# Patient Record
Sex: Male | Born: 1940 | Race: White | Hispanic: No | Marital: Married | Smoking: Former smoker
Health system: Southern US, Community
[De-identification: ages and names within clinical notes are randomized; demographics above are authoritative.]

## PROBLEM LIST (undated history)

## (undated) DIAGNOSIS — I428 Other cardiomyopathies: Secondary | ICD-10-CM

## (undated) DIAGNOSIS — I350 Nonrheumatic aortic (valve) stenosis: Secondary | ICD-10-CM

## (undated) DIAGNOSIS — I447 Left bundle-branch block, unspecified: Secondary | ICD-10-CM

## (undated) DIAGNOSIS — R000 Tachycardia, unspecified: Secondary | ICD-10-CM

## (undated) DIAGNOSIS — K219 Gastro-esophageal reflux disease without esophagitis: Secondary | ICD-10-CM

## (undated) DIAGNOSIS — Z95810 Presence of automatic (implantable) cardiac defibrillator: Secondary | ICD-10-CM

## (undated) DIAGNOSIS — I5022 Chronic systolic (congestive) heart failure: Secondary | ICD-10-CM

## (undated) DIAGNOSIS — J449 Chronic obstructive pulmonary disease, unspecified: Secondary | ICD-10-CM

## (undated) DIAGNOSIS — Z952 Presence of prosthetic heart valve: Secondary | ICD-10-CM

## (undated) DIAGNOSIS — Z87438 Personal history of other diseases of male genital organs: Secondary | ICD-10-CM

## (undated) DIAGNOSIS — I10 Essential (primary) hypertension: Secondary | ICD-10-CM

## (undated) DIAGNOSIS — E785 Hyperlipidemia, unspecified: Secondary | ICD-10-CM

## (undated) DIAGNOSIS — Z87442 Personal history of urinary calculi: Secondary | ICD-10-CM

## (undated) HISTORY — DX: Tachycardia, unspecified: R00.0

## (undated) HISTORY — DX: Chronic obstructive pulmonary disease, unspecified: J44.9

## (undated) HISTORY — DX: Other cardiomyopathies: I42.8

## (undated) HISTORY — DX: Hyperlipidemia, unspecified: E78.5

## (undated) HISTORY — PX: CARDIAC CATHETERIZATION: SHX172

## (undated) HISTORY — DX: Essential (primary) hypertension: I10

## (undated) HISTORY — PX: LEG SURGERY: SHX1003

## (undated) HISTORY — DX: Personal history of other diseases of male genital organs: Z87.438

## (undated) HISTORY — PX: CATARACT EXTRACTION, BILATERAL: SHX1313

## (undated) HISTORY — DX: Chronic systolic (congestive) heart failure: I50.22

## (undated) HISTORY — DX: Left bundle-branch block, unspecified: I44.7

---

## 2008-06-07 ENCOUNTER — Ambulatory Visit: Payer: Self-pay | Admitting: Gastroenterology

## 2010-09-23 HISTORY — PX: CARDIAC CATHETERIZATION: SHX172

## 2010-12-06 ENCOUNTER — Ambulatory Visit: Payer: Self-pay | Admitting: Internal Medicine

## 2011-09-20 ENCOUNTER — Ambulatory Visit: Payer: Self-pay | Admitting: Unknown Physician Specialty

## 2011-09-25 LAB — PATHOLOGY REPORT

## 2012-09-17 ENCOUNTER — Ambulatory Visit: Payer: Self-pay | Admitting: Nurse Practitioner

## 2013-01-18 ENCOUNTER — Ambulatory Visit: Payer: Self-pay | Admitting: Ophthalmology

## 2013-01-18 LAB — POTASSIUM: Potassium: 3.6 mmol/L (ref 3.5–5.1)

## 2013-01-26 ENCOUNTER — Ambulatory Visit: Payer: Self-pay | Admitting: Ophthalmology

## 2013-03-05 ENCOUNTER — Ambulatory Visit: Payer: Self-pay | Admitting: Ophthalmology

## 2013-03-05 LAB — POTASSIUM: Potassium: 3.5 mmol/L (ref 3.5–5.1)

## 2013-03-10 ENCOUNTER — Ambulatory Visit: Payer: Self-pay | Admitting: Ophthalmology

## 2014-02-27 ENCOUNTER — Inpatient Hospital Stay: Payer: Self-pay | Admitting: Internal Medicine

## 2014-02-27 LAB — TROPONIN I: Troponin-I: <0.02 ng/mL

## 2014-02-27 LAB — BASIC METABOLIC PANEL
ANION GAP: 10 (ref 7–16)
BUN: 15 mg/dL (ref 7–18)
CALCIUM: 9.1 mg/dL (ref 8.5–10.1)
Chloride: 96 mmol/L — ABNORMAL LOW (ref 98–107)
Co2: 28 mmol/L (ref 21–32)
Creatinine: 0.85 mg/dL (ref 0.60–1.30)
EGFR (African American): >60
EGFR (Non-African Amer.): >60
GLUCOSE: 112 mg/dL — AB (ref 65–99)
Osmolality: 270 (ref 275–301)
Potassium: 3.2 mmol/L — ABNORMAL LOW (ref 3.5–5.1)
Sodium: 134 mmol/L — ABNORMAL LOW (ref 136–145)

## 2014-02-27 LAB — CBC WITH DIFFERENTIAL/PLATELET
Basophil #: 0 10*3/uL (ref 0.0–0.1)
Basophil %: 0.5 %
EOS PCT: 0.2 %
Eosinophil #: 0 10*3/uL (ref 0.0–0.7)
HCT: 42.4 % (ref 40.0–52.0)
HGB: 14.3 g/dL (ref 13.0–18.0)
Lymphocyte #: 0.7 10*3/uL — ABNORMAL LOW (ref 1.0–3.6)
Lymphocyte %: 7.2 %
MCH: 30.9 pg (ref 26.0–34.0)
MCHC: 33.8 g/dL (ref 32.0–36.0)
MCV: 91 fL (ref 80–100)
MONOS PCT: 11.6 %
Monocyte #: 1.2 x10 3/mm — ABNORMAL HIGH (ref 0.2–1.0)
Neutrophil #: 8.2 10*3/uL — ABNORMAL HIGH (ref 1.4–6.5)
Neutrophil %: 80.5 %
Platelet: 215 10*3/uL (ref 150–440)
RBC: 4.64 10*6/uL (ref 4.40–5.90)
RDW: 14 % (ref 11.5–14.5)
WBC: 10.1 10*3/uL (ref 3.8–10.6)

## 2014-02-27 LAB — PRO B NATRIURETIC PEPTIDE: B-Type Natriuretic Peptide: 5312 pg/mL — ABNORMAL HIGH (ref 0–125)

## 2014-02-28 LAB — BASIC METABOLIC PANEL
ANION GAP: 8 (ref 7–16)
BUN: 15 mg/dL (ref 7–18)
CALCIUM: 8.7 mg/dL (ref 8.5–10.1)
CHLORIDE: 99 mmol/L (ref 98–107)
CREATININE: 0.87 mg/dL (ref 0.60–1.30)
Co2: 27 mmol/L (ref 21–32)
EGFR (Non-African Amer.): >60
GLUCOSE: 182 mg/dL — AB (ref 65–99)
Osmolality: 274 (ref 275–301)
POTASSIUM: 3.4 mmol/L — AB (ref 3.5–5.1)
Sodium: 134 mmol/L — ABNORMAL LOW (ref 136–145)

## 2014-02-28 LAB — CBC WITH DIFFERENTIAL/PLATELET
Basophil #: 0 10*3/uL (ref 0.0–0.1)
Basophil %: 0 %
Eosinophil #: 0 10*3/uL (ref 0.0–0.7)
Eosinophil %: 0 %
HCT: 38 % — AB (ref 40.0–52.0)
HGB: 12.6 g/dL — AB (ref 13.0–18.0)
LYMPHS ABS: 0.2 10*3/uL — AB (ref 1.0–3.6)
LYMPHS PCT: 3 %
MCH: 30.3 pg (ref 26.0–34.0)
MCHC: 33.1 g/dL (ref 32.0–36.0)
MCV: 92 fL (ref 80–100)
MONO ABS: 0.1 x10 3/mm — AB (ref 0.2–1.0)
Monocyte %: 1 %
NEUTROS ABS: 7.1 10*3/uL — AB (ref 1.4–6.5)
Neutrophil %: 96 %
PLATELETS: 193 10*3/uL (ref 150–440)
RBC: 4.15 10*6/uL — AB (ref 4.40–5.90)
RDW: 14 % (ref 11.5–14.5)
WBC: 7.4 10*3/uL (ref 3.8–10.6)

## 2014-02-28 LAB — LIPID PANEL
CHOLESTEROL: 117 mg/dL (ref 0–200)
HDL Cholesterol: 44 mg/dL (ref 40–60)
LDL CHOLESTEROL, CALC: 60 mg/dL (ref 0–100)
TRIGLYCERIDES: 65 mg/dL (ref 0–200)
VLDL Cholesterol, Calc: 13 mg/dL (ref 5–40)

## 2014-02-28 LAB — MAGNESIUM: Magnesium: 2.2 mg/dL

## 2014-02-28 LAB — TSH: Thyroid Stimulating Horm: 0.171 u[IU]/mL — ABNORMAL LOW

## 2014-03-02 LAB — EXPECTORATED SPUTUM ASSESSMENT W GRAM STAIN, RFLX TO RESP C

## 2014-03-12 ENCOUNTER — Inpatient Hospital Stay: Payer: Self-pay | Admitting: Student

## 2014-03-12 LAB — CBC
HCT: 39.6 % — ABNORMAL LOW (ref 40.0–52.0)
HGB: 13 g/dL (ref 13.0–18.0)
MCH: 30.4 pg (ref 26.0–34.0)
MCHC: 33 g/dL (ref 32.0–36.0)
MCV: 92 fL (ref 80–100)
Platelet: 208 10*3/uL (ref 150–440)
RBC: 4.29 10*6/uL — AB (ref 4.40–5.90)
RDW: 14.4 % (ref 11.5–14.5)
WBC: 10.8 10*3/uL — ABNORMAL HIGH (ref 3.8–10.6)

## 2014-03-12 LAB — CK TOTAL AND CKMB (NOT AT ARMC)
CK, Total: 69 U/L
CK, Total: 56 U/L
CK-MB: 1.9 ng/mL (ref 0.5–3.6)
CK-MB: 1.9 ng/mL (ref 0.5–3.6)

## 2014-03-12 LAB — BASIC METABOLIC PANEL
Anion Gap: 6 — ABNORMAL LOW (ref 7–16)
BUN: 22 mg/dL — ABNORMAL HIGH (ref 7–18)
CHLORIDE: 103 mmol/L (ref 98–107)
Calcium, Total: 8.7 mg/dL (ref 8.5–10.1)
Co2: 29 mmol/L (ref 21–32)
Creatinine: 1.12 mg/dL (ref 0.60–1.30)
Glucose: 122 mg/dL — ABNORMAL HIGH (ref 65–99)
Osmolality: 280 (ref 275–301)
Potassium: 3.7 mmol/L (ref 3.5–5.1)
Sodium: 138 mmol/L (ref 136–145)

## 2014-03-12 LAB — TROPONIN I
Troponin-I: 0.04 ng/mL
Troponin-I: 0.04 ng/mL

## 2014-03-12 LAB — PRO B NATRIURETIC PEPTIDE: B-TYPE NATIURETIC PEPTID: 10232 pg/mL — AB (ref 0–125)

## 2014-03-13 LAB — CBC WITH DIFFERENTIAL/PLATELET
BASOS ABS: 0 10*3/uL (ref 0.0–0.1)
Basophil %: 0.3 %
EOS ABS: 0 10*3/uL (ref 0.0–0.7)
Eosinophil %: 0 %
HCT: 37.8 % — ABNORMAL LOW (ref 40.0–52.0)
HGB: 12.3 g/dL — AB (ref 13.0–18.0)
Lymphocyte #: 0.4 10*3/uL — ABNORMAL LOW (ref 1.0–3.6)
Lymphocyte %: 5.6 %
MCH: 29.8 pg (ref 26.0–34.0)
MCHC: 32.6 g/dL (ref 32.0–36.0)
MCV: 92 fL (ref 80–100)
MONOS PCT: 0.9 %
Monocyte #: 0.1 x10 3/mm — ABNORMAL LOW (ref 0.2–1.0)
Neutrophil #: 6.2 10*3/uL (ref 1.4–6.5)
Neutrophil %: 93.2 %
Platelet: 193 10*3/uL (ref 150–440)
RBC: 4.13 10*6/uL — ABNORMAL LOW (ref 4.40–5.90)
RDW: 14.2 % (ref 11.5–14.5)
WBC: 6.6 10*3/uL (ref 3.8–10.6)

## 2014-03-13 LAB — BASIC METABOLIC PANEL
ANION GAP: 7 (ref 7–16)
BUN: 22 mg/dL — AB (ref 7–18)
CALCIUM: 8.1 mg/dL — AB (ref 8.5–10.1)
CO2: 28 mmol/L (ref 21–32)
Chloride: 103 mmol/L (ref 98–107)
Creatinine: 1.07 mg/dL (ref 0.60–1.30)
EGFR (African American): >60
EGFR (Non-African Amer.): >60
Glucose: 173 mg/dL — ABNORMAL HIGH (ref 65–99)
Osmolality: 283 (ref 275–301)
Potassium: 3.3 mmol/L — ABNORMAL LOW (ref 3.5–5.1)
Sodium: 138 mmol/L (ref 136–145)

## 2014-03-13 LAB — URINALYSIS, COMPLETE
BILIRUBIN, UR: NEGATIVE
BLOOD: NEGATIVE
Glucose,UR: NEGATIVE mg/dL (ref 0–75)
Hyaline Cast: 5
KETONE: NEGATIVE
LEUKOCYTE ESTERASE: NEGATIVE
NITRITE: NEGATIVE
Ph: 5 (ref 4.5–8.0)
Protein: NEGATIVE
RBC,UR: 1 /HPF (ref 0–5)
SPECIFIC GRAVITY: 1.012 (ref 1.003–1.030)
Squamous Epithelial: <1

## 2014-03-13 LAB — TSH: THYROID STIMULATING HORM: 0.455 u[IU]/mL

## 2014-03-13 LAB — CK TOTAL AND CKMB (NOT AT ARMC)
CK, Total: 52 U/L
CK-MB: 2.3 ng/mL (ref 0.5–3.6)

## 2014-03-13 LAB — TROPONIN I: Troponin-I: 0.04 ng/mL

## 2014-03-14 LAB — BASIC METABOLIC PANEL
Anion Gap: 9 (ref 7–16)
BUN: 24 mg/dL — AB (ref 7–18)
CALCIUM: 8.9 mg/dL (ref 8.5–10.1)
Chloride: 100 mmol/L (ref 98–107)
Co2: 29 mmol/L (ref 21–32)
Creatinine: 1.21 mg/dL (ref 0.60–1.30)
GFR CALC NON AF AMER: 59 — AB
Glucose: 152 mg/dL — ABNORMAL HIGH (ref 65–99)
OSMOLALITY: 283 (ref 275–301)
Potassium: 3.7 mmol/L (ref 3.5–5.1)
Sodium: 138 mmol/L (ref 136–145)

## 2014-03-16 LAB — BASIC METABOLIC PANEL
Anion Gap: 8 (ref 7–16)
BUN: 25 mg/dL — AB (ref 7–18)
CHLORIDE: 103 mmol/L (ref 98–107)
CO2: 30 mmol/L (ref 21–32)
Calcium, Total: 9 mg/dL (ref 8.5–10.1)
Creatinine: 0.94 mg/dL (ref 0.60–1.30)
GLUCOSE: 101 mg/dL — AB (ref 65–99)
Osmolality: 286 (ref 275–301)
POTASSIUM: 3.5 mmol/L (ref 3.5–5.1)
SODIUM: 141 mmol/L (ref 136–145)

## 2014-03-16 LAB — PRO B NATRIURETIC PEPTIDE: B-Type Natriuretic Peptide: 12053 pg/mL — ABNORMAL HIGH (ref 0–125)

## 2014-03-17 LAB — CULTURE, BLOOD (SINGLE)

## 2014-03-18 ENCOUNTER — Encounter: Payer: Self-pay | Admitting: *Deleted

## 2014-03-18 LAB — BASIC METABOLIC PANEL
ANION GAP: 7 (ref 7–16)
BUN: 20 mg/dL — ABNORMAL HIGH (ref 7–18)
CHLORIDE: 103 mmol/L (ref 98–107)
Calcium, Total: 8.6 mg/dL (ref 8.5–10.1)
Co2: 31 mmol/L (ref 21–32)
Creatinine: 0.88 mg/dL (ref 0.60–1.30)
EGFR (Non-African Amer.): >60
Glucose: 151 mg/dL — ABNORMAL HIGH (ref 65–99)
Osmolality: 287 (ref 275–301)
Potassium: 4.3 mmol/L (ref 3.5–5.1)
Sodium: 141 mmol/L (ref 136–145)

## 2014-03-18 LAB — MAGNESIUM: Magnesium: 2 mg/dL

## 2014-03-21 ENCOUNTER — Encounter (HOSPITAL_COMMUNITY): Payer: Self-pay | Admitting: Pharmacy Technician

## 2014-03-21 ENCOUNTER — Encounter: Payer: Self-pay | Admitting: Cardiovascular Disease

## 2014-03-21 ENCOUNTER — Ambulatory Visit (INDEPENDENT_AMBULATORY_CARE_PROVIDER_SITE_OTHER): Payer: Medicare Other | Admitting: Cardiovascular Disease

## 2014-03-21 ENCOUNTER — Encounter (INDEPENDENT_AMBULATORY_CARE_PROVIDER_SITE_OTHER): Payer: Self-pay

## 2014-03-21 VITALS — BP 84/70 | HR 109 | Ht 70.0 in | Wt 212.5 lb

## 2014-03-21 DIAGNOSIS — I447 Left bundle-branch block, unspecified: Secondary | ICD-10-CM

## 2014-03-21 DIAGNOSIS — I5022 Chronic systolic (congestive) heart failure: Secondary | ICD-10-CM

## 2014-03-21 DIAGNOSIS — I498 Other specified cardiac arrhythmias: Secondary | ICD-10-CM

## 2014-03-21 DIAGNOSIS — R000 Tachycardia, unspecified: Secondary | ICD-10-CM

## 2014-03-21 DIAGNOSIS — R0602 Shortness of breath: Secondary | ICD-10-CM

## 2014-03-21 MED ORDER — CARVEDILOL 3.125 MG PO TABS: 3.1250 mg | ORAL_TABLET | Freq: Two times a day (BID) | ORAL | Status: DC

## 2014-03-21 MED ORDER — SPIRONOLACTONE 25 MG PO TABS: 25.0000 mg | ORAL_TABLET | Freq: Every day | ORAL | Status: DC

## 2014-03-21 NOTE — Patient Instructions (Addendum)
Please STOP diltiazem  Please start carvedilol 3.125mg  twice daily Please start spironolactone 25mg  once daily  Your physician has requested that you have a cardiac catheterization. Cardiac catheterization is used to diagnose and/or treat various heart conditions. Doctors may recommend this procedure for a number of different reasons. The most common reason is to evaluate chest pain. Chest pain can be a symptom of coronary artery disease (CAD), and cardiac catheterization can show whether plaque is narrowing or blocking your heart's arteries. This procedure is also used to evaluate the valves, as well as measure the blood flow and oxygen levels in different parts of your heart. For further information please visit [email or web address] Please follow instruction sheet, as given.  Please arrive at Short Stay at [facility] at 6:30am on Wednesday, You are scheduled for a Cardiac Catheterization on Wed, July 1 with [name].  Please arrive at the [facility] Entrance "A" of [facility] ([address] [place]) at 6:30am on the day of your procedure.  1. Nothing to eat or drink after midnight. 2. You should take your medication as usual with a sip of water; this includes your aspirin and Plavix / Effient / Brilinta. 3. Plan for one night stay - bring personal belongings (i.e. Toothpaste, toothbrush, etc.). 4. Bring a current list of your medications and current insurance cards. 5. Must have a responsible person to drive you home. 6. Someone must be with you for the first 24 hours after you arrive home. 7. Please wear clothes that are easy to get on and off and wear slip-on shoes.  * Special note:  Every effort is made to have your procedure done on time.  Occasionally there are emergencies that present themselves at the hospital that may cause delays.  Please be patient if a delay does occur.July 1

## 2014-03-22 ENCOUNTER — Encounter: Payer: Self-pay | Admitting: Cardiovascular Disease

## 2014-03-22 DIAGNOSIS — I5022 Chronic systolic (congestive) heart failure: Secondary | ICD-10-CM | POA: Insufficient documentation

## 2014-03-22 DIAGNOSIS — R000 Tachycardia, unspecified: Secondary | ICD-10-CM | POA: Insufficient documentation

## 2014-03-22 DIAGNOSIS — I447 Left bundle-branch block, unspecified: Secondary | ICD-10-CM | POA: Insufficient documentation

## 2014-03-22 LAB — CBC WITH DIFFERENTIAL
BASOS ABS: 0 10*3/uL (ref 0.0–0.2)
Basos: 0 %
Eos: 0 %
Eosinophils Absolute: 0 10*3/uL (ref 0.0–0.4)
HCT: 41.1 % (ref 37.5–51.0)
Hemoglobin: 13.7 g/dL (ref 12.6–17.7)
IMMATURE GRANULOCYTES: 0 %
Immature Grans (Abs): 0 10*3/uL (ref 0.0–0.1)
LYMPHS ABS: 0.7 10*3/uL (ref 0.7–3.1)
Lymphs: 9 %
MCH: 29.8 pg (ref 26.6–33.0)
MCHC: 33.3 g/dL (ref 31.5–35.7)
MCV: 89 fL (ref 79–97)
MONOCYTES: 4 %
Monocytes Absolute: 0.3 10*3/uL (ref 0.1–0.9)
Neutrophils Absolute: 6.8 10*3/uL (ref 1.4–7.0)
Neutrophils Relative %: 87 %
PLATELETS: 244 10*3/uL (ref 150–379)
RBC: 4.6 x10E6/uL (ref 4.14–5.80)
RDW: 14.7 % (ref 12.3–15.4)
WBC: 7.9 10*3/uL (ref 3.4–10.8)

## 2014-03-22 LAB — BASIC METABOLIC PANEL
BUN / CREAT RATIO: 21 (ref 10–22)
BUN: 16 mg/dL (ref 8–27)
CHLORIDE: 98 mmol/L (ref 97–108)
CO2: 25 mmol/L (ref 18–29)
Calcium: 8.7 mg/dL (ref 8.6–10.2)
Creatinine, Ser: 0.76 mg/dL (ref 0.76–1.27)
GFR calc Af Amer: 104 mL/min/{1.73_m2} (ref >59)
GFR calc non Af Amer: 90 mL/min/{1.73_m2} (ref >59)
Glucose: 132 mg/dL — ABNORMAL HIGH (ref 65–99)
Potassium: 5.1 mmol/L (ref 3.5–5.2)
Sodium: 136 mmol/L (ref 134–144)

## 2014-03-22 LAB — PROTIME-INR
INR: 1.1 (ref 0.8–1.2)
Prothrombin Time: 10.9 s (ref 9.1–12.0)

## 2014-03-22 NOTE — Assessment & Plan Note (Signed)
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 I stop diltiazem and added carvedilol. If he does not tolerate carvedilol due to his lung disease, I will consider bisoprolol. The other option would be Ivabradine especially with low blood pressure.

## 2014-03-22 NOTE — Assessment & Plan Note (Addendum)
Mr. 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 Unfortunately, he is currently New [place] Heart Association class IV with symptoms highly suggestive of low cardiac output. I had a prolonged discussion with the patient and his family about the seriousness of his illness. It is very concerning that he is tachycardic and mildly hypotensive. A calcium channel blocker is relatively contraindicated given the degree of LV systolic dysfunction. He is currently on diltiazem. He did not tolerate metoprolol. I stopped diltiazem and added carvedilol 3.125 mg twice daily. I also added spironolactone 25 mg once daily. I recommend urgent evaluation with a right and left cardiac catheterization to exclude coronary artery disease and evaluate filling pressures and cardiac output. Risks and benefits were explained.  He might need to be admitted after the procedure based on his numbers. He might require advanced heart failure treatment. I will consider a heart failure consult.

## 2014-03-22 NOTE — Assessment & Plan Note (Signed)
The patient is likely a candidate for biventricular ICD placement. Ejection fraction was 15% in March. However, he has not been on optimal medical therapy. Given his advanced symptoms though, we might need to expedite this.

## 2014-03-22 NOTE — Progress Notes (Signed)
Primary care physician Dr. Ulanda Edison:   HPI  This is a pleasant 73 year old male who is transferring cardiovascular care from [facility]. He used to see Dr. Gwen Pounds and has seen him for many years initially for sinus tachycardia with moderate LV systolic dysfunction. He had cardiac catheterization done in March of 2012 which showed normal coronary arteries with ejection fraction of 35% and mild aortic stenosis. He had no significant symptoms of heart failure at that time he has known history of COPD and previous tobacco use. Other medical problems include hypertension, hyperlipidemia and recent left bundle branch block. He underwent an echocardiogram in March of this year which showed worsening of LV systolic function with an ejection fraction of 15%. Shortly after that, the patient started experiencing significant shortness of breath and cough. He had 2 recent hospitalization at [facility] for fatigue, dyspnea and cough. He was initially treated for COPD exacerbation. He was started on small dose metoprolol and Lasix but she reports getting worse after that and was hospitalized for hypotension which required treatment with levofed. He was discharged on digoxin and diltiazem to control his sinus tachycardia. He continues to have significant dyspnea at rest with orthopnea and poor sleep at night. He feels that he has no energy and has not been able to do any physical activities.   Allergies  Allergen Reactions  . Lisinopril Hives  . Metoprolol Nausea And Vomiting  . Norvasc [Amlodipine Besylate]     unknown     No current outpatient prescriptions on file prior to visit.   No current facility-administered medications on file prior to visit.     Past Medical History  Diagnosis Date  . History of BPH   . COPD (chronic obstructive pulmonary disease)   . Hypertension   . Hyperlipidemia   . Chronic back pain   . Pneumonia   . Heart murmur   . Chronic systolic heart failure     Ejection fraction of  15%  . Sinus tachycardia   . Left bundle branch block      Past Surgical History  Procedure Laterality Date  . Cardiac catheterization  2012    [facility]     Family History  Problem Relation Age of Onset  . Heart disease Father   . Heart failure Father   . Hypertension Mother   . Hyperlipidemia Mother      History   Social History  . Marital Status: Married    Spouse Name: N/A    Number of Children: N/A  . Years of Education: N/A   Occupational History  . Not on file.   Social History Main Topics  . Smoking status: Former Smoker -- 0 years    Types: Cigarettes  . Smokeless tobacco: Former Neurosurgeon    Types: Snuff  . Alcohol Use: Yes     Comment: occasional  . Drug Use: No  . Sexual Activity: Not on file   Other Topics Concern  . Not on file   Social History Narrative  . No narrative on file     ROS A 10 point review of system was performed. It is negative other than that mentioned in the history of present illness.   PHYSICAL EXAM   BP 84/70  Pulse 109  Ht 5\' 10"  (1.778 m)  Wt 212 lb 8 oz (96.389 kg)  BMI 30.49 kg/m2 Constitutional: He is oriented to person, place, and time. He appears well-developed and well-nourished. No distress.  HENT: No nasal discharge.  Head: Normocephalic  and atraumatic.  Eyes: Pupils are equal and round.  No discharge. Neck: Normal range of motion. Neck supple. No JVD present. No thyromegaly present.  Cardiovascular: Normal rate, regular rhythm, normal heart sounds. Exam reveals no gallop and no friction rub. No murmur heard.  Pulmonary/Chest: Effort normal and breath sounds normal. No stridor. No respiratory distress. He has no wheezes. He has no rales. He exhibits no tenderness.  Abdominal: Soft. Bowel sounds are normal. He exhibits no distension. There is no tenderness. There is no rebound and no guarding.  Musculoskeletal: Normal range of motion. He exhibits no edema and no tenderness.  Neurological: He is alert and oriented  to person, place, and time. Coordination normal.  Skin: Skin is warm and dry. No rash noted. He is not diaphoretic. No erythema. No pallor.  Psychiatric: He has a normal mood and affect. His behavior is normal. Judgment and thought content normal.       EKG:   ASSESSMENT AND PLAN   

## 2014-03-23 ENCOUNTER — Ambulatory Visit (HOSPITAL_COMMUNITY)
Admission: RE | Admit: 2014-03-23 | Discharge: 2014-03-23 | Disposition: A | Discharge status: Home / Self Care | Payer: Medicare Other | Source: Ambulatory Visit | Attending: Cardiovascular Disease | Admitting: Cardiovascular Disease

## 2014-03-23 ENCOUNTER — Encounter (HOSPITAL_COMMUNITY)
Admission: RE | Disposition: A | Discharge status: Home / Self Care | Payer: Self-pay | Source: Ambulatory Visit | Attending: Cardiovascular Disease

## 2014-03-23 DIAGNOSIS — M549 Dorsalgia, unspecified: Secondary | ICD-10-CM | POA: Insufficient documentation

## 2014-03-23 DIAGNOSIS — I447 Left bundle-branch block, unspecified: Secondary | ICD-10-CM | POA: Insufficient documentation

## 2014-03-23 DIAGNOSIS — I428 Other cardiomyopathies: Secondary | ICD-10-CM | POA: Insufficient documentation

## 2014-03-23 DIAGNOSIS — J4489 Other specified chronic obstructive pulmonary disease: Secondary | ICD-10-CM | POA: Insufficient documentation

## 2014-03-23 DIAGNOSIS — I10 Essential (primary) hypertension: Secondary | ICD-10-CM | POA: Insufficient documentation

## 2014-03-23 DIAGNOSIS — R011 Cardiac murmur, unspecified: Secondary | ICD-10-CM | POA: Insufficient documentation

## 2014-03-23 DIAGNOSIS — G8929 Other chronic pain: Secondary | ICD-10-CM | POA: Insufficient documentation

## 2014-03-23 DIAGNOSIS — I509 Heart failure, unspecified: Secondary | ICD-10-CM

## 2014-03-23 DIAGNOSIS — E785 Hyperlipidemia, unspecified: Secondary | ICD-10-CM | POA: Insufficient documentation

## 2014-03-23 DIAGNOSIS — J449 Chronic obstructive pulmonary disease, unspecified: Secondary | ICD-10-CM | POA: Insufficient documentation

## 2014-03-23 DIAGNOSIS — I5022 Chronic systolic (congestive) heart failure: Secondary | ICD-10-CM | POA: Insufficient documentation

## 2014-03-23 DIAGNOSIS — Z87891 Personal history of nicotine dependence: Secondary | ICD-10-CM | POA: Insufficient documentation

## 2014-03-23 HISTORY — PX: LEFT AND RIGHT HEART CATHETERIZATION WITH CORONARY ANGIOGRAM: SHX5449

## 2014-03-23 LAB — POCT I-STAT 3, VENOUS BLOOD GAS (G3P V)
Acid-Base Excess: 4 mmol/L — ABNORMAL HIGH (ref 0.0–2.0)
Bicarbonate: 29.8 mEq/L — ABNORMAL HIGH (ref 20.0–24.0)
O2 Saturation: 68 %
PH VEN: 7.416 — AB (ref 7.250–7.300)
PO2 VEN: 35 mmHg (ref 30.0–45.0)
TCO2: 31 mmol/L (ref 0–100)
pCO2, Ven: 46.3 mmHg (ref 45.0–50.0)

## 2014-03-23 LAB — POCT I-STAT 3, ART BLOOD GAS (G3+)
Acid-Base Excess: 4 mmol/L — ABNORMAL HIGH (ref 0.0–2.0)
Bicarbonate: 29.2 mEq/L — ABNORMAL HIGH (ref 20.0–24.0)
O2 Saturation: 94 %
PH ART: 7.431 (ref 7.350–7.450)
PO2 ART: 70 mmHg — AB (ref 80.0–100.0)
TCO2: 30 mmol/L (ref 0–100)
pCO2 arterial: 43.8 mmHg (ref 35.0–45.0)

## 2014-03-23 SURGERY — LEFT AND RIGHT HEART CATHETERIZATION WITH CORONARY ANGIOGRAM
Anesthesia: LOCAL

## 2014-03-23 MED ORDER — ASPIRIN 81 MG PO CHEW
81.0000 mg | CHEWABLE_TABLET | ORAL | Status: AC
  Administered 2014-03-23: 81 mg via ORAL

## 2014-03-23 MED ORDER — VERAPAMIL HCL 2.5 MG/ML IV SOLN
INTRAVENOUS | Status: AC
  Filled 2014-03-23: qty 2

## 2014-03-23 MED ORDER — ASPIRIN 81 MG PO CHEW
CHEWABLE_TABLET | ORAL | Status: AC
  Filled 2014-03-23: qty 1

## 2014-03-23 MED ORDER — LIDOCAINE HCL (PF) 1 % IJ SOLN
INTRAMUSCULAR | Status: AC
  Filled 2014-03-23: qty 30

## 2014-03-23 MED ORDER — SODIUM CHLORIDE 0.9 % IJ SOLN: 3.0000 mL | Freq: Two times a day (BID) | INTRAMUSCULAR | Status: DC

## 2014-03-23 MED ORDER — SODIUM CHLORIDE 0.9 % IJ SOLN: 3.0000 mL | INTRAMUSCULAR | Status: DC | PRN

## 2014-03-23 MED ORDER — SODIUM CHLORIDE 0.9 % IV SOLN: INTRAVENOUS | Status: AC

## 2014-03-23 MED ORDER — HEPARIN (PORCINE) IN NACL 2-0.9 UNIT/ML-% IJ SOLN
INTRAMUSCULAR | Status: AC
  Filled 2014-03-23: qty 1500

## 2014-03-23 MED ORDER — SODIUM CHLORIDE 0.9 % IV SOLN: 250.0000 mL | INTRAVENOUS | Status: DC | PRN

## 2014-03-23 MED ORDER — HEPARIN SODIUM (PORCINE) 1000 UNIT/ML IJ SOLN
INTRAMUSCULAR | Status: AC
  Filled 2014-03-23: qty 1

## 2014-03-23 MED ORDER — SODIUM CHLORIDE 0.9 % IV SOLN
INTRAVENOUS | Status: DC
  Administered 2014-03-23: 1000 mL via INTRAVENOUS

## 2014-03-23 NOTE — H&P (View-Only) (Signed)
Primary care physician Dr. Ulanda Edison:   HPI  This is a pleasant 73 year old male who is transferring cardiovascular care from [facility]. He used to see Dr. Gwen Pounds and has seen him for many years initially for sinus tachycardia with moderate LV systolic dysfunction. He had cardiac catheterization done in March of 2012 which showed normal coronary arteries with ejection fraction of 35% and mild aortic stenosis. He had no significant symptoms of heart failure at that time he has known history of COPD and previous tobacco use. Other medical problems include hypertension, hyperlipidemia and recent left bundle branch block. He underwent an echocardiogram in March of this year which showed worsening of LV systolic function with an ejection fraction of 15%. Shortly after that, the patient started experiencing significant shortness of breath and cough. He had 2 recent hospitalization at [facility] for fatigue, dyspnea and cough. He was initially treated for COPD exacerbation. He was started on small dose metoprolol and Lasix but she reports getting worse after that and was hospitalized for hypotension which required treatment with levofed. He was discharged on digoxin and diltiazem to control his sinus tachycardia. He continues to have significant dyspnea at rest with orthopnea and poor sleep at night. He feels that he has no energy and has not been able to do any physical activities.   Allergies  Allergen Reactions  . Lisinopril Hives  . Metoprolol Nausea And Vomiting  . Norvasc [Amlodipine Besylate]     unknown     No current outpatient prescriptions on file prior to visit.   No current facility-administered medications on file prior to visit.     Past Medical History  Diagnosis Date  . History of BPH   . COPD (chronic obstructive pulmonary disease)   . Hypertension   . Hyperlipidemia   . Chronic back pain   . Pneumonia   . Heart murmur   . Chronic systolic heart failure     Ejection fraction of  15%  . Sinus tachycardia   . Left bundle branch block      Past Surgical History  Procedure Laterality Date  . Cardiac catheterization  2012    [facility]     Family History  Problem Relation Age of Onset  . Heart disease Father   . Heart failure Father   . Hypertension Mother   . Hyperlipidemia Mother      History   Social History  . Marital Status: Married    Spouse Name: N/A    Number of Children: N/A  . Years of Education: N/A   Occupational History  . Not on file.   Social History Main Topics  . Smoking status: Former Smoker -- 0 years    Types: Cigarettes  . Smokeless tobacco: Former Neurosurgeon    Types: Snuff  . Alcohol Use: Yes     Comment: occasional  . Drug Use: No  . Sexual Activity: Not on file   Other Topics Concern  . Not on file   Social History Narrative  . No narrative on file     ROS A 10 point review of system was performed. It is negative other than that mentioned in the history of present illness.   PHYSICAL EXAM   BP 84/70  Pulse 109  Ht 5\' 10"  (1.778 m)  Wt 212 lb 8 oz (96.389 kg)  BMI 30.49 kg/m2 Constitutional: He is oriented to person, place, and time. He appears well-developed and well-nourished. No distress.  HENT: No nasal discharge.  Head: Normocephalic  and atraumatic.  Eyes: Pupils are equal and round.  No discharge. Neck: Normal range of motion. Neck supple. No JVD present. No thyromegaly present.  Cardiovascular: Normal rate, regular rhythm, normal heart sounds. Exam reveals no gallop and no friction rub. No murmur heard.  Pulmonary/Chest: Effort normal and breath sounds normal. No stridor. No respiratory distress. He has no wheezes. He has no rales. He exhibits no tenderness.  Abdominal: Soft. Bowel sounds are normal. He exhibits no distension. There is no tenderness. There is no rebound and no guarding.  Musculoskeletal: Normal range of motion. He exhibits no edema and no tenderness.  Neurological: He is alert and oriented  to person, place, and time. Coordination normal.  Skin: Skin is warm and dry. No rash noted. He is not diaphoretic. No erythema. No pallor.  Psychiatric: He has a normal mood and affect. His behavior is normal. Judgment and thought content normal.       EKG:   ASSESSMENT AND PLAN

## 2014-03-23 NOTE — CV Procedure (Signed)
   Cardiac Catheterization Procedure Note  Name: Benjamin Frost MRN: [id number] DOB: [birth year]  Procedure: Right Heart Cath, Left Heart Cath, Selective Coronary Angiography, LV angiography  Indication: Congestive heart failure with severely reduced LV systolic function. No sedation was given. Contrast volume: 60 mL      Procedural Details: The pre-existing IV in the right antecubital vein was exchanged under sterile fashion into a 6 French slender sheath. Right heart catheterization was performed with a 5 [place] balloontipped catheter with serial measurement of pressures. Cardiac output was calculated by the Fick method.  The right wrist was prepped, draped, and anesthetized with 1% lidocaine. Using the modified Seldinger technique, a 5 French sheath was introduced into the right radial artery. 3 mg of verapamil was administered through the sheath, weight-based unfractionated heparin was administered intravenously. A Jackie catheter was used for selective coronary angiography. A pigtail catheter was used for left ventriculography. Catheter exchanges were performed over an exchange length guidewire. There were no immediate procedural complications. A TR band was used for radial hemostasis at the completion of the procedure.  The patient was transferred to the post catheterization recovery area for further monitoing   Procedural Findings: Hemodynamics RA 11 mmHg RV 35/4 mmHg PA 35/17 mmHg PCWP 18 mmHg LV 107/5 mmHg . LVEDP: 14 mmHg AO 100/63 mmHg  Oxygen saturations: PA 68% AO 94%  Cardiac Output (Fick) 5.79  Cardiac Index (Fick) 2.74    Pulmonary vascular resistance (PVR): <1 Woods units.  Coronary angiography: Coronary dominance: Right   Left Main:  Normal  Left Anterior Descending (LAD):  Normal in size with no significant disease.   1st diagonal (D1):   Large in size with no significant disease.  2nd diagonal (D2):  Small in size with no significant disease.   3rd  diagonal (D3):  Small in size.   Circumflex (LCx):  Normal in size and nondominant. The vessel has no significant disease.  1st obtuse marginal:  Very small in size.   2nd obtuse marginal:   normal in size with no significant disease.   3rd obtuse marginal:   normal in size with no significant disease.    Ramus Intermedius:   small in size with no significant disease.   Right Coronary Artery: Normal in size and dominant. There are minor irregularities in the midsegment.   Posterior descending artery:  normal in size with no significant disease.   Posterior AV segment:  no significant disease.    Left ventriculography: Left ventricular systolic function is  severely reduced  , LVEF is estimated at  10-15%  %, there is  no significant mitral regurgitation   Final Conclusions:    1. Mildly elevated filling pressures with normal cardiac output.   2. No evidence of obstructive coronary artery disease with only minor irregularities. 3. Severely reduced LV systolic function.  Recommendations:   continue medical therapy for congestive heart failure nonischemic cardiomyopathy.   Lorine Bears MD, [facility] 03/23/2014, 9:38 AM

## 2014-03-23 NOTE — Discharge Instructions (Signed)
Radial Site Care °Refer to this sheet in the next few weeks. These instructions provide you with information on caring for yourself after your procedure. Your caregiver may also give you more specific instructions. Your treatment has been planned according to current medical practices, but problems sometimes occur. Call your caregiver if you have any problems or questions after your procedure. °HOME CARE INSTRUCTIONS °· You may shower the day after the procedure. Remove the bandage (dressing) and gently wash the site with plain soap and water. Gently pat the site dry. °· Do not apply powder or lotion to the site. °· Do not submerge the affected site in water for 3 to 5 days. °· Inspect the site at least twice daily. °· Do not flex or bend the affected arm for 24 hours. °· No lifting over 5 pounds (2.3 kg) for 5 days after your procedure. °· Do not drive home if you are discharged the same day of the procedure. Have someone else drive you. °· You may drive 24 hours after the procedure unless otherwise instructed by your caregiver. °· Do not operate machinery or power tools for 24 hours. °· A responsible adult should be with you for the first 24 hours after you arrive home. °What to expect: °· Any bruising will usually fade within 1 to 2 weeks. °· Blood that collects in the tissue (hematoma) may be painful to the touch. It should usually decrease in size and tenderness within 1 to 2 weeks. °SEEK IMMEDIATE MEDICAL CARE IF: °· You have unusual pain at the radial site. °· You have redness, warmth, swelling, or pain at the radial site. °· You have drainage (other than a small amount of blood on the dressing). °· You have chills. °· You have a fever or persistent symptoms for more than 72 hours. °· You have a fever and your symptoms suddenly get worse. °· Your arm becomes pale, cool, tingly, or numb. °· You have heavy bleeding from the site. Hold pressure on the site. °Document Released: 10/12/2010 Document Revised:  12/02/2011 Document Reviewed: 10/12/2010 °ExitCare® Patient Information ©2015 [employer]. This information is not intended to replace advice given to you by your health care provider. Make sure you discuss any questions you have with your health care provider. ° °

## 2014-03-23 NOTE — Interval H&P Note (Signed)
Cath Lab Visit (complete for each Cath Lab visit)  Clinical Evaluation Leading to the Procedure:   ACS: No.  Non-ACS:    Anginal Classification: CCS IV  Anti-ischemic medical therapy: Minimal Therapy (1 class of medications)  Non-Invasive Test Results: No non-invasive testing performed  Prior CABG: No previous CABG      History and Physical Interval Note:  03/23/2014 8:50 AM  Benjamin Frost  has presented today for surgery, with the diagnosis of shortness of breath  The various methods of treatment have been discussed with the patient and family. After consideration of risks, benefits and other options for treatment, the patient has consented to  Procedure(s): LEFT AND RIGHT HEART CATHETERIZATION WITH CORONARY ANGIOGRAM (N/A) as a surgical intervention .  The patient's history has been reviewed, patient examined, no change in status, stable for surgery.  I have reviewed the patient's chart and labs.  Questions were answered to the patient's satisfaction.     Lorine Bears

## 2014-03-29 ENCOUNTER — Encounter: Payer: Self-pay | Admitting: Cardiovascular Disease

## 2014-03-29 ENCOUNTER — Ambulatory Visit (INDEPENDENT_AMBULATORY_CARE_PROVIDER_SITE_OTHER): Payer: Medicare Other | Admitting: Cardiovascular Disease

## 2014-03-29 ENCOUNTER — Other Ambulatory Visit: Payer: Self-pay | Admitting: Cardiovascular Disease

## 2014-03-29 VITALS — BP 93/66 | HR 100 | Ht 70.0 in | Wt 191.8 lb

## 2014-03-29 DIAGNOSIS — R000 Tachycardia, unspecified: Secondary | ICD-10-CM

## 2014-03-29 DIAGNOSIS — I498 Other specified cardiac arrhythmias: Secondary | ICD-10-CM

## 2014-03-29 DIAGNOSIS — I5022 Chronic systolic (congestive) heart failure: Secondary | ICD-10-CM

## 2014-03-29 DIAGNOSIS — I447 Left bundle-branch block, unspecified: Secondary | ICD-10-CM

## 2014-03-29 LAB — BASIC METABOLIC PANEL
Anion Gap: 3 — ABNORMAL LOW (ref 7–16)
BUN: 16 mg/dL (ref 7–18)
CHLORIDE: 105 mmol/L (ref 98–107)
CO2: 31 mmol/L (ref 21–32)
Calcium, Total: 9.2 mg/dL (ref 8.5–10.1)
Creatinine: 0.89 mg/dL (ref 0.60–1.30)
Glucose: 85 mg/dL (ref 65–99)
OSMOLALITY: 278 (ref 275–301)
Potassium: 4.4 mmol/L (ref 3.5–5.1)
Sodium: 139 mmol/L (ref 136–145)

## 2014-03-29 LAB — DIGOXIN LEVEL: Digoxin: 1.15 ng/mL

## 2014-03-29 NOTE — Assessment & Plan Note (Signed)
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 Filling pressures were only mildly elevated. Thus, no need for a loop diuretic at this time especially with his baseline hypotension. I am not able to up titrate his medications due to hypotension. Check basic metabolic profile given the recent addition of spironolactone. I will also check digoxin level and consider decreasing the dose.

## 2014-03-29 NOTE — Assessment & Plan Note (Signed)
I suspect that he will most likely require an ICD CRT placement. Until then, I recommend an external defibrillator given his degree of LV systolic dysfunction, intermittent sinus tachycardia, hypertension and left bundle branch block. I believe he is at high risk for ventricular arrhythmia.

## 2014-03-29 NOTE — Patient Instructions (Signed)
Your physician recommends that you return for lab work in:  Please take lab order to [facility] for labs today   Your physician recommends that you schedule a follow-up appointment in:  1 month with [name]  We will call you this week with instructions for your life vest.

## 2014-03-29 NOTE — Progress Notes (Signed)
Primary care physician Dr. Ulanda Edison:   HPI  This is a pleasant 73 year old male who is here today for a followup visit regarding chronic systolic heart failure with severely reduced LV systolic function and an underlying left bundle branch block.  He used to see Dr. Gwen Pounds and has seen him for many years initially for sinus tachycardia with moderate LV systolic dysfunction. He had cardiac catheterization done in March of 2012 which showed normal coronary arteries with ejection fraction of 35% and mild aortic stenosis. He had no significant symptoms of heart failure at that time.  He has known history of COPD and previous tobacco use. Other medical problems include hypertension, hyperlipidemia and  left bundle branch block. He underwent an echocardiogram in March of this year which showed worsening of LV systolic function with an ejection fraction of 15%. Shortly after that, the patient started experiencing significant shortness of breath and cough. He had 2 recent hospitalization at [facility] for fatigue, dyspnea and cough. He was initially treated for COPD exacerbation. I proceeded with a right left cardiac catheterization last week which showed only mildly elevated filling pressures with normal cardiac output and no significant pulmonary hypertension. Coronary angiography showed no obstructive coronary artery disease. Ejection fraction was 10-15%. He was started on small dose carvedilol and spironolactone. He is overall feeling better.   Allergies  Allergen Reactions  . Lisinopril Hives  . Metoprolol Nausea And Vomiting  . Norvasc [Amlodipine Besylate]     unknown     Current Outpatient Prescriptions on File Prior to Visit  Medication Sig Dispense Refill  . albuterol (PROAIR HFA) 108 (90 BASE) MCG/ACT inhaler Inhale 2 puffs into the lungs every 6 (six) hours as needed for wheezing or shortness of breath.      Marland Kitchen aspirin 81 MG tablet Take 81 mg by mouth daily.      Marland Kitchen atorvastatin (LIPITOR) 40  MG tablet Take 40 mg by mouth daily.      . carvedilol (COREG) 3.125 MG tablet Take 1 tablet (3.125 mg total) by mouth 2 (two) times daily.  60 tablet  6  . digoxin (LANOXIN) 0.25 MG tablet Take 0.25 mg by mouth daily.      . Fluticasone-Salmeterol (ADVAIR) 250-50 MCG/DOSE AEPB Inhale 1 puff into the lungs 2 (two) times daily.      Marland Kitchen losartan (COZAAR) 25 MG tablet Takes 1/4 of pill daily.      Marland Kitchen omeprazole (PRILOSEC) 20 MG capsule Take 20 mg by mouth daily.      Marland Kitchen spironolactone (ALDACTONE) 25 MG tablet Take 1 tablet (25 mg total) by mouth daily.  30 tablet  6  . tamsulosin (FLOMAX) 0.4 MG CAPS capsule Take 0.4 mg by mouth daily.      Marland Kitchen tiotropium (SPIRIVA) 18 MCG inhalation capsule Place 18 mcg into inhaler and inhale daily.      . vitamin C (ASCORBIC ACID) 500 MG tablet Take 500 mg by mouth daily.       No current facility-administered medications on file prior to visit.     Past Medical History  Diagnosis Date  . History of BPH   . COPD (chronic obstructive pulmonary disease)   . Hypertension   . Hyperlipidemia   . Chronic back pain   . Pneumonia   . Heart murmur   . Chronic systolic heart failure     Ejection fraction of 15%  . Sinus tachycardia   . Left bundle branch block      Past Surgical  History  Procedure Laterality Date  . Cardiac [facility]  2012    [facility]  [facility]     Family History  Problem Relation Age of Onset  . Heart disease Father   . Heart failure Father   . Hypertension Mother   . Hyperlipidemia Mother      History   Social History  . Marital Status: Married    Spouse Name: N/A    Number of Children: N/A  . Years of Education: N/A   Occupational History  . Not on file.   Social History Main Topics  . Smoking status: Former Smoker -- 0 years    Types: Cigarettes  . Smokeless tobacco: Former NeurosurgeonUser    Types: Snuff  . Alcohol Use: Yes     Comment: occasional  . Drug Use: No  . Sexual Activity: Not on file    Other Topics Concern  . Not on file   Social History Narrative  . No narrative on file     ROS A 10 point review of system was performed. It is negative other than that mentioned in the history of present illness.   PHYSICAL EXAM   BP 93/66  Pulse 100  Ht 5\' 10"  (1.778 m)  Wt 191 lb 12 oz (86.977 kg)  BMI 27.51 kg/m2 Constitutional: He is oriented to person, place, and time. He appears well-developed and well-nourished. No distress.  HENT: No nasal discharge.  Head: Normocephalic and atraumatic.  Eyes: Pupils are equal and round.  No discharge. Neck: Normal range of motion. Neck supple. No JVD present. No thyromegaly present.  Cardiovascular: Normal rate, regular rhythm, normal heart sounds. Exam reveals no gallop and no friction rub. No murmur heard.  Pulmonary/Chest: Effort normal and breath sounds normal. No stridor. No respiratory distress. He has no wheezes. He has no rales. He exhibits no tenderness.  Abdominal: Soft. Bowel sounds are normal. He exhibits no distension. There is no tenderness. There is no rebound and no guarding.  Musculoskeletal: Normal range of motion. He exhibits no edema and no tenderness.  Neurological: He is alert and oriented to person, place, and time. Coordination normal.  Skin: Skin is warm and dry. No rash noted. He is not diaphoretic. No erythema. No pallor.  Psychiatric: He has a normal mood and affect. His behavior is normal. Judgment and thought content normal.       ZOX:[id number]:Sinus  Rhythm  -Left bundle branch block.   ABNORMAL    ASSESSMENT AND PLAN

## 2014-03-29 NOTE — Assessment & Plan Note (Signed)
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 This improved with small dose carvedilol. The dose cannot be increased due to low blood pressure. The other option would be Ivabradine which I will consider.

## 2014-03-31 ENCOUNTER — Telehealth: Payer: Self-pay | Admitting: *Deleted

## 2014-03-31 MED ORDER — DIGOXIN 125 MCG PO TABS: 0.1250 mg | ORAL_TABLET | Freq: Every day | ORAL | Status: DC

## 2014-03-31 NOTE — Telephone Encounter (Signed)
Informed patient that per [name] his digoxin level was elevated  Decrease digoxin to 0.125 mg once daily  Patient verbalized understanding   Faxed life vest order to [facility]  Patient instructed to call if he does not hear from [facility] within 48 hrs

## 2014-04-04 ENCOUNTER — Encounter: Payer: Self-pay | Admitting: Cardiovascular Disease

## 2014-04-07 ENCOUNTER — Telehealth: Payer: Self-pay | Admitting: *Deleted

## 2014-04-07 ENCOUNTER — Emergency Department: Payer: Self-pay | Admitting: Emergency Medicine

## 2014-04-07 LAB — URINALYSIS, COMPLETE
BILIRUBIN, UR: NEGATIVE
BLOOD: NEGATIVE
Glucose,UR: NEGATIVE mg/dL (ref 0–75)
Leukocyte Esterase: NEGATIVE
NITRITE: NEGATIVE
PH: 5 (ref 4.5–8.0)
Protein: NEGATIVE
RBC,UR: 4 /HPF (ref 0–5)
SPECIFIC GRAVITY: 1.018 (ref 1.003–1.030)
WBC UR: 3 /HPF (ref 0–5)

## 2014-04-07 LAB — COMPREHENSIVE METABOLIC PANEL
ALBUMIN: 3.3 g/dL — AB (ref 3.4–5.0)
ALK PHOS: 67 U/L
ANION GAP: 6 — AB (ref 7–16)
BILIRUBIN TOTAL: 0.8 mg/dL (ref 0.2–1.0)
BUN: 9 mg/dL (ref 7–18)
CO2: 28 mmol/L (ref 21–32)
CREATININE: 1.17 mg/dL (ref 0.60–1.30)
Calcium, Total: 8.9 mg/dL (ref 8.5–10.1)
Chloride: 101 mmol/L (ref 98–107)
EGFR (African American): >60
EGFR (Non-African Amer.): >60
Glucose: 130 mg/dL — ABNORMAL HIGH (ref 65–99)
OSMOLALITY: 271 (ref 275–301)
Potassium: 4.6 mmol/L (ref 3.5–5.1)
SGOT(AST): 35 U/L (ref 15–37)
SGPT (ALT): 36 U/L (ref 12–78)
Sodium: 135 mmol/L — ABNORMAL LOW (ref 136–145)
Total Protein: 7 g/dL (ref 6.4–8.2)

## 2014-04-07 LAB — CBC WITH DIFFERENTIAL/PLATELET
BASOS PCT: 0.5 %
Basophil #: 0 10*3/uL (ref 0.0–0.1)
Eosinophil #: 0 10*3/uL (ref 0.0–0.7)
Eosinophil %: 0.5 %
HCT: 42 % (ref 40.0–52.0)
HGB: 13.8 g/dL (ref 13.0–18.0)
LYMPHS ABS: 0.8 10*3/uL — AB (ref 1.0–3.6)
LYMPHS PCT: 10.9 %
MCH: 30 pg (ref 26.0–34.0)
MCHC: 32.9 g/dL (ref 32.0–36.0)
MCV: 91 fL (ref 80–100)
Monocyte #: 0.4 x10 3/mm (ref 0.2–1.0)
Monocyte %: 6.1 %
NEUTROS PCT: 82 %
Neutrophil #: 5.9 10*3/uL (ref 1.4–6.5)
PLATELETS: 165 10*3/uL (ref 150–440)
RBC: 4.61 10*6/uL (ref 4.40–5.90)
RDW: 15.1 % — ABNORMAL HIGH (ref 11.5–14.5)
WBC: 7.2 10*3/uL (ref 3.8–10.6)

## 2014-04-07 NOTE — Telephone Encounter (Signed)
FYI

## 2014-04-07 NOTE — Telephone Encounter (Signed)
Patient's wife called and he is at [facility] ED with Kidney stones in severe pain. She just wanted [place] to know.

## 2014-05-02 ENCOUNTER — Ambulatory Visit (INDEPENDENT_AMBULATORY_CARE_PROVIDER_SITE_OTHER): Payer: Medicare Other | Admitting: Cardiovascular Disease

## 2014-05-02 ENCOUNTER — Encounter: Payer: Self-pay | Admitting: Cardiovascular Disease

## 2014-05-02 VITALS — BP 100/82 | HR 107 | Ht 70.0 in | Wt 196.2 lb

## 2014-05-02 DIAGNOSIS — I498 Other specified cardiac arrhythmias: Secondary | ICD-10-CM

## 2014-05-02 DIAGNOSIS — I447 Left bundle-branch block, unspecified: Secondary | ICD-10-CM

## 2014-05-02 DIAGNOSIS — I5022 Chronic systolic (congestive) heart failure: Secondary | ICD-10-CM

## 2014-05-02 DIAGNOSIS — R000 Tachycardia, unspecified: Secondary | ICD-10-CM

## 2014-05-02 MED ORDER — IVABRADINE HCL 5 MG PO TABS: 5.0000 mg | ORAL_TABLET | Freq: Two times a day (BID) | ORAL | Status: DC

## 2014-05-02 NOTE — Assessment & Plan Note (Addendum)
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 Filling pressures were only mildly elevated. I am not able to up titrate his medications due to hypotension. The dose of digoxin was decreased given that the level was above 1. He continues to be in [place] Heart Association class III. He is having significant weight loss which I suspect is likely due to his cardiac status. I will consider resuming small dose losartan in the near future.

## 2014-05-02 NOTE — Progress Notes (Signed)
Primary care physician Dr. Ulanda Edison:   HPI  This is a pleasant 73 year old male who is here today for a followup visit regarding chronic systolic heart failure with severely reduced LV systolic function and an underlying left bundle branch block.  He used to see Dr. Gwen Pounds and has seen him for many years initially for sinus tachycardia with moderate LV systolic dysfunction. He had cardiac catheterization done in March of 2012 which showed normal coronary arteries with ejection fraction of 35% and mild aortic stenosis. He had no significant symptoms of heart failure at that time.  He has known history of COPD and previous tobacco use. Other medical problems include hypertension, hyperlipidemia and  left bundle branch block. He underwent an echocardiogram in March of this year which showed worsening of LV systolic function with an ejection fraction of 15%. Shortly after that, the patient started experiencing significant shortness of breath and cough. He had 2 recent hospitalization at [facility] for fatigue, dyspnea and cough. He was initially treated for COPD exacerbation. I proceeded with a right left cardiac catheterization  which showed only mildly elevated filling pressures with normal cardiac output and no significant pulmonary hypertension. Coronary angiography showed no obstructive coronary artery disease. Ejection fraction was 10-15%. He was started on small dose carvedilol and spironolactone. I lifevest was placed. He continues to have dyspnea with minimal activities. No orthopnea or PND. He continues to be tachycardic.  Allergies  Allergen Reactions  . Lisinopril Hives  . Metoprolol Nausea And Vomiting  . Norvasc [Amlodipine Besylate]     unknown     Current Outpatient Prescriptions on File Prior to Visit  Medication Sig Dispense Refill  . albuterol (PROAIR HFA) 108 (90 BASE) MCG/ACT inhaler Inhale 2 puffs into the lungs every 6 (six) hours as needed for wheezing or shortness of breath.       Marland Kitchen aspirin 81 MG tablet Take 81 mg by mouth daily.      Marland Kitchen atorvastatin (LIPITOR) 40 MG tablet Take 40 mg by mouth daily.      . carvedilol (COREG) 3.125 MG tablet Take 1 tablet (3.125 mg total) by mouth 2 (two) times daily.  60 tablet  6  . digoxin (LANOXIN) 0.125 MG tablet Take 1 tablet (0.125 mg total) by mouth daily.  90 tablet  3  . Fluticasone-Salmeterol (ADVAIR) 250-50 MCG/DOSE AEPB Inhale 1 puff into the lungs 2 (two) times daily.      Marland Kitchen losartan (COZAAR) 25 MG tablet Takes 1/4 of pill daily.      Marland Kitchen omeprazole (PRILOSEC) 20 MG capsule Take 20 mg by mouth daily.      Marland Kitchen spironolactone (ALDACTONE) 25 MG tablet Take 1 tablet (25 mg total) by mouth daily.  30 tablet  6  . tamsulosin (FLOMAX) 0.4 MG CAPS capsule Take 0.4 mg by mouth daily.      Marland Kitchen tiotropium (SPIRIVA) 18 MCG inhalation capsule Place 18 mcg into inhaler and inhale daily.      . vitamin C (ASCORBIC ACID) 500 MG tablet Take 500 mg by mouth daily.       No current facility-administered medications on file prior to visit.     Past Medical History  Diagnosis Date  . History of BPH   . COPD (chronic obstructive pulmonary disease)   . Hypertension   . Hyperlipidemia   . Chronic back pain   . Pneumonia   . Heart murmur   . Chronic systolic heart failure     Ejection fraction of 15%  .  Sinus tachycardia   . Left bundle branch block   . Kidney stone      Past Surgical History  Procedure Laterality Date  . Cardiac catheterization  2012    [facility]  . Cardiac catheter[facility]      [facility]     Family History  Problem Relation Age of Onset  . Heart disease Father   . Heart failure Father   . Hypertension Mother   . Hyperlipidemia Mother      History   Social History  . Marital Status: Married    Spouse Name: N/A    Number of Children: N/A  . Years of Education: N/A   Occupational History  . Not on file.   Social History Main Topics  . Smoking status: Former Smoker -- 0 years    Types: Cigarettes  .  Smokeless tobacco: Former NeurosurgeonUser    Types: Snuff  . Alcohol Use: Yes     Comment: occasional  . Drug Use: No  . Sexual Activity: Not on file   Other Topics Concern  . Not on file   Social History Narrative  . No narrative on file     ROS A 10 point review of system was performed. It is negative other than that mentioned in the history of present illness.   PHYSICAL EXAM   BP 100/82  Pulse 107  Ht 5\' 10"  (1.778 m)  Wt 196 lb 4 oz (89.018 kg)  BMI 28.16 kg/m2 Constitutional: He is oriented to person, place, and time. He appears well-developed and well-nourished. No distress.  HENT: No nasal discharge.  Head: Normocephalic and atraumatic.  Eyes: Pupils are equal and round.  No discharge. Neck: Normal range of motion. Neck supple. No JVD present. No thyromegaly present.  Cardiovascular: Tachycardic, regular rhythm, normal heart sounds. Exam reveals no gallop and no friction rub. No murmur heard.  Pulmonary/Chest: Effort normal and breath sounds normal. No stridor. No respiratory distress. He has no wheezes. He has no rales. He exhibits no tenderness.  Abdominal: Soft. Bowel sounds are normal. He exhibits no distension. There is no tenderness. There is no rebound and no guarding.  Musculoskeletal: Normal range of motion. He exhibits no edema and no tenderness.  Neurological: He is alert and oriented to person, place, and time. Coordination normal.  Skin: Skin is warm and dry. No rash noted. He is not diaphoretic. No erythema. No pallor.  Psychiatric: He has a normal mood and affect. His behavior is normal. Judgment and thought content normal.       WUJ:[id number]:Sinus  Tachycardia  - occasional ectopic ventricular beat    -Left bundle branch block.   ABNORMAL     ASSESSMENT AND PLAN

## 2014-05-02 NOTE — Patient Instructions (Signed)
Your physician has recommended you make the following change in your medication:  Losartan has been stopped  Start Corlanor 5 mg twice daily   Your physician recommends that you schedule a follow-up appointment in:  2 weeks

## 2014-05-02 NOTE — Assessment & Plan Note (Signed)
Continued sinus tachycardia is concerning as the etiology of his cardiomyopathy might be tachycardia induced. I started treatment today with  Ivabradine 5 mg twice daily. He is to followup in 2 weeks for up titration.

## 2014-05-02 NOTE — Assessment & Plan Note (Signed)
The patient would benefit from ICD/CRT and will be referred to Dr. Graciela Husbands in the near future.

## 2014-05-20 ENCOUNTER — Encounter: Payer: Self-pay | Admitting: Cardiovascular Disease

## 2014-05-20 ENCOUNTER — Ambulatory Visit (INDEPENDENT_AMBULATORY_CARE_PROVIDER_SITE_OTHER): Payer: Medicare Other | Admitting: Cardiovascular Disease

## 2014-05-20 VITALS — BP 100/78 | HR 86 | Ht 70.0 in | Wt 193.5 lb

## 2014-05-20 DIAGNOSIS — R000 Tachycardia, unspecified: Secondary | ICD-10-CM

## 2014-05-20 DIAGNOSIS — I447 Left bundle-branch block, unspecified: Secondary | ICD-10-CM

## 2014-05-20 DIAGNOSIS — R0602 Shortness of breath: Secondary | ICD-10-CM

## 2014-05-20 DIAGNOSIS — I498 Other specified cardiac arrhythmias: Secondary | ICD-10-CM

## 2014-05-20 DIAGNOSIS — I5022 Chronic systolic (congestive) heart failure: Secondary | ICD-10-CM

## 2014-05-20 NOTE — Assessment & Plan Note (Signed)
If an ICD is needed, he will require CRT as well as he is in [place] Heart Association class III.

## 2014-05-20 NOTE — Progress Notes (Signed)
Primary care physician Dr. Ulanda Edison:   HPI  This is a pleasant 73 year old male who is here today for a followup visit regarding chronic systolic heart failure with severely reduced LV systolic function and an underlying left bundle branch block.  He used to see Dr. Gwen Pounds and has seen him for many years initially for sinus tachycardia with moderate LV systolic dysfunction. He had cardiac catheterization done in March of 2012 which showed normal coronary arteries with ejection fraction of 35% and mild aortic stenosis. He had no significant symptoms of heart failure at that time.  He has known history of COPD and previous tobacco use. Other medical problems include hypertension, hyperlipidemia and  left bundle branch block. He underwent an echocardiogram in March of this year which showed worsening of LV systolic function with an ejection fraction of 15%. Shortly after that, the patient started experiencing significant shortness of breath and cough. He had 2 recent hospitalization at [facility] for fatigue, dyspnea and cough. He was initially treated for COPD exacerbation. I proceeded with a right left cardiac catheterization in July which showed only mildly elevated filling pressures with normal cardiac output and no significant pulmonary hypertension. Coronary angiography showed no obstructive coronary artery disease. Ejection fraction was 10-15%. He was started on small dose carvedilol and spironolactone. I lifevest was placed.   he continued to have sinus tachycardia and could not advance the dose of carvedilol due to hypotension. I added Ivabradine 5 mg twice daily the last visit. He reports significant improvement in tachycardia and symptoms.   Allergies  Allergen Reactions  . Lisinopril Hives  . Metoprolol Nausea And Vomiting  . Norvasc [Amlodipine Besylate]     unknown     Current Outpatient Prescriptions on File Prior to Visit  Medication Sig Dispense Refill  . albuterol (PROAIR HFA)  108 (90 BASE) MCG/ACT inhaler Inhale 2 puffs into the lungs every 6 (six) hours as needed for wheezing or shortness of breath.      Marland Kitchen aspirin 81 MG tablet Take 81 mg by mouth daily.      Marland Kitchen atorvastatin (LIPITOR) 40 MG tablet Take 40 mg by mouth daily.      . carvedilol (COREG) 3.125 MG tablet Take 1 tablet (3.125 mg total) by mouth 2 (two) times daily.  60 tablet  6  . digoxin (LANOXIN) 0.125 MG tablet Take 1 tablet (0.125 mg total) by mouth daily.  90 tablet  3  . Fluticasone-Salmeterol (ADVAIR) 250-50 MCG/DOSE AEPB Inhale 1 puff into the lungs 2 (two) times daily.      . ivabradine HCl (CORLANOR) 5 MG TABS tablet Take 1 tablet (5 mg total) by mouth 2 (two) times daily with a meal.  60 tablet  6  . omeprazole (PRILOSEC) 20 MG capsule Take 20 mg by mouth daily.      Marland Kitchen spironolactone (ALDACTONE) 25 MG tablet Take 1 tablet (25 mg total) by mouth daily.  30 tablet  6  . tamsulosin (FLOMAX) 0.4 MG CAPS capsule Take 0.4 mg by mouth daily.      Marland Kitchen tiotropium (SPIRIVA) 18 MCG inhalation capsule Place 18 mcg into inhaler and inhale daily.      . vitamin C (ASCORBIC ACID) 500 MG tablet Take 500 mg by mouth daily.       No current facility-administered medications on file prior to visit.     Past Medical History  Diagnosis Date  . History of BPH   . COPD (chronic obstructive pulmonary disease)   .  Hypertension   . Hyperlipidemia   . Chronic back pain   . Pneumonia   . Heart murmur   . Chronic systolic heart failure     Ejection fraction of 15%  . Sinus tachycardia   . Left bundle branch block   . Kidney stone      Past Surgical History  Procedure Laterality Date  . Cardiac catheterization  2012    [facility]  . Cardiac catheterization      [facility]     Family History  Problem Relation Age of Onset  . Heart disease Father   . Heart failure Father   . Hypertension Mother   . Hyperlipidemia Mother      History   Social History  . Marital Status: Married    Spouse Name: N/A    Number of  Children: N/A  . Years of Education: N/A   Occupational History  . Not on file.   Social History Main Topics  . Smoking status: Former Smoker -- 0 years    Types: Cigarettes  . Smokeless tobacco: Former Neurosurgeon    Types: Snuff  . Alcohol Use: Yes     Comment: occasional  . Drug Use: No  . Sexual Activity: Not on file   Other Topics Concern  . Not on file   Social History Narrative  . No narrative on file     ROS A 10 point review of system was performed. It is negative other than that mentioned in the history of present illness.   PHYSICAL EXAM   BP 100/78  Ht  (1.778 m)  Wt 193 lb 8 oz (87.771 kg)  BMI 27.76 kg/m2 Constitutional: He is oriented to person, place, and time. He appears well-developed and well-nourished. No distress.  HENT: No nasal discharge.  Head: Normocephalic and atraumatic.  Eyes: Pupils are equal and round.  No discharge. Neck: Normal range of motion. Neck supple. No JVD present. No thyromegaly present.  Cardiovascular: Tachycardic, regular rhythm, normal heart sounds. Exam reveals no gallop and no friction rub. No murmur heard.  Pulmonary/Chest: Effort normal and breath sounds normal. No stridor. No respiratory distress. He has no wheezes. He has no rales. He exhibits no tenderness.  Abdominal: Soft. Bowel sounds are normal. He exhibits no distension. There is no tenderness. There is no rebound and no guarding.  Musculoskeletal: Normal range of motion. He exhibits no edema and no tenderness.  Neurological: He is alert and oriented to person, place, and time. Coordination normal.  Skin: Skin is warm and dry. No rash noted. He is not diaphoretic. No erythema. No pallor.  Psychiatric: He has a normal mood and affect. His behavior is normal. Judgment and thought content normal.       GNF:AOZHY  Rhythm  -Left bundle branch block.   ABNORMAL      ASSESSMENT AND PLAN

## 2014-05-20 NOTE — Patient Instructions (Signed)
Your physician recommends that you have labs today: BMP Dig level    Continue your Corlanor   Your physician has requested that you have an echocardiogram the first week of October. Echocardiography is a painless test that uses sound waves to create images of your heart. It provides your doctor with information about the size and shape of your heart and how well your heart's chambers and valves are working. This procedure takes approximately one hour. There are no restrictions for this procedure.   Your physician recommends that you schedule a follow-up appointment in:  The second week of October with [name]

## 2014-05-20 NOTE — Assessment & Plan Note (Signed)
Sinus tachycardia improved significantly after the addition of Ivabradine. I am not planning to increase the dose further due to the presence of left bundle branch block. Otherwise, he is on optimal medical therapy and appears to be euvolemic. Not able to increase  medications further due to hypotension. I will repeat echocardiogram in October to determine the need for ICD.

## 2014-05-21 LAB — BASIC METABOLIC PANEL
BUN / CREAT RATIO: 15 (ref 10–22)
BUN: 12 mg/dL (ref 8–27)
CO2: 22 mmol/L (ref 18–29)
CREATININE: 0.8 mg/dL (ref 0.76–1.27)
Calcium: 9.3 mg/dL (ref 8.6–10.2)
Chloride: 101 mmol/L (ref 97–108)
GFR calc non Af Amer: 87 mL/min/{1.73_m2} (ref >59)
GFR, EST AFRICAN AMERICAN: 101 mL/min/{1.73_m2} (ref >59)
Glucose: 99 mg/dL (ref 65–99)
POTASSIUM: 4 mmol/L (ref 3.5–5.2)
SODIUM: 140 mmol/L (ref 134–144)

## 2014-05-21 LAB — DIGOXIN LEVEL: Digoxin Level: 0.6 ng/mL — ABNORMAL LOW (ref 0.9–2.0)

## 2014-05-25 ENCOUNTER — Telehealth: Payer: Self-pay | Admitting: *Deleted

## 2014-05-25 NOTE — Telephone Encounter (Signed)
Corlanor insurance verification formed faxed

## 2014-05-27 ENCOUNTER — Telehealth: Payer: Self-pay

## 2014-05-27 ENCOUNTER — Telehealth: Payer: Self-pay | Admitting: *Deleted

## 2014-05-27 NOTE — Telephone Encounter (Signed)
Spoke with patient and medication rep Med rep is going to contact the pharmacy and call me Tuesday  Patient aware

## 2014-05-27 NOTE — Telephone Encounter (Signed)
Pt has questions regarding a medication card he picked up today.

## 2014-05-27 NOTE — Telephone Encounter (Signed)
Informed patient that Corlanor has been approved and his co pay card is at the front desk for pick up

## 2014-05-27 NOTE — Telephone Encounter (Signed)
Called regarding new medication [name] was putting him on. Has question.

## 2014-05-31 NOTE — Telephone Encounter (Signed)
Rep to bring samples 9/8

## 2014-05-31 NOTE — Telephone Encounter (Signed)
See other phone note from the same day

## 2014-06-01 ENCOUNTER — Telehealth: Payer: Self-pay | Admitting: *Deleted

## 2014-06-01 NOTE — Telephone Encounter (Signed)
LVM to let patient know rep is bringing samples 06/02/14

## 2014-06-01 NOTE — Telephone Encounter (Signed)
Please call patient regarding Corlanor samples.

## 2014-06-02 NOTE — Telephone Encounter (Signed)
Corlaner amples left at front desk for patient  Patient is aware

## 2014-06-28 ENCOUNTER — Other Ambulatory Visit: Payer: Self-pay

## 2014-06-28 ENCOUNTER — Other Ambulatory Visit (INDEPENDENT_AMBULATORY_CARE_PROVIDER_SITE_OTHER): Payer: Medicare Other

## 2014-06-28 DIAGNOSIS — I5022 Chronic systolic (congestive) heart failure: Secondary | ICD-10-CM

## 2014-06-28 DIAGNOSIS — R0602 Shortness of breath: Secondary | ICD-10-CM

## 2014-07-05 ENCOUNTER — Ambulatory Visit (INDEPENDENT_AMBULATORY_CARE_PROVIDER_SITE_OTHER): Payer: Medicare Other | Admitting: Cardiovascular Disease

## 2014-07-05 ENCOUNTER — Encounter: Payer: Self-pay | Admitting: Cardiovascular Disease

## 2014-07-05 VITALS — BP 100/68 | HR 61 | Ht 70.0 in | Wt 193.5 lb

## 2014-07-05 DIAGNOSIS — I5022 Chronic systolic (congestive) heart failure: Secondary | ICD-10-CM

## 2014-07-05 DIAGNOSIS — I447 Left bundle-branch block, unspecified: Secondary | ICD-10-CM

## 2014-07-05 DIAGNOSIS — R000 Tachycardia, unspecified: Secondary | ICD-10-CM

## 2014-07-05 DIAGNOSIS — I471 Supraventricular tachycardia: Secondary | ICD-10-CM

## 2014-07-05 NOTE — Assessment & Plan Note (Addendum)
Sinus tachycardia improved significantly after the addition of Ivabradine.  He is not on ACE inhibitors/ARB due to low blood pressure.  Repeat echocardiogram this month showed an ejection fraction of 15-20% with moderate aortic stenosis.  I am referring him to EP for an ICD placement. He continues to wear a lifevest.

## 2014-07-05 NOTE — Patient Instructions (Addendum)
You have been referred to one of our Electrophysiology Doctors. Please see Dr. Graciela Husbands here or any of our EP doctors in [place] as soon as possible.    Your physician recommends that you continue on your current medications as directed. Please refer to the Current Medication list given to you today.  Your physician recommends that you schedule a follow-up appointment in:  3 months with [name]   Your next appointment will be scheduled in our new office located at :  [facility]  [address]  [place], [place] [address]

## 2014-07-05 NOTE — Assessment & Plan Note (Signed)
Surprisingly, QRS has narrowed after improving his tachycardia. He might still require CRT.

## 2014-07-05 NOTE — Assessment & Plan Note (Signed)
This has resolved with digoxin and Corlanor.

## 2014-07-05 NOTE — Progress Notes (Signed)
Primary care physician Dr. Ulanda EdisonEmma Williams:   HPI  This is a pleasant 73 year old male who is here today for a followup visit regarding chronic systolic heart failure with severely reduced LV systolic function and an underlying left bundle branch block.  He used to see Dr. Gwen PoundsKowalski and has seen him for many years initially for sinus tachycardia with moderate LV systolic dysfunction. He had cardiac catheterization done in March of 2012 which showed normal coronary arteries with ejection fraction of 35% and mild aortic stenosis. He had no significant symptoms of heart failure at that time.  He has known history of COPD and previous tobacco use. Other medical problems include hypertension, hyperlipidemia and  left bundle branch block. He underwent an echocardiogram in March of this year which showed worsening of LV systolic function with an ejection fraction of 15%. Shortly after that, the patient started experiencing significant shortness of breath and cough with required 2 hospitalization [facility]. I proceeded with a right left cardiac catheterization in July which showed only mildly elevated filling pressures with normal cardiac output and no significant pulmonary hypertension. Coronary angiography showed no obstructive coronary artery disease. Ejection fraction was 10-15%. He was started on small dose carvedilol and spironolactone. A lifevest was placed.  He continued to have sinus tachycardia and could not advance the dose of carvedilol due to hypotension. I added Ivabradine 5 mg twice daily the last visit. He had significant and gradual improvement since then. We have not been able to use an ACE inhibitor due to low blood pressure.   Allergies  Allergen Reactions  . Lisinopril Hives  . Metoprolol Nausea And Vomiting  . Norvasc [Amlodipine Besylate]     unknown     Current Outpatient Prescriptions on File Prior to Visit  Medication Sig Dispense Refill  . albuterol (PROAIR HFA) 108 (90 BASE)  MCG/ACT inhaler Inhale 2 puffs into the lungs every 6 (six) hours as needed for wheezing or shortness of breath.      Marland Kitchen. aspirin 81 MG tablet Take 81 mg by mouth daily.      Marland Kitchen. atorvastatin (LIPITOR) 40 MG tablet Take 40 mg by mouth daily.      . carvedilol (COREG) 3.125 MG tablet Take 1 tablet (3.125 mg total) by mouth 2 (two) times daily.  60 tablet  6  . digoxin (LANOXIN) 0.125 MG tablet Take 1 tablet (0.125 mg total) by mouth daily.  90 tablet  3  . Fluticasone-Salmeterol (ADVAIR) 250-50 MCG/DOSE AEPB Inhale 1 puff into the lungs 2 (two) times daily.      . ivabradine HCl (CORLANOR) 5 MG TABS tablet Take 1 tablet (5 mg total) by mouth 2 (two) times daily with a meal.  60 tablet  6  . omeprazole (PRILOSEC) 20 MG capsule Take 20 mg by mouth daily.      Marland Kitchen. spironolactone (ALDACTONE) 25 MG tablet Take 1 tablet (25 mg total) by mouth daily.  30 tablet  6  . tamsulosin (FLOMAX) 0.4 MG CAPS capsule Take 0.4 mg by mouth daily.      Marland Kitchen. tiotropium (SPIRIVA) 18 MCG inhalation capsule Place 18 mcg into inhaler and inhale daily.      . vitamin C (ASCORBIC ACID) 500 MG tablet Take 500 mg by mouth daily.       No current facility-administered medications on file prior to visit.     Past Medical History  Diagnosis Date  . History of BPH   . COPD (chronic obstructive pulmonary disease)   .  Hypertension   . Hyperlipidemia   . Chronic back pain   . Pneumonia   . Heart murmur   . Chronic systolic heart failure     Ejection fraction of 15%  . Sinus tachycardia   . Left bundle branch block   . Kidney stone      Past Surgical History  Procedure Laterality Date  . Cardiac catheterization  2012    [facility]  . Cardiac catheterization      [facility]     Family History  Problem Relation Age of Onset  . Heart disease Father   . Heart failure Father   . Hypertension Mother   . Hyperlipidemia Mother      History   Social History  . Marital Status: Married    Spouse Name: N/A    Number of Children: N/A   . Years of Education: N/A   Occupational History  . Not on file.   Social History Main Topics  . Smoking status: Former Smoker -- 0 years    Types: Cigarettes  . Smokeless tobacco: Former Neurosurgeon    Types: Snuff  . Alcohol Use: Yes     Comment: occasional  . Drug Use: No  . Sexual Activity: Not on file   Other Topics Concern  . Not on file   Social History Narrative  . No narrative on file     ROS A 10 point review of system was performed. It is negative other than that mentioned in the history of present illness.   PHYSICAL EXAM   BP 100/68  Pulse 61  Ht 5\' 10"  (1.778 m)  Wt 193 lb 8 oz (87.771 kg)  BMI 27.76 kg/m2 Constitutional: He is oriented to person, place, and time. He appears well-developed and well-nourished. No distress.  HENT: No nasal discharge.  Head: Normocephalic and atraumatic.  Eyes: Pupils are equal and round.  No discharge. Neck: Normal range of motion. Neck supple. No JVD present. No thyromegaly present.  Cardiovascular: Normal rate, regular rhythm, normal heart sounds. Exam reveals no gallop and no friction rub. There is a 3/6 systolic ejection murmur in the aortic area Pulmonary/Chest: Effort normal and breath sounds normal. No stridor. No respiratory distress. He has no wheezes. He has no rales. He exhibits no tenderness.  Abdominal: Soft. Bowel sounds are normal. He exhibits no distension. There is no tenderness. There is no rebound and no guarding.  Musculoskeletal: Normal range of motion. He exhibits no edema and no tenderness.  Neurological: He is alert and oriented to person, place, and time. Coordination normal.  Skin: Skin is warm and dry. No rash noted. He is not diaphoretic. No erythema. No pallor.  Psychiatric: He has a normal mood and affect. His behavior is normal. Judgment and thought content normal.       JGG:EZMOQ  Rhythm  -Nonspecific ST depression   +   Diffuse nonspecific T-abnormality  -Nondiagnostic.  Incomplete left  bundle branch block  ABNORMAL      ASSESSMENT AND PLAN

## 2014-07-11 ENCOUNTER — Telehealth: Payer: Self-pay | Admitting: *Deleted

## 2014-07-21 ENCOUNTER — Ambulatory Visit (INDEPENDENT_AMBULATORY_CARE_PROVIDER_SITE_OTHER): Payer: Medicare Other | Admitting: Internal Medicine

## 2014-07-21 ENCOUNTER — Encounter: Payer: Self-pay | Admitting: Internal Medicine

## 2014-07-21 VITALS — BP 104/70 | HR 73 | Ht 69.0 in | Wt 192.1 lb

## 2014-07-21 DIAGNOSIS — I5022 Chronic systolic (congestive) heart failure: Secondary | ICD-10-CM

## 2014-07-21 DIAGNOSIS — I447 Left bundle-branch block, unspecified: Secondary | ICD-10-CM

## 2014-07-21 DIAGNOSIS — R000 Tachycardia, unspecified: Secondary | ICD-10-CM

## 2014-07-21 DIAGNOSIS — I471 Supraventricular tachycardia: Secondary | ICD-10-CM

## 2014-07-21 NOTE — Progress Notes (Addendum)
Primary care physician Dr. Ulanda Edison:  Primary Cardiologist: [name] Referring MD: [name]    73 year old male who is here today for EP consultation regarding risks of sudden death.   He has chronic systolic heart failure with severely reduced LV systolic function and an underlying left bundle branch block.. He had cardiac catheterization done in March of 2012 which showed normal coronary arteries with ejection fraction of 35% and mild aortic stenosis. He had no significant symptoms of heart failure at that time.  He has known history of COPD and previous tobacco use. Other medical problems include hypertension, hyperlipidemia and  left bundle branch block. He underwent an echocardiogram in March of this year which showed worsening of LV systolic function with an ejection fraction of 15%. Shortly after that, the patient started experiencing significant shortness of breath and cough with required 2 hospitalization at [facility].  He has chronic sinus tachycardia. He had a right left cardiac catheterization in July which showed only mildly elevated filling pressures with normal cardiac output and no significant pulmonary hypertension. Coronary angiography showed no obstructive coronary artery disease. Ejection fraction was 10-15%. He was started on small dose carvedilol and spironolactone. A lifevest was placed.  He was in acute decompensated CHF at the time. He continued to have sinus tachycardia and could not advance the dose of carvedilol due to hypotension.  He was evaluated by Dr Kirke Corin and initiated on Ivabradine 5 mg twice daily 8/15.  He had significant and gradual improvement since then.  ACE inhibitor unable to be used due to low blood pressure. He is feeling much better on above regimen of medications.   Presently, he is pleased with his health state.  He is much more active.  His concern today is with wearing the lifevest.  He finds that this restricts/ reduces his quality of life.      Allergies  Allergen Reactions  . Lisinopril Hives    Hives   . Metoprolol Nausea And Vomiting    Nausea and vomiting   . Norvasc [Amlodipine Besylate] Other (See Comments)    unknown     Current Outpatient Prescriptions on File Prior to Visit  Medication Sig Dispense Refill  . albuterol (PROAIR HFA) 108 (90 BASE) MCG/ACT inhaler Inhale 2 puffs into the lungs every 6 (six) hours as needed for wheezing or shortness of breath.      Marland Kitchen aspirin 81 MG tablet Take 81 mg by mouth daily.      Marland Kitchen atorvastatin (LIPITOR) 40 MG tablet Take 40 mg by mouth daily.      . carvedilol (COREG) 3.125 MG tablet Take 1 tablet (3.125 mg total) by mouth 2 (two) times daily.  60 tablet  6  . digoxin (LANOXIN) 0.125 MG tablet Take 1 tablet (0.125 mg total) by mouth daily.  90 tablet  3  . Fluticasone-Salmeterol (ADVAIR) 250-50 MCG/DOSE AEPB Inhale 1 puff into the lungs 2 (two) times daily.      . ivabradine HCl (CORLANOR) 5 MG TABS tablet Take 1 tablet (5 mg total) by mouth 2 (two) times daily with a meal.  60 tablet  6  . omeprazole (PRILOSEC) 20 MG capsule Take 20 mg by mouth daily.      Marland Kitchen spironolactone (ALDACTONE) 25 MG tablet Take 1 tablet (25 mg total) by mouth daily.  30 tablet  6  . tamsulosin (FLOMAX) 0.4 MG CAPS capsule Take 0.4 mg by mouth daily.      Marland Kitchen tiotropium (SPIRIVA) 18 MCG inhalation  capsule Place 18 mcg into inhaler and inhale daily.      . vitamin C (ASCORBIC ACID) 500 MG tablet Take 500 mg by mouth daily.       No current facility-administered medications on file prior to visit.     Past Medical History  Diagnosis Date  . History of BPH   . COPD (chronic obstructive pulmonary disease)   . Hypertension   . Hyperlipidemia   . Chronic back pain   . Pneumonia   . Heart murmur   . Chronic systolic heart failure     Ejection fraction of 15%  . Sinus tachycardia   . Left bundle branch block   . Kidney stone      Past Surgical History  Procedure Laterality Date  . Cardiac  catheterization  2012    [facility]  . [facility]      [facility]     Family History  Problem Relation Age of Onset  . Heart disease Father   . Heart failure Father   . Hypertension Mother   . Hyperlipidemia Mother      History   Social History  . Marital Status: Married    Spouse Name: N/A    Number of Children: N/A  . Years of Education: N/A   Occupational History  . Not on file.   Social History Main Topics  . Smoking status: Former Smoker -- 0 years    Types: Cigarettes  . Smokeless tobacco: Former NeurosurgeonUser    Types: Snuff  . Alcohol Use: Yes     Comment: occasional  . Drug Use: No  . Sexual Activity: Not on file   Other Topics Concern  . Not on file   Social History Narrative  . No narrative on file     ROS A 10 point review of system was performed. It is negative other than that mentioned in the history of present illness.   PHYSICAL EXAM   BP 104/70  Pulse 73  Ht 5\' 9"  (1.753 m)  Wt 192 lb 1.9 oz (87.145 kg)  BMI 28.36 kg/m2 Constitutional: He is oriented to person, place, and time. He appears well-developed and well-nourished. No distress.  HENT: No nasal discharge.  Head: Normocephalic and atraumatic.  Eyes: Pupils are equal and round.  No discharge. Neck: Normal range of motion. Neck supple. No JVD present. No thyromegaly present.  Cardiovascular: Normal rate, regular rhythm, normal heart sounds. Exam reveals no gallop and no friction rub. There is a 3/6 systolic ejection murmur in the aortic area Pulmonary/Chest: Effort normal and breath sounds normal. No stridor. No respiratory distress. He has no wheezes. He has no rales. He exhibits no tenderness.  Abdominal: Soft. Bowel sounds are normal. He exhibits no distension. There is no tenderness. There is no rebound and no guarding.  Musculoskeletal: Normal range of motion. He exhibits no edema and no tenderness.  Neurological: He is alert and oriented to person, place, and time. Coordination  normal.  Skin: Skin is warm and dry. No rash noted. He is not diaphoretic. No erythema. No pallor.  Psychiatric: He has a normal mood and affect. His behavior is normal. Judgment and thought content normal.   ZOX:WRUEAEKG:Sinus  Rhythm 61 bpm, PR 176, QRS 104 msec, nonspecific ST/T changes  Echo 06/28/14 :Left ventricle: The cavity size was severely dilated. Wall thickness was normal. Systolic function was severely reduced. The estimated ejection fraction was in the range of 15% to 20%. Diffuse hypokinesis. - Aortic valve: There was  moderate stenosis. There was mild regurgitation. Mean gradient (S): 19 mm Hg. Valve area (VTI): 0.75 cm^2. Although low gradient low output aortic stenosis can not be excluded, the degree of valve opening is consistent with moderate and not severe AS. - Mitral valve: There was mild to moderate regurgitation. - Left atrium: The atrium was mildly dilated.  Epic records were reviewed at length Over 50 pages of faxed medical records from [place] were reviewed today I also spoke with Dr Kirke Corin at length about the patients clinical condition.  ASSESSMENT AND PLAN  1. 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  He has a h/o longstanding sinus tachycardia and LBBB as well.  He has been recently placed on ivabridine by Dr Kirke Corin and has make remarkable improvement since that time.  His sinus tachycardia is resolved and his LBBB has resolved.  He has had remarkable improvement in CHF symptoms as well. I had a long discussion with the patient and his spouse today.  Given his profound clinical improvement.  I think that at this point it is advisable to continue medical optimization and repeat echo in 3 additional months.  If his EF remains < 35% then we should proceed with ICD implant.  I am hopeful however that his EF may recover. He is clear that he would like to return his LifeVest to the company at this time.  2. Sinus tachycardia Resolved with  ivabradine  Follow-up with Dr Kirke Corin in 3 months I will see again only should his repeat EF be <35% for ICD discussion.

## 2014-07-21 NOTE — Patient Instructions (Addendum)
Your physician has requested that you have an echocardiogram in 3 months ([place]). Echocardiography is a painless test that uses sound waves to create images of your heart. It provides your doctor with information about the size and shape of your heart and how well your heart's chambers and valves are working. This procedure takes approximately one hour. There are no restrictions for this procedure.  Your physician recommends that you schedule a follow-up appointment in: 3 months with [name] [facility]) Your next appointment will be scheduled in our new office located at : ______________ [facility]  [address]  [place], [place] [address]  You may take off your Life Vest now.

## 2014-09-01 ENCOUNTER — Encounter (HOSPITAL_COMMUNITY): Payer: Self-pay | Admitting: Cardiovascular Disease

## 2014-10-07 ENCOUNTER — Ambulatory Visit: Payer: Medicare Other | Admitting: Cardiovascular Disease

## 2014-10-12 ENCOUNTER — Telehealth: Payer: Self-pay | Admitting: Cardiovascular Disease

## 2014-10-12 NOTE — Telephone Encounter (Signed)
Placed samples of Corlanor 5 mg @ front desk for pick up.

## 2014-10-12 NOTE — Telephone Encounter (Signed)
Patients pharmacy is out of stock until tomorrow (weather permitting deliver).  Patient would like samples of Corlenor is available.  Please call patient.

## 2014-10-16 ENCOUNTER — Other Ambulatory Visit: Payer: Self-pay | Admitting: Cardiovascular Disease

## 2014-10-18 ENCOUNTER — Encounter: Payer: Self-pay | Admitting: Cardiovascular Disease

## 2014-10-18 ENCOUNTER — Other Ambulatory Visit (INDEPENDENT_AMBULATORY_CARE_PROVIDER_SITE_OTHER): Payer: Medicare Other

## 2014-10-18 ENCOUNTER — Ambulatory Visit (INDEPENDENT_AMBULATORY_CARE_PROVIDER_SITE_OTHER): Payer: Medicare Other | Admitting: Cardiovascular Disease

## 2014-10-18 ENCOUNTER — Other Ambulatory Visit: Payer: Self-pay

## 2014-10-18 VITALS — BP 122/80 | HR 73 | Ht 69.0 in | Wt 202.0 lb

## 2014-10-18 DIAGNOSIS — I5022 Chronic systolic (congestive) heart failure: Secondary | ICD-10-CM

## 2014-10-18 DIAGNOSIS — I350 Nonrheumatic aortic (valve) stenosis: Secondary | ICD-10-CM

## 2014-10-18 MED ORDER — LOSARTAN POTASSIUM 25 MG PO TABS: 25.0000 mg | ORAL_TABLET | Freq: Every day | ORAL | Status: DC

## 2014-10-18 NOTE — Progress Notes (Signed)
Primary care physician Dr. 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 He underwent an echocardiogram in March of 2015 which showed worsening of LV systolic function with an ejection fraction of 15%. Shortly after that, the patient started experiencing significant shortness of breath and cough with required 2 hospitalization at [facility]. I proceeded with a right left cardiac catheterization in July which showed only mildly elevated filling pressures with normal cardiac output and no significant pulmonary hypertension. Coronary angiography showed no obstructive coronary artery disease. Ejection fraction was 10-15%.  He continued to have sinus tachycardia and could not advance the dose of carvedilol due to hypotension. I added Ivabradine 5 mg twice daily the last visit. He had significant and gradual improvement since then. He feels significantly better.   Allergies  Allergen Reactions  . Lisinopril Hives    Hives   . Metoprolol Nausea And Vomiting    Nausea and vomiting   . Norvasc [Amlodipine Besylate] Other (See Comments)    unknown     Current Outpatient Prescriptions on File Prior to Visit  Medication Sig Dispense Refill  . albuterol (PROAIR HFA) 108 (90 BASE) MCG/ACT inhaler Inhale 2 puffs into the lungs every 6 (six) hours as needed for wheezing or shortness of breath.    Marland Kitchen aspirin 81 MG tablet Take 81 mg by mouth daily.    Marland Kitchen atorvastatin (LIPITOR) 40 MG tablet Take 40 mg by mouth daily.    . carvedilol (COREG) 3.125 MG tablet TAKE 1 TABLET BY MOUTH 2 TIMES A DAY 60 tablet 3  . digoxin (LANOXIN) 0.125 MG tablet Take 1 tablet (0.125 mg total) by mouth daily. 90 tablet 3  . Fluticasone-Salmeterol (ADVAIR) 250-50 MCG/DOSE AEPB Inhale 1 puff into the lungs 2 (two) times daily.    . ivabradine HCl (CORLANOR) 5  MG TABS tablet Take 1 tablet (5 mg total) by mouth 2 (two) times daily with a meal. 60 tablet 6  . omeprazole (PRILOSEC) 20 MG capsule Take 20 mg by mouth daily.    Marland Kitchen spironolactone (ALDACTONE) 25 MG tablet TAKE 1 TABLET BY MOUTH ONCE A DAY 30 tablet 3  . tamsulosin (FLOMAX) 0.4 MG CAPS capsule Take 0.4 mg by mouth daily.    Marland Kitchen tiotropium (SPIRIVA) 18 MCG inhalation capsule Place 18 mcg into inhaler and inhale daily.    . vitamin C (ASCORBIC ACID) 500 MG tablet Take 500 mg by mouth daily.     No current facility-administered medications on file prior to visit.     Past Medical History  Diagnosis Date  . History of BPH   . COPD (chronic obstructive pulmonary disease)   . Hypertension   . Hyperlipidemia   . Chronic back pain   . Pneumonia   . Heart murmur   . Chronic systolic heart failure     Ejection fraction of 15%  . Sinus tachycardia   . Left bundle branch block   . Kidney stone      Past Surgical History  Procedure Laterality Date  . Cardiac catheterization  2012    [facility]  . Cardiac catheterization      [facility]  . Left and right heart catheterization with coronary angiogram N/A 03/23/2014    Procedure: LEFT AND RIGHT HEART CATHETERIZATION WITH CORONARY ANGIOGRAM;  Surgeon: [name], MD;  Location: [facility] CATH LAB;  Service: Cardiovascular;  Laterality: N/A;     Family History  Problem Relation Age of Onset  . Heart disease Father   . Heart failure Father   . Hypertension Mother   . Hyperlipidemia Mother      History   Social History  . Marital Status: Married    Spouse Name: N/A    Number of Children: N/A  . Years of Education: N/A   Occupational History  . Not on file.   Social History Main Topics  . Smoking status: Former Smoker -- 0 years    Types: Cigarettes  . Smokeless tobacco: Former Neurosurgeon    Types: Snuff  . Alcohol Use: Yes     Comment: occasional  . Drug Use: No  . Sexual Activity: Not on file   Other Topics Concern  . Not on file    Social History Narrative     ROS A 10 point review of system was performed. It is negative other than that mentioned in the history of present illness.   PHYSICAL EXAM   BP 122/80 mmHg  Pulse 73  Ht 5\' 9"  (1.753 m)  Wt 202 lb (91.627 kg)  BMI 29.82 kg/m2 Constitutional: He is oriented to person, place, and time. He appears well-developed and well-nourished. No distress.  HENT: No nasal discharge.  Head: Normocephalic and atraumatic.  Eyes: Pupils are equal and round.  No discharge. Neck: Normal range of motion. Neck supple. No JVD present. No thyromegaly present.  Cardiovascular: Normal rate, regular rhythm, normal heart sounds. Exam reveals no gallop and no friction rub. There is a 3/6 systolic ejection murmur in the aortic area Pulmonary/Chest: Effort normal and breath sounds normal. No stridor. No respiratory distress. He has no wheezes. He has no rales. He exhibits no tenderness.  Abdominal: Soft. Bowel sounds are normal. He exhibits no distension. There is no tenderness. There is no rebound and no guarding.  Musculoskeletal: Normal range of motion. He exhibits no edema and no tenderness.  Neurological: He is alert and oriented to person, place, and time. Coordination normal.  Skin: Skin is warm and dry. No rash noted. He is not diaphoretic. No erythema. No pallor.  Psychiatric: He has a normal mood and affect. His behavior is normal. Judgment and thought content normal.          ASSESSMENT AND PLAN

## 2014-10-18 NOTE — Patient Instructions (Signed)
Your physician has recommended you make the following change in your medication: Start Losartan 25 mg once daily   Your physician recommends that you return for lab work in:  BMP in 1 week   Your physician recommends that you schedule a follow-up appointment in:  3 months

## 2014-10-21 DIAGNOSIS — I350 Nonrheumatic aortic (valve) stenosis: Secondary | ICD-10-CM | POA: Insufficient documentation

## 2014-10-21 NOTE — Assessment & Plan Note (Signed)
He appears to be euvolemic and currently New York Heart Association class II. Repeat echocardiogram today showed an ejection fraction of 30-35% with significant improvement in left ventricular end-diastolic dimension. I discussed with him the indication for an ICD placement as this was already addressed by [name] as well. Given the improvement in his symptoms and ejection fraction, the patient actually prefers to wait with longer medical therapy before making this decision. I added losartan today. Check basic metabolic profile in one week. He is otherwise on optimal medical therapy.

## 2014-10-21 NOTE — Assessment & Plan Note (Signed)
This was moderate on most recent echocardiogram. 

## 2014-10-25 ENCOUNTER — Other Ambulatory Visit (INDEPENDENT_AMBULATORY_CARE_PROVIDER_SITE_OTHER): Payer: Medicare Other

## 2014-10-25 DIAGNOSIS — I5022 Chronic systolic (congestive) heart failure: Secondary | ICD-10-CM

## 2014-10-26 LAB — BASIC METABOLIC PANEL
BUN/Creatinine Ratio: 16 (ref 10–22)
BUN: 13 mg/dL (ref 8–27)
CO2: 22 mmol/L (ref 18–29)
Calcium: 9.1 mg/dL (ref 8.6–10.2)
Chloride: 101 mmol/L (ref 97–108)
Creatinine, Ser: 0.81 mg/dL (ref 0.76–1.27)
GFR, EST AFRICAN AMERICAN: 101 mL/min/{1.73_m2} (ref >59)
GFR, EST NON AFRICAN AMERICAN: 87 mL/min/{1.73_m2} (ref >59)
Glucose: 83 mg/dL (ref 65–99)
POTASSIUM: 4.2 mmol/L (ref 3.5–5.2)
Sodium: 139 mmol/L (ref 134–144)

## 2014-10-26 NOTE — Telephone Encounter (Signed)
Prior auth for [employer] to [employer]

## 2014-10-28 ENCOUNTER — Telehealth: Payer: Self-pay | Admitting: *Deleted

## 2014-10-28 NOTE — Telephone Encounter (Signed)
Informed patient the paperwork for his Corlanor was sent yesterday 2/4  Patient verbalized understanding

## 2014-10-28 NOTE — Telephone Encounter (Signed)
Pt is calling stating that he received a letter stating that he was denied coverage on Carvedilol.  Please advise

## 2014-11-02 ENCOUNTER — Telehealth: Payer: Self-pay | Admitting: *Deleted

## 2014-11-02 NOTE — Telephone Encounter (Signed)
Patient has been approved for Corlanor starting 10/20/2014-09/23/15.

## 2014-11-03 ENCOUNTER — Telehealth: Payer: Self-pay | Admitting: *Deleted

## 2014-11-03 NOTE — Telephone Encounter (Signed)
Patient informed that he was approved for Corlanor  Patient verbalized understanding

## 2014-11-21 ENCOUNTER — Other Ambulatory Visit: Payer: Self-pay | Admitting: *Deleted

## 2014-11-21 MED ORDER — LOSARTAN POTASSIUM 25 MG PO TABS: 25.0000 mg | ORAL_TABLET | Freq: Every day | ORAL | Status: DC

## 2015-01-13 NOTE — Op Note (Signed)
PATIENT NAME:  Benjamin Frost, Benjamin Frost MR#:  [id number] DATE OF BIRTH:  1941-06-15  DATE OF PROCEDURE:  01/26/2013  PREOPERATIVE DIAGNOSIS: Visually significant cataract of the left eye.   POSTOPERATIVE DIAGNOSIS: Visually significant cataract of the left eye.   OPERATIVE PROCEDURE: Cataract extraction by phacoemulsification with implant of intraocular lens to left eye.   SURGEON: Galen Manila, MD.   ANESTHESIA:  1. Managed anesthesia care.  2. Topical tetracaine drops followed by 2% Xylocaine jelly applied in the preoperative holding area.   COMPLICATIONS: None.   TECHNIQUE:  Stop and chop.    DESCRIPTION OF PROCEDURE: The patient was examined and consented in the preoperative holding area where the aforementioned topical anesthesia was applied to the left eye and then brought back to the Operating Room where the left eye was prepped and draped in the usual sterile ophthalmic fashion and a lid speculum was placed. A paracentesis was created with the side port blade and the anterior chamber was filled with viscoelastic. A near clear corneal incision was performed with the steel keratome. A continuous curvilinear capsulorrhexis was performed with a cystotome followed by the capsulorrhexis forceps. Hydrodissection and hydrodelineation were carried out with BSS on a blunt cannula. The lens was removed in a stop and chop technique and the remaining cortical material was removed with the irrigation-aspiration handpiece. The capsular bag was inflated with viscoelastic and the Tecnis ZCB00 21.0-diopter lens, serial number [id number], was placed in the capsular bag without complication. The remaining viscoelastic was removed from the eye with the irrigation-aspiration handpiece. The wounds were hydrated. The anterior chamber was flushed with Miostat and the eye was inflated to physiologic pressure. 0.1 mL of cefuroxime concentration 10 mg/mL was placed in the anterior chamber. The wounds were found to be  water tight. The eye was dressed with Vigamox. The patient was given protective glasses to wear throughout the day and a shield with which to sleep tonight. The patient was also given drops with which to begin a drop regimen today and will follow-up with me in one day.    ____________________________ Jerilee Field. Pansie Guggisberg, MD wlp:gb D: 01/26/2013 19:36:07 ET T: 01/27/2013 04:22:45 ET JOB#: [id number]  cc: Iviana Blasingame L. Sully Dyment, MD, <Dictator> Jerilee Field Renatta Shrieves MD ELECTRONICALLY SIGNED 02/01/2013 14:35

## 2015-01-13 NOTE — Op Note (Signed)
PATIENT NAME:  Benjamin Frost, Benjamin Frost MR#:  [id number] DATE OF BIRTH:  19-Oct-1940  DATE OF PROCEDURE:  03/10/2013  PREOPERATIVE DIAGNOSIS: Visually significant cataract of the right eye.   POSTOPERATIVE DIAGNOSIS: Visually significant cataract of the right eye.   OPERATIVE PROCEDURE: Cataract extraction by phacoemulsification with implant of intraocular lens to right eye.   SURGEON: Galen Manila, MD   ANESTHESIA:  1. Managed anesthesia care.  2. Topical tetracaine drops followed by 2% Xylocaine jelly applied in the preoperative holding area.   COMPLICATIONS: None.   TECHNIQUE:  Stop and chop.   DESCRIPTION OF PROCEDURE: The patient was examined and consented in the preoperative holding area where the aforementioned topical anesthesia was applied to the right eye and then brought back to the Operating Room where the right eye was prepped and draped in the usual sterile ophthalmic fashion and a lid speculum was placed. A paracentesis was created with the side port blade and the anterior chamber was filled with viscoelastic. A near clear corneal incision was performed with the steel keratome. A continuous curvilinear capsulorrhexis was performed with a cystotome followed by the capsulorrhexis forceps. Hydrodissection and hydrodelineation were carried out with BSS on a blunt cannula. The lens was removed in a stop and chop technique and the remaining cortical material was removed with the irrigation-aspiration handpiece. The capsular bag was inflated with viscoelastic and the Technis ZCBOO     21.0-diopter lens, serial number [id number], was placed in the capsular bag without complication. The remaining viscoelastic was removed from the eye with the irrigation-aspiration handpiece. The wounds were hydrated. The anterior chamber was flushed with Miostat and the eye was inflated to physiologic pressure. 0.1 mL of cefuroxime concentration 10 mg/mL was placed in the anterior chamber. The wounds were found to  be water tight. The eye was dressed with Vigamox. The patient was given protective glasses to wear throughout the day and a shield with which to sleep tonight. The patient was also given drops with which to begin a drop regimen today and will follow-up with me in one day.   ____________________________ Jerilee Field. Sahithi Ordoyne, MD wlp:cb D: 03/10/2013 12:24:00 ET T: 03/10/2013 15:47:48 ET JOB#: [id number]  cc: Diasia Henken L. Arfa Lamarca, MD, <Dictator> Jerilee Field Llana Deshazo MD ELECTRONICALLY SIGNED 03/11/2013 14:40

## 2015-01-14 NOTE — Discharge Summary (Signed)
PATIENT NAME:  Benjamin Frost, Benjamin Frost MR#:  [id number] DATE OF BIRTH:  September 29, 1940  DATE OF ADMISSION:  03/12/2014 DATE OF DISCHARGE:  03/18/2014  PRIMARY CARE PHYSICIAN: Leanna Sato, MD  CHIEF COMPLAINT: Hypotension.   CONSULTANTS:  1. Yevonne Pax, MD 2. Dory Larsen, MD, pulmonary  3. Darlin Priestly Lady Gary, MD, cardiology   FINAL DIAGNOSES: 1. Acute respiratory failure due to chronic obstructive pulmonary disease exacerbation.   2. 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  3. Chronic tachycardia 4. Hypertension likely cardiogenic shock.  5. Benign prostatic hypertrophy.  6. Gastroesophageal reflux disease.  7. History of hypertension.  8. Chronic back pain.  9. History of cardiac murmur.   DISCHARGE MEDICATIONS: Tamsulosin 0.4 mg once a day, Bayer aspirin 81 mg once a day, atorvastatin 40 mg daily, Advair 250/50 mcg 1 puff 2 times a day, Spiriva 18 mcg 1 capsule inhaled once a day, omeprazole 20 mg daily, cetirizine 10 mg once a day, ProAir 2 puffs 4 times a day as needed for wheezing or shortness of breath, prednisone taper 40 mg for a day, then taper by 10 mg until done in 4 days, losartan 6.25 mg once a day. You may cut a 25 mg tablet 2 times. Digoxin 250 mcg 1 tablet once a day, diltiazem 30 mg every six hours. Please hold for 1 to 2 doses if systolic blood pressure is less than 90 and resume if improved over 90. Amoxicillin/clavulanate 500 mg 2 times a day for 4 days. He will be going home with home health nurse, 2 liters of oxygen via nasal cannula continuous.   DIET: Low sodium.   ACTIVITY: As tolerated.   FOLLOWUP: Please follow with PCP within 1 to 2 weeks. Please follow with cardiology as scheduled by you next week.   CODE STATUS: The patient is full code.   SIGNIFICANT LABORATORY DATA AND IMAGING: Initial BNP 10,232, BUN 22, creatinine 1.1. Troponin negative x3. CK-MB negative x3. Initial white count of 10.8,  hemoglobin 13, platelets 208,000. Blood cultures on arrival: No growth to date. Urinalysis did not suggest an infection. Cortisol appropriately elevated at 43 on 06/21. Initial ABG showed pH of 7.35, pCO2 of 43, pO2 of 123 on BiPAP. Echocardiogram showing EF of less than 20%, severely decreased LV function and moderate-to-severe increased LV internal cavity size, moderate-to-severe mitral regurgitation. CT chest without contrast: Minimum bilateral pleural effusions, 1-cm nodule as seen above the left hemidiaphragm, best seen in image 49, probable scarring in the inferior portion of the lingula; air-space opacity seen along the posterior portion of the right upper lobe concerning for pneumonia or atelectasis; a 29 x 19 mm ground-glass opacity in the left upper lobe,  8-mm nodule in the left posterior costophrenic sulci.   Chest x-ray, PA and lateral on 06/20: Emphysema with persistent peribronchial thickening and slight increased interstitial markings, which could be infectious or edema. More focal infiltrate in the left lower lobe, unchanged, and then the right upper lobe slightly increased.   HISTORY OF PRESENT ILLNESS AND HOSPITAL COURSE: For full details of H and P, please see the dictation on 06/20 by Dr. Judithann Sheen, but briefly, this is a 74 year old male with hospitalization for pneumonia with underlying chronic obstructive pulmonary disease. Of note, he had come in 3 weeks ago with pneumonia, discharged about 2 weeks ago or so. Was seen as an outpatient by Dr. Meredeth Ide and Dr. Gwen Pounds, his pulmonologist and cardiologist. He was given some Lasix and metoprolol.  He took a dose of metoprolol and became acutely short of breath, weak, diaphoretic; came into the hospital was found to be hypotensive and tachycardic. He was admitted to the hospitalist service in the Intensive Care Unit. He was also a short of breath. He did have acute respiratory failure requiring BiPAP. Also he did require pressors initially but now  is off.   We suspect his acute respiratory failure is secondary to chronic obstructive pulmonary disease exacerbation. He has significant dyspnea on exertion and he has been having that for some time. He was seen by pulmonary. He was started on Zosyn as he was recently on discharged on Levaquin and he had come back to the hospital. No particular focal pneumonia was noted on x-ray. He had no significant white count on arrival. He was transitioned onto room air and transitioned into Augmentin. He was also on IV steroids and by now they have been tapered to p.o. of, prednisone he will be discharged on prednisone taper. He has been off of BiPAP and transitioned to room air; however, his oxygen saturation does drop to about 80% with severe tachycardia on ambulation. He will be discharged on oxygen. In regards to the CAT scan findings of possible pneumonia on the right side, he has had no significant fevers, chills or increased leukocytosis to suggest he has significant infection and he will be going home with a 10-day course of antibiotics.   In regards to his cardiomyopathy, an echo was repeated which shows and EF of what 15%. He was seen by cardiology. At this point, appears to be compensated without significant crackles or lower extremity edema. Intolerant to beta blockers and ACE inhibitors. We have started him on a very low dose of losartan and he appears to be tolerating. He has also been started on digoxin by Dr. Lady Gary. I suspect that he has had severe cardiomyopathy for some time and if that is the case and can be documented, he is a candidate for an automatic implantable cardiac defibrillator/pacemaker. Due to his low blood pressure, cannot tolerate higher doses of rate controlling agents, and he is allergic or intolerant to beta blocker.  Furthermore, if the ejection fraction was shown to be low over multiple months, he would need an automatic implantable cardiac defibrillator.   In regards to his  hypertension, that has resolved. He did require pressors briefly, thus I suspect that this was likely cardiogenic shock rather than septic shock, as his blood cultures have been negative and he has no significant sources of infection.   In regards to the tachycardia, he was started on low-dose Cardizem. His blood pressure is labile and borderline and do not know how much of the Cardizem he can tolerate, but I am unable to transition the short-acting into a long-acting at this point, as I do not want a sustained low blood pressure in this patient. He was told to hold 1 or 2 doses of diltiazem if his blood pressure systolic less than 90. He was encouraged to buy a blood pressure cuff as well as pulse oximetry. Of note, he has had a cardiac catheterization several years ago, which did not show significant CAD, thus, it is possible that the cardiomyopathy might be nonischemic and could be tachycardia induced; therefore, rate controlling importance in this patient.   He will be discharged with oxygen, continuous, 2 liters with home health nurse.   PHYSICAL EXAMINATION: VITAL SIGNS: On the day of discharge: Temperature is 98.2, pulse rate 119, respiratory rate 20,  blood pressure 99/67, oxygen saturation 98% on 2 liters.  GENERAL: The patient is a well-developed male, pleasant, cooperative.  LUNGS: Clear to auscultation without wheezing, rhonchi, or rales.  ABDOMEN: Soft, nontender, and nondistended. Normal S1, S2, tachycardic and no pitting edema. The abdomen is benign.   TOTAL TIME SPENT: About 40 minutes.   ____________________________ Krystal Eaton, MD sa:lm D: 03/18/2014 13:31:00 ET T: 03/19/2014 00:50:18 ET JOB#: [id number]  cc: Leanna Sato, MD Krystal Eaton, MD, <Dictator> Herbon E. Meredeth Ide, MD    Krystal Eaton MD ELECTRONICALLY SIGNED 03/23/2014 13:58

## 2015-01-14 NOTE — Discharge Summary (Signed)
PATIENT NAME:  Benjamin Frost, Benjamin Frost MR#:  [id number] DATE OF BIRTH:  28-May-1941  DATE OF ADMISSION:  02/27/2014 DATE OF DISCHARGE:  03/01/2014  PRIMARY CARE PHYSICIAN: Dr. Darreld Mclean  DISCHARGE DIAGNOSES:  1. Chronic obstructive pulmonary disease exacerbation with possible pneumonia.  2. Hypertension. 3. Hyperlipidemia.   CONDITION: Stable.   CODE STATUS: Full code.   HOME MEDICATIONS: Please refer to the medication reconciliation list.   DIET: Low-sodium, low-fat, low-cholesterol diet.   ACTIVITY: As tolerated.   FOLLOWUP CARE: Follow up with PCP within 1-2 weeks. Follow up with Dr. Meredeth Ide within 1-2 weeks.   REASON FOR ADMISSION: Cough, shortness of breath.   HOSPITAL COURSE:  Patient is a 74 year old Caucasian male with a history of chronic obstructive pulmonary disease, hypertension, hyperlipidemia, benign prostatic hypertrophy, presented in the ED with shortness of breath, cough, wheeze, yellowish sputum, and a low grade fever. The patient also has progressive wheezing. For detailed history and physical examination, please refer to the admission note dictated by Dr. Auburn Bilberry. The patient's laboratory data was unremarkable. Chest x-ray showed mild increased interstitial prominence concerning edema, some superimposed chronic disease,  mild bibasilar airspace opacity.  1. Chronic obstructive pulmonary disease exacerbation with possible pneumonia. After admission, the patient had been treated with oxygen by nasal cannula, IV Levaquin, solumedrol.  In addition, patient was treated with Advair and Spiriva. The patient's symptoms have much improved. He is off oxygen without shortness of breath, only has cough.  2. Hypertension is under control. The patient's vital signs are stable. He is clinically stable, will be discharged home today. I discussed the patient's discharge plan with the patient, nurse, case manager.   TIME SPENT: About 35 minutes.    ____________________________ Shaune Pollack, MD qc:dd D: 03/01/2014 16:40:00 ET T: 03/01/2014 20:18:46 ET JOB#: [id number]  cc: Shaune Pollack, MD, <Dictator> Shaune Pollack MD ELECTRONICALLY SIGNED 03/02/2014 15:07

## 2015-01-14 NOTE — Consult Note (Signed)
   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 CXR in er showed probable mild volume overload. Pt was seen in out patient office last week and placed on metoprolol and furosamide. Pt feels that the new meds caused his symptoms. He states he was on similar med before and had a similar problem. He has ruled out for an mi thus far. He has improved since admission . Now somewhat tachycardic. Echo reveals his dilated cardiomyopathy with ef less than 20%. He was hypotensive on admission and is currently on low dose levophed with MAP at 80.   Physical Exam:  GEN obese   HEENT PERRL, hearing intact to voice   NECK supple   RESP no use of accessory muscles  rhonchi  crackles   CARD Regular rate and rhythm  Tachycardic   ABD denies tenderness  no liver/spleen enlargement  distended  normal BS  no Adominal Mass   LYMPH negative neck, negative axillae   EXTR negative cyanosis/clubbing, negative edema   SKIN normal to palpation   NEURO cranial nerves intact   PSYCH A+O to time, place, person   Review of Systems:  Subjective/Chief Complaint sob   General: Fatigue  Weakness   Skin: No Complaints   ENT: No Complaints   Eyes: No Complaints   Neck: No Complaints   Respiratory: Frequent cough  Short of breath  Sputum   Cardiovascular: Dyspnea  Edema   Gastrointestinal: No Complaints   Genitourinary: No Complaints   Vascular: No Complaints   Musculoskeletal: No Complaints   Neurologic: No Complaints   Hematologic: No Complaints   Endocrine: No Complaints   Psychiatric: No Complaints   Review of Systems: All other systems were reviewed and found to be negative   Medications/Allergies Reviewed Medications/Allergies reviewed   Family & Social History:  Family and Social History:  Family History  Non-Contributory   Social History positive tobacco (Greater than 1 year), negative ETOH   EKG:  Abnormal NSSTTW changes   Interpretation tachycardia, appears sinus    Metoprolol: Resp. Distress, Hypotension, Wheezing, Tachycardia  Lisinopril: Hives   Impression 74 yo male with history of cardiomyopathy with known ef of 15% who was recently admitted with pneumonia and wwas discharged 2 weeks ago. He was seen by primary cardiologist and had metoprolol and furosemide added. He states he became more sob after the meds were changed. He has ruled out for mi. ECHO unchanged form baseline. CXR shows emphysema and possible mild vlume overload but no florrid edema. Will need ot carefully diurese. Also, will wean levophed keeping MAP greater than 65. Will need to be careful with beta blokers. Pt feels he did not tolerate beta blockers in the past. Has chronic systollic heart failure at NYHA III. This event appeaers to have a significant bronchospastic component. Agree with steroids and emperic abx.   Plan 1. Wean levophed keeping MAP greater than 65 2. Continue iwth prednisone and abx. 3. Avoid diuresis for now following symptoms, exam and cxr 4. Will defer beeta blockers for now due to probable intolerance and hypotension. 5. Further recs pending course.   Electronic Signatures: Dalia Heading (MD)  (Signed 21-Jun-15 09:39)  Authored: General Aspect/Present Illness, History and Physical Exam, Review of System, Family & Social History, EKG , Allergies, Impression/Plan   Last Updated: 21-Jun-15 09:39 by Dalia Heading (MD)

## 2015-01-14 NOTE — Consult Note (Signed)
Chief Complaint:  Subjective/Chief Complaint awake and alert this am off the BIPAP   VITAL SIGNS/ANCILLARY NOTES: **Vital Signs.:   21-Jun-15 07:00  Vital Signs Type Routine  Temperature Temperature (F) 97.7  Celsius 36.5  Temperature Source oral  Pulse Pulse 97  Respirations Respirations 22  Systolic BP Systolic BP 462  Diastolic BP (mmHg) Diastolic BP (mmHg) 74  Mean BP 84  Pulse Ox % Pulse Ox % 96  Oxygen Delivery 2L; Nasal Cannula  *Intake and Output.:   Shift 21-Jun-15 15:00  Grand Totals Intake:  483.8 Output:  300    Net:  183.8 24 Hr.:  183.8  Oral Intake      In:  360  IV (Secondary)      In:  75  IV (Secondary)      In:  48.8  Urine ml     Out:  300  Length of Stay Totals Intake:  984.7 Output:  1600    Net:  -615.3   Brief Assessment:  GEN no acute distress   Cardiac Regular  -- JVD   Respiratory normal resp effort  clear BS  no use of accessory muscles   Gastrointestinal details normal Soft  Bowel sounds normal  No organomegaly   EXTR negative cyanosis/clubbing   Lab Results: Thyroid:  21-Jun-15 06:58   Thyroid Stimulating Hormone 0.455 (0.45-4.50 (International Unit)  ----------------------- Pregnant patients have  different reference  ranges for TSH:  - - - - - - - - - -  Pregnant, first trimetser:  0.36 - 2.50 uIU/mL)  Lab:  21-Jun-15 04:55   O2 Saturation (Pulse Ox) 96  FiO2 (Pulse Ox) Room air (Result(s) reported on 13 Mar 2014 at 05:44AM.)  Routine Chem:  21-Jun-15 06:58   Glucose, Serum  173  BUN  22  Creatinine (comp) 1.07  Sodium, Serum 138  Potassium, Serum  3.3  Chloride, Serum 103  CO2, Serum 28  Calcium (Total), Serum  8.1  Anion Gap 7  Osmolality (calc) 283  eGFR (African American) >60  eGFR (Non-African American) >60 (eGFR values <49m/min/1.73 m2 may be an indication of chronic kidney disease (CKD). Calculated eGFR is useful in patients with stable renal function. The eGFR calculation will not be reliable in  acutely ill patients when serum creatinine is changing rapidly. It is not useful in  patients on dialysis. The eGFR calculation may not be applicable to patients at the low and high extremes of body sizes, pregnant women, and vegetarians.)  Routine UA:  21-Jun-15 01:13   Color (UA) Yellow  Clarity (UA) Clear  Glucose (UA) Negative  Bilirubin (UA) Negative  Ketones (UA) Negative  Specific Gravity (UA) 1.012  Blood (UA) Negative  pH (UA) 5.0  Protein (UA) Negative  Nitrite (UA) Negative  Leukocyte Esterase (UA) Negative (Result(s) reported on 13 Mar 2014 at 02:17AM.)  RBC (UA) 1 /HPF  WBC (UA) 9 /HPF  Bacteria (UA) TRACE  Epithelial Cells (UA) <1 /HPF  Mucous (UA) PRESENT  Hyaline Cast (UA) 5 /LPF (Result(s) reported on 13 Mar 2014 at 02:17AM.)  Routine Hem:  21-Jun-15 06:58   WBC (CBC) 6.6  RBC (CBC)  4.13  Hemoglobin (CBC)  12.3  Hematocrit (CBC)  37.8  Platelet Count (CBC) 193  MCV 92  MCH 29.8  MCHC 32.6  RDW 14.2  Neutrophil % 93.2  Lymphocyte % 5.6  Monocyte % 0.9  Eosinophil % 0.0  Basophil % 0.3  Neutrophil # 6.2  Lymphocyte #  0.4  Monocyte #  0.1  Eosinophil # 0.0  Basophil # 0.0 (Result(s) reported on 13 Mar 2014 at 07:38AM.)   Assessment/Plan:  Assessment/Plan:  Assessment 1. Acute resp failure -clinically improving -will keep BIPAP in room for now  2. COPD exacerbation -slowly improving   Electronic Signatures: Allyne Gee (MD)  (Signed 21-Jun-15 11:36)  Authored: Chief Complaint, VITAL SIGNS/ANCILLARY NOTES, Brief Assessment, Lab Results, Assessment/Plan   Last Updated: 21-Jun-15 11:36 by Allyne Gee (MD)

## 2015-01-14 NOTE — H&P (Signed)
PATIENT NAME:  Benjamin Frost, [name] MR#:  [id number] DATE OF BIRTH:  Feb 05, 1941  DATE OF ADMISSION:  02/27/2014  PRIMARY CARE PROVIDER: Dorothea Glassman, MD  EMERGENCY DEPARTMENT REFERRING PHYSICIAN: Darien Ramus, MD  CHIEF COMPLAINT: Shortness of breath, cough.   HISTORY OF PRESENT ILLNESS: The patient is a 74 year old white male with history of chronic obstructive pulmonary disease for 5 years, also has history of hypertension, hyperlipidemia, benign prostatic hypertrophy, who states that he started having shortness of breath and cough about 6 weeks ago and has had progression of his symptoms. Cough is yellow, sometimes it is dark brown in color. He was seen by pulmonary, Dr. Meredeth Ide, recently and was started on a prednisone taper which he finished about a week ago. He continues to feel short of breath and cough. He also had a low-grade temperature yesterday. He otherwise has not had any chest pain, palpitations. He has also been having progressive wheezing. He denies any lower extremity swelling. He sleeps in a recliner due to him having chronic back pain.  That also helps with his breathing.   PAST MEDICAL HISTORY: 1.  History of chronic obstructive pulmonary disease diagnosed for 5 years.  2.  Benign prostatic hypertrophy.  3.  Hypertension.  4.  Hyperlipidemia.  5.  Chronic back pain.  6.  History of heart murmur.  7.  A cardiac catheter done 2 years ago which showed no obstruction per patient.   PAST SURGICAL HISTORY: None.   ALLERGIES: LISINOPRIL, METOPROLOL.   CURRENT MEDICATIONS AT HOME: Atorvastatin 40 daily, omeprazole 20 daily, aspirin 81 mg 1 tab p.o. daily, Flomax 0.4 daily, triamterene/HCTZ 37.5/25 p.o. daily, fluticasone 50 mcg daily, cetirizine 10 daily.   SOCIAL HISTORY: Was smoking but quit 25 years ago. No alcohol. Lives with his wife at home.   FAMILY HISTORY: Positive for hypertension.   REVIEW OF SYSTEMS:    CONSTITUTIONAL: Complains of subjective fever, fatigue,  weakness. No pain. No weight loss. No weight gain.  EYES: No blurred or double vision. No pain. No redness or inflammation.  ENT: No tinnitus. No ear pain. No hearing loss. No seasonal or year-round allergies. No epistaxis. No difficulty swallowing.  RESPIRATORY: Complains of cough, some wheezing. No hemoptysis. Complains of dyspnea on exertion. Has chronic obstructive pulmonary disease.  CARDIOVASCULAR: Denies any chest pain, orthopnea, edema. No arrhythmia. Complains of dyspnea on exertion. No palpitation. No syncope.  GASTROINTESTINAL: No nausea, vomiting, diarrhea. No abdominal pain. No hematemesis, melena, ulcer. No gastroesophageal reflux disease.  No irritable bowel syndrome. No jaundice.  GENITOURINARY: He denies any dysuria, hematuria, renal calculus.  ENDOCRINE:  Denies any polyuria, nocturia or thyroid problems.  HEMATOLOGIC AND LYMPHATIC: Denies anemia, easy bruisability or bleeding.  SKIN: No acne. No rash. No changes in mole, hair or skin.  MUSCULOSKELETAL: No pain in the neck, back or shoulder. No gout.  NEUROLOGIC: No numbness, cerebrovascular accident, transient ischemic attack or seizures.  PSYCHIATRIC: No anxiety, insomnia or attention deficit disorder.   PHYSICAL EXAMINATION:  VITAL SIGNS: Temperature 98.7, pulse 110, respirations 20, blood pressure 101/74, O2 91% on room air.  GENERAL: The patient is in no acute distress.  HEENT: Head atraumatic, normocephalic. Pupils equally round, reactive to light and accommodation. There is no conjunctival pallor. No scleral icterus. Nasal exam shows no drainage or ulceration. Oropharynx is clear without any exudate.  NECK: Supple without any JVD.  CARDIOVASCULAR: Regular rate and rhythm. He is tachycardic. No gallops. No murmurs.  LUNGS: He has got diminished breath sounds with  occasional wheezing. There is no accessory muscle usage.  ABDOMEN: Soft, nontender, nondistended. Positive bowel sounds x 4.  EXTREMITIES: No clubbing, cyanosis  or edema.  SKIN: No rash.  LYMPHATICS: No lymph nodes palpable.  VASCULAR: Good DP, PT pulses.  PSYCHIATRIC: Not anxious or depressed.  NEUROLOGIC: Awake, alert, oriented x 3. No focal deficits.   LABORATORY, DIAGNOSTIC AND RADIOLOGICAL DATA:  Glucose 112, BUN 15, creatinine 0.85, sodium 134, potassium 3.2, chloride 96, CO2 28, calcium 9.1. WBC 10.1, hemoglobin 14.3, platelet count is 215. Chest x-ray, PA and lateral, shows mildly increased interstitial prominence concerning for edema superimposed on chronic disease, mild basilar airspace opacity, left greater than right, which likely represents atelectasis, early infection not completely ruled out.   ASSESSMENT AND PLAN: The patient is a 74 year old white male with history of chronic obstructive pulmonary disease, hypertension, benign prostatic hypertrophy, presents with cough, shortness of breath, elevated heart rate and low blood pressure.  1.  Shortness of breath likely due to a combination of chronic obstructive pulmonary disease flare and possible pneumonia. At this time, we will treat him with IV Levaquin, place him on Xopenex nebulizer in light of elevated heart rate and IV Solu-Medrol. We will continue his Advair and Spiriva as taking at home.  2.  Hypertension. We will HCTZ/triamterene and give him low-dose IV fluids in light of chest x-ray findings. Will check a BNP level. Monitor his respiratory status, in and out. If he starts having any crackles or increase in shortness of breath, stop the fluids.  3.  Hyperlipidemia. Continue atorvastatin.  4.  Benign prostatic hypertrophy. Continue Flomax.  5.  Hypokalemia. Will replace his potassium.  6.  Miscellaneous: We will do Lovenox for deep vein thrombosis prophylaxis.   TIME SPENT: 50 minutes.    ____________________________ Lacie Scotts Allena Katz, MD shp:cs D: 02/27/2014 15:35:49 ET T: 02/27/2014 18:04:38 ET JOB#: [id number]  cc: Matheau Orona H. Allena Katz, MD, <Dictator> Charise Carwin  MD ELECTRONICALLY SIGNED 03/08/2014 16:52

## 2015-01-14 NOTE — H&P (Signed)
PATIENT NAME:  Benjamin Frost, Benjamin Frost MR#:  [id number] DATE OF BIRTH:  [birth year]  DATE OF ADMISSION:  03/12/2014  REFERRING PHYSICIAN: Lurena Joiner L. Shaune Pollack, MD  FAMILY PHYSICIAN: Leanna Sato, MD  REASON FOR ADMISSION: Hypotension.   HISTORY OF PRESENT ILLNESS: The patient is a 74 year old male recently hospitalized with pneumonia. He has underlying COPD. He presented approximately 3 weeks ago with pneumonia and was discharged less than 2 weeks ago. Was seen as an outpatient by Dr. Meredeth Ide and Dr. Gwen Pounds. Dr. Gwen Pounds saw the patient for tachycardia. He was given Lasix and metoprolol. Took one dose of metoprolol and became acutely short of breath, weak, diaphoretic, and nauseated. Presented to the Emergency Room where he was found to be hypotensive and tachycardic. Chest x-ray revealed worsening pneumonia with bilateral infiltrates. He is now admitted for further evaluation.   PAST MEDICAL HISTORY: 1.  COPD.  2.  BPH.  3.  Benign hypertension.  4.  Hyperlipidemia.  5.  Chronic back pain.  6.  Tachycardia.  7.  History of cardiac murmur.  8.  History of cardiac catheterization 2 years ago, which revealed no obstruction per patient.   MEDICATIONS: 1.  Flomax 0.4 mg p.o. daily.  2.  Spiriva 1 capsule inhaled daily.  3.  Ventolin 2 puffs q.4 hours p.r.n. shortness of breath.  4.  Prednisone taper as directed.  5.  Omeprazole 20 mg p.o. daily.  6.  Dyazide 1 p.o. daily.  7.  Flonase 2 puffs in each nostril daily.  8.  Zyrtec 10 mg p.o. daily.  9.  Advair 250/50, 1 puff b.i.d.  10.  Aspirin 81 mg p.o. daily.  11.  Lipitor 40 mg p.o. at bedtime.   ALLERGIES: METOPROLOL AND LISINOPRIL.   SOCIAL HISTORY: The patient is a former smoker, but none recently. Denies alcohol abuse.   FAMILY HISTORY: Positive for hypertension, but otherwise unremarkable.   REVIEW OF SYSTEMS:    CONSTITUTIONAL: No fever or change in weight.  EYES: No blurred or double vision. No glaucoma.  EARS, NOSE, THROAT: No  tinnitus or hearing loss. No nasal discharge or bleeding. No difficulty swallowing.  RESPIRATORY: The patient has had cough and wheezing. Denies hemoptysis. No painful respiration.  CARDIOVASCULAR: No chest pain or orthopnea. No syncope.  GASTROINTESTINAL: The patient has had nausea, but no vomiting or diarrhea. No abdominal pain.  GENITOURINARY: No dysuria, hematuria, or incontinence.  ENDOCRINE: No polyuria or polydipsia. No heat or cold intolerance.  HEMATOLOGIC: The patient denies anemia, easy bruising, or bleeding.  LYMPHATIC: No swollen glands.  MUSCULOSKELETAL: The patient denies pain in his neck, back, shoulders, knees, or hips. No gout.  NEUROLOGIC: No numbness or migraines. Denies stroke or seizures.  PSYCHIATRIC: The patient denies anxiety, insomnia, or depression.   PHYSICAL EXAMINATION: GENERAL: The patient is in no acute distress.  VITAL SIGNS: Currently remarkable for a blood pressure of 81/64 with a heart rate of 106, respiratory rate of 18, temperature of 97.7, sat 97% on room air.  HEENT: Normocephalic, atraumatic. Pupils are equal, round, and reactive to light and accommodation. Extraocular movements are intact. Sclerae are anicteric. Conjunctivae are clear. Oropharynx is dry but clear.  NECK: Supple without JVD. No adenopathy or thyromegaly is noted.  LUNGS: Scattered rhonchi bilaterally. No wheezes or rales. No dullness. Respiratory effort is normal.  CARDIAC: Rapid rate with a regular rhythm. Normal S1, S2. No significant rubs or gallops noted. PMI is nondisplaced. Chest wall is nontender.  ABDOMEN: Soft, nontender, with normoactive bowel sounds. No  organomegaly or masses were appreciated. No hernias or bruits were noted.  EXTREMITIES: Without clubbing, cyanosis, or edema. Pulses were 2+ bilaterally.  SKIN: Warm and dry without rash or lesions.  NEUROLOGIC: Cranial nerves II through XII grossly intact. Deep tendon reflexes were symmetric. Motor and sensory exams nonfocal.   PSYCHIATRIC: Revealed a patient who was alert and oriented to person, place, and time. He was cooperative and used good judgment.   LABORATORY, DIAGNOSTIC, AND RADIOLOGICAL DATA: EKG revealed sinus tachycardia with a left bundle branch block. Chest x-ray reveals bilateral pneumonia. White count 10.8 with a hemoglobin of 13.0. Glucose 122 with a BUN of 22, creatinine of 1.12, and a GFR of greater than 60. His troponin is 0.04. BNP is elevated at 10,232.   ASSESSMENT: 1.  Hypotension.  2.  Tachycardia.  3.  Bilateral pneumonia.  4.  Chronic obstructive pulmonary disease exacerbation.  5.  Elevated BNP of unclear significance.  6.  Mild dehydration.   PLAN: The patient will be admitted to the Intensive Care Unit with IV fluids and IV antibiotics. His cultures have been sent. We will begin pressors if needed, if the patient does not respond to volume resuscitation. We will follow serial cardiac enzymes and obtain an echocardiogram. We will check a TSH and a cortisol level. We will maximize his pulmonary regimen at this time, including IV steroids and DuoNeb SVNs. We will consult pulmonology and cardiology. Follow up routine labs in the morning. Further treatment and evaluation will depend upon the patient's progress.   TOTAL TIME SPENT ON THIS PATIENT: 50 minutes.    ____________________________ Duane Lope Judithann Sheen, MD jds:jcm D: 03/12/2014 15:56:21 ET T: 03/12/2014 17:43:17 ET JOB#: [id number]  cc: Duane Lope. Judithann Sheen, MD, <Dictator> Leanna Sato, MD JEFFREY Rodena Medin MD ELECTRONICALLY SIGNED 03/12/2014 18:23

## 2015-01-17 ENCOUNTER — Ambulatory Visit (INDEPENDENT_AMBULATORY_CARE_PROVIDER_SITE_OTHER): Payer: Medicare Other | Admitting: Cardiovascular Disease

## 2015-01-17 ENCOUNTER — Encounter: Payer: Self-pay | Admitting: Cardiovascular Disease

## 2015-01-17 VITALS — BP 120/80 | HR 78 | Ht 71.0 in | Wt 195.5 lb

## 2015-01-17 DIAGNOSIS — I350 Nonrheumatic aortic (valve) stenosis: Secondary | ICD-10-CM

## 2015-01-17 DIAGNOSIS — I5022 Chronic systolic (congestive) heart failure: Secondary | ICD-10-CM

## 2015-01-17 NOTE — Assessment & Plan Note (Signed)
This was moderate by most recent echocardiogram in January. Continue to monitor.

## 2015-01-17 NOTE — Patient Instructions (Signed)
Medication Instructions: - no changes  Labwork: - none  Procedures/Testing: - none  Follow-Up: - Your physician wants you to follow-up in: 6 months with [name]. You will receive a reminder letter in the mail two months in advance. If you don't receive a letter, please call our office to schedule the follow-up appointment.  Any Additional Special Instructions Will Be Listed Below (If Applicable). - none

## 2015-01-17 NOTE — Assessment & Plan Note (Signed)
He is doing very well overall and currently in [place] Heart Association class II. He is on optimal medical therapy. Basic metabolic profile was normal in February. I recommend continuing same medications.

## 2015-01-17 NOTE — Progress Notes (Signed)
Primary care physician Dr. 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 He has moderate aortic stenosis as well. He underwent an echocardiogram in March of 2015 which showed worsening of LV systolic function with an ejection fraction of 15%. Shortly after that, the patient started experiencing significant shortness of breath and cough with required 2 hospitalization at [facility]. I proceeded with a right left cardiac catheterization in July, 2015 which showed only mildly elevated filling pressures with normal cardiac output and no significant pulmonary hypertension. Coronary angiography showed no obstructive coronary artery disease. Ejection fraction was 10-15%.  He continued to have sinus tachycardia and could not advance the dose of carvedilol due to hypotension. I added Ivabradine 5 mg twice daily with gradual improvement since then. Most recent echocardiogram in January 2016 showed an ejection fraction of 30-35%. Losartan was added during last visit. He is doing very well overall and denies any chest pain or significant shortness of breath. No orthopnea, or PND. He is sleeping very well at night.   Allergies  Allergen Reactions  . Lisinopril Hives    Hives   . Metoprolol Nausea And Vomiting    Nausea and vomiting   . Norvasc [Amlodipine Besylate] Other (See Comments)    unknown     Current Outpatient Prescriptions on File Prior to Visit  Medication Sig Dispense Refill  . albuterol (PROAIR HFA) 108 (90 BASE) MCG/ACT inhaler Inhale 2 puffs into the lungs every 6 (six) hours as needed for wheezing or shortness of breath.    Marland Kitchen aspirin 81 MG tablet Take 81 mg by mouth daily.    Marland Kitchen atorvastatin (LIPITOR) 40 MG tablet Take 40 mg by mouth daily.    . carvedilol (COREG) 3.125 MG tablet TAKE 1 TABLET BY MOUTH 2 TIMES A  DAY 60 tablet 3  . digoxin (LANOXIN) 0.125 MG tablet Take 1 tablet (0.125 mg total) by mouth daily. 90 tablet 3  . Fluticasone-Salmeterol (ADVAIR) 250-50 MCG/DOSE AEPB Inhale 1 puff into the lungs 2 (two) times daily.    . ivabradine HCl (CORLANOR) 5 MG TABS tablet Take 1 tablet (5 mg total) by mouth 2 (two) times daily with a meal. 60 tablet 6  . losartan (COZAAR) 25 MG tablet Take 1 tablet (25 mg total) by mouth daily. 30 tablet 3  . omeprazole (PRILOSEC) 20 MG capsule Take 20 mg by mouth daily.    Marland Kitchen spironolactone (ALDACTONE) 25 MG tablet TAKE 1 TABLET BY MOUTH ONCE A DAY 30 tablet 3  . tamsulosin (FLOMAX) 0.4 MG CAPS capsule Take 0.4 mg by mouth daily.    Marland Kitchen tiotropium (SPIRIVA) 18 MCG inhalation capsule Place 18 mcg into inhaler and inhale daily.    . vitamin C (ASCORBIC ACID) 500 MG tablet Take 500 mg by mouth daily.     No current facility-administered medications on file prior to visit.     Past Medical History  Diagnosis Date  . History of BPH   . COPD (chronic obstructive pulmonary disease)   . Hypertension   . Hyperlipidemia   . Chronic back pain   . Pneumonia   . Heart murmur   . Chronic systolic heart failure     Ejection fraction of 15%  . Sinus tachycardia   . Left bundle branch block   . Kidney stone  Past Surgical History  Procedure Laterality Date  . Cardiac catheterization  2012    [facility]  . Cardiac catheterization      [facility]  . Left and right heart catheterization with coronary angiogram N/A 03/23/2014    Procedure: LEFT AND RIGHT HEART CATHETERIZATION WITH CORONARY ANGIOGRAM;  Surgeon: [name], MD;  Location: [facility] CATH LAB;  Service: Cardiovascular;  Laterality: N/A;     Family History  Problem Relation Age of Onset  . Heart disease Father   . Heart failure Father   . Hypertension Mother   . Hyperlipidemia Mother      History   Social History  . Marital Status: Married    Spouse Name: N/A  . Number of Children: N/A  . Years of  Education: N/A   Occupational History  . Not on file.   Social History Main Topics  . Smoking status: Former Smoker -- 0 years    Types: Cigarettes  . Smokeless tobacco: Former Neurosurgeon    Types: Snuff  . Alcohol Use: Yes     Comment: occasional  . Drug Use: No  . Sexual Activity: Not on file   Other Topics Concern  . Not on file   Social History Narrative     ROS A 10 point review of system was performed. It is negative other than that mentioned in the history of present illness.   PHYSICAL EXAM   BP 120/80 mmHg  Pulse 78  Ht  (1.803 m)  Wt 195 lb 8 oz (88.678 kg)  BMI 27.28 kg/m2 Constitutional: He is oriented to person, place, and time. He appears well-developed and well-nourished. No distress.  HENT: No nasal discharge.  Head: Normocephalic and atraumatic.  Eyes: Pupils are equal and round.  No discharge. Neck: Normal range of motion. Neck supple. No JVD present. No thyromegaly present.  Cardiovascular: Normal rate, regular rhythm, normal heart sounds. Exam reveals no gallop and no friction rub. There is a 3/6 systolic ejection murmur in the aortic area Pulmonary/Chest: Effort normal and breath sounds normal. No stridor. No respiratory distress. He has no wheezes. He has no rales. He exhibits no tenderness.  Abdominal: Soft. Bowel sounds are normal. He exhibits no distension. There is no tenderness. There is no rebound and no guarding.  Musculoskeletal: Normal range of motion. He exhibits no edema and no tenderness.  Neurological: He is alert and oriented to person, place, and time. Coordination normal.  Skin: Skin is warm and dry. No rash noted. He is not diaphoretic. No erythema. No pallor.  Psychiatric: He has a normal mood and affect. His behavior is normal. Judgment and thought content normal.          ASSESSMENT AND PLAN

## 2015-02-16 ENCOUNTER — Other Ambulatory Visit: Payer: Self-pay | Admitting: Cardiovascular Disease

## 2015-03-30 ENCOUNTER — Other Ambulatory Visit: Payer: Self-pay | Admitting: *Deleted

## 2015-03-30 MED ORDER — DIGOXIN 125 MCG PO TABS: 0.1250 mg | ORAL_TABLET | Freq: Every day | ORAL | Status: DC

## 2015-05-25 ENCOUNTER — Other Ambulatory Visit: Payer: Self-pay

## 2015-05-25 MED ORDER — IVABRADINE HCL 5 MG PO TABS: 5.0000 mg | ORAL_TABLET | Freq: Two times a day (BID) | ORAL | Status: DC

## 2015-05-25 NOTE — Telephone Encounter (Signed)
Refill sent for Corlanor 5 mg  

## 2015-05-26 ENCOUNTER — Telehealth: Payer: Self-pay | Admitting: *Deleted

## 2015-05-26 ENCOUNTER — Other Ambulatory Visit: Payer: Self-pay | Admitting: *Deleted

## 2015-05-26 MED ORDER — IVABRADINE HCL 5 MG PO TABS: 5.0000 mg | ORAL_TABLET | Freq: Two times a day (BID) | ORAL | Status: DC

## 2015-05-26 NOTE — Telephone Encounter (Signed)
°  1. Which medications need to be refilled?Corlanor 5 mg  2. Which pharmacy is medication to be sent to? [facility]   3. Do they need a 30 day or 90 day supply? 90 day   4. Would they like a call back once the medication has been sent to the pharmacy? Yes for the pharmacy there closes at 12 PM

## 2015-05-26 NOTE — Telephone Encounter (Signed)
Corlanor 5 mg #180 R#3 sent to [facility].

## 2015-06-23 ENCOUNTER — Other Ambulatory Visit: Payer: Self-pay | Admitting: *Deleted

## 2015-06-23 MED ORDER — SPIRONOLACTONE 25 MG PO TABS: 25.0000 mg | ORAL_TABLET | Freq: Every day | ORAL | Status: DC

## 2015-06-23 MED ORDER — CARVEDILOL 3.125 MG PO TABS: 3.1250 mg | ORAL_TABLET | Freq: Two times a day (BID) | ORAL | Status: DC

## 2015-07-17 ENCOUNTER — Encounter: Payer: Self-pay | Admitting: Cardiovascular Disease

## 2015-07-17 ENCOUNTER — Ambulatory Visit (INDEPENDENT_AMBULATORY_CARE_PROVIDER_SITE_OTHER): Payer: Medicare Other | Admitting: Cardiovascular Disease

## 2015-07-17 VITALS — BP 120/78 | HR 69 | Ht 70.0 in | Wt 199.8 lb

## 2015-07-17 DIAGNOSIS — I447 Left bundle-branch block, unspecified: Secondary | ICD-10-CM | POA: Diagnosis not present

## 2015-07-17 DIAGNOSIS — I350 Nonrheumatic aortic (valve) stenosis: Secondary | ICD-10-CM | POA: Diagnosis not present

## 2015-07-17 DIAGNOSIS — I5022 Chronic systolic (congestive) heart failure: Secondary | ICD-10-CM | POA: Diagnosis not present

## 2015-07-17 DIAGNOSIS — R000 Tachycardia, unspecified: Secondary | ICD-10-CM | POA: Diagnosis not present

## 2015-07-17 NOTE — Patient Instructions (Signed)
Medication Instructions: Continue same medications.   Labwork: None.   Procedures/Testing: None.   Follow-Up: 6 months with Dr. Arida.   Any Additional Special Instructions Will Be Listed Below (If Applicable).   

## 2015-07-17 NOTE — Progress Notes (Signed)
Primary care physician Dr. 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 He has moderate aortic stenosis as well. He underwent an echocardiogram in March of 2015 which showed worsening of LV systolic function with an ejection fraction of 15%. Shortly after that, the patient started experiencing significant shortness of breath and cough with required 2 hospitalization at [facility]. Right and  left cardiac catheterization in July, 2015 showed only mildly elevated filling pressures with normal cardiac output and no significant pulmonary hypertension. Coronary angiography showed no obstructive coronary artery disease. Ejection fraction was 10-15%.  He continued to have sinus tachycardia and could not advance the dose of carvedilol due to hypotension. This improved significantly with Ivabradine 5 mg twice daily . Most recent echocardiogram in January 2016 showed an ejection fraction of 30-35%. He is doing very well overall and denies any chest pain or significant shortness of breath. No orthopnea, or PND.  He reports shortness of breath only with over exertion.   Allergies  Allergen Reactions  . Lisinopril Hives    Hives   . Metoprolol Nausea And Vomiting    Nausea and vomiting   . Norvasc [Amlodipine Besylate] Other (See Comments)    unknown     Current Outpatient Prescriptions on File Prior to Visit  Medication Sig Dispense Refill  . albuterol (PROAIR HFA) 108 (90 BASE) MCG/ACT inhaler Inhale 2 puffs into the lungs every 6 (six) hours as needed for wheezing or shortness of breath.    Marland Kitchen aspirin 81 MG tablet Take 81 mg by mouth daily.    Marland Kitchen atorvastatin (LIPITOR) 40 MG tablet Take 40 mg by mouth daily.    . carvedilol (COREG) 3.125 MG tablet Take 1 tablet (3.125 mg total) by mouth 2 (two) times daily. 60 tablet 3  .  digoxin (LANOXIN) 0.125 MG tablet Take 1 tablet (0.125 mg total) by mouth daily. 90 tablet 3  . Fluticasone-Salmeterol (ADVAIR) 250-50 MCG/DOSE AEPB Inhale 1 puff into the lungs 2 (two) times daily.    . ivabradine (CORLANOR) 5 MG TABS tablet Take 1 tablet (5 mg total) by mouth 2 (two) times daily with a meal. 180 tablet 3  . losartan (COZAAR) 25 MG tablet Take 1 tablet (25 mg total) by mouth daily. 30 tablet 3  . spironolactone (ALDACTONE) 25 MG tablet Take 1 tablet (25 mg total) by mouth daily. 30 tablet 3  . tamsulosin (FLOMAX) 0.4 MG CAPS capsule Take 0.4 mg by mouth daily.    Marland Kitchen tiotropium (SPIRIVA) 18 MCG inhalation capsule Place 18 mcg into inhaler and inhale daily.    . vitamin C (ASCORBIC ACID) 500 MG tablet Take 500 mg by mouth daily.     No current facility-administered medications on file prior to visit.     Past Medical History  Diagnosis Date  . History of BPH   . COPD (chronic obstructive pulmonary disease) (HCC)   . Hypertension   . Hyperlipidemia   . Chronic back pain   . Pneumonia   . Heart murmur   . Chronic systolic heart failure (HCC)     Ejection fraction of 15%  . Sinus tachycardia (HCC)   . Left bundle branch block   . Kidney stone      Past Surgical History  Procedure Laterality Date  . Cardiac catheterization  2012    [facility]  .  Cardiac catheterization      [facility]  . Left and right heart catheterization with coronary angiogram N/A 03/23/2014    Procedure: LEFT AND RIGHT HEART CATHETERIZATION WITH CORONARY ANGIOGRAM;  Surgeon: [name], MD;  Location: [facility] CATH [facility];  Service: Cardiovascular;  Laterality: N/A;     Family History  Problem Relation Age of Onset  . Heart disease Father   . Heart failure Father   . Hypertension Mother   . Hyperlipidemia Mother      Social History   Social History  . Marital Status: Married    Spouse Name: N/A  . Number of Children: N/A  . Years of Education: N/A   Occupational History  . Not on file.    Social History Main Topics  . Smoking status: Former Smoker -- 0 years    Types: Cigarettes  . Smokeless tobacco: Former Neurosurgeon    Types: Snuff  . Alcohol Use: Yes     Comment: occasional  . Drug Use: No  . Sexual Activity: Not on file   Other Topics Concern  . Not on file   Social History Narrative     ROS A 10 point review of system was performed. It is negative other than that mentioned in the history of present illness.   PHYSICAL EXAM   BP 120/78 mmHg  Pulse 69  Ht  (1.778 m)  Wt 199 lb 12 oz (90.606 kg)  BMI 28.66 kg/m2 Constitutional: He is oriented to person, place, and time. He appears well-developed and well-nourished. No distress.  HENT: No nasal discharge.  Head: Normocephalic and atraumatic.  Eyes: Pupils are equal and round.  No discharge. Neck: Normal range of motion. Neck supple. No JVD present. No thyromegaly present.  Cardiovascular: Normal rate, regular rhythm, normal heart sounds. Exam reveals no gallop and no friction rub. There is a 3/6 systolic ejection murmur in the aortic area Pulmonary/Chest: Effort normal and breath sounds normal. No stridor. No respiratory distress. He has no wheezes. He has no rales. He exhibits no tenderness.  Abdominal: Soft. Bowel sounds are normal. He exhibits no distension. There is no tenderness. There is no rebound and no guarding.  Musculoskeletal: Normal range of motion. He exhibits no edema and no tenderness.  Neurological: He is alert and oriented to person, place, and time. Coordination normal.  Skin: Skin is warm and dry. No rash noted. He is not diaphoretic. No erythema. No pallor.  Psychiatric: He has a normal mood and affect. His behavior is normal. Judgment and thought content normal.     EKG: Normal sinus rhythm with left bundle branch block.     ASSESSMENT AND PLAN

## 2015-07-17 NOTE — Assessment & Plan Note (Signed)
He is doing very well overall and currently in [place] Heart Association class II. He is on optimal medical therapy.  I recommend continuing same medications. We'll plan on checking basic metabolic profile during next visit.

## 2015-07-17 NOTE — Assessment & Plan Note (Signed)
This was moderate by most recent echocardiogram in January. Repeat echocardiogram in 2017.

## 2015-10-03 ENCOUNTER — Other Ambulatory Visit: Payer: Self-pay | Admitting: *Deleted

## 2015-10-03 MED ORDER — LOSARTAN POTASSIUM 25 MG PO TABS: 25.0000 mg | ORAL_TABLET | Freq: Every day | ORAL | Status: DC

## 2015-10-03 NOTE — Telephone Encounter (Signed)
Requested Prescriptions   Signed Prescriptions Disp Refills  . losartan (COZAAR) 25 MG tablet 30 tablet 3    Sig: Take 1 tablet (25 mg total) by mouth daily.    Authorizing Provider: Lorine Bears A    Ordering User: Kendrick Fries

## 2015-10-31 ENCOUNTER — Other Ambulatory Visit: Payer: Self-pay | Admitting: *Deleted

## 2015-10-31 MED ORDER — CARVEDILOL 3.125 MG PO TABS: 3.1250 mg | ORAL_TABLET | Freq: Two times a day (BID) | ORAL | Status: DC

## 2015-10-31 MED ORDER — SPIRONOLACTONE 25 MG PO TABS: 25.0000 mg | ORAL_TABLET | Freq: Every day | ORAL | Status: DC

## 2015-10-31 NOTE — Telephone Encounter (Signed)
Requested Prescriptions   Signed Prescriptions Disp Refills  . carvedilol (COREG) 3.125 MG tablet 60 tablet 3    Sig: Take 1 tablet (3.125 mg total) by mouth 2 (two) times daily.    Authorizing Provider: Lorine Bears A    Ordering User: Kendrick Fries spironolactone (ALDACTONE) 25 MG tablet 30 tablet 3    Sig: Take 1 tablet (25 mg total) by mouth daily.    Authorizing Provider: Lorine Bears A    Ordering User: Kendrick Fries

## 2016-01-15 ENCOUNTER — Encounter: Payer: Self-pay | Admitting: Cardiovascular Disease

## 2016-01-15 ENCOUNTER — Ambulatory Visit (INDEPENDENT_AMBULATORY_CARE_PROVIDER_SITE_OTHER): Payer: Medicare Other | Admitting: Cardiovascular Disease

## 2016-01-15 VITALS — BP 110/80 | HR 63 | Ht 70.0 in | Wt 201.5 lb

## 2016-01-15 DIAGNOSIS — I5022 Chronic systolic (congestive) heart failure: Secondary | ICD-10-CM | POA: Diagnosis not present

## 2016-01-15 DIAGNOSIS — I447 Left bundle-branch block, unspecified: Secondary | ICD-10-CM | POA: Diagnosis not present

## 2016-01-15 NOTE — Progress Notes (Signed)
Cardiology Office Note   Date:  01/15/2016   ID:  Benjamin Frost, DOB [birth year], MRN [id number]  PCP:  Ulanda Edison, MD  Cardiologist:   Lorine Bears, MD   Chief Complaint  Patient presents with  . Other    6 month f/u. Meds reviewed verbally with pt.      History of Present Illness: Benjamin Frost is a 75 y.o. male who presents for a followup visit regarding chronic systolic heart failure with severely reduced LV systolic function and an underlying left bundle branch block. He has moderate aortic stenosis as well. He underwent an echocardiogram in March of 2015 which showed worsening of LV systolic function with an ejection fraction of 15%. Shortly after that, the patient started experiencing significant shortness of breath and cough with required 2 hospitalization at [facility]. Right and  left cardiac catheterization in July, 2015 showed only mildly elevated filling pressures with normal cardiac output and no significant pulmonary hypertension. Coronary angiography showed no obstructive coronary artery disease. Ejection fraction was 10-15%.  He continued to have sinus tachycardia and could not advance the dose of carvedilol due to hypotension. This improved significantly with Ivabradine 5 mg twice daily . Most recent echocardiogram in January 2016 showed an ejection fraction of 30-35%.  He is doing very well overall and denies any chest pain or significant shortness of breath. No orthopnea, or PND.  He reports shortness of breath only with over exertion. No side effects with medications. Corlanor is expensive . Usually he is $100 per month until October when he reaches the "donut hole" and then he started spitting $260 a month.    Past Medical History  Diagnosis Date  . History of BPH   . COPD (chronic obstructive pulmonary disease) (HCC)   . Hypertension   . Hyperlipidemia   . Chronic back pain   . Pneumonia   . Heart murmur   . Chronic systolic heart failure (HCC)    Ejection fraction of 15%  . Sinus tachycardia (HCC)   . Left bundle branch block   . Kidney stone     Past Surgical History  Procedure Laterality Date  . Cardiac catheterization  2012    [facility]  . Cardiac catheterization      [facility]  . Left and right heart catheterization with coronary angiogram N/A 03/23/2014    Procedure: LEFT AND RIGHT HEART CATHETERIZATION WITH CORONARY ANGIOGRAM;  Surgeon: [name], MD;  Location: [facility] CATH [facility];  Service: Cardiovascular;  Laterality: N/A;     Current Outpatient Prescriptions  Medication Sig Dispense Refill  . albuterol (PROAIR HFA) 108 (90 BASE) MCG/ACT inhaler Inhale 2 puffs into the lungs every 6 (six) hours as needed for wheezing or shortness of breath.    Marland Kitchen aspirin 81 MG tablet Take 81 mg by mouth daily.    Marland Kitchen atorvastatin (LIPITOR) 40 MG tablet Take 40 mg by mouth daily.    . carvedilol (COREG) 3.125 MG tablet Take 1 tablet (3.125 mg total) by mouth 2 (two) times daily. 60 tablet 3  . digoxin (LANOXIN) 0.125 MG tablet Take 1 tablet (0.125 mg total) by mouth daily. 90 tablet 3  . Fluticasone-Salmeterol (ADVAIR) 250-50 MCG/DOSE AEPB Inhale 1 puff into the lungs 2 (two) times daily.    . ivabradine (CORLANOR) 5 MG TABS tablet Take 1 tablet (5 mg total) by mouth 2 (two) times daily with a meal. 180 tablet 3  . losartan (COZAAR) 25 MG tablet Take 1 tablet (25 mg total) by  mouth daily. 30 tablet 3  . spironolactone (ALDACTONE) 25 MG tablet Take 1 tablet (25 mg total) by mouth daily. 30 tablet 3  . tamsulosin (FLOMAX) 0.4 MG CAPS capsule Take 0.4 mg by mouth daily.    Marland Kitchen tiotropium (SPIRIVA) 18 MCG inhalation capsule Place 18 mcg into inhaler and inhale daily.    . vitamin C (ASCORBIC ACID) 500 MG tablet Take 500 mg by mouth daily.     No current facility-administered medications for this visit.    Allergies:   Lisinopril; Metoprolol; and Norvasc    Social History:  The patient  reports that he has quit smoking. His smoking use included  Cigarettes. He quit after 0 years of use. He has quit using smokeless tobacco. His smokeless tobacco use included Snuff. He reports that he drinks alcohol. He reports that he does not use illicit drugs.   Family History:  The patient's family history includes Heart disease in his father; Heart failure in his father; Hyperlipidemia in his mother; Hypertension in his mother.    ROS:  Please see the history of present illness.   Otherwise, review of systems are positive for none.   All other systems are reviewed and negative.    PHYSICAL EXAM: VS:  BP 110/80 mmHg  Pulse 63  Ht  (1.778 m)  Wt 201 lb 8 oz (91.4 kg)  BMI 28.91 kg/m2 , BMI Body mass index is 28.91 kg/(m^2). GEN: Well nourished, well developed, in no acute distress HEENT: normal Neck: no JVD, carotid bruits, or masses Cardiac: RRR; no  rubs, or gallops,no edema . There is a 2/6 crescendo decrescendo systolic murmur in the aortic area which is mid peaking Respiratory:  clear to auscultation bilaterally, normal work of breathing GI: soft, nontender, nondistended, + BS MS: no deformity or atrophy Skin: warm and dry, no rash Neuro:  Strength and sensation are intact Psych: euthymic mood, full affect   EKG:  EKG is ordered today. The ekg ordered today demonstrates normal sinus rhythm with left bundle branch block.   Recent Labs: No results found for requested labs within last 365 days.    Lipid Panel    Component Value Date/Time   CHOL 117 02/28/2014 0442   TRIG 65 02/28/2014 0442   HDL 44 02/28/2014 0442   VLDL 13 02/28/2014 0442   LDLCALC 60 02/28/2014 0442      Wt Readings from Last 3 Encounters:  01/15/16 201 lb 8 oz (91.4 kg)  07/17/15 199 lb 12 oz (90.606 kg)  01/17/15 195 lb 8 oz (88.678 kg)       ASSESSMENT AND PLAN:  1.  Chronic systolic heart failure: Due to nonischemic cardiomyopathy. He is overall doing very well and continues to be in [place] Heart Association class II. He is on optimal  medical therapy. I ordered basic metabolic profile and digoxin level. I might consider stopping digoxin in the future.  2. Aortic stenosis: This was mild to moderate on most recent echocardiogram. Recommend repeat echo in late 2017.  3. Hyperlipidemia: Currently on atorvastatin.    Disposition:   FU with me in 6 months  Signed,  Lorine Bears, MD  01/15/2016 10:50 AM    [facility]

## 2016-01-15 NOTE — Patient Instructions (Signed)
Medication Instructions:  Your physician recommends that you continue on your current medications as directed. Please refer to the Current Medication list given to you today.   Labwork: BMET, digoxin today  Testing/Procedures: none  Follow-Up: Your physician wants you to follow-up in: six months with [name].  You will receive a reminder letter in the mail two months in advance. If you don't receive a letter, please call our office to schedule the follow-up appointment.   Any Other Special Instructions Will Be Listed Below (If Applicable).     If you need a refill on your cardiac medications before your next appointment, please call your pharmacy.

## 2016-01-16 ENCOUNTER — Telehealth: Payer: Self-pay | Admitting: Cardiovascular Disease

## 2016-01-16 LAB — BASIC METABOLIC PANEL
BUN/Creatinine Ratio: 18 (ref 10–24)
BUN: 14 mg/dL (ref 8–27)
CALCIUM: 9 mg/dL (ref 8.6–10.2)
CHLORIDE: 99 mmol/L (ref 96–106)
CO2: 24 mmol/L (ref 18–29)
Creatinine, Ser: 0.77 mg/dL (ref 0.76–1.27)
GFR calc Af Amer: 102 mL/min/{1.73_m2} (ref >59)
GFR calc non Af Amer: 88 mL/min/{1.73_m2} (ref >59)
GLUCOSE: 100 mg/dL — AB (ref 65–99)
POTASSIUM: 4.3 mmol/L (ref 3.5–5.2)
SODIUM: 139 mmol/L (ref 134–144)

## 2016-01-16 LAB — DIGOXIN LEVEL: DIGOXIN, SERUM: 0.8 ng/mL (ref 0.5–0.9)

## 2016-01-16 NOTE — Telephone Encounter (Signed)
During 4/24 OV with [name], pt indicated he has $100 monthly copay for Corlanor. Called pt today an advised him to contact [employer] via online or phone to apply for patient co-pay assistance. Provided pt needed information. States he does not have a computer but will ask one of his daughters for help in applying online. Pt is appreciative of the call and will let me know if they are able to assist.

## 2016-01-30 ENCOUNTER — Other Ambulatory Visit: Payer: Self-pay | Admitting: *Deleted

## 2016-01-30 MED ORDER — LOSARTAN POTASSIUM 25 MG PO TABS: 25.0000 mg | ORAL_TABLET | Freq: Every day | ORAL | Status: DC

## 2016-02-22 ENCOUNTER — Other Ambulatory Visit: Payer: Self-pay

## 2016-02-22 MED ORDER — IVABRADINE HCL 5 MG PO TABS: 5.0000 mg | ORAL_TABLET | Freq: Two times a day (BID) | ORAL | Status: DC

## 2016-02-22 NOTE — Telephone Encounter (Signed)
Refill sent for corlanor 5 mg

## 2016-02-29 ENCOUNTER — Other Ambulatory Visit: Payer: Self-pay

## 2016-02-29 MED ORDER — CARVEDILOL 3.125 MG PO TABS: 3.1250 mg | ORAL_TABLET | Freq: Two times a day (BID) | ORAL | Status: DC

## 2016-02-29 MED ORDER — SPIRONOLACTONE 25 MG PO TABS: 25.0000 mg | ORAL_TABLET | Freq: Every day | ORAL | Status: DC

## 2016-02-29 NOTE — Telephone Encounter (Signed)
Refill sent for Spironolactone  

## 2016-02-29 NOTE — Telephone Encounter (Signed)
Refill sent for carvedilol 3.125 mg 

## 2016-03-27 ENCOUNTER — Telehealth: Payer: Self-pay | Admitting: Cardiovascular Disease

## 2016-03-27 NOTE — Telephone Encounter (Signed)
Patient calling the office for samples of medication:   1.  What medication and dosage are you requesting samples for?  Corlanor 5 mg po x 2   2.  Are you currently out of this medication?  Not yet   Please call.

## 2016-04-09 ENCOUNTER — Other Ambulatory Visit: Payer: Self-pay | Admitting: *Deleted

## 2016-04-09 MED ORDER — DIGOXIN 125 MCG PO TABS: 0.1250 mg | ORAL_TABLET | Freq: Every day | ORAL | Status: DC

## 2016-04-09 NOTE — Telephone Encounter (Signed)
Requested Prescriptions   Signed Prescriptions Disp Refills  . digoxin (LANOXIN) 0.125 MG tablet 90 tablet 3    Sig: Take 1 tablet (0.125 mg total) by mouth daily.    Authorizing Provider: Lorine Bears A    Ordering User: Kendrick Fries

## 2016-05-18 IMAGING — CR DG CHEST 2V
1 series · 2 of 2 positions shown · non-contrast
Comparison: Two-view chest 09/17/2012

CLINICAL DATA: Cough and congestion.

EXAM:
CHEST  2 VIEW

[Series 1: w chest pa · 0.14mm/px · 2 of 2 slices shown]
[im 1/2]
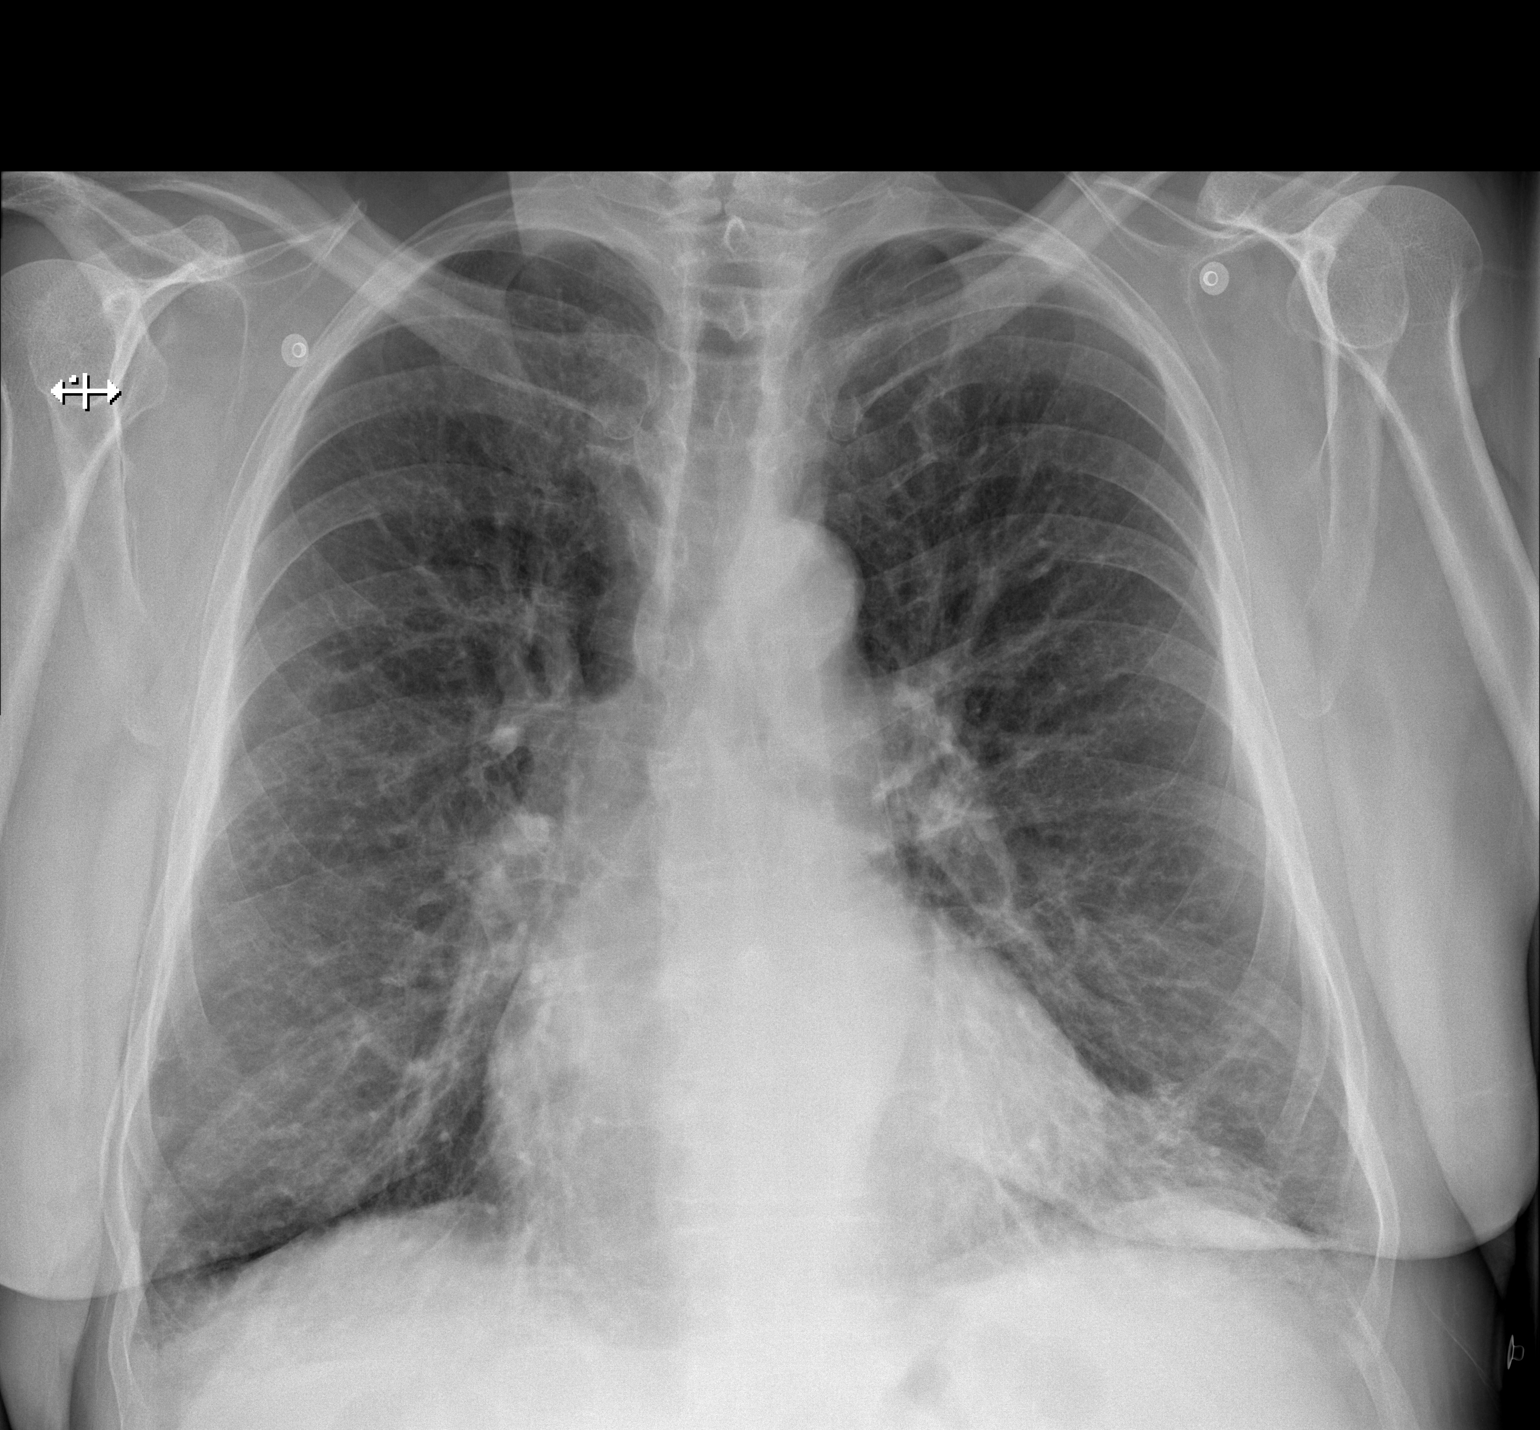
[im 2/2]
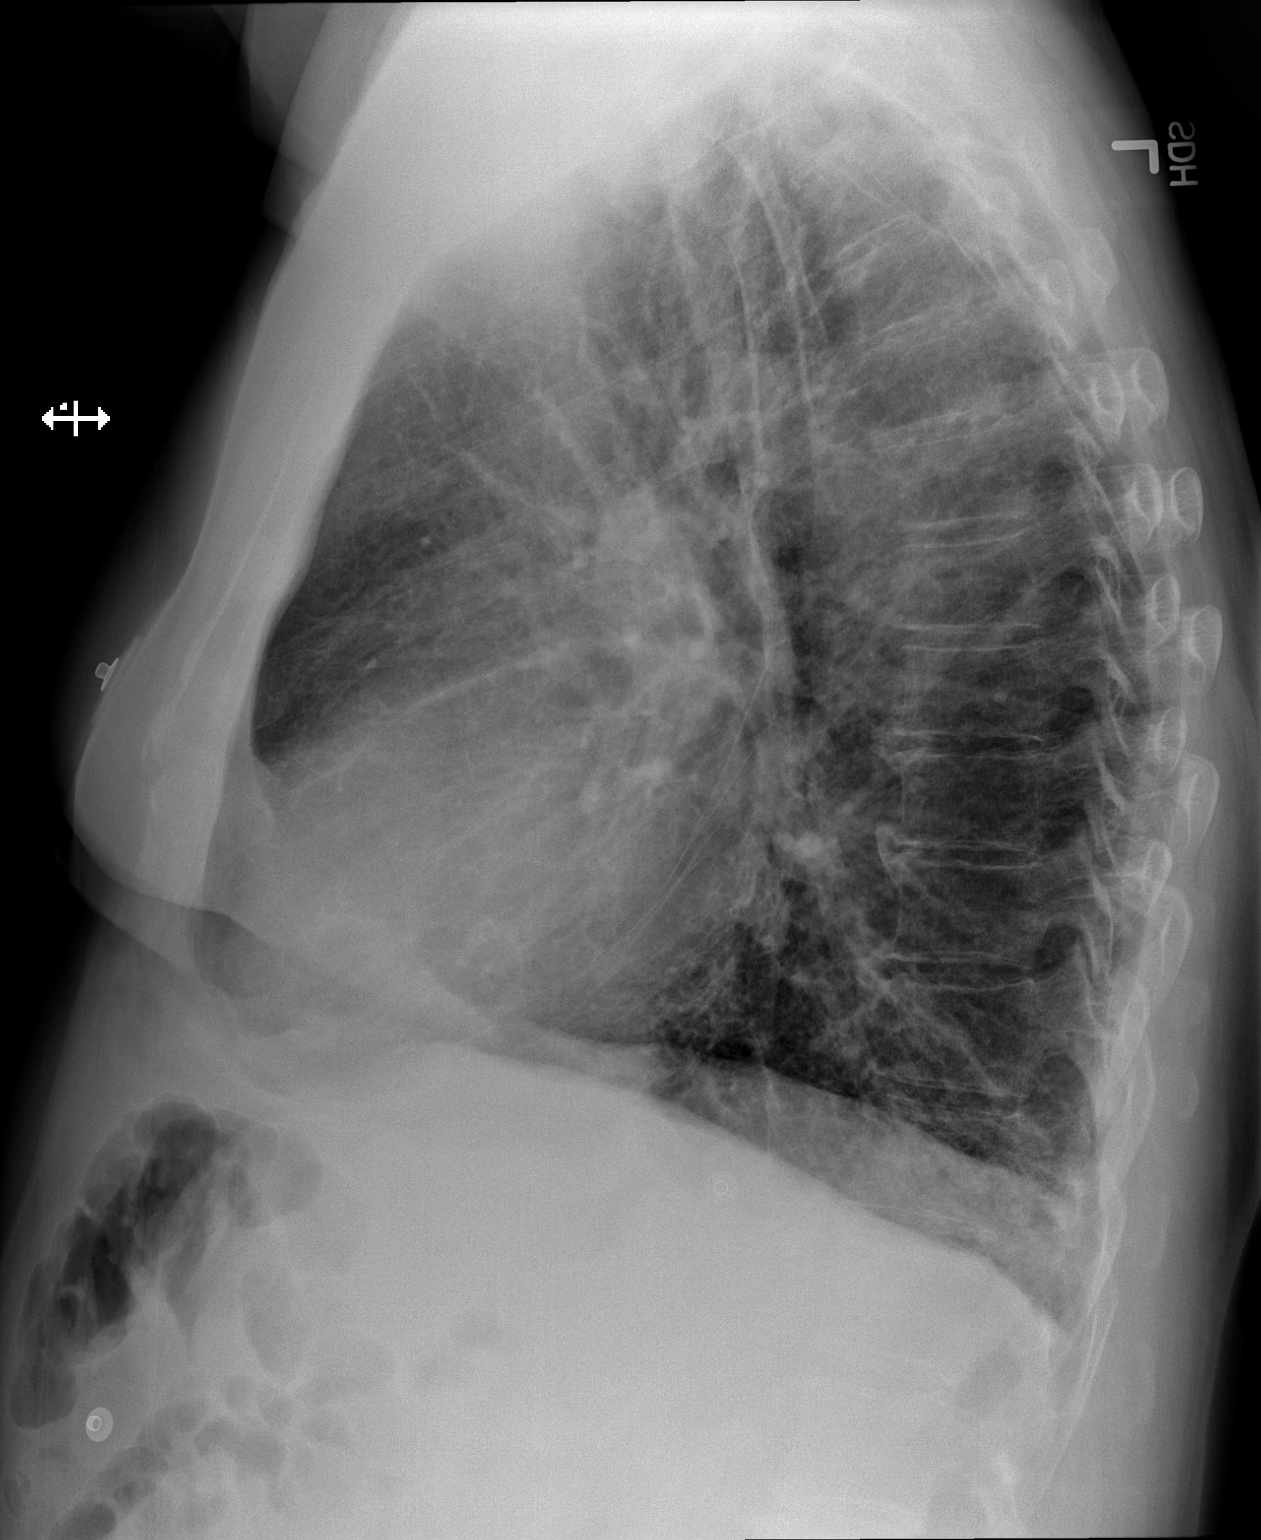

[2 of 2 positions shown; findings below may reference images not displayed]

FINDINGS: Heart size is normal. There is diffuse increased interstitial
prominence compared to prior studies. This likely represents mild
edema superimposed on chronic interstitial change. Mild bibasilar
atelectasis is noted. No other focal airspace consolidation is
evident.
IMPRESSION: 1. Mild increased interstitial prominence is concerning for edema
superimposed on chronic disease.
2. Mild bibasilar airspace opacity, left greater than right. While
this likely represents atelectasis, early infection is not excluded.

## 2016-06-04 ENCOUNTER — Other Ambulatory Visit: Payer: Self-pay | Admitting: *Deleted

## 2016-06-04 MED ORDER — LOSARTAN POTASSIUM 25 MG PO TABS: 25.0000 mg | ORAL_TABLET | Freq: Every day | ORAL | 3 refills | Status: DC

## 2016-07-01 ENCOUNTER — Other Ambulatory Visit: Payer: Self-pay

## 2016-07-01 MED ORDER — CARVEDILOL 3.125 MG PO TABS: 3.1250 mg | ORAL_TABLET | Freq: Two times a day (BID) | ORAL | 3 refills | Status: DC

## 2016-07-01 MED ORDER — SPIRONOLACTONE 25 MG PO TABS: 25.0000 mg | ORAL_TABLET | Freq: Every day | ORAL | 3 refills | Status: DC

## 2016-07-05 ENCOUNTER — Ambulatory Visit (INDEPENDENT_AMBULATORY_CARE_PROVIDER_SITE_OTHER): Payer: Medicare Other | Admitting: Cardiovascular Disease

## 2016-07-05 ENCOUNTER — Encounter: Payer: Self-pay | Admitting: Cardiovascular Disease

## 2016-07-05 VITALS — BP 130/80 | HR 73 | Ht 70.0 in | Wt 199.0 lb

## 2016-07-05 DIAGNOSIS — R000 Tachycardia, unspecified: Secondary | ICD-10-CM | POA: Diagnosis not present

## 2016-07-05 DIAGNOSIS — I5022 Chronic systolic (congestive) heart failure: Secondary | ICD-10-CM

## 2016-07-05 DIAGNOSIS — I447 Left bundle-branch block, unspecified: Secondary | ICD-10-CM | POA: Diagnosis not present

## 2016-07-05 DIAGNOSIS — I350 Nonrheumatic aortic (valve) stenosis: Secondary | ICD-10-CM

## 2016-07-05 NOTE — Patient Instructions (Signed)
Medication Instructions:  Your physician has recommended you make the following change in your medication:  STOP taking digoxin    Labwork: none  Testing/Procedures: Your physician has requested that you have an echocardiogram in Nov. Echocardiography is a painless test that uses sound waves to create images of your heart. It provides your doctor with information about the size and shape of your heart and how well your heart's chambers and valves are working. This procedure takes approximately one hour. There are no restrictions for this procedure.    Follow-Up: Your physician wants you to follow-up in: 6 months with [name].  You will receive a reminder letter in the mail two months in advance. If you don't receive a letter, please call our office to schedule the follow-up appointment.   Any Other Special Instructions Will Be Listed Below (If Applicable).     If you need a refill on your cardiac medications before your next appointment, please call your pharmacy.

## 2016-07-05 NOTE — Progress Notes (Signed)
Cardiology Office Note   Date:  07/05/2016   ID:  Benjamin Frost, DOB July 13, 1941, MRN [id number]  PCP:  Rory Percy, MD  Cardiologist:   Lorine Bears, MD   Chief Complaint  Patient presents with  . Chronic systolic heart failure (HCC)  . LBBB  . Aortic Stenosis  . Sinus Tachycardia      History of Present Illness: Benjamin Frost is a 75 y.o. male who presents for a followup visit regarding chronic systolic heart failure with severely reduced LV systolic function and an underlying left bundle branch block. He has moderate aortic stenosis as well. He underwent an echocardiogram in March of 2015 which showed worsening of LV systolic function with an ejection fraction of 15%. Shortly after that, the patient started experiencing significant shortness of breath and cough with required 2 hospitalization at [facility]. Right and  left cardiac catheterization in July, 2015 showed only mildly elevated filling pressures with normal cardiac output and no significant pulmonary hypertension. Coronary angiography showed no obstructive coronary artery disease. Ejection fraction was 10-15%.  He continued to have sinus tachycardia and could not advance the dose of carvedilol due to hypotension. This improved significantly with Ivabradine 5 mg twice daily . Most recent echocardiogram in January 2016 showed an ejection fraction of 30-35%.  He has been doing well and denies any chest pain, shortness of breath, dizziness or palpitations. No syncope or presyncope. No side effects with medications.    Past Medical History:  Diagnosis Date  . Chronic back pain   . Chronic systolic heart failure (HCC)    Ejection fraction of 15%  . COPD (chronic obstructive pulmonary disease) (HCC)   . Heart murmur   . History of BPH   . Hyperlipidemia   . Hypertension   . Kidney stone   . Left bundle branch block   . Pneumonia   . Sinus tachycardia     Past Surgical History:  Procedure Laterality Date  .  CARDIAC CATHETERIZATION  2012   [facility]  . CARDIAC CATHETERIZATION     [facility]  . LEFT AND RIGHT HEART CATHETERIZATION WITH CORONARY ANGIOGRAM N/A 03/23/2014   Procedure: LEFT AND RIGHT HEART CATHETERIZATION WITH CORONARY ANGIOGRAM;  Surgeon: [name], MD;  Location: [facility] CATH [facility];  Service: Cardiovascular;  Laterality: N/A;     Current Outpatient Prescriptions  Medication Sig Dispense Refill  . albuterol (PROAIR HFA) 108 (90 BASE) MCG/ACT inhaler Inhale 2 puffs into the lungs every 6 (six) hours as needed for wheezing or shortness of breath.    Marland Kitchen aspirin 81 MG tablet Take 81 mg by mouth daily.    Marland Kitchen atorvastatin (LIPITOR) 40 MG tablet Take 40 mg by mouth daily.    . carvedilol (COREG) 3.125 MG tablet Take 1 tablet (3.125 mg total) by mouth 2 (two) times daily. 60 tablet 3  . Fluticasone-Salmeterol (ADVAIR) 250-50 MCG/DOSE AEPB Inhale 1 puff into the lungs 2 (two) times daily.    . ivabradine (CORLANOR) 5 MG TABS tablet Take 1 tablet (5 mg total) by mouth 2 (two) times daily with a meal. 60 tablet 6  . losartan (COZAAR) 25 MG tablet Take 1 tablet (25 mg total) by mouth daily. 30 tablet 3  . spironolactone (ALDACTONE) 25 MG tablet Take 1 tablet (25 mg total) by mouth daily. 30 tablet 3  . tamsulosin (FLOMAX) 0.4 MG CAPS capsule Take 0.4 mg by mouth daily.    Marland Kitchen tiotropium (SPIRIVA) 18 MCG inhalation capsule Place 18 mcg into  inhaler and inhale daily.    . vitamin C (ASCORBIC ACID) 500 MG tablet Take 500 mg by mouth daily.     No current facility-administered medications for this visit.     Allergies:   Amlodipine; Lisinopril; Metoprolol; and Norvasc [amlodipine besylate]    Social History:  The patient  reports that he has quit smoking. His smoking use included Cigarettes. He quit after 0.00 years of use. He has quit using smokeless tobacco. His smokeless tobacco use included Snuff. He reports that he drinks alcohol. He reports that he does not use drugs.   Family History:  The patient's  family history includes Heart disease in his father; Heart failure in his father; Hyperlipidemia in his mother; Hypertension in his mother.    ROS:  Please see the history of present illness.   Otherwise, review of systems are positive for none.   All other systems are reviewed and negative.    PHYSICAL EXAM: VS:  BP 130/80   Pulse 73   Ht 5\' 10"  (1.778 m)   Wt 199 lb (90.3 kg)   SpO2 91%   BMI 28.55 kg/m  , BMI Body mass index is 28.55 kg/m. GEN: Well nourished, well developed, in no acute distress  HEENT: normal  Neck: no JVD, carotid bruits, or masses Cardiac: RRR; no  rubs, or gallops,no edema . There is a 2/6 crescendo decrescendo systolic murmur in the aortic area which is mid peaking Respiratory:  clear to auscultation bilaterally, normal work of breathing GI: soft, nontender, nondistended, + BS MS: no deformity or atrophy  Skin: warm and dry, no rash Neuro:  Strength and sensation are intact Psych: euthymic mood, full affect   EKG:  EKG is ordered today. The ekg ordered today demonstrates normal sinus rhythm with left bundle branch block.   Recent Labs: 01/15/2016: BUN 14; Creatinine, Ser 0.77; Potassium 4.3; Sodium 139    Lipid Panel    Component Value Date/Time   CHOL 117 02/28/2014 0442   TRIG 65 02/28/2014 0442   HDL 44 02/28/2014 0442   VLDL 13 02/28/2014 0442   LDLCALC 60 02/28/2014 0442      Wt Readings from Last 3 Encounters:  07/05/16 199 lb (90.3 kg)  01/15/16 201 lb 8 oz (91.4 kg)  07/17/15 199 lb 12 oz (90.6 kg)       ASSESSMENT AND PLAN:  1.  Chronic systolic heart failure: Due to nonischemic cardiomyopathy. He is overall doing very well and continues to be in [place] Heart [place] class II. He is on optimal medical therapy. I discontinued digoxin today.  2. Aortic stenosis: This was  moderate on most recent echocardiogram.  I requested a follow-up echocardiogram.  3. Hyperlipidemia: Currently on atorvastatin.    Disposition:    FU with me in 6 months  Signed,  Lorine BearsMuhammad Pearlena Ow, MD  07/05/2016 12:28 PM    [facility]

## 2016-07-12 ENCOUNTER — Ambulatory Visit: Payer: Medicare Other | Admitting: Cardiovascular Disease

## 2016-07-30 ENCOUNTER — Telehealth: Payer: Self-pay | Admitting: Cardiovascular Disease

## 2016-07-30 ENCOUNTER — Other Ambulatory Visit: Payer: Self-pay

## 2016-07-30 ENCOUNTER — Ambulatory Visit (INDEPENDENT_AMBULATORY_CARE_PROVIDER_SITE_OTHER): Payer: Medicare Other

## 2016-07-30 DIAGNOSIS — I350 Nonrheumatic aortic (valve) stenosis: Secondary | ICD-10-CM

## 2016-07-30 LAB — ECHOCARDIOGRAM COMPLETE
AV Area VTI index: 0.63 cm2/m2
AV Area mean vel: 1.1 cm2
AV Mean grad: 20 mmHg
AV Peak grad: 36 mmHg
AV pk vel: 298 cm/s
AVAREAMEANVIN: 0.53 cm2/m2
AVAREAVTI: 1.3 cm2
AVCELMEANRAT: 0.39
Ao pk vel: 0.46 m/s
CHL CUP AV PEAK INDEX: 0.63
CHL CUP AV VEL: 1.32
CHL CUP DOP CALC LVOT VTI: 30.7 cm
CHL CUP MV DEC (S): 387
DOP CAL AO MEAN VELOCITY: 211 cm/s
E/e' ratio: 14.91
EWDT: 387 ms
FS: 21 % — AB (ref 28–44)
IVS/LV PW RATIO, ED: 0.86
LA vol A4C: 48.9 ml
LA vol index: 21.9 mL/m2
LA vol: 45.5 mL
LADIAMINDEX: 2.02 cm/m2
LASIZE: 42 mm
LEFT ATRIUM END SYS DIAM: 42 mm
LV E/e'average: 14.91
LV PW d: 8.96 mm — AB (ref 0.6–1.1)
LV SIMPSON'S DISK: 35
LV TDI E'MEDIAL: 3.48
LV dias vol index: 79 mL/m2
LV dias vol: 164 mL — AB (ref 62–150)
LV e' LATERAL: 4.03 cm/s
LV sys vol index: 51 mL/m2
LVEEMED: 14.91
LVOT SV: 87 mL
LVOT area: 2.84 cm2
LVOT peak grad rest: 7 mmHg
LVOTD: 19 mm
LVOTPV: 136 cm/s
LVOTVTI: 0.46 cm
LVSYSVOL: 106 mL — AB (ref 21–61)
MRPISAEROA: 0.07 cm2
MV VTI: 227 cm
MVPKAVEL: 103 m/s
MVPKEVEL: 60.1 m/s
P 1/2 time: 397 ms
Stroke v: 58 ml
TAPSE: 22.4 mm
TDI e' lateral: 4.03
VTI: 66.1 cm
Valve area index: 0.63
Valve area: 1.32 cm2

## 2016-07-30 NOTE — Telephone Encounter (Signed)
Patient needs samples of ivabradine (CORLANOR) 5 MG TABS tablet. Patient here now having a echo and would like to have the samples before he leaves. Thanks

## 2016-07-30 NOTE — Telephone Encounter (Signed)
Samples available of Corlanor 5 mg tablets, gave two bottles. Patient will pick up during his Echo today.

## 2016-10-11 ENCOUNTER — Other Ambulatory Visit: Payer: Self-pay | Admitting: Cardiovascular Disease

## 2016-10-11 MED ORDER — LOSARTAN POTASSIUM 25 MG PO TABS: 25.0000 mg | ORAL_TABLET | Freq: Every day | ORAL | 3 refills | Status: DC

## 2016-11-04 ENCOUNTER — Other Ambulatory Visit: Payer: Self-pay

## 2016-11-04 MED ORDER — SPIRONOLACTONE 25 MG PO TABS: 25.0000 mg | ORAL_TABLET | Freq: Every day | ORAL | 3 refills | Status: DC

## 2016-11-04 MED ORDER — CARVEDILOL 3.125 MG PO TABS: 3.1250 mg | ORAL_TABLET | Freq: Two times a day (BID) | ORAL | 3 refills | Status: DC

## 2016-12-05 ENCOUNTER — Other Ambulatory Visit: Payer: Self-pay

## 2016-12-05 MED ORDER — IVABRADINE HCL 5 MG PO TABS: 5.0000 mg | ORAL_TABLET | Freq: Two times a day (BID) | ORAL | 1 refills | Status: DC

## 2017-01-06 ENCOUNTER — Encounter: Payer: Self-pay | Admitting: Cardiovascular Disease

## 2017-01-06 ENCOUNTER — Ambulatory Visit (INDEPENDENT_AMBULATORY_CARE_PROVIDER_SITE_OTHER): Payer: Medicare Other | Admitting: Cardiovascular Disease

## 2017-01-06 VITALS — BP 100/58 | HR 75 | Ht 70.0 in | Wt 204.5 lb

## 2017-01-06 DIAGNOSIS — E785 Hyperlipidemia, unspecified: Secondary | ICD-10-CM | POA: Diagnosis not present

## 2017-01-06 DIAGNOSIS — I5022 Chronic systolic (congestive) heart failure: Secondary | ICD-10-CM

## 2017-01-06 DIAGNOSIS — R0600 Dyspnea, unspecified: Secondary | ICD-10-CM

## 2017-01-06 DIAGNOSIS — I359 Nonrheumatic aortic valve disorder, unspecified: Secondary | ICD-10-CM | POA: Diagnosis not present

## 2017-01-06 MED ORDER — DIGOXIN 125 MCG PO TABS: 0.1250 mg | ORAL_TABLET | Freq: Every day | ORAL | 3 refills | Status: DC

## 2017-01-06 NOTE — Patient Instructions (Addendum)
Medication Instructions:  Your physician has recommended you make the following change in your medication:  START taking digoxin 0.125mg  once daily   Labwork: CBC, BMET, digoxin level, BNP in one week at [facility] outpatient lab.   Testing/Procedures: none  Follow-Up: Your physician recommends that you schedule a follow-up appointment in: one month with [name].    Any Other Special Instructions Will Be Listed Below (If Applicable).     If you need a refill on your cardiac medications before your next appointment, please call your pharmacy.  Samples of corlanor were given to the patient, quantity 4 bottles, Lot Number [id number]

## 2017-01-06 NOTE — Progress Notes (Signed)
Cardiology Office Note   Date:  01/06/2017   ID:  Benjamin Frost, DOB 09-23-1941, MRN [id number]  PCP:  Rory Percy, MD  Cardiologist:   Lorine Bears, MD   Chief Complaint  Patient presents with  . other    6 month follow up. Patient c/o SOB. Meds reviewed verbally with patient.      History of Present Illness: Benjamin Frost is a 76 y.o. male who presents for a followup visit regarding chronic systolic heart failure with severely reduced LV systolic function and an underlying left bundle branch block. He has moderate aortic stenosis as well. He underwent an echocardiogram in March of 2015 which showed worsening of LV systolic function with an ejection fraction of 15%.  Right and  left cardiac catheterization in July, 2015 showed only mildly elevated filling pressures with normal cardiac output and no significant pulmonary hypertension. Coronary angiography showed no obstructive coronary artery disease. Ejection fraction was 10-15%.  He continued to have sinus tachycardia and could not advance the dose of carvedilol due to hypotension. This improved significantly with Ivabradine 5 mg twice daily . Most recent echocardiogram in November 2017 showed an ejection fraction of 25-30% with at least moderate aortic stenosis with a valve area of 1.28. There was moderate mitral regurgitation. I discontinued digoxin during last visit as he was doing well. Today, he reports progressive shortness of breath which is definitely worse over the last 6 months. He denies any chest discomfort. He cut down on physical activities due to his symptoms. He denies any dyspnea at rest.    Past Medical History:  Diagnosis Date  . Chronic back pain   . Chronic systolic heart failure (HCC)    Ejection fraction of 15%  . COPD (chronic obstructive pulmonary disease) (HCC)   . Heart murmur   . History of BPH   . Hyperlipidemia   . Hypertension   . Kidney stone   . Left bundle branch block   . Pneumonia    . Sinus tachycardia     Past Surgical History:  Procedure Laterality Date  . CARDIAC CATHETERIZATION  2012   [facility]  . CARDIAC CATHETERIZATION     [facility]  . LEFT AND RIGHT HEART CATHETERIZATION WITH CORONARY ANGIOGRAM N/A 03/23/2014   Procedure: LEFT AND RIGHT HEART CATHETERIZATION WITH CORONARY ANGIOGRAM;  Surgeon: [name], MD;  Location: [facility] CATH LAB;  Service: Cardiovascular;  Laterality: N/A;     Current Outpatient Prescriptions  Medication Sig Dispense Refill  . albuterol (PROAIR HFA) 108 (90 BASE) MCG/ACT inhaler Inhale 2 puffs into the lungs every 6 (six) hours as needed for wheezing or shortness of breath.    Marland Kitchen aspirin 81 MG tablet Take 81 mg by mouth daily.    Marland Kitchen atorvastatin (LIPITOR) 40 MG tablet Take 40 mg by mouth daily.    . carvedilol (COREG) 3.125 MG tablet Take 1 tablet (3.125 mg total) by mouth 2 (two) times daily. 60 tablet 3  . Fluticasone-Salmeterol (ADVAIR) 250-50 MCG/DOSE AEPB Inhale 1 puff into the lungs 2 (two) times daily.    . ivabradine (CORLANOR) 5 MG TABS tablet Take 1 tablet (5 mg total) by mouth 2 (two) times daily with a meal. 60 tablet 1  . losartan (COZAAR) 25 MG tablet Take 1 tablet (25 mg total) by mouth daily. 30 tablet 3  . spironolactone (ALDACTONE) 25 MG tablet Take 1 tablet (25 mg total) by mouth daily. 30 tablet 3  . tamsulosin (FLOMAX) 0.4 MG CAPS  capsule Take 0.4 mg by mouth daily.    Marland Kitchen tiotropium (SPIRIVA) 18 MCG inhalation capsule Place 18 mcg into inhaler and inhale daily.    . vitamin C (ASCORBIC ACID) 500 MG tablet Take 500 mg by mouth daily.     No current facility-administered medications for this visit.     Allergies:   Amlodipine; Lisinopril; Metoprolol; and Norvasc [amlodipine besylate]    Social History:  The patient  reports that he has quit smoking. His smoking use included Cigarettes. He quit after 0.00 years of use. He has quit using smokeless tobacco. His smokeless tobacco use included Snuff. He reports that he drinks  alcohol. He reports that he does not use drugs.   Family History:  The patient's family history includes Heart disease in his father; Heart failure in his father; Hyperlipidemia in his mother; Hypertension in his mother.    ROS:  Please see the history of present illness.   Otherwise, review of systems are positive for none.   All other systems are reviewed and negative.    PHYSICAL EXAM: VS:  BP (!) 100/58 (BP Location: Left Arm, Patient Position: Sitting, Cuff Size: Normal)   Pulse 75   Ht 5\' 10"  (1.778 m)   Wt 204 lb 8 oz (92.8 kg)   BMI 29.34 kg/m  , BMI Body mass index is 29.34 kg/m. GEN: Well nourished, well developed, in no acute distress  HEENT: normal  Neck: no JVD, carotid bruits, or masses Cardiac: RRR; no  rubs, or gallops,no edema . There is a 3/6 crescendo decrescendo systolic murmur in the aortic area which is late peaking with almost absent S2 Respiratory:  clear to auscultation bilaterally, normal work of breathing GI: soft, nontender, nondistended, + BS MS: no deformity or atrophy  Skin: warm and dry, no rash Neuro:  Strength and sensation are intact Psych: euthymic mood, full affect   EKG:  EKG is ordered today. The ekg ordered today demonstrates normal sinus rhythm with left bundle branch block and PVCs.   Recent Labs: 01/15/2016: BUN 14; Creatinine, Ser 0.77; Potassium 4.3; Sodium 139    Lipid Panel    Component Value Date/Time   CHOL 117 02/28/2014 0442   TRIG 65 02/28/2014 0442   HDL 44 02/28/2014 0442   VLDL 13 02/28/2014 0442   LDLCALC 60 02/28/2014 0442      Wt Readings from Last 3 Encounters:  01/06/17 204 lb 8 oz (92.8 kg)  07/05/16 199 lb (90.3 kg)  01/15/16 201 lb 8 oz (91.4 kg)       ASSESSMENT AND PLAN:  1.  Chronic systolic heart failure: Due to nonischemic cardiomyopathy. Since his last visit, he has progressed from class II to class III [place] heart Association which is concerning. Most recent ejection fraction was 25-30%.  I'm going to resume digoxin 0.125 mg once daily and will check routine labs in one week. I'm going to reevaluate his symptoms in one month. If there is no improvement, we should consider referral to EP again to consider ICD CRT given his underlying left bundle branch block  2. Aortic stenosis: This was at least moderate on most recent echocardiogram.  I am concerned that his aortic stenosis might be the culprit for his symptoms with low output low gradient stenosis. His physical exam is suggestive of severe stenosis. I would consider dobutamine echocardiogram or invasive evaluation. If we confirmed severe aortic stenosis, he should be a good candidate for TAVR.  3. Hyperlipidemia: Currently on atorvastatin.  Disposition:   FU with me in 1 months  Signed,  Lorine Bears, MD  01/06/2017 2:39 PM    [facility]

## 2017-01-13 ENCOUNTER — Other Ambulatory Visit
Admission: RE | Admit: 2017-01-13 | Discharge: 2017-01-13 | Disposition: A | Discharge status: Home / Self Care | Payer: Medicare Other | Source: Ambulatory Visit | Attending: Cardiovascular Disease | Admitting: Cardiovascular Disease

## 2017-01-13 DIAGNOSIS — I5022 Chronic systolic (congestive) heart failure: Secondary | ICD-10-CM | POA: Diagnosis present

## 2017-01-13 DIAGNOSIS — R0600 Dyspnea, unspecified: Secondary | ICD-10-CM | POA: Insufficient documentation

## 2017-01-13 LAB — CBC
HEMATOCRIT: 42.8 % (ref 40.0–52.0)
HEMOGLOBIN: 14.5 g/dL (ref 13.0–18.0)
MCH: 31.3 pg (ref 26.0–34.0)
MCHC: 33.8 g/dL (ref 32.0–36.0)
MCV: 92.4 fL (ref 80.0–100.0)
Platelets: 136 10*3/uL — ABNORMAL LOW (ref 150–440)
RBC: 4.63 MIL/uL (ref 4.40–5.90)
RDW: 13.7 % (ref 11.5–14.5)
WBC: 5.9 10*3/uL (ref 3.8–10.6)

## 2017-01-13 LAB — BASIC METABOLIC PANEL
ANION GAP: 4 — AB (ref 5–15)
BUN: 11 mg/dL (ref 6–20)
CHLORIDE: 103 mmol/L (ref 101–111)
CO2: 30 mmol/L (ref 22–32)
Calcium: 9.1 mg/dL (ref 8.9–10.3)
Creatinine, Ser: 0.68 mg/dL (ref 0.61–1.24)
GFR calc non Af Amer: >60 mL/min (ref >60)
GLUCOSE: 100 mg/dL — AB (ref 65–99)
POTASSIUM: 4.1 mmol/L (ref 3.5–5.1)
Sodium: 137 mmol/L (ref 135–145)

## 2017-01-13 LAB — BRAIN NATRIURETIC PEPTIDE: B Natriuretic Peptide: 66 pg/mL (ref 0.0–100.0)

## 2017-01-13 LAB — DIGOXIN LEVEL: Digoxin Level: 0.7 ng/mL — ABNORMAL LOW (ref 0.8–2.0)

## 2017-01-31 ENCOUNTER — Other Ambulatory Visit: Payer: Self-pay

## 2017-01-31 MED ORDER — IVABRADINE HCL 5 MG PO TABS: 5.0000 mg | ORAL_TABLET | Freq: Two times a day (BID) | ORAL | 3 refills | Status: DC

## 2017-02-05 ENCOUNTER — Other Ambulatory Visit: Payer: Self-pay

## 2017-02-05 MED ORDER — LOSARTAN POTASSIUM 25 MG PO TABS: 25.0000 mg | ORAL_TABLET | Freq: Every day | ORAL | 3 refills | Status: DC

## 2017-02-10 ENCOUNTER — Ambulatory Visit (INDEPENDENT_AMBULATORY_CARE_PROVIDER_SITE_OTHER): Payer: Medicare Other | Admitting: Cardiovascular Disease

## 2017-02-10 ENCOUNTER — Encounter: Payer: Self-pay | Admitting: Cardiovascular Disease

## 2017-02-10 VITALS — BP 118/70 | HR 76 | Ht 70.0 in | Wt 204.5 lb

## 2017-02-10 DIAGNOSIS — I5022 Chronic systolic (congestive) heart failure: Secondary | ICD-10-CM

## 2017-02-10 DIAGNOSIS — E785 Hyperlipidemia, unspecified: Secondary | ICD-10-CM | POA: Diagnosis not present

## 2017-02-10 DIAGNOSIS — I350 Nonrheumatic aortic (valve) stenosis: Secondary | ICD-10-CM | POA: Diagnosis not present

## 2017-02-10 NOTE — Patient Instructions (Addendum)
Medication Instructions:  Your physician recommends that you continue on your current medications as directed. Please refer to the Current Medication list given to you today.   Labwork: none  Testing/Procedures: Your physician has requested that you have an echocardiogram in 2 months.  Echocardiography is a painless test that uses sound waves to create images of your heart. It provides your doctor with information about the size and shape of your heart and how well your heart's chambers and valves are working. This procedure takes approximately one hour. There are no restrictions for this procedure.    Follow-Up: Your physician recommends that you schedule a follow-up appointment in: 3 months with [name].    Any Other Special Instructions Will Be Listed Below (If Applicable).     If you need a refill on your cardiac medications before your next appointment, please call your pharmacy.  Echocardiogram An echocardiogram, or echocardiography, uses sound waves (ultrasound) to produce an image of your heart. The echocardiogram is simple, painless, obtained within a short period of time, and offers valuable information to your health care provider. The images from an echocardiogram can provide information such as:  Evidence of coronary artery disease (CAD).  Heart size.  Heart muscle function.  Heart valve function.  Aneurysm detection.  Evidence of a past heart attack.  Fluid buildup around the heart.  Heart muscle thickening.  Assess heart valve function. Tell a health care provider about:  Any allergies you have.  All medicines you are taking, including vitamins, herbs, eye drops, creams, and over-the-counter medicines.  Any problems you or family members have had with anesthetic medicines.  Any blood disorders you have.  Any surgeries you have had.  Any medical conditions you have.  Whether you are pregnant or may be pregnant. What happens before the  procedure? No special preparation is needed. Eat and drink normally. What happens during the procedure?  In order to produce an image of your heart, gel will be applied to your chest and a wand-like tool (transducer) will be moved over your chest. The gel will help transmit the sound waves from the transducer. The sound waves will harmlessly bounce off your heart to allow the heart images to be captured in real-time motion. These images will then be recorded.  You may need an IV to receive a medicine that improves the quality of the pictures. What happens after the procedure? You may return to your normal schedule including diet, activities, and medicines, unless your health care provider tells you otherwise. This information is not intended to replace advice given to you by your health care provider. Make sure you discuss any questions you have with your health care provider. Document Released: 09/06/2000 Document Revised: 04/27/2016 Document Reviewed: 05/17/2013 Elsevier Interactive Patient Education  2017 [employer].

## 2017-02-10 NOTE — Progress Notes (Signed)
Cardiology Office Note   Date:  02/10/2017   ID:  Benjamin Frost, DOB [birth year], MRN [id number]  PCP:  Rory Percy, MD  Cardiologist:   Lorine Bears, MD   Chief Complaint  Patient presents with  . OTHER    1 month f/u no complaints today. Meds reviewed verbally with pt.      History of Present Illness: Benjamin Frost is a 76 y.o. male who presents for a followup visit regarding chronic systolic heart failure with severely reduced LV systolic function and an underlying left bundle branch block. He has moderate aortic stenosis as well. He underwent an echocardiogram in March of 2015 which showed worsening of LV systolic function with an ejection fraction of 15%.  Right and  left cardiac catheterization in July, 2015 showed only mildly elevated filling pressures with normal cardiac output and no significant pulmonary hypertension. Coronary angiography showed no obstructive coronary artery disease. Ejection fraction was 10-15%.  He had significant sinus tachycardia that improved significantly with Ivabradine.  Most recent echocardiogram in November 2017 showed an ejection fraction of 25-30% with at least moderate aortic stenosis with a valve area of 1.28. There was moderate mitral regurgitation.  He was seen recently for worsening exertional dyspnea after stopping digoxin. Digoxin was resumed. A follow-up level was 0.7. He reports resolution of his symptoms 1 week after resuming digoxin and he has been feeling back to his baseline with significant improvement in shortness of breath.  Past Medical History:  Diagnosis Date  . Chronic back pain   . Chronic systolic heart failure (HCC)    Ejection fraction of 15%  . COPD (chronic obstructive pulmonary disease) (HCC)   . Heart murmur   . History of BPH   . Hyperlipidemia   . Hypertension   . Kidney stone   . Left bundle branch block   . Pneumonia   . Sinus tachycardia     Past Surgical History:  Procedure Laterality Date    . CARDIAC CATHETERIZATION  2012   [facility]  . CARD[facility]AC CATHETERIZATION     [facility]  . LEFT AND RIGHT HEART CATHETERIZATION WITH CORONARY ANGIOGRAM N/A 03/23/2014   Procedure: LEFT AND RIGHT HEART CATHETERIZATION WITH CORONARY ANGIOGRAM;  Surgeon: [name], MD;  Location: [facility] CATH LAB;  Service: Cardiovascular;  Laterality: N/A;     Current Outpatient Prescriptions  Medication Sig Dispense Refill  . albuterol (PROAIR HFA) 108 (90 BASE) MCG/ACT inhaler Inhale 2 puffs into the lungs every 6 (six) hours as needed for wheezing or shortness of breath.    Marland Kitchen aspirin 81 MG tablet Take 81 mg by mouth daily.    Marland Kitchen atorvastatin (LIPITOR) 40 MG tablet Take 40 mg by mouth daily.    . carvedilol (COREG) 3.125 MG tablet Take 1 tablet (3.125 mg total) by mouth 2 (two) times daily. 60 tablet 3  . digoxin (LANOXIN) 0.125 MG tablet Take 1 tablet (0.125 mg total) by mouth daily. 90 tablet 3  . Fluticasone-Salmeterol (ADVAIR) 250-50 MCG/DOSE AEPB Inhale 1 puff into the lungs 2 (two) times daily.    . ivabradine (CORLANOR) 5 MG TABS tablet Take 1 tablet (5 mg total) by mouth 2 (two) times daily with a meal. 60 tablet 3  . losartan (COZAAR) 25 MG tablet Take 1 tablet (25 mg total) by mouth daily. 30 tablet 3  . spironolactone (ALDACTONE) 25 MG tablet Take 1 tablet (25 mg total) by mouth daily. 30 tablet 3  . tamsulosin (FLOMAX) 0.4 MG CAPS  capsule Take 0.4 mg by mouth daily.    Marland Kitchen tiotropium (SPIRIVA) 18 MCG inhalation capsule Place 18 mcg into inhaler and inhale daily.    . vitamin C (ASCORBIC ACID) 500 MG tablet Take 500 mg by mouth daily.     No current facility-administered medications for this visit.     Allergies:   Amlodipine; Lisinopril; Metoprolol; and Norvasc [amlodipine besylate]    Social History:  The patient  reports that he has quit smoking. His smoking use included Cigarettes. He quit after 0.00 years of use. He has quit using smokeless tobacco. His smokeless tobacco use included Snuff. He reports  that he drinks alcohol. He reports that he does not use drugs.   Family History:  The patient's family history includes Heart disease in his father; Heart failure in his father; Hyperlipidemia in his mother; Hypertension in his mother.    ROS:  Please see the history of present illness.   Otherwise, review of systems are positive for none.   All other systems are reviewed and negative.    PHYSICAL EXAM: VS:  BP 118/70 (BP Location: Left Arm, Patient Position: Sitting, Cuff Size: Normal)   Pulse 76   Ht 5\' 10"  (1.778 m)   Wt 204 lb 8 oz (92.8 kg)   BMI 29.34 kg/m  , BMI Body mass index is 29.34 kg/m. GEN: Well nourished, well developed, in no acute distress  HEENT: normal  Neck: no JVD, carotid bruits, or masses Cardiac: RRR; no  rubs, or gallops,no edema . There is a 3/6 crescendo decrescendo systolic murmur in the aortic area which is late peaking with almost absent S2 Respiratory: Few crackles at the bases , normal work of breathing GI: soft, nontender, nondistended, + BS MS: no deformity or atrophy  Skin: warm and dry, no rash Neuro:  Strength and sensation are intact Psych: euthymic mood, full affect   EKG:  EKG is ordered today. The ekg ordered today demonstrates normal sinus rhythm with left bundle branch block .   Recent Labs: 01/13/2017: B Natriuretic Peptide 66.0; BUN 11; Creatinine, Ser 0.68; Hemoglobin 14.5; Platelets 136; Potassium 4.1; Sodium 137    Lipid Panel    Component Value Date/Time   CHOL 117 02/28/2014 0442   TRIG 65 02/28/2014 0442   HDL 44 02/28/2014 0442   VLDL 13 02/28/2014 0442   LDLCALC 60 02/28/2014 0442      Wt Readings from Last 3 Encounters:  02/10/17 204 lb 8 oz (92.8 kg)  01/06/17 204 lb 8 oz (92.8 kg)  07/05/16 199 lb (90.3 kg)       ASSESSMENT AND PLAN:  1.  Chronic systolic heart failure: Due to nonischemic cardiomyopathy. He is back to [place] heart Association class II after resuming digoxin. He is on optimal medical  therapy. Sherryll Burger is not a good option due to frequent hypotension.  He might be mildly volume overloaded but given the improvement in shortness of breath, I'm going to hold off on starting a loop diuretic. Recent BNP was normal.  2. Aortic stenosis: This was at least moderate on most recent echocardiogram.  His physical exam is suggestive of moderate to severe stenosis. Repeat echocardiogram in 2 months from now. I might consider a dobutamine echo to exclude low-flow no gradient aortic stenosis.  3. Hyperlipidemia: Currently on atorvastatin.   Disposition:   FU with me in 3 months  Signed,  Lorine Bears, MD  02/10/2017 4:18 PM    [facility]

## 2017-03-10 ENCOUNTER — Other Ambulatory Visit: Payer: Self-pay

## 2017-03-10 MED ORDER — SPIRONOLACTONE 25 MG PO TABS: 25.0000 mg | ORAL_TABLET | Freq: Every day | ORAL | 3 refills | Status: DC

## 2017-03-10 NOTE — Telephone Encounter (Signed)
Refill sent for spironolactone  

## 2017-04-14 ENCOUNTER — Ambulatory Visit (INDEPENDENT_AMBULATORY_CARE_PROVIDER_SITE_OTHER): Payer: Medicare Other

## 2017-04-14 ENCOUNTER — Other Ambulatory Visit: Payer: Self-pay

## 2017-04-14 DIAGNOSIS — I350 Nonrheumatic aortic (valve) stenosis: Secondary | ICD-10-CM

## 2017-05-01 ENCOUNTER — Other Ambulatory Visit: Payer: Self-pay

## 2017-05-01 DIAGNOSIS — I350 Nonrheumatic aortic (valve) stenosis: Secondary | ICD-10-CM

## 2017-05-01 NOTE — Progress Notes (Unsigned)
eho

## 2017-05-07 ENCOUNTER — Other Ambulatory Visit (HOSPITAL_COMMUNITY): Payer: Medicare Other

## 2017-05-08 ENCOUNTER — Telehealth (HOSPITAL_COMMUNITY): Payer: Self-pay | Admitting: *Deleted

## 2017-05-08 NOTE — Telephone Encounter (Signed)
Patient given detailed instructions per Stress Test Requisition Sheet for test on 05/15/17 at 7:30.Patient Notified to arrive 30 minutes early, and that it is imperative to arrive on time for appointment to keep from having the test rescheduled.  Patient verbalized understanding. Benjamin Frost

## 2017-05-15 ENCOUNTER — Inpatient Hospital Stay (HOSPITAL_COMMUNITY)
Admission: EM | Admit: 2017-05-15 | Discharge: 2017-05-17 | DRG: 287 | Disposition: A | Discharge status: Home / Self Care | Payer: Medicare Other | Attending: Cardiology | Admitting: Cardiology

## 2017-05-15 ENCOUNTER — Encounter (INDEPENDENT_AMBULATORY_CARE_PROVIDER_SITE_OTHER): Payer: Self-pay

## 2017-05-15 ENCOUNTER — Encounter (HOSPITAL_COMMUNITY): Payer: Self-pay | Admitting: Emergency Medicine

## 2017-05-15 ENCOUNTER — Ambulatory Visit (HOSPITAL_COMMUNITY): Payer: Medicare Other

## 2017-05-15 ENCOUNTER — Other Ambulatory Visit: Payer: Self-pay

## 2017-05-15 ENCOUNTER — Encounter: Payer: Self-pay | Admitting: Internal Medicine

## 2017-05-15 ENCOUNTER — Emergency Department (HOSPITAL_COMMUNITY): Payer: Medicare Other

## 2017-05-15 ENCOUNTER — Ambulatory Visit (HOSPITAL_BASED_OUTPATIENT_CLINIC_OR_DEPARTMENT_OTHER): Payer: Medicare Other

## 2017-05-15 DIAGNOSIS — I350 Nonrheumatic aortic (valve) stenosis: Secondary | ICD-10-CM | POA: Diagnosis present

## 2017-05-15 DIAGNOSIS — Z87442 Personal history of urinary calculi: Secondary | ICD-10-CM | POA: Diagnosis not present

## 2017-05-15 DIAGNOSIS — J449 Chronic obstructive pulmonary disease, unspecified: Secondary | ICD-10-CM | POA: Diagnosis present

## 2017-05-15 DIAGNOSIS — R000 Tachycardia, unspecified: Secondary | ICD-10-CM | POA: Diagnosis present

## 2017-05-15 DIAGNOSIS — I110 Hypertensive heart disease with heart failure: Secondary | ICD-10-CM | POA: Diagnosis present

## 2017-05-15 DIAGNOSIS — N400 Enlarged prostate without lower urinary tract symptoms: Secondary | ICD-10-CM | POA: Diagnosis present

## 2017-05-15 DIAGNOSIS — M549 Dorsalgia, unspecified: Secondary | ICD-10-CM | POA: Diagnosis present

## 2017-05-15 DIAGNOSIS — Z8249 Family history of ischemic heart disease and other diseases of the circulatory system: Secondary | ICD-10-CM | POA: Diagnosis not present

## 2017-05-15 DIAGNOSIS — Z7982 Long term (current) use of aspirin: Secondary | ICD-10-CM

## 2017-05-15 DIAGNOSIS — I272 Pulmonary hypertension, unspecified: Secondary | ICD-10-CM | POA: Diagnosis present

## 2017-05-15 DIAGNOSIS — E785 Hyperlipidemia, unspecified: Secondary | ICD-10-CM | POA: Diagnosis present

## 2017-05-15 DIAGNOSIS — G8929 Other chronic pain: Secondary | ICD-10-CM | POA: Diagnosis present

## 2017-05-15 DIAGNOSIS — Z87891 Personal history of nicotine dependence: Secondary | ICD-10-CM

## 2017-05-15 DIAGNOSIS — I5042 Chronic combined systolic (congestive) and diastolic (congestive) heart failure: Secondary | ICD-10-CM | POA: Diagnosis present

## 2017-05-15 DIAGNOSIS — I4901 Ventricular fibrillation: Principal | ICD-10-CM | POA: Diagnosis present

## 2017-05-15 DIAGNOSIS — R402412 Glasgow coma scale score 13-15, at arrival to emergency department: Secondary | ICD-10-CM | POA: Diagnosis present

## 2017-05-15 DIAGNOSIS — I959 Hypotension, unspecified: Secondary | ICD-10-CM | POA: Diagnosis present

## 2017-05-15 DIAGNOSIS — I429 Cardiomyopathy, unspecified: Secondary | ICD-10-CM | POA: Diagnosis present

## 2017-05-15 DIAGNOSIS — Z79899 Other long term (current) drug therapy: Secondary | ICD-10-CM

## 2017-05-15 DIAGNOSIS — Z7951 Long term (current) use of inhaled steroids: Secondary | ICD-10-CM

## 2017-05-15 DIAGNOSIS — I462 Cardiac arrest due to underlying cardiac condition: Secondary | ICD-10-CM | POA: Diagnosis present

## 2017-05-15 DIAGNOSIS — Z888 Allergy status to other drugs, medicaments and biological substances status: Secondary | ICD-10-CM

## 2017-05-15 DIAGNOSIS — I447 Left bundle-branch block, unspecified: Secondary | ICD-10-CM | POA: Diagnosis present

## 2017-05-15 DIAGNOSIS — I340 Nonrheumatic mitral (valve) insufficiency: Secondary | ICD-10-CM | POA: Diagnosis present

## 2017-05-15 DIAGNOSIS — I5022 Chronic systolic (congestive) heart failure: Secondary | ICD-10-CM

## 2017-05-15 DIAGNOSIS — I10 Essential (primary) hypertension: Secondary | ICD-10-CM | POA: Diagnosis present

## 2017-05-15 DIAGNOSIS — I469 Cardiac arrest, cause unspecified: Secondary | ICD-10-CM | POA: Diagnosis not present

## 2017-05-15 DIAGNOSIS — I428 Other cardiomyopathies: Secondary | ICD-10-CM | POA: Diagnosis not present

## 2017-05-15 HISTORY — DX: Nonrheumatic aortic (valve) stenosis: I35.0

## 2017-05-15 LAB — COMPREHENSIVE METABOLIC PANEL
ALBUMIN: 3.5 g/dL (ref 3.5–5.0)
ALT: 15 U/L — AB (ref 17–63)
AST: 20 U/L (ref 15–41)
Alkaline Phosphatase: 47 U/L (ref 38–126)
Anion gap: 8 (ref 5–15)
BILIRUBIN TOTAL: 1 mg/dL (ref 0.3–1.2)
BUN: 13 mg/dL (ref 6–20)
CALCIUM: 8.4 mg/dL — AB (ref 8.9–10.3)
CO2: 23 mmol/L (ref 22–32)
CREATININE: 1 mg/dL (ref 0.61–1.24)
Chloride: 103 mmol/L (ref 101–111)
GFR calc Af Amer: >60 mL/min (ref >60)
GLUCOSE: 165 mg/dL — AB (ref 65–99)
POTASSIUM: 3.9 mmol/L (ref 3.5–5.1)
Sodium: 134 mmol/L — ABNORMAL LOW (ref 135–145)
TOTAL PROTEIN: 5.7 g/dL — AB (ref 6.5–8.1)

## 2017-05-15 LAB — CBC WITH DIFFERENTIAL/PLATELET
BASOS ABS: 0.1 10*3/uL (ref 0.0–0.1)
BASOS PCT: 1 %
Eosinophils Absolute: 0.2 10*3/uL (ref 0.0–0.7)
Eosinophils Relative: 3 %
HCT: 39.6 % (ref 39.0–52.0)
Hemoglobin: 13.5 g/dL (ref 13.0–17.0)
LYMPHS PCT: 28 %
Lymphs Abs: 2.4 10*3/uL (ref 0.7–4.0)
MCH: 31.8 pg (ref 26.0–34.0)
MCHC: 34.1 g/dL (ref 30.0–36.0)
MCV: 93.4 fL (ref 78.0–100.0)
MONO ABS: 0.5 10*3/uL (ref 0.1–1.0)
Monocytes Relative: 6 %
NEUTROS PCT: 62 %
Neutro Abs: 5.4 10*3/uL (ref 1.7–7.7)
Platelets: 149 10*3/uL — ABNORMAL LOW (ref 150–400)
RBC: 4.24 MIL/uL (ref 4.22–5.81)
RDW: 13.2 % (ref 11.5–15.5)
WBC: 8.6 10*3/uL (ref 4.0–10.5)

## 2017-05-15 LAB — I-STAT CHEM 8, ED
BUN: 14 mg/dL (ref 6–20)
CALCIUM ION: 1.04 mmol/L — AB (ref 1.15–1.40)
CHLORIDE: 101 mmol/L (ref 101–111)
CREATININE: 0.9 mg/dL (ref 0.61–1.24)
Glucose, Bld: 170 mg/dL — ABNORMAL HIGH (ref 65–99)
HEMATOCRIT: 39 % (ref 39.0–52.0)
Hemoglobin: 13.3 g/dL (ref 13.0–17.0)
Potassium: 3.9 mmol/L (ref 3.5–5.1)
Sodium: 139 mmol/L (ref 135–145)
TCO2: 25 mmol/L (ref 0–100)

## 2017-05-15 LAB — CBC
HCT: 39.2 % (ref 39.0–52.0)
HEMOGLOBIN: 13.6 g/dL (ref 13.0–17.0)
MCH: 32.2 pg (ref 26.0–34.0)
MCHC: 34.7 g/dL (ref 30.0–36.0)
MCV: 92.9 fL (ref 78.0–100.0)
Platelets: 123 10*3/uL — ABNORMAL LOW (ref 150–400)
RBC: 4.22 MIL/uL (ref 4.22–5.81)
RDW: 13.2 % (ref 11.5–15.5)
WBC: 8.8 10*3/uL (ref 4.0–10.5)

## 2017-05-15 LAB — I-STAT TROPONIN, ED: Troponin i, poc: 0.01 ng/mL (ref 0.00–0.08)

## 2017-05-15 LAB — I-STAT CG4 LACTIC ACID, ED: LACTIC ACID, VENOUS: 1.98 mmol/L — AB (ref 0.5–1.9)

## 2017-05-15 LAB — CREATININE, SERUM
CREATININE: 0.81 mg/dL (ref 0.61–1.24)
GFR calc Af Amer: >60 mL/min (ref >60)

## 2017-05-15 LAB — DIGOXIN LEVEL: Digoxin Level: 0.5 ng/mL — ABNORMAL LOW (ref 0.8–2.0)

## 2017-05-15 LAB — TROPONIN I: Troponin I: <0.03 ng/mL (ref <0.03)

## 2017-05-15 LAB — MAGNESIUM: MAGNESIUM: 1.9 mg/dL (ref 1.7–2.4)

## 2017-05-15 MED ORDER — ASPIRIN EC 81 MG PO TBEC
81.0000 mg | DELAYED_RELEASE_TABLET | Freq: Every day | ORAL | Status: DC
  Administered 2017-05-16 – 2017-05-17 (×2): 81 mg via ORAL
  Filled 2017-05-15 (×2): qty 1

## 2017-05-15 MED ORDER — CARVEDILOL 3.125 MG PO TABS
3.1250 mg | ORAL_TABLET | Freq: Two times a day (BID) | ORAL | Status: DC
  Administered 2017-05-15 – 2017-05-17 (×4): 3.125 mg via ORAL
  Filled 2017-05-15 (×4): qty 1

## 2017-05-15 MED ORDER — PANTOPRAZOLE SODIUM 40 MG PO TBEC
40.0000 mg | DELAYED_RELEASE_TABLET | Freq: Every day | ORAL | Status: DC
  Administered 2017-05-16 – 2017-05-17 (×2): 40 mg via ORAL
  Filled 2017-05-15 (×2): qty 1

## 2017-05-15 MED ORDER — ASPIRIN 81 MG PO CHEW
324.0000 mg | CHEWABLE_TABLET | ORAL | Status: AC
  Administered 2017-05-15: 324 mg via ORAL
  Filled 2017-05-15: qty 4

## 2017-05-15 MED ORDER — NITROGLYCERIN 0.4 MG SL SUBL: 0.4000 mg | SUBLINGUAL_TABLET | SUBLINGUAL | Status: DC | PRN

## 2017-05-15 MED ORDER — MOMETASONE FURO-FORMOTEROL FUM 200-5 MCG/ACT IN AERO
2.0000 | INHALATION_SPRAY | Freq: Two times a day (BID) | RESPIRATORY_TRACT | Status: DC
  Administered 2017-05-16 (×2): 2 via RESPIRATORY_TRACT
  Filled 2017-05-15 (×2): qty 8.8

## 2017-05-15 MED ORDER — TIOTROPIUM BROMIDE MONOHYDRATE 18 MCG IN CAPS
18.0000 ug | ORAL_CAPSULE | Freq: Every day | RESPIRATORY_TRACT | Status: DC
  Administered 2017-05-16: 18 ug via RESPIRATORY_TRACT
  Filled 2017-05-15: qty 5

## 2017-05-15 MED ORDER — ACETAMINOPHEN 325 MG PO TABS
650.0000 mg | ORAL_TABLET | ORAL | Status: DC | PRN
  Administered 2017-05-15 – 2017-05-17 (×6): 650 mg via ORAL
  Filled 2017-05-15 (×6): qty 2

## 2017-05-15 MED ORDER — AMIODARONE HCL IN DEXTROSE 360-4.14 MG/200ML-% IV SOLN
60.0000 mg/h | INTRAVENOUS | Status: AC
  Administered 2017-05-15: 60 mg/h via INTRAVENOUS
  Filled 2017-05-15: qty 200

## 2017-05-15 MED ORDER — HEPARIN SODIUM (PORCINE) 5000 UNIT/ML IJ SOLN
5000.0000 [IU] | Freq: Three times a day (TID) | INTRAMUSCULAR | Status: DC
  Administered 2017-05-15 – 2017-05-16 (×2): 5000 [IU] via SUBCUTANEOUS
  Filled 2017-05-15 (×2): qty 1

## 2017-05-15 MED ORDER — ASPIRIN 81 MG PO CHEW: 324.0000 mg | CHEWABLE_TABLET | Freq: Once | ORAL | Status: DC

## 2017-05-15 MED ORDER — IVABRADINE HCL 5 MG PO TABS
5.0000 mg | ORAL_TABLET | Freq: Two times a day (BID) | ORAL | Status: DC
  Administered 2017-05-15 – 2017-05-17 (×4): 5 mg via ORAL
  Filled 2017-05-15 (×5): qty 1

## 2017-05-15 MED ORDER — ALBUTEROL SULFATE (2.5 MG/3ML) 0.083% IN NEBU
3.0000 mL | INHALATION_SOLUTION | Freq: Four times a day (QID) | RESPIRATORY_TRACT | Status: DC | PRN
  Administered 2017-05-15 – 2017-05-16 (×2): 3 mL via RESPIRATORY_TRACT
  Filled 2017-05-15 (×2): qty 3

## 2017-05-15 MED ORDER — AMIODARONE HCL IN DEXTROSE 360-4.14 MG/200ML-% IV SOLN
30.0000 mg/h | INTRAVENOUS | Status: DC
  Administered 2017-05-15 – 2017-05-16 (×2): 30 mg/h via INTRAVENOUS
  Filled 2017-05-15 (×2): qty 200

## 2017-05-15 MED ORDER — DIGOXIN 125 MCG PO TABS
0.1250 mg | ORAL_TABLET | Freq: Every day | ORAL | Status: DC
  Administered 2017-05-16 – 2017-05-17 (×2): 0.125 mg via ORAL
  Filled 2017-05-15 (×2): qty 1

## 2017-05-15 MED ORDER — TAMSULOSIN HCL 0.4 MG PO CAPS
0.4000 mg | ORAL_CAPSULE | Freq: Every day | ORAL | Status: DC
  Administered 2017-05-16 – 2017-05-17 (×2): 0.4 mg via ORAL
  Filled 2017-05-15 (×2): qty 1

## 2017-05-15 MED ORDER — ONDANSETRON HCL 4 MG/2ML IJ SOLN: 4.0000 mg | Freq: Four times a day (QID) | INTRAMUSCULAR | Status: DC | PRN

## 2017-05-15 MED ORDER — ASPIRIN 81 MG PO TABS: 81.0000 mg | ORAL_TABLET | Freq: Every day | ORAL | Status: DC

## 2017-05-15 MED ORDER — ASPIRIN 300 MG RE SUPP: 300.0000 mg | RECTAL | Status: AC

## 2017-05-15 MED ORDER — ATORVASTATIN CALCIUM 40 MG PO TABS
40.0000 mg | ORAL_TABLET | Freq: Every day | ORAL | Status: DC
Start: 2017-05-16 — End: 2017-05-17
  Administered 2017-05-16 – 2017-05-17 (×2): 40 mg via ORAL
  Filled 2017-05-15 (×2): qty 1

## 2017-05-15 NOTE — ED Notes (Signed)
Family at bedside. 

## 2017-05-15 NOTE — ED Triage Notes (Addendum)
Pt arrives from stress test where pt lost pulses and was noted to be in vfib arrest, pt was given 1 mg epi, 150 amiodarone, shocked once and had return of pulses after 5 minutes of Cpr. Pt arrives to ED alert and oriented to person and place pt is cool clammy and hypotensive.

## 2017-05-15 NOTE — ED Provider Notes (Signed)
[facility]-EMERGENCY DEPT Provider Note   CSN: [id number] Arrival date & time: 05/15/17  2707     History   Chief Complaint No chief complaint on file.   HPI Benjamin Frost is a 76 y.o. male.  HPI   History limited by acuity of condition, patient amnesia to event  75 year old male with a history of chronic systolic heart failure with an ejection fraction of 15%, COPD, aortic stenosis, hypertension, hyperlipidemia presents with concern of post V. fib arrest after receiving a stress test today. Patient was doing stress test with cardiology, when he developed V. fib arrest. They defibrillated him, gave him 1 mg of epinephrine, and 150 mg of amiodarone and initiated CPR, with return of spontaneous circulation. Patient GCS of 14 and transport with EMS. On arrival to the emergency department, patient cannot remember what occurred. He does not remember if he had chest pain or shortness of breath prior to the event. Does report he has some chest soreness at this time that worsens with palpation. Denies nausea, vomiting. Patient and his wife reported he was in normal state of health prior to his stress test today. Denies fever, prior chest pain, shortness of breath, cough.   Past Medical History:  Diagnosis Date  . Chronic back pain   . Chronic systolic heart failure (HCC)    Ejection fraction of 15%  . COPD (chronic obstructive pulmonary disease) (HCC)   . Heart murmur   . History of BPH   . Hyperlipidemia   . Hypertension   . Kidney stone   . Left bundle branch block   . Pneumonia   . Sinus tachycardia     Patient Active Problem List   Diagnosis Date Noted  . 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 Moderate aortic stenosis 10/21/2014  . Left bundle branch block 03/22/2014  . Chronic systolic heart failure (HCC)   . Sinus tachycardia     Past Surgical History:  Procedure Laterality Date  . CARDIAC CATHETERIZATION  2012   [facility]  . CARDIAC CATHETERIZATION     [facility]  . LEFT AND  RIGHT HEART CATHETERIZATION WITH CORONARY ANGIOGRAM N/A 03/23/2014   Procedure: LEFT AND RIGHT HEART CATHETERIZATION WITH CORONARY ANGIOGRAM;  Surgeon: [name], MD;  Location: [facility] CATH LAB;  Service: Cardiovascular;  Laterality: N/A;       Home Medications    Prior to Admission medications   Medication Sig Start Date End Date Taking? Authorizing Provider  albuterol (PROAIR HFA) 108 (90 BASE) MCG/ACT inhaler Inhale 2 puffs into the lungs every 6 (six) hours as needed for wheezing or shortness of breath.   Yes [provider]  aspirin 81 MG tablet Take 81 mg by mouth daily.   Yes [provider]  atorvastatin (LIPITOR) 40 MG tablet Take 40 mg by mouth daily.   Yes [provider]  carvedilol (COREG) 3.125 MG tablet Take 1 tablet (3.125 mg total) by mouth 2 (two) times daily. 11/04/16  Yes [name], MD  digoxin (LANOXIN) 0.125 MG tablet Take 1 tablet (0.125 mg total) by mouth daily. 01/06/17  Yes [name], MD  Fluticasone-Salmeterol (ADVAIR) 250-50 MCG/DOSE AEPB Inhale 1 puff into the lungs 2 (two) times daily.   Yes [provider]  ivabradine (CORLANOR) 5 MG TABS tablet Take 1 tablet (5 mg total) by mouth 2 (two) times daily with a meal. 01/31/17  Yes [name], MD  losartan (COZAAR) 25 MG tablet Take 1 tablet (25 mg total) by mouth  daily. 02/05/17  Yes [name], MD  omeprazole (PRILOSEC) 20 MG capsule Take 20 mg by mouth daily. 04/15/17  Yes [provider]  spironolactone (ALDACTONE) 25 MG tablet Take 1 tablet (25 mg total) by mouth daily. 03/10/17  Yes [name], MD  tamsulosin (FLOMAX) 0.4 MG CAPS capsule Take 0.4 mg by mouth daily.   Yes [provider]  tiotropium (SPIRIVA) 18 MCG inhalation capsule Place 18 mcg into inhaler and inhale daily.   Yes [provider]  vitamin C (ASCORBIC ACID) 500 MG tablet Take 500 mg by mouth daily.   Yes [provider]    Family  History Family History  Problem Relation Age of Onset  . Heart disease Father   . Heart failure Father   . Hypertension Mother   . Hyperlipidemia Mother     Social History Social History  Substance Use Topics  . Smoking status: Former Smoker    Years: 0.00    Types: Cigarettes  . Smokeless tobacco: Former Neurosurgeon    Types: Snuff  . Alcohol use Yes     Comment: occasional     Allergies   Amlodipine; Lisinopril; Metoprolol; and Norvasc [amlodipine besylate]   Review of Systems Review of Systems  Unable to perform ROS: Acuity of condition  Constitutional: Negative for fever.  Respiratory: Negative for cough and shortness of breath.   Cardiovascular: Positive for chest pain.  Gastrointestinal: Negative for abdominal pain, nausea and vomiting.  Genitourinary: Negative for difficulty urinating.  Skin: Negative for rash.  Neurological: Positive for syncope. Negative for headaches.     Physical Exam Updated Vital Signs BP 104/64   Pulse 65   Temp (!) 97.1 F (36.2 C) (Oral)   Resp 17   SpO2 100%   Physical Exam  Constitutional: He is oriented to person, place, and time. He appears well-developed and well-nourished. No distress.  HENT:  Head: Normocephalic and atraumatic.  Eyes: Conjunctivae and EOM are normal.  Neck: Normal range of motion.  Cardiovascular: Normal rate, regular rhythm, normal heart sounds and intact distal pulses.  Exam reveals no gallop and no friction rub.   No murmur heard. Pulmonary/Chest: Effort normal and breath sounds normal. No respiratory distress. He has no wheezes. He has no rales. He exhibits tenderness.  Abdominal: Soft. He exhibits no distension. There is no tenderness. There is no guarding.  Musculoskeletal: He exhibits no edema.  Neurological: He is alert and oriented to person, place, and time.  Skin: Skin is warm and dry. He is not diaphoretic.  Nursing note and vitals reviewed.    ED Treatments / Results  Labs (all labs  ordered are listed, but only abnormal results are displayed) Labs Reviewed  CBC WITH DIFFERENTIAL/PLATELET - Abnormal; Notable for the following:       Result Value   Platelets 149 (*)    All other components within normal limits  COMPREHENSIVE METABOLIC PANEL - Abnormal; Notable for the following:    Sodium 134 (*)    Glucose, Bld 165 (*)    Calcium 8.4 (*)    Total Protein 5.7 (*)    ALT 15 (*)    All other components within normal limits  I-STAT CHEM 8, ED - Abnormal; Notable for the following:    Glucose, Bld 170 (*)    Calcium, Ion 1.04 (*)    All other components within normal limits  I-STAT CG4 LACTIC ACID, ED - Abnormal; Notable for the following:    Lactic Acid,  Venous 1.98 (*)    All other components within normal limits  MAGNESIUM  TROPONIN I  DIGOXIN LEVEL  CBC  CREATININE, SERUM  I-STAT TROPONIN, ED  I-STAT CG4 LACTIC ACID, ED    EKG  EKG Interpretation  Date/Time:  Thursday May 15 2017 08:27:36 EDT Ventricular Rate:  80 PR Interval:    QRS Duration: 161 QT Interval:  438 QTC Calculation: 506 R Axis:   48 Text Interpretation:  Sinus rhythm Ventricular premature complex IVCD, consider atypical LBBB Borderline aVL, appears isoelectric No significant change since last tracing Confirmed by Alvira Monday ([id number]) on 05/15/2017 8:35:58 AM       Radiology Dg Chest Portable 1 View  Result Date: 05/15/2017 CLINICAL DATA:  Status post syncopal episode during trend nail. EXAM: PORTABLE CHEST 1 VIEW COMPARISON:  CT scan chest of March 16, 2014 and chest x-ray of March 12, 2014 FINDINGS: The lungs are well-expanded. There is no focal infiltrate. There is no pleural effusion. The heart is top-normal in size. The central pulmonary vascularity is prominent. There is no significant pulmonary edema. There is calcification in the wall of the aortic arch. Ex the sternal pacemaker defibrillator pads are present. The bony thorax is unremarkable. IMPRESSION: There is no acute  cardiopulmonary abnormality. Mild cardiomegaly, stable. Mild Interstitial prominence consistent with the patient's smoking history. Electronically Signed   By: David  [place] M.D.   On: 05/15/2017 08:46    Procedures Procedures (including critical care time)  Medications Ordered in ED Medications  amiodarone (NEXTERONE PREMIX) 360-4.14 MG/200ML-% (1.8 mg/mL) IV infusion (0 mg/hr Intravenous Stopped 05/15/17 1435)  amiodarone (NEXTERONE PREMIX) 360-4.14 MG/200ML-% (1.8 mg/mL) IV infusion (30 mg/hr Intravenous Transfusing/Transfer 05/15/17 1836)  aspirin EC tablet 81 mg (not administered)  nitroGLYCERIN (NITROSTAT) SL tablet 0.4 mg (not administered)  acetaminophen (TYLENOL) tablet 650 mg (not administered)  ondansetron (ZOFRAN) injection 4 mg (not administered)  heparin injection 5,000 Units (not administered)  albuterol (PROVENTIL) (2.5 MG/3ML) 0.083% nebulizer solution 3 mL (not administered)  atorvastatin (LIPITOR) tablet 40 mg (not administered)  digoxin (LANOXIN) tablet 0.125 mg (not administered)  carvedilol (COREG) tablet 3.125 mg (3.125 mg Oral Not Given 05/15/17 1031)  mometasone-formoterol (DULERA) 200-5 MCG/ACT inhaler 2 puff (2 puffs Inhalation Not Given 05/15/17 0930)  ivabradine (CORLANOR) tablet 5 mg (not administered)  pantoprazole (PROTONIX) EC tablet 40 mg (not administered)  tamsulosin (FLOMAX) capsule 0.4 mg (not administered)  tiotropium (SPIRIVA) inhalation capsule 18 mcg (18 mcg Inhalation Not Given 05/15/17 0930)  aspirin chewable tablet 324 mg (324 mg Oral Given 05/15/17 0932)    Or  aspirin suppository 300 mg ( Rectal See Alternative 05/15/17 0932)   CRITICAL CARE: post arrest, amiodarone infusion Performed by: Rhae Lerner   Total critical care time: 30 minutes  Critical care time was exclusive of separately billable procedures and treating other patients.  Critical care was necessary to treat or prevent imminent or life-threatening  deterioration.  Critical care was time spent personally by me on the following activities: development of treatment plan with patient and/or surrogate as well as nursing, discussions with consultants, evaluation of patient's response to treatment, examination of patient, obtaining history from patient or surrogate, ordering and performing treatments and interventions, ordering and review of laboratory studies, ordering and review of radiographic studies, pulse oximetry and re-evaluation of patient's condition.   Initial Impression / Assessment and Plan / ED Course  I have reviewed the triage vital signs and the nursing notes.  Pertinent labs & imaging results that were  available during my care of the patient were reviewed by me and considered in my medical decision making (see chart for details).     76 year old male with a history of chronic systolic heart failure with an ejection fraction of 15%, COPD, aortic stenosis, hypertension, hyperlipidemia presents with concern of post V. fib arrest while receiving a stress test today. defibrillated him, gave him 1 mg of epinephrine, and 150 mg of amiodarone and initiated CPR, with return of spontaneous circulation. Patient GCS of 14 on transport with EMS, GCS 15 in the ED.  CXR shows no sign of pneumothorax, discussed that occult rib fx may be possible given CPR, but rib contusions likely.  Troponin negative. EKG shows LBBB, unchanged.  Labs show no acute abnormalities.  Amiodarone infusion initiated. Consulted Cardiology on arrival given patient with VFib arrest, question need for emergent catheterization. Dr. Duke Salvia came emergently to bedside and evaluated the patient. Will admit for continued care.   Final Clinical Impressions(s) / ED Diagnoses   Final diagnoses:  Cardiac arrest with ventricular fibrillation [facility])    New Prescriptions Current Discharge Medication List       Alvira Monday, MD 05/15/17 1902

## 2017-05-15 NOTE — H&P (Addendum)
Patient ID: Benjamin Frost MRN: [id number] DOB/AGE: May 13, 1941 75 y.o.  Admit date: 05/15/2017 Primary Physician   Ruhl, Evern Bio, MD Primary Cardiologist   Lorine Bears, MD Chief Complaint    Ventricular fibrillation   HPI: Mr. Benjamin Frost is a 89M with chronic systolic and diastolic heart failure (LVEF 25-30%), moderate aortic stenosis, LBBB, and COPD who presented to clinic today for a dobutamine stress test. With infusion 20 g of dobutamine he became unresponsive and went into ventricular fibrillation. CPR was initiated and he received epinephrine 1 mg. He was successfully defibrillated with one shock.   CPR time was reportedly 2-3 minutes.  His post-conversion rhythm was sinus with PVCs. He was started on IV amiodarone.  He regained consciousness and was able to follow commands. Prior to this episode he had been feeling well and was in his usual state of health. He denied any chest pain, shortness of breath, palpitations or lightheadedness, dizziness, lower extremity edema, orthopnea, or PND.  He has a known, non-ischemic cardiomyopathy.  Echo 04/14/17 showed LVEF 25-30% and diffuse hypokinesis.  His aortic valve was at least moderately stenosed and was thought to be underestimated due to systolic dysfunction. The mean gradient was 20 mmHg. He was referred for dobutamine stress echo to assess for low-gradient AS.  Mr. Benjamin Frost recently underwent left heart catheterization 03/2014 that showed mildly elevated filling pressures, normal cardiac output and no significant pulmonary hypertension. Coronary angiography did not reveal any objective coronary artery disease. LVEF was 10-15% at that time.   Review of Systems:  A 12 point review of systems was obtained and was negative with pertinent positives noted in the HPI.  Past Medical History:  Diagnosis Date  . Chronic back pain   . Chronic systolic heart failure (HCC)    Ejection fraction of 15%  . COPD (chronic obstructive pulmonary disease)  (HCC)   . Heart murmur   . History of BPH   . Hyperlipidemia   . Hypertension   . Kidney stone   . Left bundle branch block   . Pneumonia   . Sinus tachycardia      (Not in a hospital admission)   Allergies  Allergen Reactions  . Amlodipine Other (See Comments)    unknown  . Lisinopril Hives    Hives   . Metoprolol Nausea And Vomiting    Nausea and vomiting   . Norvasc [Amlodipine Besylate] Other (See Comments)    unknown    Social History   Social History  . Marital status: Married    Spouse name: N/A  . Number of children: N/A  . Years of education: N/A   Occupational History  . Not on file.   Social History Main Topics  . Smoking status: Former Smoker    Years: 0.00    Types: Cigarettes  . Smokeless tobacco: Former Neurosurgeon    Types: Snuff  . Alcohol use Yes     Comment: occasional  . Drug use: No  . Sexual activity: Not on file   Other Topics Concern  . Not on file   Social History Narrative  . No narrative on file    Family History  Problem Relation Age of Onset  . Heart disease Father   . Heart failure Father   . Hypertension Mother   . Hyperlipidemia Mother     PHYSICAL EXAM: Vitals:   05/15/17 0830 05/15/17 0832  BP: 95/67 94/68  Pulse: 75 77  Resp: 10 13  Temp:  SpO2: 100% 100%   General:  Well appearing. No respiratory difficulty HEENT: normal Neck: supple. no JVD. Carotids 2+ bilat; no bruits. No lymphadenopathy or thryomegaly appreciated. Cor: PMI nondisplaced. Regular rate & rhythm. No rubs, gallops or murmurs. Lungs: clear To auscultation bilaterally. No crackles, wheezes, or rhonchi. Chest: Chest wall tenderness to palpation.  Abdomen: soft, nontender, nondistended. No hepatosplenomegaly. No bruits or masses. Good bowel sounds. Extremities: no cyanosis, clubbing, rash, edema Neuro: alert & oriented x 3, cranial nerves grossly intact. moves all 4 extremities w/o difficulty. Affect pleasant.   Results for orders placed  or performed during the hospital encounter of 05/15/17 (from the past 24 hour(s))  CBC with Differential     Status: Abnormal   Collection Time: 05/15/17  8:25 AM  Result Value Ref Range   WBC 8.6 4.0 - 10.5 K/uL   RBC 4.24 4.22 - 5.81 MIL/uL   Hemoglobin 13.5 13.0 - 17.0 g/dL   HCT 41.3 24.4 - 01.0 %   MCV 93.4 78.0 - 100.0 fL   MCH 31.8 26.0 - 34.0 pg   MCHC 34.1 30.0 - 36.0 g/dL   RDW 27.2 53.6 - 64.4 %   Platelets 149 (L) 150 - 400 K/uL   Neutrophils Relative % 62 %   Neutro Abs 5.4 1.7 - 7.7 K/uL   Lymphocytes Relative 28 %   Lymphs Abs 2.4 0.7 - 4.0 K/uL   Monocytes Relative 6 %   Monocytes Absolute 0.5 0.1 - 1.0 K/uL   Eosinophils Relative 3 %   Eosinophils Absolute 0.2 0.0 - 0.7 K/uL   Basophils Relative 1 %   Basophils Absolute 0.1 0.0 - 0.1 K/uL  I-Stat Chem 8, ED     Status: Abnormal   Collection Time: 05/15/17  8:47 AM  Result Value Ref Range   Sodium 139 135 - 145 mmol/L   Potassium 3.9 3.5 - 5.1 mmol/L   Chloride 101 101 - 111 mmol/L   BUN 14 6 - 20 mg/dL   Creatinine, Ser 0.34 0.61 - 1.24 mg/dL   Glucose, Bld 742 (H) 65 - 99 mg/dL   Calcium, Ion 5.95 (L) 1.15 - 1.40 mmol/L   TCO2 25 0 - 100 mmol/L   Hemoglobin 13.3 13.0 - 17.0 g/dL   HCT 63.8 75.6 - 43.3 %  I-Stat CG4 Lactic Acid, ED     Status: Abnormal   Collection Time: 05/15/17  8:48 AM  Result Value Ref Range   Lactic Acid, Venous 1.98 (H) 0.5 - 1.9 mmol/L   Dg Chest Portable 1 View  Result Date: 05/15/2017 CLINICAL DATA:  Status post syncopal episode during trend nail. EXAM: PORTABLE CHEST 1 VIEW COMPARISON:  CT scan chest of March 16, 2014 and chest x-ray of March 12, 2014 FINDINGS: The lungs are well-expanded. There is no focal infiltrate. There is no pleural effusion. The heart is top-normal in size. The central pulmonary vascularity is prominent. There is no significant pulmonary edema. There is calcification in the wall of the aortic arch. Ex the sternal pacemaker defibrillator pads are present. The  bony thorax is unremarkable. IMPRESSION: There is no acute cardiopulmonary abnormality. Mild cardiomegaly, stable. Mild Interstitial prominence consistent with the patient's smoking history. Electronically Signed   By: Benjamin  Frost M.D.   On: 05/15/2017 08:46     ECG: Sinus rhythm. Rate 73 bpm. First degree AV block. Left bundle branch block.   Echo 04/14/17: Study Conclusions  - Left ventricle: The cavity size was mildly dilated. Systolic  function was severely reduced. The estimated ejection fraction   was in the range of 25% to 30%. Diffuse hypokinesis. Regional   wall motion abnormalities cannot be excluded. Doppler parameters   are consistent with abnormal left ventricular relaxation (grade 1   diastolic dysfunction). - Aortic valve: Poorly visualized. There was at least moderate   stenosis. Degree of stenosis likely underestimated secondary to   depressed EF. Peak velocity (S): 303 cm/s. Mean gradient (S): 20   mm Hg. Valve area (VTI): 0.84 cm^2. Valve area (Vmean): 0.79   cm^2. - Mitral valve: There was mild regurgitation. - Right ventricle: Systolic function was normal. - Pulmonary arteries: Systolic pressure was within the normal   range.   ASSESSMENT/PLAN:   # Ventricular fibrillation:  Mr. Pavek had an episode of ventricular fibrillation in the setting of dobutamine infusion. This was the likely cause. He did not have any symptoms of chest pain or shortness of breath prior to this episode.  Left heart cath in 2015 revealed nonobstructive disease. Although he did have a dobutamine echo today, the sensitivity of this for assessing ischemia is poor in underlying systolic dysfunction. We will await the results of his dobutamine stress echo.  If it definitively shows severe AS, consider coronary CT-A tomorrow to ensure that he does not have any new obstructive disease.  If the stress echo was non-conclusive, likely LHC for coronary assessment and hemodynamic assessment of the  aortic valve.  Continue amiodarone for now.  # Moderate aortic stenosis:  Dobutamine stress echo results are not yet available.   # Chronic systolic and diastolic heart failure:  Mr. Lopezmartinez is euvolemic and was doing well.  Continue carvedilol.  We will hold losartan for now given that his blood pressure is a little low. Continue ivabradine.  Given his LBBB, could consider upgrade to Bi-V ICD.   # Hyperlipidemia:  Continue rosuvastatin   Signed: Sarath Privott C. Duke Salvia, MD, [facility]  05/15/2017, 8:54 AM

## 2017-05-15 NOTE — Progress Notes (Signed)
Cardiology Event Note  Date: 05/15/17 Time: 8:19 AM  Mr. Benjamin Frost is 76 year old male with history of nonischemic cardiomyopathy with LVEF of approximately 25%, at least moderate aortic stenosis, and COPD, referred for dobutamine stress echo for evaluation of possible severe low-flow low gradient aortic stenosis. During dobutamine infusion, the patient became unresponsive with ventricular fibrillation. CODE BLUE was called and CPR immediately started. Mr. Benjamin Frost received 1 mg of epinephrine and was successfully defibrillated x1. Total CPR time was approximately 2-3 minutes. Post defibrillation rhythm strips showed sinus rhythm with frequent PVCs and NSVT. Amiodarone 150 mg IV x 1 was administered. Mr. Benjamin Frost regained consciousness and was able to follow commands, though he remained somewhat disoriented. Postarrest blood pressure was approximately 100-110/50-60. EKG demonstrated sinus rhythm with left bundle branch block (old). Patient is currently being transported to [facility] ED by EMS. Dr. Duke Salvia (DOD at [facility]) has been updated regarding the patient's events.  [name], MD [facility] Pager: [phone]

## 2017-05-16 ENCOUNTER — Encounter (HOSPITAL_COMMUNITY)
Admission: EM | Disposition: A | Discharge status: Home / Self Care | Payer: Self-pay | Source: Home / Self Care | Attending: Cardiovascular Disease

## 2017-05-16 ENCOUNTER — Encounter (HOSPITAL_COMMUNITY): Payer: Self-pay | Admitting: Family

## 2017-05-16 DIAGNOSIS — I5042 Chronic combined systolic (congestive) and diastolic (congestive) heart failure: Secondary | ICD-10-CM

## 2017-05-16 DIAGNOSIS — I469 Cardiac arrest, cause unspecified: Secondary | ICD-10-CM

## 2017-05-16 HISTORY — PX: RIGHT/LEFT HEART CATH AND CORONARY ANGIOGRAPHY: CATH118266

## 2017-05-16 LAB — POCT I-STAT 3, ART BLOOD GAS (G3+)
Bicarbonate: 25.2 mmol/L (ref 20.0–28.0)
O2 Saturation: 95 %
PH ART: 7.402 (ref 7.350–7.450)
TCO2: 26 mmol/L (ref 22–32)
pCO2 arterial: 40.4 mmHg (ref 32.0–48.0)
pO2, Arterial: 76 mmHg — ABNORMAL LOW (ref 83.0–108.0)

## 2017-05-16 LAB — POCT I-STAT 3, VENOUS BLOOD GAS (G3P V)
Acid-Base Excess: 1 mmol/L (ref 0.0–2.0)
Bicarbonate: 26.3 mmol/L (ref 20.0–28.0)
O2 Saturation: 67 %
PCO2 VEN: 43.6 mmHg — AB (ref 44.0–60.0)
PO2 VEN: 36 mmHg (ref 32.0–45.0)
TCO2: 28 mmol/L (ref 22–32)
pH, Ven: 7.388 (ref 7.250–7.430)

## 2017-05-16 SURGERY — RIGHT/LEFT HEART CATH AND CORONARY ANGIOGRAPHY
Anesthesia: LOCAL

## 2017-05-16 MED ORDER — LIDOCAINE HCL (PF) 1 % IJ SOLN
INTRAMUSCULAR | Status: DC | PRN
  Administered 2017-05-16: 10 mL

## 2017-05-16 MED ORDER — MIDAZOLAM HCL 2 MG/2ML IJ SOLN
INTRAMUSCULAR | Status: AC
  Filled 2017-05-16: qty 2

## 2017-05-16 MED ORDER — IOPAMIDOL (ISOVUE-370) INJECTION 76%
INTRAVENOUS | Status: AC
  Filled 2017-05-16: qty 100

## 2017-05-16 MED ORDER — FENTANYL CITRATE (PF) 100 MCG/2ML IJ SOLN
INTRAMUSCULAR | Status: DC | PRN
  Administered 2017-05-16: 25 ug via INTRAVENOUS

## 2017-05-16 MED ORDER — SODIUM CHLORIDE 0.9% FLUSH
3.0000 mL | Freq: Two times a day (BID) | INTRAVENOUS | Status: DC
  Administered 2017-05-16: 3 mL via INTRAVENOUS

## 2017-05-16 MED ORDER — MIDAZOLAM HCL 2 MG/2ML IJ SOLN
INTRAMUSCULAR | Status: DC | PRN
  Administered 2017-05-16: 1 mg via INTRAVENOUS

## 2017-05-16 MED ORDER — HEPARIN (PORCINE) IN NACL 2-0.9 UNIT/ML-% IJ SOLN
INTRAMUSCULAR | Status: AC
  Filled 2017-05-16: qty 1000

## 2017-05-16 MED ORDER — IOPAMIDOL (ISOVUE-370) INJECTION 76%
INTRAVENOUS | Status: DC | PRN
  Administered 2017-05-16: 40 mL via INTRA_ARTERIAL

## 2017-05-16 MED ORDER — SODIUM CHLORIDE 0.9% FLUSH: 3.0000 mL | INTRAVENOUS | Status: DC | PRN

## 2017-05-16 MED ORDER — LOSARTAN POTASSIUM 25 MG PO TABS
25.0000 mg | ORAL_TABLET | Freq: Every day | ORAL | Status: DC
  Administered 2017-05-16 – 2017-05-17 (×2): 25 mg via ORAL
  Filled 2017-05-16 (×2): qty 1

## 2017-05-16 MED ORDER — SODIUM CHLORIDE 0.9 % IV SOLN: 250.0000 mL | INTRAVENOUS | Status: DC | PRN

## 2017-05-16 MED ORDER — LIDOCAINE HCL (PF) 1 % IJ SOLN
INTRAMUSCULAR | Status: AC
  Filled 2017-05-16: qty 30

## 2017-05-16 MED ORDER — FENTANYL CITRATE (PF) 100 MCG/2ML IJ SOLN
INTRAMUSCULAR | Status: AC
  Filled 2017-05-16: qty 2

## 2017-05-16 MED ORDER — HEPARIN (PORCINE) IN NACL 2-0.9 UNIT/ML-% IJ SOLN
INTRAMUSCULAR | Status: AC | PRN
  Administered 2017-05-16: 1000 mL

## 2017-05-16 MED ORDER — HEPARIN SODIUM (PORCINE) 5000 UNIT/ML IJ SOLN
5000.0000 [IU] | Freq: Three times a day (TID) | INTRAMUSCULAR | Status: DC
  Administered 2017-05-16 – 2017-05-17 (×2): 5000 [IU] via SUBCUTANEOUS
  Filled 2017-05-16 (×2): qty 1

## 2017-05-16 SURGICAL SUPPLY — 14 items
CATH EXPO 5FR FR4 (CATHETERS) ×2 IMPLANT
CATH INFINITI 5FR JL4 (CATHETERS) ×2 IMPLANT
CATH LANGSTON DUAL LUM PIG 6FR (CATHETERS) ×2 IMPLANT
CATH SWAN GANZ 7F STRAIGHT (CATHETERS) ×2 IMPLANT
ELECT DEFIB PAD ADLT CADENCE (PAD) ×2 IMPLANT
KIT HEART LEFT (KITS) ×2 IMPLANT
KIT MICROINTRODUCER STIFF 5F (SHEATH) ×2 IMPLANT
PACK CARDIAC CATHETERIZATION (CUSTOM PROCEDURE TRAY) ×2 IMPLANT
SHEATH PINNACLE 6F 10CM (SHEATH) ×2 IMPLANT
SHEATH PINNACLE 7F 10CM (SHEATH) ×2 IMPLANT
TRANSDUCER W/STOPCOCK (MISCELLANEOUS) ×4 IMPLANT
WIRE EMERALD 3MM-J .025X260CM (WIRE) ×2 IMPLANT
WIRE EMERALD 3MM-J .035X150CM (WIRE) ×2 IMPLANT
WIRE EMERALD 3MM-J .035X260CM (WIRE) ×2 IMPLANT

## 2017-05-16 NOTE — Progress Notes (Signed)
Pt returned from cath lab. Telemetry applied, CCMD notified. VSS - set for V/S sequence per order. Pt eating lunch with no distress. R groin site level 0, will continue to monitor.   Leonidas Romberg, RN

## 2017-05-16 NOTE — Progress Notes (Signed)
Progress Note  Patient Name: Benjamin Frost Date of Encounter: 05/16/2017  Primary Cardiologist: [name]  Subjective   Feeling well but sore.  Inpatient Medications    Scheduled Meds: . aspirin EC  81 mg Oral Daily  . atorvastatin  40 mg Oral Daily  . carvedilol  3.125 mg Oral BID  . digoxin  0.125 mg Oral Daily  . heparin  5,000 Units Subcutaneous Q8H  . ivabradine  5 mg Oral BID WC  . mometasone-formoterol  2 puff Inhalation BID  . pantoprazole  40 mg Oral Daily  . tamsulosin  0.4 mg Oral Daily  . tiotropium  18 mcg Inhalation Daily   Continuous Infusions: . amiodarone 30 mg/hr (05/16/17 0301)   PRN Meds: acetaminophen, albuterol, nitroGLYCERIN, ondansetron (ZOFRAN) IV   Vital Signs    Vitals:   05/15/17 1913 05/16/17 0022 05/16/17 0400 05/16/17 0733  BP: 128/68 110/63 108/60   Pulse:      Resp: 14 19 15    Temp: 98.4 F (36.9 C) 98.4 F (36.9 C) 98.1 F (36.7 C)   TempSrc: Oral Oral Oral   SpO2: 94% 100% 98% 97%  Weight:   89.8 kg (197 lb 14.4 oz)     Intake/Output Summary (Last 24 hours) at 05/16/17 0756 Last data filed at 05/16/17 0703  Gross per 24 hour  Intake              600 ml  Output             1525 ml  Net             -925 ml   Filed Weights   05/16/17 0400  Weight: 89.8 kg (197 lb 14.4 oz)    Telemetry    Sinus rhythm.  PVCs - Personally Reviewed  ECG    Sinus rhythm.  Rate 61 bpm.  LBBB- Personally Reviewed  Physical Exam   GEN: No acute distress.   Neck: No JVD Cardiac: RRR, no murmurs, rubs, or gallops.  Respiratory: Clear to auscultation bilaterally. GI: Soft, nontender, non-distended  MS: No edema; No deformity. Neuro:  Nonfocal  Psych: Normal affect   Labs    Chemistry Recent Labs Lab 05/15/17 0825 05/15/17 0847 05/15/17 1912  NA 134* 139  --   K 3.9 3.9  --   CL 103 101  --   CO2 23  --   --   GLUCOSE 165* 170*  --   BUN 13 14  --   CREATININE 1.00 0.90 0.81  CALCIUM 8.4*  --   --   PROT 5.7*  --    --   ALBUMIN 3.5  --   --   AST 20  --   --   ALT 15*  --   --   ALKPHOS 47  --   --   BILITOT 1.0  --   --   GFRNONAA >60  --  >60  GFRAA >60  --  >60  ANIONGAP 8  --   --      Hematology Recent Labs Lab 05/15/17 0825 05/15/17 0847 05/15/17 1912  WBC 8.6  --  8.8  RBC 4.24  --  4.22  HGB 13.5 13.3 13.6  HCT 39.6 39.0 39.2  MCV 93.4  --  92.9  MCH 31.8  --  32.2  MCHC 34.1  --  34.7  RDW 13.2  --  13.2  PLT 149*  --  123*    Cardiac Enzymes Recent Labs  Lab 05/15/17 0825  TROPONINI <0.03    Recent Labs Lab 05/15/17 0845  TROPIPOC 0.01     BNPNo results for input(s): BNP, PROBNP in the last 168 hours.   DDimer No results for input(s): DDIMER in the last 168 hours.   Radiology    Dg Chest Portable 1 View  Result Date: 05/15/2017 CLINICAL DATA:  Status post syncopal episode during trend nail. EXAM: PORTABLE CHEST 1 VIEW COMPARISON:  CT scan chest of March 16, 2014 and chest x-ray of March 12, 2014 FINDINGS: The lungs are well-expanded. There is no focal infiltrate. There is no pleural effusion. The heart is top-normal in size. The central pulmonary vascularity is prominent. There is no significant pulmonary edema. There is calcification in the wall of the aortic arch. Ex the sternal pacemaker defibrillator pads are present. The bony thorax is unremarkable. IMPRESSION: There is no acute cardiopulmonary abnormality. Mild cardiomegaly, stable. Mild Interstitial prominence consistent with the patient's smoking history. Electronically Signed   By: David  Swaziland M.D.   On: 05/15/2017 08:46    Cardiac Studies   Dobutamine stress echo 2017/06/12: Study Conclusions  - Baseline: Aortic stenosis - mean gradient of 27 mmHg. LVEF 20%,   severe global hypokinesis with regional variation. Trivial AI. - Peak stress: Mean aortic valve gradient of 34 mmHg. LVEF 30-35%   with dobutamine challenge.  Impressions:  - LVEF 20% with severe global hypokinesis, increased to 30-35%  with   dobutamine challenge. The mean AOV gradient increased from 27   mmHg to 34 mmHg, with no change in the calculated aortic valve   area of 0.6 cm2 - based on an LVOT diameter of 2.0 cm. Findings   suggestive of true low gradient, low flow severe aortic stenosis.   This study suggests a degree of LV contractile reserve and that   LV function may improve with aortic valve replacement.   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   Assessment & Plan    # Ventricular fibrillation:  # Moderate aortic stenosis:    Benjamin Frost had an episode of ventricular fibrillation in the setting of dobutamine infusion. This was the likely cause. He did not have any symptoms of chest pain or shortness of breath prior to this episode.  Left heart cath in 2015 revealed nonobstructive disease.  The dobutamine stress echo showed an increase in the aortic valve gradient from 20 mmHg to 34 mmHg, which is still moderate aortic stenosis, but suggest that his LVEF may improve with valve replacement.  We will get a LHC/RHC and attempt to cross the aortic valve today.  We will stop amiodarone.   # Chronic systolic and diastolic heart failure:  Benjamin Frost is euvolemic and was doing well.  Continue carvedilol and ivabradine.  Resume losartan.  Given his LBBB, could consider upgrade to Bi-V ICD.   # Hyperlipidemia:  Continue rosuvastatin.    Signed, Chilton Si, MD  2017/06/12, 7:56 AM

## 2017-05-16 NOTE — Consult Note (Signed)
Inpatient TAVR Consultation:   Patient ID: Benjamin Frost; [id number]; June 16, 1941   Admit date: 05/15/2017 Date of Consult: 05/16/2017  Primary Care Provider: Rory Percy, MD Primary Cardiologist: [name]    Patient Profile:   Benjamin Frost is a 76 y.o. male with a hx of chronic systolic CHF, NICM, LBBB, HTN, HLD, moderate AS, moderate MR, sinus tachycardia, COPD with previous tobacco abuse and recent VF arrest during a dobuatmine echo who is being seen today for the evaluation of severe low flow low gradient aortic stenosis at the request of Dr. Duke Salvia.  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 He rides a Brewing technologist around the farm but is very active moving buckets of hay and tending to the farm. He does get shortness of breath with moderate activities such as climbing a flight of stairs or moving heavy buckets. He chronically sleeps in a recliner. He has all his natural teeth and regularly sees a dentist. He denies having a nickel allergy. He previously smoked heavily but quit in the 1990s. Patient reports having had a murmur since his teenage years and that his father had to have bypass surgery and a valve replacement.   [name] cardiac history dates back to 2012 when he was diagnosed with NICM. Cath in 11/2010 showed normal coronary arteries, EF 35% and mild AS. He was followed by Dr. Gwen Pounds. He had no significant symptoms of heart failure at that time.   He then underwent echocardiogram in 11/2013 which showed worsening of his NICM with an EF 15%. LHC at that time showed no obstructive CAD with only minor irregularities and severely reduced LVEF, 10-15% with mildly elevated filling pressures, normal CO and pulmonary pressures. He was discharged on medical therapy and a LifeVest. Shortly after that, the patient started experiencing significant shortness  of breath and cough with required 2 hospitalization at [facility].  He transferred care to [name] in [place]. He was placed on Coreg and spiro. He continued to have sinus tachycardia but BB was unable to be titrated given hypotension so he was started on Ivabradine with significant improvement in heart rate and symptoms.   He saw [name] with electrophysiology in 06/2014 for evaluation of ICD +/- CRT. His LBBB had transiently resolved at the time of evaluation. Given his profound clinical improvement, [name] discontinued LifeVest and advised watchful waiting with repeat 2D ECHO in 3 months. Plan was for ICD implantation if his EF remained <35%. It appears he was lost to follow up from EP since that time. However, [name] did mention need to refer back to EP for repeat evaluation of ICD +/- CRT in a recent office visit.   He has done quite well with stable NHYA class II symptoms and a good CHF regimen of spiro 25mg  daily, Losartan 25mg  daily, ivabradine 5mg  BID, Coreg 3.125mg  BID and digoxin 0.125mg  daily. He was doing so well, in fact, that [name] took him off digoxin in 06/2016. However, he developed worsening SOB that resolved quickly with resumption of digoxin.   2D ECHO 07/2016 showed EF 25-30%, mod AS with mod AI, mod MR.   He was last seen in the office by [name] in 01/2017. His physical exam suggested moderate to severe AS and a repeat 2D ECHO was arranged. This was completed on 05/01/17 and showed EF 25-30%, G1DD,  at least moderate AS (degree of stenosis felt underestimated 2/2 depressed EF), and mild MR. [name] recommended a dobutamine echocardiogram to further evaluate severity of aortic stenosis.   He presented to our [address] office on 05/15/17 for dobutamine stress test. With infusion 20 g of dobutamine he became unresponsive and went into ventricular fibrillation. CPR was initiated and he received epinephrine 1 mg. He was successfully defibrillated with one shock. CPR  time was reportedly 2-3 minutes.  His post-conversion rhythm was sinus with PVCs. He was started on IV amiodarone.  He regained consciousness and was able to follow commands. He reported no symptoms of chest pain or SOB prior to dobutamine infusion and arrest. He was admitted to [facility] under the care of Dr. Duke Salvia. IV amiodarone was discontinued today.   He currently feels well but complains of soreness in his chest. No LE edema or PND. He has chronic orthopnea that is unchanged. No chest pain or dizziness/syncope. No palpitation. No blood in his stool or urine.    Past Medical History:  Diagnosis Date  . Chronic back pain   . Chronic systolic heart failure (HCC)    Ejection fraction of 15%  . COPD (chronic obstructive pulmonary disease) (HCC)   . History of BPH   . Hyperlipidemia   . Hypertension   . Kidney stone   . Left bundle branch block   . Sinus tachycardia     Past Surgical History:  Procedure Laterality Date  . CARDIAC CATHETERIZATION  2012   [facility]  . CARDIAC CATHETERIZATION     [facility]  . LEFT AND RIGHT HEART CATHETERIZATION WITH CORONARY ANGIOGRAM N/A 03/23/2014   Procedure: LEFT AND RIGHT HEART CATHETERIZATION WITH CORONARY ANGIOGRAM;  Surgeon: [name], MD;  Location: [facility] CATH LAB;  Service: Cardiovascular;  Laterality: N/A;     Inpatient Medications: Scheduled Meds: . aspirin EC  81 mg Oral Daily  . atorvastatin  40 mg Oral Daily  . carvedilol  3.125 mg Oral BID  . digoxin  0.125 mg Oral Daily  . heparin  5,000 Units Subcutaneous Q8H  . ivabradine  5 mg Oral BID WC  . losartan  25 mg Oral Daily  . mometasone-formoterol  2 puff Inhalation BID  . pantoprazole  40 mg Oral Daily  . tamsulosin  0.4 mg Oral Daily  . tiotropium  18 mcg Inhalation Daily   Continuous Infusions:  PRN Meds: acetaminophen, albuterol, nitroGLYCERIN, ondansetron (ZOFRAN) IV  Allergies:    Allergies  Allergen Reactions  . Amlodipine Other (See Comments)    unknown  . Lisinopril Hives     Hives   . Metoprolol Nausea And Vomiting    Nausea and vomiting   . Norvasc [Amlodipine Besylate] Other (See Comments)    unknown    Social History:   Social History   Social History  . Marital status: Married    Spouse name: N/A  . Number of children: N/A  . Years of education: N/A   Occupational History  . Not on file.   Social History Main Topics  . Smoking status: Former Smoker    Years: 0.00    Types: Cigarettes  . Smokeless tobacco: Former Neurosurgeon    Types: Snuff  . Alcohol use Yes     Comment: occasional  . Drug use: No  . Sexual activity: Not on file   Other Topics Concern  . Not on file   Social History Narrative  . No narrative on file  Family History:   The patient's family history includes Heart disease in his father; Heart failure in his father; Hyperlipidemia in his mother; Hypertension in his mother.  ROS:  Please see the history of present illness.  ROS  All other ROS reviewed and negative.     Physical Exam/Data:   Vitals:   05/16/17 0400 05/16/17 0733 05/16/17 0805 05/16/17 1025  BP: 108/60  121/79   Pulse:   67 64  Resp: 15  12   Temp: 98.1 F (36.7 C)  (!) 97.4 F (36.3 C)   TempSrc: Oral  Oral   SpO2: 98% 97% 97%   Weight: 197 lb 14.4 oz (89.8 kg)     Height:   5\' 10"  (1.778 m)     Intake/Output Summary (Last 24 hours) at 05/16/17 1037 Last data filed at 05/16/17 0703  Gross per 24 hour  Intake              600 ml  Output             1525 ml  Net             -925 ml   Filed Weights   05/16/17 0400  Weight: 197 lb 14.4 oz (89.8 kg)   Body mass index is 28.4 kg/m.  General:  Well nourished, well developed, in no acute distress HEENT: normal Lymph: no adenopathy Neck: no JVD Endocrine:  No thryomegaly Vascular: No carotid bruits; FA pulses 2+ bilaterally without bruits  Cardiac:  normal S1, S2; RRR; 3/6 SEM @ RUSB Lungs:  clear to auscultation bilaterally, no wheezing, rhonchi or rales  Abd: soft, nontender, no  hepatomegaly  Ext: no edema Musculoskeletal:  No deformities, BUE and BLE strength normal and equal Skin: warm and dry  Neuro:  CNs 2-12 intact, no focal abnormalities noted Psych:  Normal affect   EKG:  The EKG was personally reviewed and demonstrates: NSR, HR 61, LBBB Telemetry:  Telemetry was personally reviewed and demonstrates: NSR with few PVCs  Relevant CV Studies: 2D ECHO: 04/14/2017 Study Conclusions - Left ventricle: The cavity size was mildly dilated. Systolic   function was severely reduced. The estimated ejection fraction   was in the range of 25% to 30%. Diffuse hypokinesis. Regional   wall motion abnormalities cannot be excluded. Doppler parameters   are consistent with abnormal left ventricular relaxation (grade 1   diastolic dysfunction). - Aortic valve: Poorly visualized. There was at least moderate   stenosis. Degree of stenosis likely underestimated secondary to   depressed EF. Peak velocity (S): 303 cm/s. Mean gradient (S): 20   mm Hg. Valve area (VTI): 0.84 cm^2. Valve area (Vmean): 0.79   cm^2. - Mitral valve: There was mild regurgitation. - Right ventricle: Systolic function was normal. - Pulmonary arteries: Systolic pressure was within the normal   range.   Dobutamine ECHO: 05/15/2017 Study Conclusions - Baseline: Aortic stenosis - mean gradient of 27 mmHg. LVEF 20%,   severe global hypokinesis with regional variation. Trivial AI. - Peak stress: Mean aortic valve gradient of 34 mmHg. LVEF 30-35%   with dobutamine challenge. Impressions: - LVEF 20% with severe global hypokinesis, increased to 30-35% with   dobutamine challenge. The mean AOV gradient increased from 27   mmHg to 34 mmHg, with no change in the calculated aortic valve   area of 0.6 cm2 - based on an LVOT diameter of 2.0 cm. Findings   suggestive of true low gradient, low flow severe aortic stenosis.  This study suggests a degree of LV contractile reserve and that   LV function may  improve with aortic valve replacement.  L/RHC: 2015 Left ventriculography: Left ventricular systolic function is  severely reduced  , LVEF is estimated at  10-15% %, there is  no significant mitral regurgitation  Final Conclusions:    1. Mildly elevated filling pressures with normal cardiac output.   2. No evidence of obstructive coronary artery disease with only minor irregularities. 3. Severely reduced LV systolic function. Recommendations:  Continue medical therapy for congestive heart failure nonischemic cardiomyopathy.    Laboratory Data:  Chemistry  Recent Labs Lab 05/15/17 0825 05/15/17 0847 05/15/17 1912  NA 134* 139  --   K 3.9 3.9  --   CL 103 101  --   CO2 23  --   --   GLUCOSE 165* 170*  --   BUN 13 14  --   CREATININE 1.00 0.90 0.81  CALCIUM 8.4*  --   --   GFRNONAA >60  --  >60  GFRAA >60  --  >60  ANIONGAP 8  --   --      Recent Labs Lab 05/15/17 0825  PROT 5.7*  ALBUMIN 3.5  AST 20  ALT 15*  ALKPHOS 47  BILITOT 1.0   Hematology  Recent Labs Lab 05/15/17 0825 05/15/17 0847 05/15/17 1912  WBC 8.6  --  8.8  RBC 4.24  --  4.22  HGB 13.5 13.3 13.6  HCT 39.6 39.0 39.2  MCV 93.4  --  92.9  MCH 31.8  --  32.2  MCHC 34.1  --  34.7  RDW 13.2  --  13.2  PLT 149*  --  123*   Cardiac Enzymes  Recent Labs Lab 05/15/17 0825  TROPONINI <0.03     Recent Labs Lab 05/15/17 0845  TROPIPOC 0.01    BNPNo results for input(s): BNP, PROBNP in the last 168 hours.  DDimer No results for input(s): DDIMER in the last 168 hours.  Radiology/Studies:  Dg Chest Portable 1 View  Result Date: 05/15/2017 CLINICAL DATA:  Status post syncopal episode during trend nail. EXAM: PORTABLE CHEST 1 VIEW COMPARISON:  CT scan chest of March 16, 2014 and chest x-ray of March 12, 2014 FINDINGS: The lungs are well-expanded. There is no focal infiltrate. There is no pleural effusion. The heart is top-normal in size. The central pulmonary vascularity is prominent. There is  no significant pulmonary edema. There is calcification in the wall of the aortic arch. Ex the sternal pacemaker defibrillator pads are present. The bony thorax is unremarkable. IMPRESSION: There is no acute cardiopulmonary abnormality. Mild cardiomegaly, stable. Mild Interstitial prominence consistent with the patient's smoking history. Electronically Signed   By: David  Swaziland M.D.   On: 05/15/2017 08:46     STS Risk Calculator: Procedure: AV Replacement  Risk of Mortality: 3.146%  Morbidity or Mortality: 20.309%  Long Length of Stay: 9.028%  Short Length of Stay: 28.341%  Permanent Stroke: 1.21%  Prolonged Ventilation: 13.148%  DSW Infection: 0.806%  Renal Failure: 4.337%  Reoperation: 9.102%    Assessment and Plan:   Kerrie Latour is a 76 y.o. male with symptoms of severe, stage D2 low flow/low gradient aortic stenosis with NYHA Class II symptoms. I have personally reviewed the patient's recent echocardiogram which is notable for reduced LV systolic function (EF 20%) and at least moderate aortic stenosis with peak gradient of 37 mmHg and mean transvalvular gradient of 20 mmHg. The patient's dimensionless  index is 0.3 and calculated aortic valve area is 0.84 cm.   Dobutamine echocardiogram showed LVEF 20% with severe global hypokinesis, increased to 30-35% with dobutamine challenge. The mean AOV gradient increased from 27 mmHg to 34 mmHg, with no change in the calculated aortic valve area of 0.6 cm2 - based on an LVOT diameter of 2.0 cm. Findings suggestive of true low gradient, low flow severe aortic stenosis. This study suggests a degree of LV contractile reserve and that LV function may improve with aortic valve replacement.  I have reviewed the natural history of aortic stenosis with the patient. We have discussed the limitations of medical therapy and the poor prognosis associated with symptomatic aortic stenosis. We have reviewed potential treatment options, including palliative  medical therapy, conventional surgical aortic valve replacement, and transcatheter aortic valve replacement. We discussed treatment options in the context of this patient's specific comorbid medical conditions.   The patient's predicted risk of mortality with conventional aortic valve replacement is 3.146% primarily based on age, LV dysfunction and COPD. Other significant comorbid conditions include HTN, LBBB, recent VF arrest and obesity. TAVR seems like a reasonable treatment option for this patient pending formal cardiac surgical consultation. We discussed typical evaluation which will require a gated cardiac CTA and a CTA of the chest/abdomen/pelvis to evaluate both his cardiac anatomy and peripheral vasculature. He will have his [facility] today. We also may consider an EP consult to see if ICD +/- CRT is indicated given recent VF arrest, LBBB and chronic systolic CHF. He has had stable NHYA class II CHF symptoms for many years and it is unclear how much is related to his heart failure and how much is related to his progressive AS.  I reviewed expectations and typical post-operative course with TAVR today.  The patient has been advised of a variety of complications that might develop including but not limited to risks of death, stroke, paravalvular leak, aortic dissection or other major vascular complications, aortic annulus rupture, device embolization, cardiac rupture or perforation, mitral regurgitation, acute myocardial infarction, arrhythmia, heart block or bradycardia requiring permanent pacemaker placement, congestive heart failure, respiratory failure, renal failure, pneumonia, infection, other late complications related to structural valve deterioration or migration, or other complications that might ultimately cause a temporary or permanent loss of functional independence or other long term morbidity. Will await further studies and cardiac surgical consultation to determine whether patient has  appropriate anatomy to proceed with TAVR.  Signed, Cline Crock, PA-C  05/16/2017 10:37 AM  Patient seen, examined. Available data reviewed. Agree with findings, assessment, and plan as outlined by Carlean Jews, PA-C. On my exam, the patient is alert and oriented, in no distress. He is a pleasant elderly gentleman. JVP is normal. Carotid upstrokes are delayed. Lungs are clear bilaterally. Heart is regular rate and rhythm with a harsh late peaking systolic murmur at the right upper sternal border. There is no diastolic murmur. Abdomen is soft and nontender. Extremities show no edema. Skin is warm and dry without rash.  I have reviewed all the patient's imaging studies. His echocardiogram demonstrates very severe LV dysfunction. His left ventricular contraction pattern appears dyssynchronous. The aortic valve is poorly visualized. There is clearly some degree of calcification and leaflet restriction but I cannot visually assess the valve very well because of poor acoustic windows. The patient's transvalvular gradients are in the moderate range.  In summary, the patient has fairly stable New York Heart Association functional class II- III symptoms of chronic systolic heart failure.  He clearly has limitation with physical activity but this is not regressed over the past year. He was undergoing dobutamine echo for assessment of low flow low gradient aortic stenosis and suffered a ventricular fibrillation cardiac arrest. Fortunately he seems to have no lasting effects of this after resuscitation and defibrillation. The result of his dobutamine echo did show some increase in transaortic gradient as well as augmentation in LV function. He did not need a gradient of 40 mmHg but the test was obviously terminated because of the patient's cardiac arrest. I think he has a long-standing severe cardiomyopathy that clearly predates any significant aortic stenosis. In addition he has a left bundle branch block with a  QRS duration of 160 ms. TAVR might be a reasonable treatment consideration, but I wonder if cardiac resynchronization should be done first since I believe his cardiomyopathy is the right Corrie Dandy problem at this point. Will discuss his case with the EP team. He has seen [name] in the past. He will undergo right and left heart catheterization today as detailed above. All of this is discussed at length with the patient and his family. Further plans pending the results of his heart catheterization in discussion with the EP team. I suspect he will be stable for hospital discharge in the near future.  [name], M.D. 05/16/2017 1:26 PM

## 2017-05-16 NOTE — Progress Notes (Signed)
18f sheath removed from RFA at 1323.  4f sheath removed from RFV at 1330.  Total manual pressure applied to site for 20 min.  Rt groin site is level 0 and rt PT pulse is palpable pre and post sheath removal.  Post sheath removal instruction given.  Pt acknowledges and understands these instructions.  Tegaderm dressing applied to site.  Bed rest starts at 1:45.

## 2017-05-16 NOTE — Interval H&P Note (Signed)
History and Physical Interval Note:  05/16/2017 11:56 AM  Benjamin Frost  has presented today for cardiac catherization, with the diagnosis of aortic stenosis and ventricular fibrillation. The various methods of treatment have been discussed with the patient and family. After consideration of risks, benefits and other options for treatment, the patient has consented to  Procedure(s): RIGHT/LEFT HEART CATH AND CORONARY ANGIOGRAPHY (N/A) as a surgical intervention .  The patient's history has been reviewed, patient examined, no change in status, stable for surgery.  I have reviewed the patient's chart and labs.  Questions were answered to the patient's satisfaction.    Cath Lab Visit (complete for each Cath Lab visit)  Clinical Evaluation Leading to the Procedure:   ACS: No.  Non-ACS:    Anginal Classification: No Symptoms (ventricular fibrillation and aortic stenosis)  Anti-ischemic medical therapy: Minimal Therapy (1 class of medications)  Non-Invasive Test Results: No non-invasive testing performed (LVEF 25-30%)  Prior CABG: No previous CABG  Benjamin Frost

## 2017-05-16 NOTE — Brief Op Note (Signed)
Brief Cardiac Catheterization Note  Date: 05/16/2017 Time: 1:07 PM  PATIENT:  Benjamin Frost  76 y.o. male  PRE-OPERATIVE DIAGNOSIS:  aortic stenosis, ventricular fibrillation  POST-OPERATIVE DIAGNOSIS:  same  PROCEDURE:  Procedure(s): RIGHT/LEFT HEART CATH AND CORONARY ANGIOGRAPHY (N/A)  SURGEON:  Surgeon(s) and Role:    [name], MD - Primary  FINDINGS: 1. No angiographically significant coronary artery disease. 2. Mildly elevated left heart filling pressures. 3. Moderately elevated right heart filling pressure. 4. Mild pulmonary hypertension. 5. Low normal Fick cardiac output. Normal to high thermal dilutional cardiac output (suspect this is artifactually high). 6. Likely moderate aortic stenosis with mean aortic valve gradient of 20 mmHg. Aortic valve was crossed without difficulty.  RECOMMENDATIONS: 1. Continue medical therapy of chronic systolic heart failure secondary to nonischemic cardiomyopathy and moderate aortic stenosis.  [name], MD [facility] Pager: [phone]

## 2017-05-16 NOTE — H&P (View-Only) (Signed)
Progress Note  Patient Name: Benjamin Frost Date of Encounter: 05/16/2017  Primary Cardiologist: [name]  Subjective   Feeling well but sore.  Inpatient Medications    Scheduled Meds: . aspirin EC  81 mg Oral Daily  . atorvastatin  40 mg Oral Daily  . carvedilol  3.125 mg Oral BID  . digoxin  0.125 mg Oral Daily  . heparin  5,000 Units Subcutaneous Q8H  . ivabradine  5 mg Oral BID WC  . mometasone-formoterol  2 puff Inhalation BID  . pantoprazole  40 mg Oral Daily  . tamsulosin  0.4 mg Oral Daily  . tiotropium  18 mcg Inhalation Daily   Continuous Infusions: . amiodarone 30 mg/hr (05/16/17 0301)   PRN Meds: acetaminophen, albuterol, nitroGLYCERIN, ondansetron (ZOFRAN) IV   Vital Signs    Vitals:   05/15/17 1913 05/16/17 0022 05/16/17 0400 05/16/17 0733  BP: 128/68 110/63 108/60   Pulse:      Resp: 14 19 15    Temp: 98.4 F (36.9 C) 98.4 F (36.9 C) 98.1 F (36.7 C)   TempSrc: Oral Oral Oral   SpO2: 94% 100% 98% 97%  Weight:   89.8 kg (197 lb 14.4 oz)     Intake/Output Summary (Last 24 hours) at 05/16/17 0756 Last data filed at 05/16/17 0703  Gross per 24 hour  Intake              600 ml  Output             1525 ml  Net             -925 ml   Filed Weights   05/16/17 0400  Weight: 89.8 kg (197 lb 14.4 oz)    Telemetry    Sinus rhythm.  PVCs - Personally Reviewed  ECG    Sinus rhythm.  Rate 61 bpm.  LBBB- Personally Reviewed  Physical Exam   GEN: No acute distress.   Neck: No JVD Cardiac: RRR, no murmurs, rubs, or gallops.  Respiratory: Clear to auscultation bilaterally. GI: Soft, nontender, non-distended  MS: No edema; No deformity. Neuro:  Nonfocal  Psych: Normal affect   Labs    Chemistry Recent Labs Lab 05/15/17 0825 05/15/17 0847 05/15/17 1912  NA 134* 139  --   K 3.9 3.9  --   CL 103 101  --   CO2 23  --   --   GLUCOSE 165* 170*  --   BUN 13 14  --   CREATININE 1.00 0.90 0.81  CALCIUM 8.4*  --   --   PROT 5.7*  --    --   ALBUMIN 3.5  --   --   AST 20  --   --   ALT 15*  --   --   ALKPHOS 47  --   --   BILITOT 1.0  --   --   GFRNONAA >60  --  >60  GFRAA >60  --  >60  ANIONGAP 8  --   --      Hematology Recent Labs Lab 05/15/17 0825 05/15/17 0847 05/15/17 1912  WBC 8.6  --  8.8  RBC 4.24  --  4.22  HGB 13.5 13.3 13.6  HCT 39.6 39.0 39.2  MCV 93.4  --  92.9  MCH 31.8  --  32.2  MCHC 34.1  --  34.7  RDW 13.2  --  13.2  PLT 149*  --  123*    Cardiac Enzymes Recent Labs  Lab 05/15/17 0825  TROPONINI <0.03    Recent Labs Lab 05/15/17 0845  TROPIPOC 0.01     BNPNo results for input(s): BNP, PROBNP in the last 168 hours.   DDimer No results for input(s): DDIMER in the last 168 hours.   Radiology    Dg Chest Portable 1 View  Result Date: 05/15/2017 CLINICAL DATA:  Status post syncopal episode during trend nail. EXAM: PORTABLE CHEST 1 VIEW COMPARISON:  CT scan chest of March 16, 2014 and chest x-ray of March 12, 2014 FINDINGS: The lungs are well-expanded. There is no focal infiltrate. There is no pleural effusion. The heart is top-normal in size. The central pulmonary vascularity is prominent. There is no significant pulmonary edema. There is calcification in the wall of the aortic arch. Ex the sternal pacemaker defibrillator pads are present. The bony thorax is unremarkable. IMPRESSION: There is no acute cardiopulmonary abnormality. Mild cardiomegaly, stable. Mild Interstitial prominence consistent with the patient's smoking history. Electronically Signed   By: David  Swaziland M.D.   On: 05/15/2017 08:46    Cardiac Studies   Dobutamine stress echo 2017/06/12: Study Conclusions  - Baseline: Aortic stenosis - mean gradient of 27 mmHg. LVEF 20%,   severe global hypokinesis with regional variation. Trivial AI. - Peak stress: Mean aortic valve gradient of 34 mmHg. LVEF 30-35%   with dobutamine challenge.  Impressions:  - LVEF 20% with severe global hypokinesis, increased to 30-35%  with   dobutamine challenge. The mean AOV gradient increased from 27   mmHg to 34 mmHg, with no change in the calculated aortic valve   area of 0.6 cm2 - based on an LVOT diameter of 2.0 cm. Findings   suggestive of true low gradient, low flow severe aortic stenosis.   This study suggests a degree of LV contractile reserve and that   LV function may improve with aortic valve replacement.   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   Assessment & Plan    # Ventricular fibrillation:  # Moderate aortic stenosis:    [name] had an episode of ventricular fibrillation in the setting of dobutamine infusion. This was the likely cause. He did not have any symptoms of chest pain or shortness of breath prior to this episode.  Left heart cath in 2015 revealed nonobstructive disease.  The dobutamine stress echo showed an increase in the aortic valve gradient from 20 mmHg to 34 mmHg, which is still moderate aortic stenosis, but suggest that his LVEF may improve with valve replacement.  We will get a LHC/RHC and attempt to cross the aortic valve today.  We will stop amiodarone.   # Chronic systolic and diastolic heart failure:  [name] is euvolemic and was doing well.  Continue carvedilol and ivabradine.  Resume losartan.  Given his LBBB, could consider upgrade to Bi-V ICD.   # Hyperlipidemia:  Continue rosuvastatin.    Signed, Chilton Si, MD  2017/06/12, 7:56 AM

## 2017-05-16 NOTE — Progress Notes (Signed)
Pt seen with Dr Excell Seltzer. Cath today with no obstructive CAD. No plans for AVR at this time. Discussed with Dr Johney Frame, plan for LifeVest followed by CRTD implant in a couple of weeks. LifeVest ordered. Appt made for Dr Johney Frame next week. Pt scheduled for CRTD 05/29/17.   Gypsy Balsam, NP 05/16/2017 3:03 PM

## 2017-05-16 NOTE — Progress Notes (Signed)
NCM received call from [facility] stating patient for discharge tomorrow and will need life vest.  NCM contacted Rosanne Ashing with [employer], gave him the information, he states he will contact MD , NCM did not see an order yet for vest.

## 2017-05-17 ENCOUNTER — Encounter (HOSPITAL_COMMUNITY): Payer: Self-pay | Admitting: Physician Assistant

## 2017-05-17 DIAGNOSIS — I350 Nonrheumatic aortic (valve) stenosis: Secondary | ICD-10-CM

## 2017-05-17 DIAGNOSIS — I10 Essential (primary) hypertension: Secondary | ICD-10-CM | POA: Diagnosis present

## 2017-05-17 DIAGNOSIS — I428 Other cardiomyopathies: Secondary | ICD-10-CM

## 2017-05-17 MED ORDER — TRAMADOL HCL 50 MG PO TABS: 50.0000 mg | ORAL_TABLET | Freq: Three times a day (TID) | ORAL | 0 refills | Status: DC | PRN

## 2017-05-17 MED ORDER — TRAMADOL HCL 50 MG PO TABS
50.0000 mg | ORAL_TABLET | Freq: Four times a day (QID) | ORAL | Status: DC | PRN
  Administered 2017-05-17: 50 mg via ORAL
  Filled 2017-05-17: qty 1

## 2017-05-17 NOTE — Discharge Instructions (Signed)

## 2017-05-17 NOTE — Care Management Note (Signed)
Case Management Note  Patient Details  Name: Benjamin Frost MRN: [id number] Date of Birth: March 28, 1941  Subjective/Objective:                 Spoke w [employer] Life Vest, [phone], patient approved for Life Vest and will be fitted today by Dennis Bast likely before lunch time in anticipation of DC today. No other CM needs/ orders identified.    Action/Plan:  DC to home w Life Vest Expected Discharge Date:  05/17/17               Expected Discharge Plan:  Home/Self Care  In-House Referral:     Discharge planning Services  CM Consult  Post Acute Care Choice:    Choice offered to:     DME Arranged:   (Life Vest) DME Agency:  [facility]  HH Arranged:    HH Agency:     Status of Service:  Completed, signed off  If discussed at Long Length of Stay Meetings, dates discussed:    Additional Comments:  Lawerance Sabal, RN 05/17/2017, 10:24 AM

## 2017-05-17 NOTE — Discharge Summary (Addendum)
Discharge Summary    Patient ID: Benjamin Frost,  MRN: [id number], DOB/AGE: 1941/05/09 76 y.o.  Admit date: 05/15/2017 Discharge date: 05/17/2017  Primary Care Provider: Rory Percy Primary Cardiologist: [name]   Discharge Diagnoses    Principal Problem:   Ventricular fibrillation [facility]) Active Problems:   Chronic systolic heart failure (HCC)   Sinus tachycardia   Left bundle branch block   Moderate aortic stenosis   Nonischemic cardiomyopathy (HCC)   Hypertension   Allergies Allergies  Allergen Reactions  . Amlodipine Other (See Comments)    unknown  . Lisinopril Hives    Hives   . Metoprolol Nausea And Vomiting    Nausea and vomiting   . Norvasc [Amlodipine Besylate] Other (See Comments)    unknown     History of Present Illness     Benjamin Frost is a 76 y.o. male with a hx of chronic systolic CHF, NICM, LBBB, HTN, HLD, moderate-severe AS, moderate MR, sinus tachycardia, COPD with previous tobacco abuse who was admitted from the office on  for a VF arrest during a dobuatmine echo.   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 He rides a Brewing technologist around the farm but is very active moving buckets of hay and tending to the farm. He does get shortness of breath with moderate activities such as climbing a flight of stairs or moving heavy buckets. He chronically sleeps in a recliner. He has all his natural teeth and regularly sees a dentist. He denies having a nickel allergy. He previously smoked heavily but quit in the 1990s. Patient reports having had a murmur since his teenage years and that his father had to have bypass surgery and a valve replacement.   Benjamin Frost cardiac history dates back to 2012 when he was diagnosed with NICM. Cath in 11/2010 showed normal coronary arteries, EF 35% and mild AS. He was followed by Dr. Gwen Pounds. He had no significant symptoms  of heart failure at that time.   He then underwent echocardiogram in 11/2013 which showed worsening of his NICM with an EF 15%. LHC at that time showed no obstructive CAD with only minor irregularities and severely reduced LVEF, 10-15% with mildly elevated filling pressures, normal CO and pulmonary pressures. He was discharged on medical therapy and a LifeVest. Shortly after that, the patient started experiencing significant shortness of breath and cough with required 2 hospitalization at [facility].  He transferred care to [name] in [place]. He saw [name] with electrophysiology in 06/2014 for evaluation of ICD +/- CRT. His LBBB had transiently resolved at the time of evaluation. Given his profound clinical improvement, [name] discontinued LifeVest and advised watchful waiting with repeat 2D ECHO in 3 months. Plan was for ICD implantation if his EF remained <35%. It appears he was lost to follow up from EP since that time. However, [name] did mention need to refer back to EP for repeat evaluation of ICD +/- CRT in a recent office visit.   He has done quite well with stable NHYA class II symptoms and a good CHF regimen of spiro 25mg  daily, Losartan 25mg  daily, ivabradine 5mg  BID, Coreg 3.125mg  BID and digoxin 0.125mg  daily. He was doing so well, in fact, that [name] took him off digoxin in 06/2016. However, he developed worsening SOB that resolved quickly with resumption of digoxin.   2D ECHO 07/2016  showed EF 25-30%, mod AS with mod AI, mod MR.   He was last seen in the office by [name] in 01/2017. His physical exam suggested moderate to severe AS and a repeat 2D ECHO was arranged. This was completed on 05/01/17 and showed EF 25-30%, G1DD, at least moderate AS (degree of stenosis felt underestimated 2/2 depressed EF), and mild MR. [name] recommended a dobutamine echocardiogram to further evaluate severity of aortic stenosis.   He presented to our [address] office on 05/15/17 for  dobutamine stress test. With infusion 20 g of dobutamine he became unresponsive and went into ventricular fibrillation. CPR was initiated and he received epinephrine 1 mg. He was successfully defibrillated with one shock. CPR time was reportedly 2-3 minutes. His post-conversion rhythm was sinus with PVCs. He was started on IV amiodarone. He regained consciousness and was able to follow commands. He reported no symptoms of chest pain or SOB prior to dobutamine infusion and arrest. He was admitted to [facility] under the care of Dr. Duke Salvia.  Hospital Course     Consultants: none  He was started on IV amiodarone which was discontinued the following day as he has remained in NSR with occasional PVCs. He was seen by the multidisciplinary valve team for consideration of TAVR who felt he was a reasonable candidate for TAVR but were not convinced that his aortic stenosis was in the severe range yet. LHC showed no angiographically significant coronary artery disease, mildly elevated left heart filling pressures, mild pulmonary hypertension, low normal Fick cardiac output, normal to high thermal dilutional cardiac output and likely moderate aortic stenosis with mean aortic valve gradient of 20 mmHg, calculated valve area of ~1.4 cm^2. Aortic valve was crossed without difficulty. He has New York Heart Association functional class II- III symptoms of chronic systolic heart failure and his longstanding cardiomyopathy has clearly predated any progression of his AS. [name] discussed case with [name] and it was ultimately decided to pursue LifeVest placement followed by CRT-D. He will see [name] in the office on 05/19/17 for discussion on CRT-D placement. TAVR team will follow at a distance and recommend follow up echo after 3 months of CRT to reassess LV function and aortic stenosis.    The patient has had an uncomplicated hospital course and is recovering well. The femoral catheter site is stable. He has been  seen by Dr. Delton See today and deemed ready for discharge home. All follow-up appointments have been scheduled. Discharge medications are listed below.  _____________  Discharge Vitals Blood pressure 116/65, pulse 79, temperature 98 F (36.7 C), temperature source Oral, resp. rate 18, height 5\' 10"  (1.778 m), weight 199 lb (90.3 kg), SpO2 92 %.  Filed Weights   05/16/17 0400 05/17/17 0400  Weight: 197 lb 14.4 oz (89.8 kg) 199 lb (90.3 kg)   VS:  BP 116/65 (BP Location: Left Arm)   Pulse 79   Temp 98 F (36.7 C) (Oral)   Resp 18   Ht 5\' 10"  (1.778 m)   Wt 199 lb (90.3 kg)   SpO2 92%   BMI 28.55 kg/m    GEN: Well nourished, well developed, in no acute distress  HEENT: normal  Neck: no JVD, carotid bruits, or masses Cardiac: RRR; no murmurs, rubs, or gallops,no edema  Respiratory:  clear to auscultation bilaterally, normal work of breathing GI: soft, nontender, nondistended, + BS MS: no deformity or atrophy  Skin: warm and dry, no rash Neuro:  Alert and Oriented x  3, Strength and sensation are intact Psych: euthymic mood, full affect    Labs & Radiologic Studies     CBC  Recent Labs  05/15/17 0825 05/15/17 0847 05/15/17 1912  WBC 8.6  --  8.8  NEUTROABS 5.4  --   --   HGB 13.5 13.3 13.6  HCT 39.6 39.0 39.2  MCV 93.4  --  92.9  PLT 149*  --  123*   Basic Metabolic Panel  Recent Labs  05/15/17 0825 05/15/17 0847 05/15/17 1912  NA 134* 139  --   K 3.9 3.9  --   CL 103 101  --   CO2 23  --   --   GLUCOSE 165* 170*  --   BUN 13 14  --   CREATININE 1.00 0.90 0.81  CALCIUM 8.4*  --   --   MG 1.9  --   --    Liver Function Tests  Recent Labs  05/15/17 0825  AST 20  ALT 15*  ALKPHOS 47  BILITOT 1.0  PROT 5.7*  ALBUMIN 3.5   No results for input(s): LIPASE, AMYLASE in the last 72 hours. Cardiac Enzymes  Recent Labs  05/15/17 0825  TROPONINI <0.03   BNP Invalid input(s): POCBNP D-Dimer No results for input(s): DDIMER in the last 72  hours. Hemoglobin A1C No results for input(s): HGBA1C in the last 72 hours. Fasting Lipid Panel No results for input(s): CHOL, HDL, LDLCALC, TRIG, CHOLHDL, LDLDIRECT in the last 72 hours. Thyroid Function Tests No results for input(s): TSH, T4TOTAL, T3FREE, THYROIDAB in the last 72 hours.  Invalid input(s): FREET3  Dg Chest Portable 1 View  Result Date: 05/15/2017 CLINICAL DATA:  Status post syncopal episode during trend nail. EXAM: PORTABLE CHEST 1 VIEW COMPARISON:  CT scan chest of March 16, 2014 and chest x-ray of March 12, 2014 FINDINGS: The lungs are well-expanded. There is no focal infiltrate. There is no pleural effusion. The heart is top-normal in size. The central pulmonary vascularity is prominent. There is no significant pulmonary edema. There is calcification in the wall of the aortic arch. Ex the sternal pacemaker defibrillator pads are present. The bony thorax is unremarkable. IMPRESSION: There is no acute cardiopulmonary abnormality. Mild cardiomegaly, stable. Mild Interstitial prominence consistent with the patient's smoking history. Electronically Signed   By: David  Swaziland M.D.   On: 05/15/2017 08:46     Diagnostic Studies/Procedures    05/16/17 RIGHT/LEFT HEART CATH AND CORONARY ANGIOGRAPHY   Conclusions: 1. No angiographically significant coronary artery disease. 2. Mildly elevated left heart filling pressures. 3. Moderately elevated right heart filling pressure. 4. Mild pulmonary hypertension. 5. Low normal Fick cardiac output. Normal to high thermal dilutional cardiac output (I suspect this is artifactually high). 6. Likely moderate aortic stenosis with mean aortic valve gradient of 20 mmHg, calculated valve area of ~1.4 cm^2. Aortic valve was crossed without difficulty. Recommendations: 1. Continue medical therapy of chronic systolic heart failure secondary to nonischemic cardiomyopathy and moderate aortic stenosis.    _____________  2D ECHO: 04/14/2017 Study  Conclusions - Left ventricle: The cavity size was mildly dilated. Systolic function was severely reduced. The estimated ejection fraction was in the range of 25% to 30%. Diffuse hypokinesis. Regional wall motion abnormalities cannot be excluded. Doppler parameters are consistent with abnormal left ventricular relaxation (grade 1 diastolic dysfunction). - Aortic valve: Poorly visualized. There was at least moderate stenosis. Degree of stenosis likely underestimated secondary to depressed EF. Peak velocity (S): 303 cm/s. Mean gradient (S): 20  mm Hg. Valve area (VTI): 0.84 cm^2. Valve area (Vmean): 0.79 cm^2. - Mitral valve: There was mild regurgitation. - Right ventricle: Systolic function was normal. - Pulmonary arteries: Systolic pressure was within the normal range.  _____________  Dobutamine ECHO: 05/15/2017 Study Conclusions - Baseline: Aortic stenosis - mean gradient of 27 mmHg. LVEF 20%, severe global hypokinesis with regional variation. Trivial AI. - Peak stress: Mean aortic valve gradient of 34 mmHg. LVEF 30-35% with dobutamine challenge. Impressions: - LVEF 20% with severe global hypokinesis, increased to 30-35% with dobutamine challenge. The mean AOV gradient increased from 27 mmHg to 34 mmHg, with no change in the calculated aortic valve area of 0.6 cm2 - based on an LVOT diameter of 2.0 cm. Findings suggestive of true low gradient, low flow severe aortic stenosis. This study suggests a degree of LV contractile reserve and that LV function may improve with aortic valve replacement.   Disposition   Pt is being discharged home today in good condition.  Follow-up Plans & Appointments    Follow-up Information    Hillis Range, MD Follow up on 05/19/2017.   Specialty:  Cardiology Why:  at 4:30PM Contact information: [address] [place] [address] [phone]        [facility] Follow up.   Why:  [place] to  be fitted with [facility] Life Vest prior to DC.            Discharge Medications     Medication List    TAKE these medications   aspirin 81 MG tablet Take 81 mg by mouth daily.   atorvastatin 40 MG tablet Commonly known as:  LIPITOR Take 40 mg by mouth daily.   carvedilol 3.125 MG tablet Commonly known as:  COREG Take 1 tablet (3.125 mg total) by mouth 2 (two) times daily.   digoxin 0.125 MG tablet Commonly known as:  LANOXIN Take 1 tablet (0.125 mg total) by mouth daily.   Fluticasone-Salmeterol 250-50 MCG/DOSE Aepb Commonly known as:  ADVAIR Inhale 1 puff into the lungs 2 (two) times daily.   ivabradine 5 MG Tabs tablet Commonly known as:  CORLANOR Take 1 tablet (5 mg total) by mouth 2 (two) times daily with a meal.   losartan 25 MG tablet Commonly known as:  COZAAR Take 1 tablet (25 mg total) by mouth daily.   omeprazole 20 MG capsule Commonly known as:  PRILOSEC Take 20 mg by mouth daily.   PROAIR HFA 108 (90 Base) MCG/ACT inhaler Generic drug:  albuterol Inhale 2 puffs into the lungs every 6 (six) hours as needed for wheezing or shortness of breath.   spironolactone 25 MG tablet Commonly known as:  ALDACTONE Take 1 tablet (25 mg total) by mouth daily.   tamsulosin 0.4 MG Caps capsule Commonly known as:  FLOMAX Take 0.4 mg by mouth daily.   tiotropium 18 MCG inhalation capsule Commonly known as:  SPIRIVA Place 18 mcg into inhaler and inhale daily.   traMADol 50 MG tablet Commonly known as:  ULTRAM Take 1 tablet (50 mg total) by mouth 3 (three) times daily as needed for moderate pain.   vitamin C 500 MG tablet Commonly known as:  ASCORBIC ACID Take 500 mg by mouth daily.         Outstanding Labs/Studies   None.  Duration of Discharge Encounter   Greater than 30 minutes including physician time.  Signed, Cline Crock PA-C 05/17/2017, 11:01 AM   The patient was seen, examined and discussed with Florentina Addison  Janee Morn, PA-C and I agree with  the above.   54M with chronic systolic and diastolic heart failure (LVEF 25-30%), moderate aortic stenosis, LBBB, and COPD who had VF arrest during dobutamine stress test on 8/23. The dobutamine stress echo showed an increase in the aortic valve gradient from 20 mmHg to 34 mmHg, which is still moderate aortic stenosis, but suggest that his LVEF may improve with valve replacement. LHC showed no angiographically significant coronary artery disease. Mildly elevated left heart filling pressures. Mild pulmonary hypertension. Low normal Fick cardiac output. Normal to high thermal dilutional cardiac output. Likely moderate aortic stenosis with mean aortic valve gradient of 20 mmHg, calculated valve area of ~1.4 cm^2. Aortic valve was crossed without difficulty. He appears euvolemic and was doing well. Continue carvedilol and ivabradine. Resume losartan. Given his LBBB, upgrade to BiV ICD in a couple of weeks.  Plan for LifeVest today.  Appt made for Dr Johney Frame next week. Pt scheduled for CRTD 05/29/17.  BP, HR WNL, telemetry shows SR, occassional PVCs.  The patient has significant pain from chest compressions, we will prescribe 10 tablets of Tramadol 50 mg PO TID PRN. We will arrange for a follow up within 7-10 days.  Stable for a discharge.  Tobias Alexander, MD 05/17/2017

## 2017-05-17 NOTE — Progress Notes (Addendum)
The patient was seen, examined and discussed with Benjamin Jews, PA-C and I agree with the above.   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 The dobutamine stress echo showed an increase in the aortic valve gradient from 20 mmHg to 34 mmHg, which is still moderate aortic stenosis, but suggest that his LVEF may improve with valve replacement.  LHC showed no angiographically significant coronary artery disease. Mildly elevated left heart filling pressures. Mild pulmonary hypertension. Low normal Fick cardiac output. Normal to high thermal dilutional cardiac output. Likely moderate aortic stenosis with mean aortic valve gradient of 20 mmHg, calculated valve area of ~1.4 cm^2. Aortic valve was crossed without difficulty. He appears euvolemic and was doing well. Continue carvedilol and ivabradine.  Resume losartan. Given his LBBB, upgrade to BiV ICD in a couple of weeks.  Plan for LifeVest today.  Appt made for Dr Johney Frame next week. Pt scheduled for CRTD 05/29/17.  BP, HR WNL, telemetry shows SR, occassional PVCs.  The patient has significant pain from chest compressions, we will prescribe 10 tablets of Tramadol 50 mg PO TID PRN. We will arrange for a follow up within 7-10 days.  Stable for a discharge.   Tobias Alexander, MD 05/17/2017

## 2017-05-19 ENCOUNTER — Ambulatory Visit: Payer: Medicare Other | Admitting: Internal Medicine

## 2017-05-19 ENCOUNTER — Encounter (HOSPITAL_COMMUNITY): Payer: Self-pay | Admitting: Internal Medicine

## 2017-05-19 ENCOUNTER — Ambulatory Visit: Payer: Medicare Other | Admitting: Cardiovascular Disease

## 2017-05-29 ENCOUNTER — Ambulatory Visit (HOSPITAL_COMMUNITY)
Admission: RE | Admit: 2017-05-29 | Discharge: 2017-05-30 | Disposition: A | Discharge status: Home / Self Care | Payer: Medicare Other | Source: Ambulatory Visit | Attending: Internal Medicine | Admitting: Internal Medicine

## 2017-05-29 ENCOUNTER — Encounter (HOSPITAL_COMMUNITY): Payer: Self-pay | Admitting: *Deleted

## 2017-05-29 ENCOUNTER — Ambulatory Visit (HOSPITAL_COMMUNITY)
Admission: RE | Disposition: A | Discharge status: Home / Self Care | Payer: Self-pay | Source: Ambulatory Visit | Attending: Internal Medicine

## 2017-05-29 DIAGNOSIS — I4901 Ventricular fibrillation: Secondary | ICD-10-CM | POA: Insufficient documentation

## 2017-05-29 DIAGNOSIS — E785 Hyperlipidemia, unspecified: Secondary | ICD-10-CM | POA: Insufficient documentation

## 2017-05-29 DIAGNOSIS — J449 Chronic obstructive pulmonary disease, unspecified: Secondary | ICD-10-CM | POA: Insufficient documentation

## 2017-05-29 DIAGNOSIS — I462 Cardiac arrest due to underlying cardiac condition: Secondary | ICD-10-CM | POA: Diagnosis not present

## 2017-05-29 DIAGNOSIS — I447 Left bundle-branch block, unspecified: Secondary | ICD-10-CM | POA: Insufficient documentation

## 2017-05-29 DIAGNOSIS — I428 Other cardiomyopathies: Secondary | ICD-10-CM

## 2017-05-29 DIAGNOSIS — Z8249 Family history of ischemic heart disease and other diseases of the circulatory system: Secondary | ICD-10-CM | POA: Insufficient documentation

## 2017-05-29 DIAGNOSIS — G8929 Other chronic pain: Secondary | ICD-10-CM | POA: Diagnosis not present

## 2017-05-29 DIAGNOSIS — M549 Dorsalgia, unspecified: Secondary | ICD-10-CM | POA: Insufficient documentation

## 2017-05-29 DIAGNOSIS — I5022 Chronic systolic (congestive) heart failure: Secondary | ICD-10-CM | POA: Insufficient documentation

## 2017-05-29 DIAGNOSIS — Z87891 Personal history of nicotine dependence: Secondary | ICD-10-CM | POA: Insufficient documentation

## 2017-05-29 DIAGNOSIS — N400 Enlarged prostate without lower urinary tract symptoms: Secondary | ICD-10-CM | POA: Diagnosis not present

## 2017-05-29 DIAGNOSIS — I350 Nonrheumatic aortic (valve) stenosis: Secondary | ICD-10-CM | POA: Insufficient documentation

## 2017-05-29 DIAGNOSIS — I110 Hypertensive heart disease with heart failure: Secondary | ICD-10-CM | POA: Diagnosis not present

## 2017-05-29 DIAGNOSIS — I509 Heart failure, unspecified: Secondary | ICD-10-CM | POA: Diagnosis not present

## 2017-05-29 DIAGNOSIS — Z959 Presence of cardiac and vascular implant and graft, unspecified: Secondary | ICD-10-CM

## 2017-05-29 HISTORY — PX: BIV ICD INSERTION CRT-D: EP1195

## 2017-05-29 LAB — BASIC METABOLIC PANEL
Anion gap: 8 (ref 5–15)
BUN: 12 mg/dL (ref 6–20)
CO2: 28 mmol/L (ref 22–32)
Calcium: 9.5 mg/dL (ref 8.9–10.3)
Chloride: 103 mmol/L (ref 101–111)
Creatinine, Ser: 0.86 mg/dL (ref 0.61–1.24)
GFR calc Af Amer: >60 mL/min (ref >60)
GLUCOSE: 114 mg/dL — AB (ref 65–99)
POTASSIUM: 4.2 mmol/L (ref 3.5–5.1)
Sodium: 139 mmol/L (ref 135–145)

## 2017-05-29 LAB — SURGICAL PCR SCREEN
MRSA, PCR: NEGATIVE
STAPHYLOCOCCUS AUREUS: POSITIVE — AB

## 2017-05-29 LAB — CBC
HCT: 40.9 % (ref 39.0–52.0)
Hemoglobin: 13.8 g/dL (ref 13.0–17.0)
MCH: 31.4 pg (ref 26.0–34.0)
MCHC: 33.7 g/dL (ref 30.0–36.0)
MCV: 93.2 fL (ref 78.0–100.0)
PLATELETS: 142 10*3/uL — AB (ref 150–400)
RBC: 4.39 MIL/uL (ref 4.22–5.81)
RDW: 12.9 % (ref 11.5–15.5)
WBC: 7.7 10*3/uL (ref 4.0–10.5)

## 2017-05-29 LAB — PROTIME-INR
INR: 1
Prothrombin Time: 13.1 seconds (ref 11.4–15.2)

## 2017-05-29 SURGERY — BIV ICD INSERTION CRT-D

## 2017-05-29 MED ORDER — HEPARIN (PORCINE) IN NACL 2-0.9 UNIT/ML-% IJ SOLN
INTRAMUSCULAR | Status: AC
  Filled 2017-05-29: qty 500

## 2017-05-29 MED ORDER — DIGOXIN 125 MCG PO TABS
0.1250 mg | ORAL_TABLET | Freq: Every day | ORAL | Status: DC
  Administered 2017-05-30: 0.125 mg via ORAL
  Filled 2017-05-29: qty 1

## 2017-05-29 MED ORDER — HEPARIN (PORCINE) IN NACL 2-0.9 UNIT/ML-% IJ SOLN
INTRAMUSCULAR | Status: AC | PRN
  Administered 2017-05-29: 500 mL

## 2017-05-29 MED ORDER — TIOTROPIUM BROMIDE MONOHYDRATE 18 MCG IN CAPS
18.0000 ug | ORAL_CAPSULE | Freq: Every day | RESPIRATORY_TRACT | Status: DC
  Administered 2017-05-30: 18 ug via RESPIRATORY_TRACT
  Filled 2017-05-29: qty 5

## 2017-05-29 MED ORDER — FENTANYL CITRATE (PF) 100 MCG/2ML IJ SOLN
INTRAMUSCULAR | Status: DC | PRN
  Administered 2017-05-29: 25 ug via INTRAVENOUS
  Administered 2017-05-29 (×2): 12.5 ug via INTRAVENOUS
  Administered 2017-05-29 (×2): 25 ug via INTRAVENOUS

## 2017-05-29 MED ORDER — ONDANSETRON HCL 4 MG/2ML IJ SOLN: 4.0000 mg | Freq: Four times a day (QID) | INTRAMUSCULAR | Status: DC | PRN

## 2017-05-29 MED ORDER — CEFAZOLIN SODIUM-DEXTROSE 2-4 GM/100ML-% IV SOLN
INTRAVENOUS | Status: AC
  Filled 2017-05-29: qty 100

## 2017-05-29 MED ORDER — SODIUM CHLORIDE 0.9 % IR SOLN
Status: AC
  Filled 2017-05-29: qty 2

## 2017-05-29 MED ORDER — TAMSULOSIN HCL 0.4 MG PO CAPS
0.4000 mg | ORAL_CAPSULE | Freq: Every day | ORAL | Status: DC
  Administered 2017-05-29: 0.4 mg via ORAL
  Filled 2017-05-29: qty 1

## 2017-05-29 MED ORDER — SODIUM CHLORIDE 0.9 % IV SOLN
INTRAVENOUS | Status: DC
  Administered 2017-05-29: 10:00:00 via INTRAVENOUS

## 2017-05-29 MED ORDER — SODIUM CHLORIDE 0.9 % IV SOLN: 250.0000 mL | INTRAVENOUS | Status: DC | PRN

## 2017-05-29 MED ORDER — CHLORHEXIDINE GLUCONATE CLOTH 2 % EX PADS: 6.0000 | MEDICATED_PAD | Freq: Every day | CUTANEOUS | Status: DC

## 2017-05-29 MED ORDER — ORAL CARE MOUTH RINSE
15.0000 mL | Freq: Two times a day (BID) | OROMUCOSAL | Status: DC
  Administered 2017-05-30: 15 mL via OROMUCOSAL

## 2017-05-29 MED ORDER — CARVEDILOL 3.125 MG PO TABS
3.1250 mg | ORAL_TABLET | Freq: Two times a day (BID) | ORAL | Status: DC
  Administered 2017-05-29 – 2017-05-30 (×2): 3.125 mg via ORAL
  Filled 2017-05-29 (×2): qty 1

## 2017-05-29 MED ORDER — FENTANYL CITRATE (PF) 100 MCG/2ML IJ SOLN
INTRAMUSCULAR | Status: AC
  Filled 2017-05-29: qty 2

## 2017-05-29 MED ORDER — IOPAMIDOL (ISOVUE-370) INJECTION 76%
INTRAVENOUS | Status: AC
  Filled 2017-05-29: qty 50

## 2017-05-29 MED ORDER — MUPIROCIN 2 % EX OINT
TOPICAL_OINTMENT | CUTANEOUS | Status: AC
  Administered 2017-05-29: 1
  Filled 2017-05-29: qty 22

## 2017-05-29 MED ORDER — CHLORHEXIDINE GLUCONATE 4 % EX LIQD: 60.0000 mL | Freq: Once | CUTANEOUS | Status: DC

## 2017-05-29 MED ORDER — CARVEDILOL 3.125 MG PO TABS: 3.1250 mg | ORAL_TABLET | Freq: Two times a day (BID) | ORAL | Status: DC

## 2017-05-29 MED ORDER — SODIUM CHLORIDE 0.9% FLUSH: 3.0000 mL | INTRAVENOUS | Status: DC | PRN

## 2017-05-29 MED ORDER — LIDOCAINE HCL (PF) 1 % IJ SOLN
INTRAMUSCULAR | Status: DC | PRN
  Administered 2017-05-29: 45 mL

## 2017-05-29 MED ORDER — LIDOCAINE HCL (PF) 1 % IJ SOLN
INTRAMUSCULAR | Status: AC
  Filled 2017-05-29: qty 60

## 2017-05-29 MED ORDER — LOSARTAN POTASSIUM 25 MG PO TABS
25.0000 mg | ORAL_TABLET | Freq: Every day | ORAL | Status: DC
  Administered 2017-05-30: 25 mg via ORAL
  Filled 2017-05-29: qty 1

## 2017-05-29 MED ORDER — MIDAZOLAM HCL 5 MG/5ML IJ SOLN
INTRAMUSCULAR | Status: DC | PRN
  Administered 2017-05-29: 1 mg via INTRAVENOUS
  Administered 2017-05-29 (×2): 2 mg via INTRAVENOUS
  Administered 2017-05-29: 1 mg via INTRAVENOUS
  Administered 2017-05-29: 2 mg via INTRAVENOUS
  Administered 2017-05-29 (×2): 1 mg via INTRAVENOUS

## 2017-05-29 MED ORDER — CEFAZOLIN SODIUM-DEXTROSE 1-4 GM/50ML-% IV SOLN
1.0000 g | Freq: Four times a day (QID) | INTRAVENOUS | Status: AC
  Administered 2017-05-29 – 2017-05-30 (×3): 1 g via INTRAVENOUS
  Filled 2017-05-29 (×4): qty 50

## 2017-05-29 MED ORDER — SODIUM CHLORIDE 0.9 % IR SOLN
80.0000 mg | Status: AC
  Administered 2017-05-29: 80 mg

## 2017-05-29 MED ORDER — MIDAZOLAM HCL 5 MG/5ML IJ SOLN
INTRAMUSCULAR | Status: AC
  Filled 2017-05-29: qty 5

## 2017-05-29 MED ORDER — SPIRONOLACTONE 25 MG PO TABS
25.0000 mg | ORAL_TABLET | Freq: Every day | ORAL | Status: DC
  Administered 2017-05-30: 25 mg via ORAL
  Filled 2017-05-29: qty 1

## 2017-05-29 MED ORDER — IVABRADINE HCL 5 MG PO TABS
5.0000 mg | ORAL_TABLET | Freq: Two times a day (BID) | ORAL | Status: DC
  Administered 2017-05-29 – 2017-05-30 (×2): 5 mg via ORAL
  Filled 2017-05-29 (×2): qty 1

## 2017-05-29 MED ORDER — SODIUM CHLORIDE 0.9% FLUSH
3.0000 mL | Freq: Two times a day (BID) | INTRAVENOUS | Status: DC
  Administered 2017-05-29: 3 mL via INTRAVENOUS

## 2017-05-29 MED ORDER — MUPIROCIN 2 % EX OINT
1.0000 "application " | TOPICAL_OINTMENT | Freq: Two times a day (BID) | CUTANEOUS | Status: DC
  Administered 2017-05-29 – 2017-05-30 (×2): 1 via NASAL
  Filled 2017-05-29: qty 22

## 2017-05-29 MED ORDER — IOPAMIDOL (ISOVUE-370) INJECTION 76%
INTRAVENOUS | Status: DC | PRN
  Administered 2017-05-29: 10 mL via INTRAVENOUS
  Administered 2017-05-29: 15 mL via INTRAVENOUS

## 2017-05-29 MED ORDER — IVABRADINE HCL 5 MG PO TABS
5.0000 mg | ORAL_TABLET | Freq: Two times a day (BID) | ORAL | Status: DC
  Filled 2017-05-29: qty 1

## 2017-05-29 MED ORDER — CEFAZOLIN SODIUM-DEXTROSE 2-4 GM/100ML-% IV SOLN
2.0000 g | INTRAVENOUS | Status: AC
  Administered 2017-05-29: 2 g via INTRAVENOUS

## 2017-05-29 MED ORDER — MOMETASONE FURO-FORMOTEROL FUM 200-5 MCG/ACT IN AERO
2.0000 | INHALATION_SPRAY | Freq: Two times a day (BID) | RESPIRATORY_TRACT | Status: DC
  Administered 2017-05-29 – 2017-05-30 (×2): 2 via RESPIRATORY_TRACT
  Filled 2017-05-29: qty 8.8

## 2017-05-29 MED ORDER — HYDROCODONE-ACETAMINOPHEN 5-325 MG PO TABS: 1.0000 | ORAL_TABLET | ORAL | Status: DC | PRN

## 2017-05-29 MED ORDER — TRAMADOL HCL 50 MG PO TABS: 50.0000 mg | ORAL_TABLET | Freq: Three times a day (TID) | ORAL | Status: DC | PRN

## 2017-05-29 MED ORDER — ACETAMINOPHEN 325 MG PO TABS
325.0000 mg | ORAL_TABLET | ORAL | Status: DC | PRN
  Administered 2017-05-29 – 2017-05-30 (×2): 650 mg via ORAL
  Filled 2017-05-29 (×2): qty 2

## 2017-05-29 SURGICAL SUPPLY — 19 items
ADAPTER SEALING SSA-EW-09 (MISCELLANEOUS) ×3 IMPLANT
ASSURA CRTD CD3369-40Q (ICD Generator) ×3 IMPLANT
CABLE SURGICAL S-101-97-12 (CABLE) ×3 IMPLANT
CATH ATTAIN COM SURV 6250V-MB2 (CATHETERS) ×3 IMPLANT
CATH HEXAPOLAR DAMATO 6F (CATHETERS) ×3 IMPLANT
DEFIB ASSURA MULTI-CHMBR CRT-D (ICD Generator) ×1 IMPLANT
KIT ESSENTIALS PG (KITS) ×3 IMPLANT
LEAD DURATA 7122Q-65CM (Lead) ×3 IMPLANT
LEAD QUARTET 1456Q-86 (Lead) ×1 IMPLANT
LEAD TENDRIL MRI 52CM LPA1200M (Lead) ×3 IMPLANT
NEEDLE PERC 18GX7CM (NEEDLE) ×3 IMPLANT
PAD DEFIB LIFELINK (PAD) ×3 IMPLANT
QUARTET 1456Q-86 (Lead) ×3 IMPLANT
SHEATH CLASSIC 7F (SHEATH) ×3 IMPLANT
SHEATH CLASSIC 8F (SHEATH) ×3 IMPLANT
SHEATH CLASSIC 9F (SHEATH) ×3 IMPLANT
SLITTER 6232ADJ (MISCELLANEOUS) ×3 IMPLANT
TRAY PACEMAKER INSERTION (PACKS) ×3 IMPLANT
WIRE ACUITY WHISPER EDS 4648 (WIRE) ×3 IMPLANT

## 2017-05-29 NOTE — Interval H&P Note (Signed)
History and Physical Interval Note:  05/29/2017 11:28 AM  Benjamin Frost  has presented today for surgery, with the diagnosis of cardiac arrest  The various methods of treatment have been discussed with the patient and family. After consideration of risks, benefits and other options for treatment, the patient has consented to  Procedure(s): BIV ICD INSERTION CRT-D (N/A) as a surgical intervention .  The patient's history has been reviewed, patient examined, no change in status, stable for surgery.  I have reviewed the patient's chart and labs.  Questions were answered to the patient's satisfaction.    ICD Criteria  Current LVEF:25%. Within 12 months prior to implant: Yes   Heart failure history: Yes, Class III  Cardiomyopathy history: Yes, Non-Ischemic Cardiomyopathy.  Atrial Fibrillation/Atrial Flutter: No.  Ventricular tachycardia history: Yes, Hemodynamic instability present. 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  Cardiac arrest history: Yes, Ventricular Fibrillation.  History of syndromes with risk of sudden death: No.  Previous ICD: No.  Current ICD indication: Secondary  PPM indication:  Yes, biv ICD  Beta Blocker therapy for 3 or more months: Yes, prescribed.   Ace Inhibitor/ARB therapy for 3 or more months: Yes, prescribed.       Hillis Range

## 2017-05-29 NOTE — H&P (View-Only) (Signed)
ELECTROPHYSIOLOGY CONSULT NOTE    Primary Care Physician: Rory Percy, MD Referring Physician:  Dr Excell Seltzer  Admit Date: 05/29/2017  Reason for consultation:  Ventricular fibrillation arrest  Benjamin Frost is a 76 y.o. male with a h/o nonischemic cardiomyopathy, LBBB, and aortic stenosis.  He is referred by Dr Excell Seltzer for further risk stratification after VF arrest.  The patient has a longstanding nonischemic CM.  He has NYHA Class II/III CHF.  He has been treated with an optimal medical therapy.  He presented 05/15/17 for dobutamine stress testing to evaluate his aortic stenosis.  He had VF arrest during the study requiring CPR and defibrillation by Dr End.  He was admitted and underwent cath (reviewed) which revealed no obstructive CAD amenable to revascularization.  No reversible causes for his VF were identified.  His AS is felt to be moderate and not the cause of his arrest per Dr Excell Seltzer.  He was discharged with lifevest to follow-up with me electively.   Today, he denies symptoms of palpitations, exertional chest pain, shortness of breath, orthopnea, PND, lower extremity edema, dizziness, presyncope, syncope, or neurologic sequela. The patient is tolerating medications without difficulties and is otherwise without complaint today.   Past Medical History:  Diagnosis Date  . Aortic stenosis    a. mod-severe low flow low gradient AS.   Marland Kitchen Chronic back pain   . Chronic systolic heart failure (HCC)    Ejection fraction of 15%  . COPD (chronic obstructive pulmonary disease) (HCC)   . History of BPH   . Hyperlipidemia   . Hypertension   . Kidney stone   . Left bundle branch block   . Sinus tachycardia    Past Surgical History:  Procedure Laterality Date  . CARDIAC CATHETERIZATION  2012   [facility]  . CARDIAC CATHETERIZATION     [facility]  . LEFT AND RIGHT HEART CATHETERIZATION WITH CORONARY ANGIOGRAM N/A 03/23/2014   Procedure: LEFT AND RIGHT HEART CATHETERIZATION WITH CORONARY ANGIOGRAM;   Surgeon: [name], MD;  Location: [facility] CATH [facility];  Service: Cardiovascular;  Laterality: N/A;  . RIGHT/LEFT HEART CATH AND CORONARY ANGIOGRAPHY N/A 05/16/2017   Procedure: RIGHT/LEFT HEART CATH AND CORONARY ANGIOGRAPHY;  Surgeon: [name], MD;  Location: [facility] INVASIVE CV LAB;  Service: Cardiovascular;  Laterality: N/A;    . chlorhexidine  60 mL Topical Once  . gentamicin irrigation  80 mg Irrigation On Call   . sodium chloride 50 mL/hr at 05/29/17 1023  .  ceFAZolin (ANCEF) IV      Allergies  Allergen Reactions  . Amlodipine Other (See Comments)    unknown  . Lisinopril Hives       . Metoprolol Nausea And Vomiting    Nausea and vomiting     Social History   Social History  . Marital status: Married    Spouse name: N/A  . Number of children: N/A  . Years of education: N/A   Occupational History  . Not on file.   Social History Main Topics  . Smoking status: Former Smoker    Years: 0.00    Types: Cigarettes  . Smokeless tobacco: Former Neurosurgeon    Types: Snuff  . Alcohol use Yes     Comment: occasional  . Drug use: No  . Sexual activity: Not on file   Other Topics Concern  . Not on file   Social History Narrative  . No narrative on file    Family History  Problem Relation Age of Onset  .  Heart disease Father   . Heart failure Father   . Hypertension Mother   . Hyperlipidemia Mother     ROS- All systems are reviewed and negative except as per the HPI above  Physical Exam:  Vitals:   05/29/17 0830  BP: 137/81  Pulse: 74  Temp: 97.9 F (36.6 C)  TempSrc: Oral  SpO2: 96%  Weight: 195 lb (88.5 kg)  Height:  (1.778 m)    GEN- The patient is well appearing, alert and oriented x 3 today.   Head- normocephalic, atraumatic Eyes-  Sclera clear, conjunctiva pink Ears- hearing intact Oropharynx- clear Neck- supple, no JVP Lymph- no cervical lymphadenopathy Lungs- Clear to ausculation bilaterally, normal work of breathing Heart- Regular  rate and rhythm, 2/6 SEM LUSB which is late peaking GI- soft, NT, ND, + BS Extremities- no clubbing, cyanosis, or edema MS- no significant deformity or atrophy Skin- no rash or lesion Psych- euthymic mood, full affect Neuro- strength and sensation are intact  EKG 05/16/17 reveals sinus rhythm with LBBB (QRS > 150 msec)  Labs:   Lab Results  Component Value Date   WBC 7.7 05/29/2017   HGB 13.8 05/29/2017   HCT 40.9 05/29/2017   MCV 93.2 05/29/2017   PLT 142 (L) 05/29/2017    Recent Labs Lab 05/29/17 1015  NA 139  K 4.2  CL 103  CO2 28  BUN 12  CREATININE 0.86  CALCIUM 9.5  GLUCOSE 114*   Lab Results  Component Value Date   CKTOTAL 52 03/12/2014   CKMB 2.3 03/12/2014   TROPONINI <0.03 05/15/2017    Lab Results  Component Value Date   CHOL 117 02/28/2014   Lab Results  Component Value Date   HDL 44 02/28/2014   Lab Results  Component Value Date   LDLCALC 60 02/28/2014   Lab Results  Component Value Date   TRIG 65 02/28/2014   No results found for: CHOLHDL No results found for: LDLDIRECT     Echo: 04/14/17 reveals EF 25%  ASSESSMENT AND PLAN:   The patient has a nonischemic CM (EF 25%), NYHA Class II/III CHF, and LBBB.  He has been resuscitated s/p VF arrest requiring defibrillation.  No reversible causes have been found.  At this time, he meets criteria for ICD implantation for secondary prevention of sudden death. Given LBBB with QRS > 150 msec, he also meets criteria for CRT.  Risks, benefits, alternatives to BIV ICD implantation were discussed in detail with the patient today. The patient  understands that the risks include but are not limited to bleeding, infection, pneumothorax, perforation, tamponade, vascular damage, renal failure, MI, stroke, death, inappropriate shocks, and lead dislodgement and wishes to proceed.  We will therefore schedule device implantation at this time.   Hillis Range, MD 05/29/2017  11:21 AM

## 2017-05-29 NOTE — Consult Note (Signed)
ELECTROPHYSIOLOGY CONSULT NOTE    Primary Care Physician: Rory Percy, MD Referring Physician:  Dr Excell Seltzer  Admit Date: 05/29/2017  Reason for consultation:  Ventricular fibrillation arrest  Benjamin Frost is a 76 y.o. male with a h/o nonischemic cardiomyopathy, LBBB, and aortic stenosis.  He is referred by Dr Excell Seltzer for further risk stratification after VF arrest.  The patient has a longstanding nonischemic CM.  He has NYHA Class II/III CHF.  He has been treated with an optimal medical therapy.  He presented 05/15/17 for dobutamine stress testing to evaluate his aortic stenosis.  He had VF arrest during the study requiring CPR and defibrillation by Dr End.  He was admitted and underwent cath (reviewed) which revealed no obstructive CAD amenable to revascularization.  No reversible causes for his VF were identified.  His AS is felt to be moderate and not the cause of his arrest per Dr Excell Seltzer.  He was discharged with lifevest to follow-up with me electively.   Today, he denies symptoms of palpitations, exertional chest pain, shortness of breath, orthopnea, PND, lower extremity edema, dizziness, presyncope, syncope, or neurologic sequela. The patient is tolerating medications without difficulties and is otherwise without complaint today.   Past Medical History:  Diagnosis Date  . Aortic stenosis    a. mod-severe low flow low gradient AS.   Marland Kitchen Chronic back pain   . Chronic systolic heart failure (HCC)    Ejection fraction of 15%  . COPD (chronic obstructive pulmonary disease) (HCC)   . History of BPH   . Hyperlipidemia   . Hypertension   . Kidney stone   . Left bundle branch block   . Sinus tachycardia    Past Surgical History:  Procedure Laterality Date  . CARDIAC CATHETERIZATION  2012   [facility]  . CARD[facility]AC CATHETERIZATION     [facility]  . LEFT AND RIGHT HEART CATHETERIZATION WITH CORONARY ANGIOGRAM N/A 03/23/2014   Procedure: LEFT AND RIGHT HEART CATHETERIZATION WITH CORONARY ANGIOGRAM;   Surgeon: [name], MD;  Location: [facility]ATH [facility];  Service: Cardiovascular;  Laterality: N/A;  . RIGHT/LEFT HEART CATH AND CORONARY ANGIOGRAPHY N/A 05/16/2017   Procedure: RIGHT/LEFT HEART CATH AND CORONARY ANGIOGRAPHY;  Surgeon: [name], MD;  Location: [facility] INVAS[facility] CV [facility];  Service: Cardiovascular;  Laterality: N/A;    . chlorhexidine  60 mL Topical Once  . gentamicin irrigation  80 mg Irrigation On Call   . sodium chloride 50 mL/hr at 05/29/17 1023  .  ceFAZolin (ANCEF) IV      Allergies  Allergen Reactions  . Amlodipine Other (See Comments)    unknown  . Lisinopril Hives       . Metoprolol Nausea And Vomiting    Nausea and vomiting     Social History   Social History  . Marital status: Married    Spouse name: N/A  . Number of children: N/A  . Years of education: N/A   Occupational History  . Not on file.   Social History Main Topics  . Smoking status: Former Smoker    Years: 0.00    Types: Cigarettes  . Smokeless tobacco: Former Neurosurgeon    Types: Snuff  . Alcohol use Yes     Comment: occasional  . Drug use: No  . Sexual activity: Not on file   Other Topics Concern  . Not on file   Social History Narrative  . No narrative on file    Family History  Problem Relation Age of Onset  .  Heart disease Father   . Heart failure Father   . Hypertension Mother   . Hyperlipidemia Mother     ROS- All systems are reviewed and negative except as per the HPI above  Physical Exam:  Vitals:   05/29/17 0830  BP: 137/81  Pulse: 74  Temp: 97.9 F (36.6 C)  TempSrc: Oral  SpO2: 96%  Weight: 195 lb (88.5 kg)  Height: 5' 10" (1.778 m)    GEN- The patient is well appearing, alert and oriented x 3 today.   Head- normocephalic, atraumatic Eyes-  Sclera clear, conjunctiva pink Ears- hearing intact Oropharynx- clear Neck- supple, no JVP Lymph- no cervical lymphadenopathy Lungs- Clear to ausculation bilaterally, normal work of breathing Heart- Regular  rate and rhythm, 2/6 SEM LUSB which is late peaking GI- soft, NT, ND, + BS Extremities- no clubbing, cyanosis, or edema MS- no significant deformity or atrophy Skin- no rash or lesion Psych- euthymic mood, full affect Neuro- strength and sensation are intact  EKG 05/16/17 reveals sinus rhythm with LBBB (QRS > 150 msec)  Labs:   Lab Results  Component Value Date   WBC 7.7 05/29/2017   HGB 13.8 05/29/2017   HCT 40.9 05/29/2017   MCV 93.2 05/29/2017   PLT 142 (L) 05/29/2017    Recent Labs Lab 05/29/17 1015  NA 139  K 4.2  CL 103  CO2 28  BUN 12  CREATININE 0.86  CALCIUM 9.5  GLUCOSE 114*   Lab Results  Component Value Date   CKTOTAL 52 03/12/2014   CKMB 2.3 03/12/2014   TROPONINI <0.03 05/15/2017    Lab Results  Component Value Date   CHOL 117 02/28/2014   Lab Results  Component Value Date   HDL 44 02/28/2014   Lab Results  Component Value Date   LDLCALC 60 02/28/2014   Lab Results  Component Value Date   TRIG 65 02/28/2014   No results found for: CHOLHDL No results found for: LDLDIRECT     Echo: 04/14/17 reveals EF 25%  ASSESSMENT AND PLAN:   The patient has a nonischemic CM (EF 25%), NYHA Class II/III CHF, and LBBB.  He has been resuscitated s/p VF arrest requiring defibrillation.  No reversible causes have been found.  At this time, he meets criteria for ICD implantation for secondary prevention of sudden death. Given LBBB with QRS > 150 msec, he also meets criteria for CRT.  Risks, benefits, alternatives to BIV ICD implantation were discussed in detail with the patient today. The patient  understands that the risks include but are not limited to bleeding, infection, pneumothorax, perforation, tamponade, vascular damage, renal failure, MI, stroke, death, inappropriate shocks, and lead dislodgement and wishes to proceed.  We will therefore schedule device implantation at this time.   Asad Keeven, MD 05/29/2017  11:21 AM   

## 2017-05-30 ENCOUNTER — Ambulatory Visit (HOSPITAL_COMMUNITY): Payer: Medicare Other

## 2017-05-30 ENCOUNTER — Encounter (HOSPITAL_COMMUNITY): Payer: Self-pay | Admitting: Internal Medicine

## 2017-05-30 DIAGNOSIS — I4901 Ventricular fibrillation: Secondary | ICD-10-CM

## 2017-05-30 DIAGNOSIS — I447 Left bundle-branch block, unspecified: Secondary | ICD-10-CM

## 2017-05-30 DIAGNOSIS — I462 Cardiac arrest due to underlying cardiac condition: Secondary | ICD-10-CM | POA: Diagnosis not present

## 2017-05-30 DIAGNOSIS — I428 Other cardiomyopathies: Secondary | ICD-10-CM | POA: Diagnosis not present

## 2017-05-30 LAB — BASIC METABOLIC PANEL
ANION GAP: 8 (ref 5–15)
BUN: 12 mg/dL (ref 6–20)
CALCIUM: 8.8 mg/dL — AB (ref 8.9–10.3)
CO2: 27 mmol/L (ref 22–32)
Chloride: 105 mmol/L (ref 101–111)
Creatinine, Ser: 0.78 mg/dL (ref 0.61–1.24)
Glucose, Bld: 89 mg/dL (ref 65–99)
Potassium: 3.9 mmol/L (ref 3.5–5.1)
SODIUM: 140 mmol/L (ref 135–145)

## 2017-05-30 MED FILL — Midazolam HCl Inj 5 MG/5ML (Base Equivalent): INTRAMUSCULAR | Qty: 5 | Status: AC

## 2017-05-30 NOTE — Discharge Instructions (Signed)
° ° °  Supplemental Discharge Instructions for   Defibrillator Patients  Activity No heavy lifting or vigorous activity with your left/right arm for 6 to 8 weeks.  Do not raise your left/right arm above your head for one week.  Gradually raise your affected arm.   NO DRIVING for 6 months. DMV advises after a life threatening arrhythmia that you not drive for 6 months.    WOUND CARE - Keep the wound area clean and dry.  Do not get this area wet for one week. No showers for 10 days. The tape/steri-strips on your wound will fall off; do not pull them off.  No bandage is needed on the site.  DO  NOT apply any creams, oils, or ointments to the wound area. - If you notice any drainage or discharge from the wound, any swelling or bruising at the site, or you develop a fever > 101? F after you are discharged home, call the office at once.  Special Instructions - You are still able to use cellular telephones; use the ear opposite the side where you have your pacemaker/defibrillator.  Avoid carrying your cellular phone near your device. - When traveling through airports, show security personnel your identification card to avoid being screened in the metal detectors.  Ask the security personnel to use the hand wand. - Avoid arc welding equipment, MRI testing (magnetic resonance imaging), TENS units (transcutaneous nerve stimulators).  Call the office for questions about other devices. - Avoid electrical appliances that are in poor condition or are not properly grounded. - Microwave ovens are safe to be near or to operate.  Additional information for defibrillator patients should your device go off: - If your device goes off ONCE and you feel fine afterward, notify the device clinic nurses. - If your device goes off ONCE and you do not feel well afterward, call 911. - If your device goes off TWICE, call 911. - If your device goes off THREE times in one day, call 911.  DO NOT DRIVE YOURSELF OR A FAMILY  MEMBER WITH A DEFIBRILLATOR TO THE HOSPITAL--CALL 911.

## 2017-05-30 NOTE — Plan of Care (Signed)
Problem: Education: Goal: Knowledge of [facility] Education information/materials will improve Outcome: Completed/Met Date Met: 05/30/17 Pt educated on the importance hand hygiene & infection. The importance of site care & arm restrictions. Pt educated on signs of infection.

## 2017-05-30 NOTE — Plan of Care (Signed)
Problem: Physical Regulation: Goal: Ability to maintain clinical measurements within normal limits will improve Outcome: Adequate for Discharge Pt educated on site care & arm restrictions. Pt also educated on s/s of infection at the site. Verified via teachback.

## 2017-05-30 NOTE — Discharge Summary (Signed)
ELECTROPHYSIOLOGY PROCEDURE DISCHARGE SUMMARY    Patient ID: Benjamin Frost,  MRN: [id number], DOB/AGE: 10-12-1940 76 y.o.  Admit date: 05/29/2017 Discharge date: 05/30/2017  Primary Care Physician: Rogue Jury Primary Cardiologist: Dr Kirke Corin    Primary Discharge Diagnosis:  1. Ventricular fibrillation arrest  Secondary Discharge Diagnosis:  1. Nonischemic cardiomyopathy 2. LBBB  Allergies  Allergen Reactions  . Amlodipine Other (See Comments)    unknown  . Lisinopril Hives       . Metoprolol Nausea And Vomiting    Nausea and vomiting      Procedures This Admission:  1.  Implantation of a [employer] Jude Medical BiV ICD on 05/29/17 by Dr Johney Frame.   DFT's were deferred at time of implant/successful at 10 J.  There were no immediate post procedure complications. 2.  CXR on 05/30/17 demonstrated no pneumothorax status post device implantation.   Brief HPI: Benjamin Frost is a 76 y.o. male was referred to electrophysiology in the outpatient setting for consideration of ICD implantation.  Past medical history includes chronic nonischemic CM, LBBB, and recent VF arrest.  The patient has persistent LV dysfunction despite guideline directed therapy.  Risks, benefits, and alternatives to ICD implantation were reviewed with the patient who wished to proceed.   Hospital Course:  The patient was admitted and underwent implantation of a BiV iCD with details as outlined above. He was monitored on telemetry overnight which demonstrated sinus rhythm with V pacing.  Left chest was without hematoma or ecchymosis.  The device was interrogated and found to be functioning normally, though LV lead position had changed slightly and threshold was 2.5V @ 1 msec at best with m2 position unipolar.  CXR was obtained and demonstrated no pneumothorax status post device implantation.  Wound care, arm mobility, and restrictions were reviewed with the patient.  The patient was examined and considered stable for  discharge to home.   The patient's discharge medications include an ARB and beta blocker.   Physical Exam: Vitals:   05/29/17 1948 05/29/17 2120 05/29/17 2300 05/30/17 0610  BP: 109/72 114/82 121/77 112/68  Pulse: (!) 118 (!) 101 86 82  Resp: Temp: 98.3 F (36.8 C)   97.6 F (36.4 C)  TempSrc: Axillary   Oral  SpO2: 98% 95% 98% 98%  Weight:    195 lb 6.4 oz (88.6 kg)  Height:        GEN- The patient is well appearing, alert and oriented x 3 today.   HEENT: normocephalic, atraumatic; sclera clear, conjunctiva pink; hearing intact; oropharynx clear; neck supple, no JVP Lymph- no cervical lymphadenopathy Lungs- Clear to ausculation bilaterally, normal work of breathing.  No wheezes, rales, rhonchi Heart- Regular rate and rhythm, no murmurs, rubs or gallops, PMI not laterally displaced GI- soft, non-tender, non-distended, bowel sounds present, no hepatosplenomegaly Extremities- no clubbing, cyanosis, or edema; DP/PT/radial pulses 2+ bilaterally MS- no significant deformity or atrophy Skin- warm and dry, no rash or lesion, left chest without hematoma/ecchymosis Psych- euthymic mood, full affect Neuro- strength and sensation are intact   Labs:   Lab Results  Component Value Date   WBC 7.7 05/29/2017   HGB 13.8 05/29/2017   HCT 40.9 05/29/2017   MCV 93.2 05/29/2017   PLT 142 (L) 05/29/2017    Recent Labs Lab 05/30/17 0322  NA 140  K 3.9  CL 105  CO2 27  BUN 12  CREATININE 0.78  CALCIUM 8.8*  GLUCOSE 89    Discharge Medications:  Allergies as of 05/30/2017      Reactions   Amlodipine Other (See Comments)   unknown   Lisinopril Hives      Metoprolol Nausea And Vomiting   Nausea and vomiting      Medication List    TAKE these medications   acetaminophen 500 MG tablet Commonly known as:  TYLENOL Take 500 mg by mouth daily as needed for headache.   aspirin 81 MG tablet Take 81 mg by mouth daily.   atorvastatin 40 MG tablet Commonly known as:   LIPITOR Take 40 mg by mouth daily.   carvedilol 3.125 MG tablet Commonly known as:  COREG Take 1 tablet (3.125 mg total) by mouth 2 (two) times daily.   digoxin 0.125 MG tablet Commonly known as:  LANOXIN Take 1 tablet (0.125 mg total) by mouth daily.   Fluticasone-Salmeterol 250-50 MCG/DOSE Aepb Commonly known as:  ADVAIR Inhale 1 puff into the lungs 2 (two) times daily.   ivabradine 5 MG Tabs tablet Commonly known as:  CORLANOR Take 1 tablet (5 mg total) by mouth 2 (two) times daily with a meal.   losartan 25 MG tablet Commonly known as:  COZAAR Take 1 tablet (25 mg total) by mouth daily.   omeprazole 20 MG capsule Commonly known as:  PRILOSEC Take 20 mg by mouth daily.   PROAIR HFA 108 (90 Base) MCG/ACT inhaler Generic drug:  albuterol Inhale 2 puffs into the lungs every 6 (six) hours as needed for wheezing or shortness of breath.   spironolactone 25 MG tablet Commonly known as:  ALDACTONE Take 1 tablet (25 mg total) by mouth daily.   tamsulosin 0.4 MG Caps capsule Commonly known as:  FLOMAX Take 0.4 mg by mouth daily.   tiotropium 18 MCG inhalation capsule Commonly known as:  SPIRIVA Place 18 mcg into inhaler and inhale daily.   traMADol 50 MG tablet Commonly known as:  ULTRAM Take 1 tablet (50 mg total) by mouth 3 (three) times daily as needed for moderate pain.   vitamin C 500 MG tablet Commonly known as:  ASCORBIC ACID Take 1,000 mg by mouth daily.       Disposition:   Follow-up Information    [facility] Follow up on 06/09/2017.   Specialty:  Cardiology Why:  2 pm Contact information: [address] [place] [address] [phone]          Duration of Discharge Encounter: Greater than 30 minutes including physician time.  Randolm Idol MD 05/30/2017 8:00 AM

## 2017-05-30 NOTE — Plan of Care (Signed)
Problem: Activity: Goal: Risk for activity intolerance will decrease Outcome: Adequate for Discharge Pt ambulates well. Pt & dgtr at bedside educated on restrictions post pacer.

## 2017-05-30 NOTE — Plan of Care (Signed)
Problem: Education: Goal: Knowledge of [facility] Education information/materials will improve Outcome: Progressing Patient aware of plan of care.  RN and patient reviewed all medications given, prior to RN administration.  Patient stated understanding.

## 2017-05-30 NOTE — Plan of Care (Signed)
Problem: Pain Managment: Goal: General experience of comfort will improve Outcome: Progressing Patient did complain of surgical site pain (see flowsheets for detailed pain assessment).  RN administered PRN Tylenol (see MAR for details).  Patient sleeping at RN reassessment.

## 2017-05-30 NOTE — Plan of Care (Signed)
Problem: Safety: Goal: Ability to remain free from injury will improve Outcome: Progressing RN instructed patient to call and wait for staff assistance prior to getting out of bed.  Patient stated understanding and has made no attempts to get out of bed unassisted.     

## 2017-06-03 ENCOUNTER — Telehealth: Payer: Self-pay | Admitting: Internal Medicine

## 2017-06-03 NOTE — Telephone Encounter (Signed)
Pt reports noticing some bruising on "left inside bicep", denies pain/tenderness/redness.   Also reports slight swelling to that arm, watch is slightly tight on wrist. States he only noticed this today. Informed patient to continue to monitor and I don't think this is a concern, but advised I will forward to device clinic triage and Dr. Jenel Lucks nurse to follow up with patient tomorrow to determine if sooner appt/further testing is needed. Pt agreeable to plan.

## 2017-06-03 NOTE — Telephone Encounter (Signed)
New message     Had pacemaker installed last Thursday he is having bruising in left arm

## 2017-06-04 NOTE — Telephone Encounter (Signed)
Spoke with pt, informed him that some swelling on the side of the pacemaker implant is not not that uncommom, informed pt to keep arm elevated about on pillow about chest height while sitting or lying and to call the office back if the swelling increasing. Pt voiced understanding.

## 2017-06-11 ENCOUNTER — Ambulatory Visit (INDEPENDENT_AMBULATORY_CARE_PROVIDER_SITE_OTHER): Payer: Medicare Other | Admitting: *Deleted

## 2017-06-11 DIAGNOSIS — I5022 Chronic systolic (congestive) heart failure: Secondary | ICD-10-CM

## 2017-06-11 DIAGNOSIS — I447 Left bundle-branch block, unspecified: Secondary | ICD-10-CM | POA: Diagnosis not present

## 2017-06-11 DIAGNOSIS — I428 Other cardiomyopathies: Secondary | ICD-10-CM | POA: Diagnosis not present

## 2017-06-11 LAB — CUP PACEART INCLINIC DEVICE CHECK
Battery Remaining Longevity: 43 mo
Brady Statistic RA Percent Paced: 15 %
Brady Statistic RV Percent Paced: 99 %
HighPow Impedance: 60.75 Ohm
Implantable Lead Implant Date: 20180906
Implantable Lead Location: 753859
Implantable Lead Location: 753860
Lead Channel Impedance Value: 475 Ohm
Lead Channel Impedance Value: 525 Ohm
Lead Channel Impedance Value: 550 Ohm
Lead Channel Pacing Threshold Amplitude: 0.5 V
Lead Channel Pacing Threshold Amplitude: 0.5 V
Lead Channel Pacing Threshold Amplitude: 0.75 V
Lead Channel Pacing Threshold Amplitude: 2 V
Lead Channel Pacing Threshold Pulse Width: 0.5 ms
Lead Channel Pacing Threshold Pulse Width: 0.5 ms
Lead Channel Pacing Threshold Pulse Width: 0.5 ms
Lead Channel Pacing Threshold Pulse Width: 1 ms
Lead Channel Sensing Intrinsic Amplitude: 2.7 mV
Lead Channel Setting Pacing Amplitude: 3.25 V
Lead Channel Setting Pacing Amplitude: 3.5 V
Lead Channel Setting Pacing Pulse Width: 0.5 ms
Lead Channel Setting Sensing Sensitivity: 0.5 mV
MDC IDC LEAD IMPLANT DT: 20180906
MDC IDC LEAD IMPLANT DT: 20180906
MDC IDC LEAD LOCATION: 753858
MDC IDC MSMT LEADCHNL LV PACING THRESHOLD AMPLITUDE: 2 V
MDC IDC MSMT LEADCHNL LV PACING THRESHOLD PULSEWIDTH: 1 ms
MDC IDC MSMT LEADCHNL RA PACING THRESHOLD AMPLITUDE: 0.75 V
MDC IDC MSMT LEADCHNL RV PACING THRESHOLD PULSEWIDTH: 0.5 ms
MDC IDC MSMT LEADCHNL RV SENSING INTR AMPL: 11 mV
MDC IDC PG IMPLANT DT: 20180906
MDC IDC PG SERIAL: 7421078
MDC IDC SESS DTM: 20180919145621
MDC IDC SET LEADCHNL LV PACING PULSEWIDTH: 1 ms
MDC IDC SET LEADCHNL RA PACING AMPLITUDE: 3.5 V

## 2017-06-11 NOTE — Progress Notes (Signed)
Wound check appointment. Steri-strips removed. Wound without redness or edema. Incision edges approximated, wound well healed. Normal device function. Thresholds, sensing, and impedances consistent with implant measurements. Device programmed at 3.5V(RA/RV) and with LV auto capture on for extra safety margin until 3 month visit. Histogram distribution appropriate for patient and level of activity. No mode switches or ventricular arrhythmias noted. Patient educated about wound care, arm mobility, lifting restrictions, shock plan. ROV in 3 months with JA.

## 2017-06-16 ENCOUNTER — Other Ambulatory Visit: Payer: Self-pay | Admitting: *Deleted

## 2017-06-16 MED ORDER — LOSARTAN POTASSIUM 25 MG PO TABS: 25.0000 mg | ORAL_TABLET | Freq: Every day | ORAL | 3 refills | Status: AC

## 2017-07-14 ENCOUNTER — Encounter: Payer: Self-pay | Admitting: Emergency Medicine

## 2017-07-14 ENCOUNTER — Emergency Department: Payer: Medicare Other

## 2017-07-14 ENCOUNTER — Emergency Department
Admission: EM | Admit: 2017-07-14 | Discharge: 2017-07-14 | Disposition: A | Discharge status: Home / Self Care | Payer: Medicare Other | Attending: Emergency Medicine | Admitting: Emergency Medicine

## 2017-07-14 DIAGNOSIS — R0602 Shortness of breath: Secondary | ICD-10-CM | POA: Diagnosis present

## 2017-07-14 DIAGNOSIS — I5022 Chronic systolic (congestive) heart failure: Secondary | ICD-10-CM | POA: Diagnosis not present

## 2017-07-14 DIAGNOSIS — E785 Hyperlipidemia, unspecified: Secondary | ICD-10-CM | POA: Insufficient documentation

## 2017-07-14 DIAGNOSIS — Z7982 Long term (current) use of aspirin: Secondary | ICD-10-CM | POA: Insufficient documentation

## 2017-07-14 DIAGNOSIS — J441 Chronic obstructive pulmonary disease with (acute) exacerbation: Secondary | ICD-10-CM | POA: Insufficient documentation

## 2017-07-14 DIAGNOSIS — Z87891 Personal history of nicotine dependence: Secondary | ICD-10-CM | POA: Insufficient documentation

## 2017-07-14 DIAGNOSIS — I110 Hypertensive heart disease with heart failure: Secondary | ICD-10-CM | POA: Diagnosis not present

## 2017-07-14 DIAGNOSIS — Z79899 Other long term (current) drug therapy: Secondary | ICD-10-CM | POA: Diagnosis not present

## 2017-07-14 DIAGNOSIS — I447 Left bundle-branch block, unspecified: Secondary | ICD-10-CM | POA: Insufficient documentation

## 2017-07-14 LAB — CBC
HEMATOCRIT: 40.9 % (ref 40.0–52.0)
HEMOGLOBIN: 14 g/dL (ref 13.0–18.0)
MCH: 32.1 pg (ref 26.0–34.0)
MCHC: 34.2 g/dL (ref 32.0–36.0)
MCV: 93.9 fL (ref 80.0–100.0)
Platelets: 136 10*3/uL — ABNORMAL LOW (ref 150–440)
RBC: 4.36 MIL/uL — ABNORMAL LOW (ref 4.40–5.90)
RDW: 13.5 % (ref 11.5–14.5)
WBC: 10 10*3/uL (ref 3.8–10.6)

## 2017-07-14 LAB — BASIC METABOLIC PANEL
ANION GAP: 10 (ref 5–15)
BUN: 14 mg/dL (ref 6–20)
CO2: 28 mmol/L (ref 22–32)
Calcium: 9.1 mg/dL (ref 8.9–10.3)
Chloride: 97 mmol/L — ABNORMAL LOW (ref 101–111)
Creatinine, Ser: 0.84 mg/dL (ref 0.61–1.24)
GFR calc Af Amer: >60 mL/min (ref >60)
Glucose, Bld: 97 mg/dL (ref 65–99)
POTASSIUM: 4.1 mmol/L (ref 3.5–5.1)
SODIUM: 135 mmol/L (ref 135–145)

## 2017-07-14 LAB — TROPONIN I: Troponin I: <0.03 ng/mL (ref <0.03)

## 2017-07-14 MED ORDER — PREDNISONE 20 MG PO TABS: 40.0000 mg | ORAL_TABLET | Freq: Every day | ORAL | 0 refills | Status: DC

## 2017-07-14 MED ORDER — PREDNISONE 20 MG PO TABS: 40.0000 mg | ORAL_TABLET | Freq: Once | ORAL | Status: DC

## 2017-07-14 MED ORDER — METHYLPREDNISOLONE SODIUM SUCC 125 MG IJ SOLR
125.0000 mg | Freq: Once | INTRAMUSCULAR | Status: AC
  Administered 2017-07-14: 125 mg via INTRAVENOUS
  Filled 2017-07-14: qty 2

## 2017-07-14 MED ORDER — IPRATROPIUM-ALBUTEROL 0.5-2.5 (3) MG/3ML IN SOLN
3.0000 mL | Freq: Once | RESPIRATORY_TRACT | Status: AC
  Administered 2017-07-14: 3 mL via RESPIRATORY_TRACT
  Filled 2017-07-14: qty 3

## 2017-07-14 NOTE — ED Notes (Signed)
Pt reports coughing up yellow phlegm.  Sx for 3 days   No fever.  Pt reports feeling sob and is on oxygen at night at home.  Pt denies chest pain.  Intermittent tightness in chest.  No n/v/d.  No diaphoresis.  Pt had pacemaker insertion 6 weeks ago at [facility] hospital .  Pt alert.  Family with pt.

## 2017-07-14 NOTE — Discharge Instructions (Signed)
Please seek medical attention for any high fevers, chest pain, shortness of breath, change in behavior, persistent vomiting, bloody stool or any other new or concerning symptoms.  

## 2017-07-14 NOTE — ED Provider Notes (Signed)
[facility] Emergency Department Provider Note   ____________________________________________   I have reviewed the triage vital signs and the nursing notes.   HISTORY  Chief Complaint Shortness of Breath   History limited by: Not Limited   HPI Benjamin Frost is a 76 y.o. male who presents to the emergency department today because of SOB  DURATION:2 days TIMING: constant SEVERITY: moderate CONTEXT: patient with history of COPD. No known sick contacts.  MODIFYING FACTORS: none ASSOCIATED SYMPTOMS: cough productive of yellow phlegm. No fever.  Per medical record review patient has a history of COPD.  Past Medical History:  Diagnosis Date  . Aortic stenosis    a. mod-severe low flow low gradient AS.   Marland Kitchen Chronic back pain   . Chronic systolic heart failure (HCC)    Ejection fraction of 15%  . COPD (chronic obstructive pulmonary disease) (HCC)   . History of BPH   . Hyperlipidemia   . Hypertension   . Kidney stone   . Left bundle branch block   . Sinus tachycardia     Patient Active Problem List   Diagnosis Date Noted  . Nonischemic cardiomyopathy (HCC)   . Hypertension   . 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 Moderate aortic stenosis 10/21/2014  . Left bundle branch block 03/22/2014  . Chronic systolic heart failure (HCC)   . Sinus tachycardia     Past Surgical History:  Procedure Laterality Date  . BIV ICD INSERTION CRT-D N/A 05/29/2017   Procedure: BIV ICD INSERTION CRT-D;  Surgeon: Hillis Range, MD;  Location: [facility] INVASIVE CV LAB;  Service: Cardiovascular;  Laterality: N/A;  . CARDIAC CATHETERIZATION  2012   [facility]  . CARDIAC CATHETERIZATION     [facility]  . LEFT AND RIGHT HEART CATHETERIZATION WITH CORONARY ANGIOGRAM N/A 03/23/2014   Procedure: LEFT AND RIGHT HEART CATHETERIZATION WITH CORONARY ANGIOGRAM;  Surgeon: [name], MD;  Location: [facility] CATH LAB;  Service: Cardiovascular;  Laterality: N/A;  . RIGHT/LEFT HEART CATH AND  CORONARY ANGIOGRAPHY N/A 05/16/2017   Procedure: RIGHT/LEFT HEART CATH AND CORONARY ANGIOGRAPHY;  Surgeon: [name], MD;  Location: [facility] INVASIVE CV LAB;  Service: Cardiovascular;  Laterality: N/A;    Prior to Admission medications   Medication Sig Start Date End Date Taking? Authorizing Provider  acetaminophen (TYLENOL) 500 MG tablet Take 500 mg by mouth daily as needed for headache.    [provider]  albuterol (PROAIR HFA) 108 (90 BASE) MCG/ACT inhaler Inhale 2 puffs into the lungs every 6 (six) hours as needed for wheezing or shortness of breath.    [provider]  aspirin 81 MG tablet Take 81 mg by mouth daily.    [provider]  atorvastatin (LIPITOR) 40 MG tablet Take 40 mg by mouth daily.    [provider]  carvedilol (COREG) 3.125 MG tablet Take 1 tablet (3.125 mg total) by mouth 2 (two) times daily. 11/04/16   [name], MD  digoxin (LANOXIN) 0.125 MG tablet Take 1 tablet (0.125 mg total) by mouth daily. 01/06/17   [name], MD  Fluticasone-Salmeterol (ADVAIR) 250-50 MCG/DOSE AEPB Inhale 1 puff into the lungs 2 (two) times daily.    [provider]  ivabradine (CORLANOR) 5 MG TABS tablet Take 1 tablet (5 mg total) by mouth 2 (two) times daily with a meal. 01/31/17   [name], MD  losartan (COZAAR) 25 MG tablet Take 1 tablet (25 mg total) by mouth daily. 06/16/17   Julien Nordmann  J, MD  omeprazole (PRILOSEC) 20 MG capsule Take 20 mg by mouth daily. 04/15/17   [provider]  spironolactone (ALDACTONE) 25 MG tablet Take 1 tablet (25 mg total) by mouth daily. 03/10/17   [name], MD  tamsulosin (FLOMAX) 0.4 MG CAPS capsule Take 0.4 mg by mouth daily.    [provider]  tiotropium (SPIRIVA) 18 MCG inhalation capsule Place 18 mcg into inhaler and inhale daily.    [provider]  traMADol (ULTRAM) 50 MG tablet Take 1 tablet (50 mg total) by mouth 3 (three) times daily as needed for  moderate pain. Patient not taking: Reported on 05/27/2017 05/17/17 05/17/18  Janetta Hora, PA-C  vitamin C (ASCORBIC ACID) 500 MG tablet Take 1,000 mg by mouth daily.     [provider]    Allergies Dobutamine; Amlodipine; Lisinopril; and Metoprolol  Family History  Problem Relation Age of Onset  . Heart disease Father   . Heart failure Father   . Hypertension Mother   . Hyperlipidemia Mother     Social History Social History  Substance Use Topics  . Smoking status: Former Smoker    Years: 0.00    Types: Cigarettes  . Smokeless tobacco: Former Neurosurgeon    Types: Snuff  . Alcohol use Yes     Comment: occasional Beer     Review of Systems Constitutional: No fever/chills Eyes: No visual changes. ENT: No sore throat. Cardiovascular: Denies chest pain. Respiratory: Positive for shortness of breath. Gastrointestinal: No abdominal pain.  No nausea, no vomiting.  No diarrhea.   Genitourinary: Negative for dysuria. Musculoskeletal: Negative for back pain. Skin: Negative for rash. Neurological: Negative for headaches, focal weakness or numbness.  ____________________________________________   PHYSICAL EXAM:  VITAL SIGNS: ED Triage Vitals  Enc Vitals Group     BP 07/14/17 1941 (!) 119/54     Pulse Rate 07/14/17 1941 87     Resp 07/14/17 1941 (!) 24     Temp 07/14/17 1941 98.6 F (37 C)     Temp Source 07/14/17 1941 Oral     SpO2 07/14/17 1941 96 %     Weight 07/14/17 1942 195 lb (88.5 kg)     Height 07/14/17 1942 5\' 10"  (1.778 m)   Constitutional: Alert and oriented. Well appearing and in no distress. Eyes: Conjunctivae are normal.  ENT   Head: Normocephalic and atraumatic.   Nose: No congestion/rhinnorhea.   Mouth/Throat: Mucous membranes are moist.   Neck: No stridor. Hematological/Lymphatic/Immunilogical: No cervical lymphadenopathy. Cardiovascular: Normal rate, regular rhythm.  Systolic murmur. Respiratory: Normal respiratory effort  without tachypnea nor retractions. Poor air movement diffusely. Gastrointestinal: Soft and non tender. No rebound. No guarding.  Genitourinary: Deferred Musculoskeletal: Normal range of motion in all extremities. No lower extremity edema. Neurologic:  Normal speech and language. No gross focal neurologic deficits are appreciated.  Skin:  Skin is warm, dry and intact. No rash noted. Psychiatric: Mood and affect are normal. Speech and behavior are normal. Patient exhibits appropriate insight and judgment.  ____________________________________________    LABS (pertinent positives/negatives)  Trop <0.03 CBC wbc 10.0, hgb 14.0 BMP wnl except for cl 97  ____________________________________________   EKG  I, Phineas Semen, attending physician, personally viewed and interpreted this EKG  EKG Time: 1938 Rate: 92 Rhythm: atrial sensed ventricular paced rhythm Axis: normal Intervals: qtc 477 QRS: wide paced ST changes: no st elevation Impression: abnormal ekg   ____________________________________________    RADIOLOGY  CXR No pneumonia  ____________________________________________  PROCEDURES  Procedures  ____________________________________________   INITIAL IMPRESSION / ASSESSMENT AND PLAN / ED COURSE  Pertinent labs & imaging results that were available during my care of the patient were reviewed by me and considered in my medical decision making (see chart for details).  Patient presented because of concern for shortness of breath. ddx would include pna, ptx, pe, acs, effusions, amongst other etiologies. No concerning findings on blood work or cxr. Doubt PE. Patient did feel better after steroids and breathing treatment, making COPD exacerbation likely. Discussed return precautions and plan with patient. Will prescribe steroids for the next couple of days.   ____________________________________________   FINAL CLINICAL IMPRESSION(S) / ED DIAGNOSES  Final  diagnoses:  COPD exacerbation (HCC)  SOB (shortness of breath)     Note: This dictation was prepared with Dragon dictation. Any transcriptional errors that result from this process are unintentional     Phineas SemenGoodman, Mirriam Vadala, MD 07/14/17 2219

## 2017-07-14 NOTE — ED Notes (Signed)
Pt return from xray.

## 2017-07-14 NOTE — ED Triage Notes (Signed)
Pt reports he started with cough copious amount yellow in color, today woke up with no voice reports increased shortness of breath with exertion, Pt has history of COPD on 3L/[place] all time, denies any chest pain at any other symptom at present

## 2017-07-17 ENCOUNTER — Telehealth: Payer: Self-pay

## 2017-07-17 MED ORDER — SPIRONOLACTONE 25 MG PO TABS: 25.0000 mg | ORAL_TABLET | Freq: Every day | ORAL | 0 refills | Status: AC

## 2017-07-17 NOTE — Telephone Encounter (Signed)
Refill sent for Spironolactone 25 mg  

## 2017-08-12 ENCOUNTER — Telehealth: Payer: Self-pay | Admitting: Cardiology

## 2017-08-12 ENCOUNTER — Other Ambulatory Visit: Payer: Self-pay

## 2017-08-12 DIAGNOSIS — I350 Nonrheumatic aortic (valve) stenosis: Secondary | ICD-10-CM

## 2017-08-12 NOTE — Telephone Encounter (Signed)
LMOVM requesting that pt send manual transmission b/c home monitor has not updated in at least 7 days.    

## 2017-08-12 NOTE — Progress Notes (Signed)
Echo order entered.

## 2017-09-05 ENCOUNTER — Telehealth: Payer: Self-pay | Admitting: Cardiovascular Disease

## 2017-09-05 ENCOUNTER — Encounter: Payer: Self-pay | Admitting: Internal Medicine

## 2017-09-05 NOTE — Telephone Encounter (Signed)
S/w patient. He is in the donut hole and needs enough pills to get him through this month. Then in January he should be able to afford.  Medication Samples have been left at front desk for patient to pick up.  Drug name: Corlanor       Strength: 5 mg        Qty: Lot # [id number] Exp 03/2020 x2 bottles  Lot # [id number]  Exp 08/2017  x1 bottle

## 2017-09-05 NOTE — Telephone Encounter (Signed)
Would you like me to provide samples for patient.

## 2017-09-05 NOTE — Telephone Encounter (Signed)
Patient calling the office for samples of medication:   1.  What medication and dosage are you requesting samples for? corlanor  2.  Are you currently out of this medication?  Only has a few pills left

## 2017-09-10 ENCOUNTER — Ambulatory Visit: Payer: Medicare Other | Admitting: Internal Medicine

## 2017-09-10 ENCOUNTER — Encounter: Payer: Self-pay | Admitting: Internal Medicine

## 2017-09-10 ENCOUNTER — Ambulatory Visit (HOSPITAL_COMMUNITY): Payer: Medicare Other | Attending: Internal Medicine

## 2017-09-10 VITALS — BP 128/72 | HR 69 | Ht 70.0 in | Wt 204.4 lb

## 2017-09-10 DIAGNOSIS — I5022 Chronic systolic (congestive) heart failure: Secondary | ICD-10-CM | POA: Diagnosis not present

## 2017-09-10 DIAGNOSIS — Z95810 Presence of automatic (implantable) cardiac defibrillator: Secondary | ICD-10-CM | POA: Diagnosis not present

## 2017-09-10 DIAGNOSIS — I082 Rheumatic disorders of both aortic and tricuspid valves: Secondary | ICD-10-CM | POA: Diagnosis not present

## 2017-09-10 DIAGNOSIS — I350 Nonrheumatic aortic (valve) stenosis: Secondary | ICD-10-CM | POA: Diagnosis present

## 2017-09-10 DIAGNOSIS — I428 Other cardiomyopathies: Secondary | ICD-10-CM

## 2017-09-10 DIAGNOSIS — I447 Left bundle-branch block, unspecified: Secondary | ICD-10-CM

## 2017-09-10 DIAGNOSIS — I4901 Ventricular fibrillation: Secondary | ICD-10-CM

## 2017-09-10 LAB — ECHOCARDIOGRAM COMPLETE
HEIGHTINCHES: 70 in
WEIGHTICAEL: 3270.4 [oz_av]

## 2017-09-10 NOTE — Progress Notes (Signed)
PCP: Rory Percy, MD Primary Cardiologist:  Dr Kirke Corin Primary EP: Dr Mariane Baumgarten Benjamin Frost is a 76 y.o. male who presents today for routine electrophysiology followup.  Since his recent BiV ICD implant, the patient reports doing very well.  Today, he denies symptoms of palpitations, chest pain, shortness of breath,  lower extremity edema, dizziness, presyncope, syncope, or ICD shocks.  The patient is otherwise without complaint today.   Past Medical History:  Diagnosis Date  . Aortic stenosis    a. mod-severe low flow low gradient AS.   Marland Kitchen Chronic back pain   . Chronic systolic heart failure (HCC)    Ejection fraction of 15%  . COPD (chronic obstructive pulmonary disease) (HCC)   . History of BPH   . Hyperlipidemia   . Hypertension   . Kidney stone   . Left bundle branch block   . Sinus tachycardia    Past Surgical History:  Procedure Laterality Date  . BIV ICD INSERTION CRT-D N/A 05/29/2017   Procedure: BIV ICD INSERTION CRT-D;  Surgeon: Benjamin Range, MD;  Location: [facility] INVASIVE CV LAB;  Service: Cardiovascular;  Laterality: N/A;  . CARDIAC CATHETERIZATION  2012   [facility]  . CARD[facility]AC CATHETERIZATION     [facility]  . LEFT AND RIGHT HEART CATHETERIZATION WITH CORONARY ANGIOGRAM N/A 03/23/2014   Procedure: LEFT AND RIGHT HEART CATHETERIZATION WITH CORONARY ANGIOGRAM;  Surgeon: [name], MD;  Location: [facility] CATH LAB;  Service: Cardiovascular;  Laterality: N/A;  . RIGHT/LEFT HEART CATH AND CORONARY ANGIOGRAPHY N/A 05/16/2017   Procedure: RIGHT/LEFT HEART CATH AND CORONARY ANGIOGRAPHY;  Surgeon: [name], MD;  Location: [facility] INVASIVE CV LAB;  Service: Cardiovascular;  Laterality: N/A;    ROS- all systems are reviewed and negative except as per HPI above  Current Outpatient Medications  Medication Sig Dispense Refill  . acetaminophen (TYLENOL) 500 MG tablet Take 500 mg by mouth daily as needed for headache.    . albuterol (PROAIR HFA) 108 (90 BASE) MCG/ACT inhaler Inhale 2 puffs  into the lungs every 6 (six) hours as needed for wheezing or shortness of breath.    Marland Kitchen aspirin 81 MG tablet Take 81 mg by mouth daily.    Marland Kitchen atorvastatin (LIPITOR) 40 MG tablet Take 40 mg by mouth daily.    . carvedilol (COREG) 3.125 MG tablet Take 1 tablet (3.125 mg total) by mouth 2 (two) times daily. 60 tablet 3  . digoxin (LANOXIN) 0.125 MG tablet Take 1 tablet (0.125 mg total) by mouth daily. 90 tablet 3  . Fluticasone-Salmeterol (ADVAIR) 250-50 MCG/DOSE AEPB Inhale 1 puff into the lungs 2 (two) times daily.    . ivabradine (CORLANOR) 5 MG TABS tablet Take 1 tablet (5 mg total) by mouth 2 (two) times daily with a meal. 60 tablet 3  . losartan (COZAAR) 25 MG tablet Take 1 tablet (25 mg total) by mouth daily. 30 tablet 3  . omeprazole (PRILOSEC) 20 MG capsule Take 20 mg by mouth daily.    Marland Kitchen spironolactone (ALDACTONE) 25 MG tablet Take 1 tablet (25 mg total) by mouth daily. 30 tablet 0  . tamsulosin (FLOMAX) 0.4 MG CAPS capsule Take 0.4 mg by mouth daily.    . Tdap (BOOSTRIX) 5-2.5-18.5 LF-MCG/0.5 injection Inject as directed once.    . tiotropium (SPIRIVA) 18 MCG inhalation capsule Place 18 mcg into inhaler and inhale daily.    . vitamin C (ASCORBIC ACID) 500 MG tablet Take 1,000 mg by mouth daily.  No current facility-administered medications for this visit.     Physical Exam: Vitals:   09/10/17 1341  BP: 128/72  Pulse: 69  SpO2: 94%  Weight: 204 lb 6.4 oz (92.7 kg)  Height: 5\' 10"  (1.778 m)    GEN- The patient is well appearing, alert and oriented x 3 today.   Head- normocephalic, atraumatic Eyes-  Sclera clear, conjunctiva pink Ears- hearing intact Oropharynx- clear Lungs- Clear to ausculation bilaterally, normal work of breathing Chest- ICD pocket is well healed Heart- Regular rate and rhythm, no murmurs, rubs or gallops, PMI not laterally displaced GI- soft, NT, ND, + BS Extremities- no clubbing, cyanosis, or edema  ICD interrogation- reviewed in detail today,  See  PACEART report  ekg tracing ordered today is personally reviewed and shows sinus rhythm with V pacing  Assessment and Plan:  1.  Chronic systolic dysfunction (EF 25%), NYHA Class III CHF, LBBB euvolemic today Stable on an appropriate medical regimen Normal BiV ICD function See Pace Art report LV lead threshold was slightly changed from implant.  Vector changed to D1M2 pair today with threshold of 1V@.4 msec with no diaphragmatic stim Echo pending to evaluate response to CRT  2. S/p VF arrest Normal ICD function No driving x 6 months post VF arrest  Merlin Return to see EP NP in a year Follow-up with Dr Kirke CorinArida in 3 months  Benjamin RangeJames Jaceon Heiberger MD, [facility] 09/10/2017 2:10 PM

## 2017-09-10 NOTE — Patient Instructions (Signed)
Medication Instructions:  Your physician recommends that you continue on your current medications as directed. Please refer to the Current Medication list given to you today.   Labwork: None ordered   Testing/Procedures: None ordered   Follow-Up: Your physician recommends that you schedule a follow-up appointment in: 3 months with Dr Kirke Corin  Your physician wants you to follow-up in: 12 months with Gypsy Balsam, RN You will receive a reminder letter in the mail two months in advance. If you don't receive a letter, please call our office to schedule the follow-up appointment.  Remote monitoring is used to monitor your ICD from home. This monitoring reduces the number of office visits required to check your device to one time per year. It allows [place] to keep an eye on the functioning of your device to ensure it is working properly. You are scheduled for a device check from home on 12/10/17. You may send your transmission at any time that day. If you have a wireless device, the transmission will be sent automatically. After your physician reviews your transmission, you will receive a postcard with your next transmission date.      Any Other Special Instructions Will Be Listed Below (If Applicable).     If you need a refill on your cardiac medications before your next appointment, please call your pharmacy.

## 2017-09-18 LAB — CUP PACEART INCLINIC DEVICE CHECK
Battery Remaining Longevity: 68 mo
Brady Statistic RA Percent Paced: 8.7 %
HighPow Impedance: 76.5 Ohm
Implantable Lead Implant Date: 20180906
Implantable Lead Implant Date: 20180906
Implantable Lead Location: 753858
Implantable Lead Location: 753859
Implantable Lead Location: 753860
Lead Channel Impedance Value: 1112.5 Ohm
Lead Channel Pacing Threshold Amplitude: 0.75 V
Lead Channel Pacing Threshold Pulse Width: 0.5 ms
Lead Channel Pacing Threshold Pulse Width: 0.5 ms
Lead Channel Sensing Intrinsic Amplitude: 3.6 mV
Lead Channel Sensing Intrinsic Amplitude: 9.9 mV
Lead Channel Setting Pacing Amplitude: 2 V
Lead Channel Setting Pacing Pulse Width: 0.5 ms
Lead Channel Setting Sensing Sensitivity: 0.5 mV
MDC IDC LEAD IMPLANT DT: 20180906
MDC IDC MSMT LEADCHNL LV PACING THRESHOLD AMPLITUDE: 1 V
MDC IDC MSMT LEADCHNL RA IMPEDANCE VALUE: 562.5 Ohm
MDC IDC MSMT LEADCHNL RV IMPEDANCE VALUE: 525 Ohm
MDC IDC MSMT LEADCHNL RV PACING THRESHOLD AMPLITUDE: 0.5 V
MDC IDC MSMT LEADCHNL RV PACING THRESHOLD PULSEWIDTH: 0.5 ms
MDC IDC PG IMPLANT DT: 20180906
MDC IDC PG SERIAL: 7421078
MDC IDC SESS DTM: 20181219194951
MDC IDC SET LEADCHNL LV PACING PULSEWIDTH: 0.5 ms
MDC IDC SET LEADCHNL RA PACING AMPLITUDE: 2 V
MDC IDC SET LEADCHNL RV PACING AMPLITUDE: 2.5 V
MDC IDC STAT BRADY RV PERCENT PACED: 95 %

## 2017-09-25 ENCOUNTER — Ambulatory Visit: Payer: Medicare Other | Admitting: Cardiovascular Disease

## 2017-09-25 ENCOUNTER — Encounter: Payer: Self-pay | Admitting: Cardiovascular Disease

## 2017-09-25 VITALS — BP 114/58 | HR 85 | Ht 70.0 in | Wt 204.8 lb

## 2017-09-25 DIAGNOSIS — I350 Nonrheumatic aortic (valve) stenosis: Secondary | ICD-10-CM | POA: Diagnosis not present

## 2017-09-25 DIAGNOSIS — I359 Nonrheumatic aortic valve disorder, unspecified: Secondary | ICD-10-CM | POA: Diagnosis not present

## 2017-09-25 DIAGNOSIS — I5022 Chronic systolic (congestive) heart failure: Secondary | ICD-10-CM

## 2017-09-25 NOTE — Progress Notes (Signed)
Cardiology Office Note Date:  09/25/2017   ID:  Benjamin Frost, DOB 03-Dec-1940, MRN [id number]  PCP:  Rory Percy, MD  Cardiologist:  [name], MD    Chief Complaint  Patient presents with  . Shortness of Breath     History of Present Illness: Benjamin Frost is a 77 y.o. male who presents for evaluation of severe aortic stenosis.   The patient was initially seen in August 2018 when he was under evaluation for severe low gradient aortic stenosis.  He has a history of nonischemic cardiomyopathy and left bundle branch block.  He underwent a dobutamine echo study to better interrogate the degree of his aortic stenosis, and he suffered a ventricular fibrillation arrest during the study.  He was promptly defibrillated and resuscitated.  He then underwent left and right heart catheterization showing widely patent coronary arteries.  His aortic stenosis was felt to be moderate at the time.  He underwent cardiac resynchronization therapy without complication.  He recently had a follow-up echocardiogram demonstrating improvement in LV function and worsening of aortic stenosis based on Doppler data.  The patient's recent follow-up echocardiogram demonstrates an LVEF that is now in the 40-45% range with a mean transaortic valve gradient of 31 mmHg, peak systolic velocity of about 350 cm/s, dimensionless index of 0.21, and calculated aortic valve area of 0.8 cm.  The patient reports continued dyspnea with low level exertion.  He is comfortable at rest without symptoms.  States when he does heavy strenuous work he does have chest discomfort in the upper chest region it resolves quickly with rest.  His breathing improved with use of albuterol.  He has chronic orthopnea and sleeps in a recliner.  He denies edema, lightheadedness, syncope, heart palpitations, or PND.  He would be short of breath with 1 block or less of walking.  Past Medical History:  Diagnosis Date  . Aortic stenosis    a.  mod-severe low flow low gradient AS.   Marland Kitchen Chronic back pain   . Chronic systolic heart failure (HCC)    Ejection fraction of 15%  . COPD (chronic obstructive pulmonary disease) (HCC)   . History of BPH   . Hyperlipidemia   . Hypertension   . Kidney stone   . Left bundle branch block   . Sinus tachycardia     Past Surgical History:  Procedure Laterality Date  . 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 CARDIAC CATHETERIZATION  2012   [facility]  . CARDIAC CATHETERIZATION     [facility]  . LEFT AND RIGHT HEART CATHETERIZATION WITH CORONARY ANGIOGRAM N/A 03/23/2014   Procedure: LEFT AND RIGHT HEART CATHETERIZATION WITH CORONARY ANGIOGRAM;  Surgeon: [name], MD;  Location: [facility] CATH [facility];  Service: Cardiovascular;  Laterality: N/A;  . RIGHT/LEFT HEART CATH AND CORONARY ANGIOGRAPHY N/A 05/16/2017   Procedure: RIGHT/LEFT HEART CATH AND CORONARY ANGIOGRAPHY;  Surgeon: [name], MD;  Location: [facility] INVASIVE CV [facility];  Service: Cardiovascular;  Laterality: N/A;    Current Outpatient Medications  Medication Sig Dispense Refill  . acetaminophen (TYLENOL) 500 MG tablet Take 500 mg by mouth daily as needed for headache.    . albuterol (PROAIR HFA) 108 (90 BASE) MCG/ACT inhaler Inhale 2 puffs into the lungs every 6 (six) hours as needed for wheezing or shortness of breath.    Marland Kitchen aspirin 81 MG tablet Take 81 mg by mouth daily.    Marland Kitchen  atorvastatin (LIPITOR) 40 MG tablet Take 40 mg by mouth daily.    . carvedilol (COREG) 3.125 MG tablet Take 1 tablet (3.125 mg total) by mouth 2 (two) times daily. 60 tablet 3  . digoxin (LANOXIN) 0.125 MG tablet Take 1 tablet (0.125 mg total) by mouth daily. 90 tablet 3  . Fluticasone-Salmeterol (ADVAIR) 250-50 MCG/DOSE AEPB Inhale 1 puff into the lungs 2 (two) times daily.    . ivabradine (CORLANOR) 5 MG TABS tablet Take 1 tablet (5 mg total) by mouth 2 (two) times daily with a meal. 60 tablet  3  . losartan (COZAAR) 25 MG tablet Take 1 tablet (25 mg total) by mouth daily. 30 tablet 3  . omeprazole (PRILOSEC) 20 MG capsule Take 20 mg by mouth daily.    Marland Kitchen spironolactone (ALDACTONE) 25 MG tablet Take 1 tablet (25 mg total) by mouth daily. 30 tablet 0  . tamsulosin (FLOMAX) 0.4 MG CAPS capsule Take 0.4 mg by mouth daily.    Marland Kitchen tiotropium (SPIRIVA) 18 MCG inhalation capsule Place 18 mcg into inhaler and inhale daily.     No current facility-administered medications for this visit.     Allergies:   Dobutamine; Amlodipine; Lisinopril; and Metoprolol   Social History:  The patient  reports that he has quit smoking. His smoking use included cigarettes. He quit after 0.00 years of use. He has quit using smokeless tobacco. His smokeless tobacco use included snuff. He reports that he drinks alcohol. He reports that he does not use drugs.   Family History:  The patient's family history includes Heart disease in his father; Heart failure in his father; Hyperlipidemia in his mother; Hypertension in his mother.   ROS:  Please see the history of present illness.  All other systems are reviewed and negative.    PHYSICAL EXAM: VS:  BP (!) 114/58   Pulse 85   Ht 5\' 10"  (1.778 m)   Wt 204 lb 12.8 oz (92.9 kg)   SpO2 93%   BMI 29.39 kg/m  , BMI Body mass index is 29.39 kg/m. GEN: Well nourished, well developed, in no acute distress  HEENT: normal  Neck: no JVD, no masses. bilateral carotid bruits Cardiac: RRR with 3/6 harsh late peaking systolic murmur at the right upper sternal border with absent A2              Respiratory:  clear to auscultation bilaterally, normal work of breathing GI: soft, nontender, nondistended, + BS MS: no deformity or atrophy  Ext: no pretibial edema, pedal pulses 2+= bilaterally Skin: warm and dry, no rash Neuro:  Strength and sensation are intact Psych: euthymic mood, full affect  EKG:  EKG is not ordered today.  Recent Labs: 01/13/2017: B Natriuretic  Peptide 66.0 05/15/2017: ALT 15; Magnesium 1.9 07/14/2017: BUN 14; Creatinine, Ser 0.84; Hemoglobin 14.0; Platelets 136; Potassium 4.1; Sodium 135   Lipid Panel     Component Value Date/Time   CHOL 117 02/28/2014 0442   TRIG 65 02/28/2014 0442   HDL 44 02/28/2014 0442   VLDL 13 02/28/2014 0442   LDLCALC 60 02/28/2014 0442      Wt Readings from Last 3 Encounters:  09/25/17 204 lb 12.8 oz (92.9 kg)  09/10/17 204 lb 6.4 oz (92.7 kg)  07/14/17 195 lb (88.5 kg)     Cardiac Studies Reviewed: Echo 09-10-2017: Study Conclusions  - Left ventricle: The cavity size was normal. Wall thickness was   normal. Doppler parameters are consistent with abnormal left  ventricular relaxation (grade 1 diastolic dysfunction). The E/e&'   ratio is between 8-15, suggesting indeterminate LV filling   pressure. - Aortic valve: Calcified. Severe stenosis and trivial   regurgitation. There was mild regurgitation. Mean gradient (S):   31 mm Hg. Peak gradient (S): 50 mm Hg. Valve area (VTI): 0.82   cm^2. Valve area (Vmax): 0.84 cm^2. Valve area (Vmean): 0.72   cm^2. - Mitral valve: Calcified annulus. Mildly thickened leaflets .   There was trivial regurgitation. - Left atrium: The atrium was normal in size. - Right ventricle: Pacer wire or catheter noted in right ventricle. - Right atrium: The atrium was normal in size. Pacer wire or   catheter noted in right atrium. - Tricuspid valve: There was mild regurgitation. - Pulmonary arteries: PA peak pressure: 29 mm Hg (S). - Inferior vena cava: The vessel was normal in size. The   respirophasic diameter changes were in the normal range (= 50%),   consistent with normal central venous pressure.  Impressions:  - Compared to a prior study in 04/2017, the LVEF is higher at 40-45%   with global hypokinesis. There is now severe aortic stenosis with   mean gradient of 31 mmHg and peak gradient of 50 mmHg -   calculated AVA of 0.8  cm2.  ------------------------------------------------------------------- Study data:  Comparison was made to the study of 04/14/2017.  Study status:  Routine.  Procedure:  The patient reported no pain pre or post test. Transthoracic echocardiography. Image quality was adequate.          Transthoracic echocardiography.  M-mode, complete 2D, spectral Doppler, and color Doppler.  Birthdate: Patient birthdate: 1941-04-29.  Age:  Patient is 77 yr old.  Sex: Gender: male.    BMI: 29.3 kg/m^2.  Blood pressure:     128/72 Patient status:  Outpatient.  Study date:  Study date: 09/10/2017. Study time: 02:42 PM.  Location:  [facility]  -------------------------------------------------------------------  ------------------------------------------------------------------- Left ventricle:  The cavity size was normal. Wall thickness was normal. Doppler parameters are consistent with abnormal left ventricular relaxation (grade 1 diastolic dysfunction). The E/e&' ratio is between 8-15, suggesting indeterminate LV filling pressure.  ------------------------------------------------------------------- Aortic valve:  Calcified. Severe stenosis and trivial regurgitation.  Doppler:  There was mild regurgitation.    VTI ratio of LVOT to aortic valve: 0.24. Valve area (VTI): 0.82 cm^2. Indexed valve area (VTI): 0.38 cm^2/m^2. Peak velocity ratio of LVOT to aortic valve: 0.24. Valve area (Vmax): 0.84 cm^2. Indexed valve area (Vmax): 0.39 cm^2/m^2. Mean velocity ratio of LVOT to aortic valve: 0.21. Valve area (Vmean): 0.72 cm^2. Indexed valve area (Vmean): 0.33 cm^2/m^2.    Mean gradient (S): 31 mm Hg. Peak gradient (S): 50 mm Hg.  ------------------------------------------------------------------- Aorta:  Aortic root: The aortic root was normal in size. Ascending aorta: The ascending aorta was normal in size.  ------------------------------------------------------------------- Mitral valve:    Calcified annulus. Mildly thickened leaflets . Doppler:  There was trivial regurgitation.    Valve area by pressure half-time: 3.06 cm^2. Indexed valve area by pressure half-time: 1.42 cm^2/m^2.    Peak gradient (D): 3 mm Hg.  ------------------------------------------------------------------- Left atrium:  The atrium was normal in size.  ------------------------------------------------------------------- Atrial septum:  No defect or patent foramen ovale was identified.   ------------------------------------------------------------------- Right ventricle:  The cavity size was normal. Wall thickness was normal. Pacer wire or catheter noted in right ventricle. Systolic function was normal.  ------------------------------------------------------------------- Pulmonic valve:    The valve appears to be grossly normal. Doppler:  There was no significant regurgitation.  ------------------------------------------------------------------- Tricuspid valve:   Doppler:  There was mild regurgitation.  ------------------------------------------------------------------- Pulmonary artery:   The main pulmonary artery was normal-sized.  ------------------------------------------------------------------- Right atrium:  The atrium was normal in size. Pacer wire or catheter noted in right atrium.  ------------------------------------------------------------------- Pericardium:  There was no pericardial effusion.  ------------------------------------------------------------------- Systemic veins: Inferior vena cava: The vessel was normal in size. The respirophasic diameter changes were in the normal range (= 50%), consistent with normal central venous pressure. Diameter: 15 mm.  Cardiac Cath 05-16-2017: Conclusion   Conclusions: 1. No angiographically significant coronary artery disease. 2. Mildly elevated left heart filling pressures. 3. Moderately elevated right heart filling  pressure. 4. Mild pulmonary hypertension. 5. Low normal Fick cardiac output. Normal to high thermal dilutional cardiac output (I suspect this is artifactually high). 6. Likely moderate aortic stenosis with mean aortic valve gradient of 20 mmHg, calculated valve area of ~1.4 cm^2. Aortic valve was crossed without difficulty.  Recommendations: 1. Continue medical therapy of chronic systolic heart failure secondary to nonischemic cardiomyopathy and moderate aortic stenosis.   ASSESSMENT AND PLAN: 1.  Severe, stage D1 aortic stenosis: The patient has exertional dyspnea and angina, both relieved with rest.  He clearly has a component of COPD with a remote history of long-standing tobacco abuse and use of nocturnal O2.  However, I suspect his aortic stenosis is also contributing to his symptoms.  I personally reviewed his echo images today which demonstrate calcification and restriction of his aortic valve leaflets, hemodynamics consistent with moderate to severe aortic stenosis.  While his mean gradient is less than 40 mmHg, the patient's dimensionless index is less than 0.25 and his LV function remains at least mildly impaired.  His exam is certainly suggestive of severe aortic stenosis with a late peaking harsh murmur and an absent aortic closure sound.  I think in the context of his symptoms and echo/exam findings, it is reasonable to consider treatment options.  I have recommended that we proceed with pulmonary function testing and CT angiogram studies of the heart and pelvic vessels.  The patient will then be referred for formal cardiac surgery evaluation.  He has no significant coronary disease and should not require repeat cardiac catheterization as this was just done about 5 months ago.  We discussed the natural history of aortic stenosis today as well as potential symptoms that can arise from this.  We reviewed specific treatment options including conventional aortic valve replacement and  transcatheter aortic valve replacement in the context of this patient's known medical comorbidities.  The patient's STS predicted risk of mortality is greater than 3% based on his age, LV dysfunction, and severe COPD.  TAVR might be a reasonable treatment option.  We discussed the specific risks and indications of TAVR as well as the expected recovery.  He understands and wishes to proceed with further evaluation as outlined above.  All of his questions are answered today.  Current medicines are reviewed with the patient today.  The patient does not have concerns regarding medicines.  Labs/ tests ordered today include:   Orders Placed This Encounter  Procedures  . Basic metabolic panel    Disposition:   FU pending further testing  Signed, [name], MD  09/25/2017 5:19 PM    [facility] [address] [place], [place], [place]  [address] Phone: [phone]; Fax: [phone]

## 2017-09-25 NOTE — Patient Instructions (Signed)
Medication Instructions:  Your provider recommends that you continue on your current medications as directed. Please refer to the Current Medication list given to you today.    Labwork: BMET  Testing/Procedures: You will have several scans scheduled. You will be called to arrange these.  Follow-Up: Lauren (the TAVR nurse) will call you to arrange appointments.  Any Other Special Instructions Will Be Listed Below (If Applicable).     If you need a refill on your cardiac medications before your next appointment, please call your pharmacy.

## 2017-09-26 ENCOUNTER — Other Ambulatory Visit: Payer: Self-pay

## 2017-09-26 DIAGNOSIS — I350 Nonrheumatic aortic (valve) stenosis: Secondary | ICD-10-CM

## 2017-09-29 ENCOUNTER — Other Ambulatory Visit: Payer: Medicare Other | Admitting: *Deleted

## 2017-09-29 DIAGNOSIS — I359 Nonrheumatic aortic valve disorder, unspecified: Secondary | ICD-10-CM

## 2017-09-29 LAB — BASIC METABOLIC PANEL
BUN/Creatinine Ratio: 14 (ref 10–24)
BUN: 11 mg/dL (ref 8–27)
CALCIUM: 9.4 mg/dL (ref 8.6–10.2)
CO2: 26 mmol/L (ref 20–29)
Chloride: 100 mmol/L (ref 96–106)
Creatinine, Ser: 0.81 mg/dL (ref 0.76–1.27)
GFR, EST AFRICAN AMERICAN: 98 mL/min/{1.73_m2} (ref >59)
GFR, EST NON AFRICAN AMERICAN: 85 mL/min/{1.73_m2} (ref >59)
Glucose: 103 mg/dL — ABNORMAL HIGH (ref 65–99)
POTASSIUM: 4.3 mmol/L (ref 3.5–5.2)
Sodium: 140 mmol/L (ref 134–144)

## 2017-10-02 ENCOUNTER — Encounter: Payer: Medicare Other | Admitting: Thoracic Surgery (Cardiothoracic Vascular Surgery)

## 2017-10-02 ENCOUNTER — Ambulatory Visit: Payer: Medicare Other | Admitting: Physical Therapy

## 2017-10-03 ENCOUNTER — Encounter: Payer: Self-pay | Admitting: Physical Therapy

## 2017-10-03 ENCOUNTER — Ambulatory Visit (HOSPITAL_COMMUNITY)
Admission: RE | Admit: 2017-10-03 | Discharge: 2017-10-03 | Disposition: A | Discharge status: Home / Self Care | Payer: Medicare Other | Source: Ambulatory Visit | Attending: Cardiovascular Disease | Admitting: Cardiovascular Disease

## 2017-10-03 ENCOUNTER — Ambulatory Visit: Payer: Medicare Other | Attending: Cardiovascular Disease | Admitting: Physical Therapy

## 2017-10-03 ENCOUNTER — Encounter: Payer: Self-pay | Admitting: Thoracic Surgery (Cardiothoracic Vascular Surgery)

## 2017-10-03 ENCOUNTER — Encounter (HOSPITAL_COMMUNITY): Payer: Medicare Other

## 2017-10-03 ENCOUNTER — Ambulatory Visit (HOSPITAL_BASED_OUTPATIENT_CLINIC_OR_DEPARTMENT_OTHER)
Admission: RE | Admit: 2017-10-03 | Discharge: 2017-10-03 | Disposition: A | Discharge status: Home / Self Care | Payer: Medicare Other | Source: Ambulatory Visit | Attending: Cardiovascular Disease | Admitting: Cardiovascular Disease

## 2017-10-03 ENCOUNTER — Institutional Professional Consult (permissible substitution): Payer: Medicare Other | Admitting: Thoracic Surgery (Cardiothoracic Vascular Surgery)

## 2017-10-03 ENCOUNTER — Other Ambulatory Visit: Payer: Self-pay

## 2017-10-03 VITALS — BP 122/85 | HR 89 | Resp 20 | Ht 70.0 in | Wt 205.0 lb

## 2017-10-03 DIAGNOSIS — I350 Nonrheumatic aortic (valve) stenosis: Secondary | ICD-10-CM

## 2017-10-03 DIAGNOSIS — R2689 Other abnormalities of gait and mobility: Secondary | ICD-10-CM | POA: Insufficient documentation

## 2017-10-03 DIAGNOSIS — I428 Other cardiomyopathies: Secondary | ICD-10-CM

## 2017-10-03 DIAGNOSIS — N400 Enlarged prostate without lower urinary tract symptoms: Secondary | ICD-10-CM | POA: Diagnosis not present

## 2017-10-03 DIAGNOSIS — K5730 Diverticulosis of large intestine without perforation or abscess without bleeding: Secondary | ICD-10-CM | POA: Diagnosis not present

## 2017-10-03 DIAGNOSIS — I2510 Atherosclerotic heart disease of native coronary artery without angina pectoris: Secondary | ICD-10-CM | POA: Diagnosis not present

## 2017-10-03 DIAGNOSIS — I700 Atherosclerosis of aorta: Secondary | ICD-10-CM | POA: Insufficient documentation

## 2017-10-03 DIAGNOSIS — I358 Other nonrheumatic aortic valve disorders: Secondary | ICD-10-CM | POA: Diagnosis not present

## 2017-10-03 DIAGNOSIS — J432 Centrilobular emphysema: Secondary | ICD-10-CM | POA: Insufficient documentation

## 2017-10-03 DIAGNOSIS — I5022 Chronic systolic (congestive) heart failure: Secondary | ICD-10-CM

## 2017-10-03 LAB — PULMONARY FUNCTION TEST
DL/VA % PRED: 83 %
DL/VA: 3.82 ml/min/mmHg/L
DLCO UNC % PRED: 46 %
DLCO UNC: 14.89 ml/min/mmHg
FEF 25-75 POST: 0.5 L/s
FEF 25-75 Pre: 0.36 L/sec
FEF2575-%Change-Post: 37 %
FEF2575-%PRED-PRE: 17 %
FEF2575-%Pred-Post: 24 %
FEV1-%Change-Post: 14 %
FEV1-%Pred-Post: 36 %
FEV1-%Pred-Pre: 31 %
FEV1-PRE: 0.93 L
FEV1-Post: 1.07 L
FEV1FVC-%Change-Post: -3 %
FEV1FVC-%Pred-Pre: 63 %
FEV6-%Change-Post: 14 %
FEV6-%Pred-Post: 56 %
FEV6-%Pred-Pre: 49 %
FEV6-POST: 2.17 L
FEV6-Pre: 1.89 L
FEV6FVC-%Change-Post: -3 %
FEV6FVC-%PRED-POST: 95 %
FEV6FVC-%Pred-Pre: 99 %
FVC-%Change-Post: 18 %
FVC-%PRED-PRE: 49 %
FVC-%Pred-Post: 58 %
FVC-POST: 2.41 L
FVC-PRE: 2.03 L
Post FEV1/FVC ratio: 44 %
Post FEV6/FVC ratio: 90 %
Pre FEV1/FVC ratio: 46 %
Pre FEV6/FVC Ratio: 93 %
RV % pred: 197 %
RV: 5.2 L
TLC % PRED: 109 %
TLC: 7.71 L

## 2017-10-03 MED ORDER — ALBUTEROL SULFATE (2.5 MG/3ML) 0.083% IN NEBU
2.5000 mg | INHALATION_SOLUTION | Freq: Once | RESPIRATORY_TRACT | Status: AC
  Administered 2017-10-03: 2.5 mg via RESPIRATORY_TRACT

## 2017-10-03 MED ORDER — IOPAMIDOL (ISOVUE-370) INJECTION 76%
INTRAVENOUS | Status: AC
  Administered 2017-10-03: 100 mL
  Filled 2017-10-03: qty 100

## 2017-10-03 NOTE — Progress Notes (Signed)
Carotid artery duplex has been completed. 1-39% ICA stenosis bilaterally.  10/03/17 11:13 AM Olen Cordial RVT

## 2017-10-03 NOTE — Therapy (Signed)
[facility] [address] [place], [place], [address] Phone: [phone]   Fax:  [phone]  Physical Therapy Evaluation  Patient Details  Name: Benjamin Frost MRN: [id number] Date of Birth: 10-19-1940 Referring Provider: Dr. [name]   Encounter Date: 10/03/2017  PT End of Session - 10/03/17 0914    Visit Number  1    PT Start Time  0913    PT Stop Time  0952    PT Time Calculation (min)  39 min       Past Medical History:  Diagnosis Date  . Aortic stenosis    a. mod-severe low flow low gradient AS.   Marland Kitchen Chronic back pain   . Chronic systolic heart failure (HCC)    Ejection fraction of 15%  . COPD (chronic obstructive pulmonary disease) (HCC)   . History of BPH   . Hyperlipidemia   . Hypertension   . Kidney stone   . Left bundle branch block   . Sinus tachycardia     Past Surgical History:  Procedure Laterality Date  . 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 CARDIAC CATHETERIZATION  2012   [facility]  . C[facility]AC [facility]TERIZATION     [facility]  . LEFT AND RIGHT HEART CATHETERIZATION WITH CORONARY ANGIOGRAM N/A 03/23/2014   Procedure: LEFT AND RIGHT HEART CATHETERIZATION WITH CORONARY ANGIOGRAM;  Surgeon: [name], MD;  Location: [facility] CATH LAB;  Service: Cardiovascular;  Laterality: N/A;  . RIGHT/LEFT HEART CATH AND CORONARY ANGIOGRAPHY N/A 05/16/2017   Procedure: RIGHT/LEFT HEART CATH AND CORONARY ANGIOGRAPHY;  Surgeon: [name], MD;  Location: [facility] INVASIVE CV LAB;  Service: Cardiovascular;  Laterality: N/A;    There were no vitals filed for this visit.   Subjective Assessment - 10/03/17 0916    Subjective  Pt reports progressive dyspnea, worsening in the last year, with low level activity including dressing, short distance walking and other light activities. Pt is very limited in activity and performs most activities seated.      Patient Stated Goals  to fix heart and breathe beter    Currently in Pain?  No/denies         [facility] PT Assessment - 10/03/17 0001      Assessment   Medical Diagnosis  severe aortic stenosis    Referring Provider  Dr. [name]    Onset Date/Surgical Date  -- approximately 1 year ago      Precautions   Precautions  ICD/Pacemaker      Restrictions   Weight Bearing Restrictions  No      Balance Screen   Has the patient fallen in the past 6 months  No    Has the patient had a decrease in activity level because of a fear of falling?   No    Is the patient reluctant to leave their home because of a fear of falling?   No      Home Environment   Living Environment  Private residence    Living Arrangements  Spouse/significant other    Home Access  Stairs to enter    Entrance Stairs-Number of Steps  1    Entrance Stairs-Rails  None    Home Layout  One level      Prior Function   Level of Independence  Independent with community mobility without device      Posture/Postural Control   Posture/Postural Control  Postural limitations    Postural Limitations  Rounded Shoulders;Forward head      ROM / Strength   AROM / PROM / Strength  Strength;AROM      AROM   Overall AROM Comments  grossly WNL throughout      Strength   Overall Strength Comments  grossly 5/5 throughout    Strength Assessment Site  Hand    Right/Left hand  Right;Left    Right Hand Grip (lbs)  80 R hand dominant    Left Hand Grip (lbs)  65      Ambulation/Gait   Gait Comments  Pt ambulates without significant gait deviations but fatigues quickly due to shortness of breath. Gait distancs limited by 75% for age/gender in 6 minute walk.        [facility] Pre-Surgical Assessment - 10/03/17 0001    5 Meter Walk Test- trial 1  6 sec    5 Meter Walk Test- trial 2  5 sec.     5 Meter Walk Test- trial 3  6 sec.    5 meter walk test average  5.67 sec    4 Stage Balance Test tolerated for:   10 sec.    4 Stage  Balance Test Position  4    Sit To Stand Test- trial 1  10 sec.    ADL/IADL Independent with:  Bathing;Dressing;Meal prep;Finances    ADL/IADL Needs Assistance with:  Pincus Badder work    ADL/IADL Fraility Index  Vulnerable    6 Minute Walk- Baseline  --    BP (mmHg)  117/84    HR (bpm)  64    02 Sat (%RA)  95 %    Modified Borg Scale for Dyspnea  0.5- Very, very slight shortness of breath    Perceived Rate of Exertion (Borg)  6-    6 Minute Walk Post Test  yes    BP (mmHg)  129/77    HR (bpm)  85    02 Sat (%RA)  89 %    Modified Borg Scale for Dyspnea  5- Strong or hard breathing    Perceived Rate of Exertion (Borg)  13- Somewhat hard    Aerobic Endurance Distance Walked  445           Objective measurements completed on examination: See above findings.                           Plan - 10/03/17 0955    Clinical Impression Statement  see below    PT Frequency  One time visit      Clinical Impression Statement: Pt is a 77 yo male presenting to OP PT for evaluation prior to possible TAVR surgery due to severe aortic stenosis. Pt reports onset of worsening shortness of breath with low level activity approximately 12 months ago. Symptoms are limiting his ability to groom and dress easily and walk community distances. Pt presents with good ROM and strength, good balance and is not at high fall risk 4 stage balance test, good walking speed and poor aerobic endurance per 6 minute walk test. Pt ambulated 260 feet in 2:30 before requesting a seated rest beak lasting 3:10. At time of rest, patient's HR was 85 bpm and O2 was 89 on room air. Pt reported 5/10 shortness of breath on modified scale for dyspnea. Pt able to resume after rest and ambulate an additional 185 feet. Pt ambulated a total of 445 feet in 6  minute walk. Based on the Short Physical Performance Battery, patient has a frailty rating of 12/12 with </= 5/12 considered frail.   Patient demonstrated the following  deficits and impairments:     Visit Diagnosis: Other abnormalities of gait and mobility     Problem List Patient Active Problem List   Diagnosis Date Noted  . Nonischemic cardiomyopathy (HCC)   . Hypertension   . 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 Moderate aortic stenosis 10/21/2014  . Left bundle branch block 03/22/2014  . Chronic systolic heart failure (HCC)   . Sinus tachycardia     NICOLETTA,DANA, PT 10/03/2017, 9:56 AM  [facility] [address] [place], [place], [address] Phone: [phone]   Fax:  [phone]  Name: Benjamin Frost MRN: [id number] Date of Birth: [birth year]

## 2017-10-03 NOTE — Progress Notes (Signed)
[facility]  CARDIOTHORACIC SURGERY CONSULTATION REPORT  Referring Provider is Wellington Hampshire, MD PCP is Ruhl, Merri Ray, MD  Chief Complaint  Patient presents with  . New Patient (Initial Visit)    1st TAVR evaluation, CT/PFT/Carotid US 10/03/2017    HPI:  Patient is a 77 year old male with history of low gradient low ejection fraction aortic stenosis, nonischemic cardiomyopathy, left bundle branch block, recent VF arrest status post implantation of biventricular pacemaker with ICD, severe COPD with home nocturnal oxygen use and remote history of tobacco abuse, hypertension and hyperlipidemia who has been referred for surgical consultation to discuss treatment options for management of severe low gradient low ejection fraction aortic stenosis.  The patient has a long-standing history of symptoms of exertional shortness of breath that has been attributed to a combination of severe COPD, moderate aortic stenosis and nonischemic cardiomyopathy.  For the last several years he has been followed by Dr. Fletcher Anon.  Echocardiogram performed in July 2018 revealed severe left ventricular systolic dysfunction with ejection fraction estimated 25-30%.  Peak velocity across the aortic valve was measured 3.0 m/s corresponding to mean transvalvular gradient estimated 20 mmHg.  He was referred for dobutamine stress test which was performed on May 15, 2017.  During dobutamine infusion the patient suffered VF arrest.  He was promptly resuscitated and recovered uneventfully.  Catheterization performed the following day revealed no angiographically significant coronary artery disease with mildly elevated filling pressures.  Mean transvalvular gradient across the aortic valve was measured 20 mmHg corresponding to aortic valve area calculated 1.4 cm.  He subsequently underwent implantation of a biventricular pacemaker ICD May 29, 2017.  Since then the patient has  remained clinically stable although he continues to complain of significant exertional shortness of breath.  He was seen in follow-up recently by Dr. Burt Knack and follow-up echocardiogram demonstrated some improvement in left ventricular ejection fraction to 40-45% with biventricular pacing.  There was felt to be severe aortic stenosis with peak velocity across the aortic valve reported 3.5 m/s corresponding to mean transvalvular gradient estimated 31 mmHg setting of moderate to severe left ventricular systolic dysfunction.  The DVI was reported 0.21 and aortic valve area estimated between 0.72 and 0.84 cm.  The patient subsequently underwent CT angiography and has been referred for surgical consultation.  The patient is married and lives with his wife in the country [place].  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  He has remained quite active physically and retirement and enjoys hunting, fishing, and working out of doors.  However, he has been limited by long-standing exertional shortness of breath which has continued to progress.  He states that he gets short of breath with moderate activity and this has noticeably limited his lifestyle.  He denies any resting shortness of breath, PND, orthopnea, or lower extremity edema.  He has not had palpitations, dizzy spells, nor syncope since his VF arrest last summer.  He has a intermittent cough and uses oxygen at night.  Approximately 2 months ago he was treated as an outpatient for bronchitis.  Past Medical History:  Diagnosis Date  . Aortic stenosis    a. mod-severe low flow low gradient AS.   Marland Kitchen Chronic back pain   . Chronic systolic heart failure (HCC)    Ejection fraction of 15%  . COPD (chronic obstructive pulmonary disease) ([place])   . History of BPH   .  Hyperlipidemia   . Hypertension   . Kidney stone   . Left bundle branch block   . Sinus tachycardia     Past  Surgical History:  Procedure Laterality Date  . BIV ICD INSERTION CRT-D N/A 05/29/2017   SJM Quadra Assura implanted by Dr Rayann Heman for secondary prevention of sudden death  . CARDIAC CATHETERIZATION  2012   [facility]  . CARDIAC CATHETERIZATION     [facility]  . LEFT AND RIGHT HEART CATHETERIZATION WITH CORONARY ANGIOGRAM N/A 03/23/2014   Procedure: LEFT AND RIGHT HEART CATHETERIZATION WITH CORONARY ANGIOGRAM;  Surgeon: Wellington Hampshire, MD;  Location: [facility];  Service: Cardiovascular;  Laterality: N/A;  . RIGHT/LEFT HEART CATH AND CORONARY ANGIOGRAPHY N/A 05/16/2017   Procedure: RIGHT/LEFT HEART CATH AND CORONARY ANGIOGRAPHY;  Surgeon: Nelva Bush, MD;  Location: [facility];  Service: Cardiovascular;  Laterality: N/A;    Family History  Problem Relation Age of Onset  . Heart disease Father   . Heart failure Father   . Hypertension Mother   . Hyperlipidemia Mother     Social History   Socioeconomic History  . Marital status: Married    Spouse name: Not on file  . Number of children: Not on file  . Years of education: Not on file  . Highest education level: Not on file  Social Needs  . Financial resource strain: Not on file  . Food insecurity - worry: Not on file  . Food insecurity - inability: Not on file  . Transportation needs - medical: Not on file  . Transportation needs - non-medical: Not on file  Occupational History  . Not on file  Tobacco Use  . Smoking status: Former Smoker    Years: 0.00    Types: Cigarettes  . Smokeless tobacco: Former Systems developer    Types: Snuff  Substance and Sexual Activity  . Alcohol use: Yes    Comment: occasional Beer   . Drug use: No  . Sexual activity: Not on file  Other Topics Concern  . Not on file  Social History Narrative  . Not on file    Current Outpatient Medications  Medication Sig Dispense Refill  . albuterol (PROAIR HFA) 108 (90 BASE) MCG/ACT inhaler Inhale 2 puffs into the lungs every 6 (six) hours as needed for wheezing  or shortness of breath.    Marland Kitchen aspirin 81 MG tablet Take 81 mg by mouth daily.    Marland Kitchen atorvastatin (LIPITOR) 40 MG tablet Take 40 mg by mouth daily.    . carvedilol (COREG) 3.125 MG tablet Take 1 tablet (3.125 mg total) by mouth 2 (two) times daily. 60 tablet 3  . digoxin (LANOXIN) 0.125 MG tablet Take 1 tablet (0.125 mg total) by mouth daily. 90 tablet 3  . Fluticasone-Salmeterol (ADVAIR) 250-50 MCG/DOSE AEPB Inhale 1 puff into the lungs 2 (two) times daily.    . ivabradine (CORLANOR) 5 MG TABS tablet Take 1 tablet (5 mg total) by mouth 2 (two) times daily with a meal. 60 tablet 3  . losartan (COZAAR) 25 MG tablet Take 1 tablet (25 mg total) by mouth daily. 30 tablet 3  . omeprazole (PRILOSEC) 20 MG capsule Take 20 mg by mouth daily.    Marland Kitchen spironolactone (ALDACTONE) 25 MG tablet Take 1 tablet (25 mg total) by mouth daily. 30 tablet 0  . tamsulosin (FLOMAX) 0.4 MG CAPS capsule Take 0.4 mg by mouth daily.    Marland Kitchen tiotropium (SPIRIVA) 18 MCG inhalation capsule Place 18 mcg into  inhaler and inhale daily.     No current facility-administered medications for this visit.     Allergies  Allergen Reactions  . Dobutamine     Noted during stress echo per patient and daughter  . Amlodipine Other (See Comments)    unknown  . Lisinopril Hives       . Metoprolol Nausea And Vomiting    Nausea and vomiting       Review of Systems:   General:  normal appetite, normal energy, no weight gain, no weight loss, no fever  Cardiac:  + chest pain with exertion, no chest pain at rest, + SOB with exertion, no resting SOB, no PND, no orthopnea, no palpitations, no arrhythmia, no atrial fibrillation, no LE edema, no dizzy spells, no syncope  Respiratory:  + shortness of breath, + home oxygen, no productive cough, occasional dry cough, recent bronchitis, no wheezing, no hemoptysis, no asthma, no pain with inspiration or cough, no sleep apnea, no CPAP at night  GI:   no difficulty swallowing, no reflux, no frequent  heartburn, no hiatal hernia, no abdominal pain, no constipation, no diarrhea, no hematochezia, no hematemesis, no melena  GU:   no dysuria,  no frequency, no urinary tract infection, no hematuria, no enlarged prostate, no kidney stones, no kidney disease  Vascular:  no pain suggestive of claudication, no pain in feet, no leg cramps, no varicose veins, no DVT, no non-healing foot ulcer  Neuro:   no stroke, no TIA's, no seizures, no headaches, no temporary blindness one eye,  no slurred speech, no peripheral neuropathy, no chronic pain, no instability of gait, no memory/cognitive dysfunction  Musculoskeletal: no arthritis, no joint swelling, no myalgias, no difficulty walking, normal mobility   Skin:   no rash, no itching, no skin infections, no pressure sores or ulcerations  Psych:   no anxiety, no depression, no nervousness, no unusual recent stress  Eyes:   no blurry vision, no floaters, no recent vision changes, + wears glasses or contacts  ENT:   + hearing loss, no loose or painful teeth, no dentures, last saw dentist 6 months ago  Hematologic:  + easy bruising, no abnormal bleeding, no clotting disorder, no frequent epistaxis  Endocrine:  no diabetes, does not check CBG's at home           Physical Exam:   BP 122/85 (BP Location: Left Arm, Patient Position: Sitting, Cuff Size: Large)   Pulse 89   Resp 20   Ht 5' 10"  (1.778 m)   Wt 205 lb (93 kg)   SpO2 93% Comment: RA  BMI 29.41 kg/m   General:  Elderly but  well-appearing  HEENT:  Unremarkable   Neck:   no JVD, no bruits, no adenopathy   Chest:   clear to auscultation, symmetrical breath sounds, no wheezes, no rhonchi   CV:   RRR, grade III/VI crescendo/decrescendo murmur heard best at RSB,  no diastolic murmur  Abdomen:  soft, non-tender, no masses   Extremities:  warm, well-perfused, pulses diminished but palpable, no LE edema  Rectal/GU  Deferred  Neuro:   Grossly non-focal and symmetrical throughout  Skin:   Clean and  dry, no rashes, no breakdown   Diagnostic Tests:  Transthoracic Echocardiography  Patient:    Daril, Warga MR #:       [id number] Study Date: 09/10/2017 Gender:     M Age:        5 Height:     177.8 cm Weight:  92.7 kg BSA:        2.16 m^2 Pt. 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  ------------------------------------------------------------------- History:   PMH:  s/p VF, hyperlipidemia.  Chronic obstructive pulmonary disease.  Risk factors:  Hypertension.  ------------------------------------------------------------------- Study Conclusions  - Left ventricle: The cavity size was normal. Wall thickness was   normal. Doppler parameters are consistent with abnormal left   ventricular relaxation (grade 1 diastolic dysfunction). The E/e&'   ratio is between 8-15, suggesting indeterminate LV filling   pressure. - Aortic valve: Calcified. Severe stenosis and trivial   regurgitation. There was mild regurgitation. Mean gradient (S):   31 mm Hg. Peak gradient (S): 50 mm Hg. Valve area (VTI): 0.82   cm^2. Valve area (Vmax): 0.84 cm^2. Valve area (Vmean): 0.72   cm^2. - Mitral valve: Calcified annulus. Mildly thickened leaflets .   There was trivial regurgitation. - Left atrium: The atrium was normal in size. - Right ventricle: Pacer wire or catheter noted in right ventricle. - Right atrium: The atrium was normal in size. Pacer wire or   catheter noted in right atrium. - Tricuspid valve: There was mild regurgitation. - Pulmonary arteries: PA peak pressure: 29 mm Hg (S). - Inferior vena cava: The vessel was normal in  size. The   respirophasic diameter changes were in the normal range (= 50%),   consistent with normal central venous pressure.  Impressions:  - Compared to a prior study in 04/2017, the LVEF is higher at 40-45%   with global hypokinesis. There is now severe aortic stenosis with   mean gradient of 31 mmHg and peak gradient of 50 mmHg -   calculated AVA of 0.8 cm2.  ------------------------------------------------------------------- Study data:  Comparison was made to the study of 04/14/2017.  Study status:  Routine.  Procedure:  The patient reported no pain pre or post test. Transthoracic echocardiography. Image quality was adequate.          Transthoracic echocardiography.  M-mode, complete 2D, spectral Doppler, and color Doppler.  Birthdate: Patient birthdate: September 25, 1940.  Age:  Patient is 77 yr old.  Sex: Gender: male.    BMI: 29.3 kg/m^2.  Blood pressure:     128/72 Patient status:  Outpatient.  Study date:  Study date: 09/10/2017. Study time: 02:42 PM.  Location:  [facility]  -------------------------------------------------------------------  ------------------------------------------------------------------- Left ventricle:  The cavity size was normal. Wall thickness was normal. Doppler parameters are consistent with abnormal left ventricular relaxation (grade 1 diastolic dysfunction). The E/e&' ratio is between 8-15, suggesting indeterminate LV filling pressure.  ------------------------------------------------------------------- Aortic valve:  Calcified. Severe stenosis and trivial regurgitation.  Doppler:  There was mild regurgitation.    VTI ratio of LVOT to aortic valve: 0.24. Valve area (VTI): 0.82 cm^2. Indexed valve area (VTI): 0.38 cm^2/m^2. Peak velocity ratio of LVOT to aortic valve: 0.24. Valve area (Vmax): 0.84 cm^2. Indexed valve area (Vmax): 0.39 cm^2/m^2. Mean velocity ratio of LVOT to aortic valve: 0.21. Valve area (Vmean): 0.72 cm^2. Indexed  valve area (Vmean): 0.33 cm^2/m^2.    Mean gradient (S): 31 mm Hg. Peak gradient (S): 50 mm Hg.  ------------------------------------------------------------------- Aorta:  Aortic root: The aortic root was  normal in size. Ascending aorta: The ascending aorta was normal in size.  ------------------------------------------------------------------- Mitral valve:   Calcified annulus. Mildly thickened leaflets . Doppler:  There was trivial regurgitation.    Valve area by pressure half-time: 3.06 cm^2. Indexed valve area by pressure half-time: 1.42 cm^2/m^2.    Peak gradient (D): 3 mm Hg.  ------------------------------------------------------------------- Left atrium:  The atrium was normal in size.  ------------------------------------------------------------------- Atrial septum:  No defect or patent foramen ovale was identified.   ------------------------------------------------------------------- Right ventricle:  The cavity size was normal. Wall thickness was normal. Pacer wire or catheter noted in right ventricle. Systolic function was normal.  ------------------------------------------------------------------- Pulmonic valve:    The valve appears to be grossly normal. Doppler:  There was no significant regurgitation.  ------------------------------------------------------------------- Tricuspid valve:   Doppler:  There was mild regurgitation.  ------------------------------------------------------------------- Pulmonary artery:   The main pulmonary artery was normal-sized.  ------------------------------------------------------------------- Right atrium:  The atrium was normal in size. Pacer wire or catheter noted in right atrium.  ------------------------------------------------------------------- Pericardium:  There was no pericardial effusion.  ------------------------------------------------------------------- Systemic veins: Inferior vena cava: The vessel  was normal in size. The respirophasic diameter changes were in the normal range (= 50%), consistent with normal central venous pressure. Diameter: 15 mm.  ------------------------------------------------------------------- Measurements   IVC                                      Value          Reference  ID                                       15    mm       ----------    Left ventricle                           Value          Reference  LV ID, ED, PLAX chordal                  51    mm       43 - 52  LV ID, ES, PLAX chordal          (H)     41    mm       23 - 38  LV fx shortening, PLAX chordal   (L)     20    %        >=29  LV PW thickness, ED                      12    mm       ----------  IVS/LV PW ratio, ED                      0.75           <=1.3  Stroke volume, 2D                        68    ml       ----------  Stroke volume/bsa, 2D                    31  ml/m^2   ----------  LV end-diastolic volume, 1-p B7J         170   ml       ----------  LV end-systolic volume, 1-p I9C          104   ml       ----------  LV end-diastolic volume, 1-p V8L         209   ml       ----------  LV end-systolic volume, 1-p F8B          116   ml       ----------  LV ejection fraction, 1-p A4C            45    %        ----------  Stroke volume, 1-p A4C                   93    ml       ----------  LV end-diastolic volume/bsa, 1-p         97    ml/m^2   ----------  O1B  LV end-systolic volume/bsa, 1-p          53    ml/m^2   ----------  A4C  Stroke volume/bsa, 1-p A4C               43    ml/m^2   ----------  LV end-diastolic volume, 2-p             189   ml       ----------  LV end-systolic volume, 2-p              113   ml       ----------  LV ejection fraction, 2-p                40    %        ----------  Stroke volume, 2-p                       76    ml       ----------  LV end-diastolic volume/bsa, 2-p         87    ml/m^2   ----------  LV end-systolic volume/bsa, 2-p          52     ml/m^2   ----------  Stroke volume/bsa, 2-p                   35.2  ml/m^2   ----------  LV e&', lateral                           6.42  cm/s     ----------  LV E/e&', lateral                         12.32          ----------  LV e&', medial                            5.44  cm/s     ----------  LV E/e&', medial                          14.54          ----------  LV e&', average                           5.93  cm/s     ----------  LV E/e&', average                         13.34          ----------    Ventricular septum                       Value          Reference  IVS thickness, ED                        9     mm       ----------    LVOT                                     Value          Reference  LVOT ID, S                               21    mm       ----------  LVOT area                                3.46  cm^2     ----------  LVOT peak velocity, S                    86.1  cm/s     ----------  LVOT mean velocity, S                    55    cm/s     ----------  LVOT VTI, S                              19.7  cm       ----------    Aortic valve                             Value          Reference  Aortic valve peak velocity, S            354   cm/s     ----------  Aortic valve mean velocity, S            266   cm/s     ----------  Aortic valve VTI, S                      83.1  cm       ----------  Aortic mean gradient, S                  31    mm Hg    ----------  Aortic peak gradient, S                  50    mm Hg    ----------  VTI  ratio, LVOT/AV                       0.24           ----------  Aortic valve area, VTI                   0.82  cm^2     ----------  Aortic valve area/bsa, VTI               0.38  cm^2/m^2 ----------  Velocity ratio, peak, LVOT/AV            0.24           ----------  Aortic valve area, peak velocity         0.84  cm^2     ----------  Aortic valve area/bsa, peak              0.39  cm^2/m^2 ----------  velocity  Velocity ratio, mean, LVOT/AV             0.21           ----------  Aortic valve area, mean velocity         0.72  cm^2     ----------  Aortic valve area/bsa, mean              0.33  cm^2/m^2 ----------  velocity  Aortic regurg pressure half-time         416   ms       ----------    Aorta                                    Value          Reference  Aortic root ID, ED                       31    mm       ----------    Left atrium                              Value          Reference  LA ID, A-P, ES                           37    mm       ----------  LA ID/bsa, A-P                           1.71  cm/m^2   <=2.2  LA volume, S                             54.1  ml       ----------  LA volume/bsa, S                         25    ml/m^2   ----------  LA volume, ES, 1-p A4C                   52.2  ml       ----------  LA volume/bsa, ES, 1-p A4C  24.1  ml/m^2   ----------  LA volume, ES, 1-p A2C                   51.6  ml       ----------  LA volume/bsa, ES, 1-p A2C               23.9  ml/m^2   ----------    Mitral valve                             Value          Reference  Mitral E-wave peak velocity              79.1  cm/s     ----------  Mitral A-wave peak velocity              83.9  cm/s     ----------  Mitral deceleration time         (H)     246   ms       150 - 230  Mitral pressure half-time                72    ms       ----------  Mitral peak gradient, D                  3     mm Hg    ----------  Mitral E/A ratio, peak                   0.9            ----------  Mitral valve area, PHT, DP               3.06  cm^2     ----------  Mitral valve area/bsa, PHT, DP           1.42  cm^2/m^2 ----------    Pulmonary arteries                       Value          Reference  PA pressure, S, DP                       29    mm Hg    <=30    Tricuspid valve                          Value          Reference  Tricuspid regurg peak velocity           256   cm/s     ----------  Tricuspid peak RV-RA gradient            26    mm  Hg    ----------  Tricuspid maximal regurg                 256   cm/s     ----------  velocity, PISA    Right atrium                             Value          Reference  RA ID, S-I, ES, A4C              (  H)     52.3  mm       34 - 49  RA area, ES, A4C                         13.4  cm^2     8.3 - 19.5  RA volume, ES, A/L                       27.4  ml       ----------  RA volume/bsa, ES, A/L                   12.7  ml/m^2   ----------    Systemic veins                           Value          Reference  Estimated CVP                            3     mm Hg    ----------    Right ventricle                          Value          Reference  TAPSE                                    30    mm       ----------  RV pressure, S, DP                       29    mm Hg    <=30  RV s&', lateral, S                        10.2  cm/s     ----------  Legend: (L)  and  (H)  mark values outside specified reference range.  ------------------------------------------------------------------- Prepared and Electronically Authenticated by  Lyman Bishop MD 2018-12-19T17:14:01   RIGHT/LEFT HEART CATH AND CORONARY ANGIOGRAPHY  Conclusion   Conclusions: 1. No angiographically significant coronary artery disease. 2. Mildly elevated left heart filling pressures. 3. Moderately elevated right heart filling pressure. 4. Mild pulmonary hypertension. 5. Low normal Fick cardiac output. Normal to high thermal dilutional cardiac output (I suspect this is artifactually high). 6. Likely moderate aortic stenosis with mean aortic valve gradient of 20 mmHg, calculated valve area of ~1.4 cm^2. Aortic valve was crossed without difficulty.  Recommendations: 1. Continue medical therapy of chronic systolic heart failure secondary to nonischemic cardiomyopathy and moderate aortic stenosis.  Nelva Bush, MD [facility] Pager: [phone]   Indications   Nonrheumatic aortic valve stenosis [I35.0  (ICD-10-CM)]  Ventricular fibrillation (HCC) [I49.01 ([id number])]  Chronic systolic heart failure (HCC) [I50.22 (ICD-10-CM)]  Procedural Details/Technique   Technical Details Indication: 77 y.o. year-old man with history of chronic systolic heart failure secondary to nonischemic cardiomyopathy, moderate aortic stenosis, and COPD, admitted after ventricular fibrillation arrest during dobutamine stress echo yesterday. Patient was successfully resuscitated with CPR and defibrillation 1. He has been referred for left and right heart catheterization to exclude coronary disease in the setting of VF as well as to  further assess his degree of aortic stenosis.  GFR: >60 ml/min  Procedure: The risks, benefits, complications, treatment options, and expected outcomes were discussed with the patient. The patient and/or family concurred with the proposed plan, giving informed consent. The patient was brought to the cath lab after IV hydration was begun and oral premedication was given. The patient was further sedated with Versed and Fentanyl. The right groin was prepped and draped in the usual manner. Using the modified Seldinger access technique and a 38F micropuncture kit, 54F and 36F sheaths were placed in the right femoral vein and artery, respectively.  Right heart catheterization was performed by advancing a 54F balloon-tipped catheter through the right heart chambers and into the pulmonary capillary wedge position. Pressure measurements and oxygen saturations were obtained.  Selective coronary angiography was performed using 38F JL4 and JR4 catheters to engage the left and right coronary arteries, respectively. The aortic valve was crossed with a 0.035"J-wire and JR4 catheter. The JR4 catheter was exchanged for a dual lumen pigtail catheter, allowing simultaneous measurement of left heart and aortic pressures. Left ventriculogram was not performed.  There were no immediate complications. The patient was taken to  the recovery area in stable condition.   Contrast used: 40 mL Isovue Fluoroscopy time: 9.1 min Radiation dose: 295 mGy   Estimated blood loss <50 mL.  During this procedure the patient was administered the following to achieve and maintain moderate conscious sedation: Versed 1 mg, Fentanyl 25 mcg, while the patient's heart rate, blood pressure, and oxygen saturation were continuously monitored. The period of conscious sedation was 50 minutes, of which I was present face-to-face 100% of this time.  Complications   Complications documented before study signed (05/16/2017 5:03 PM EDT)    No complications were associated with this study.  Documented by Nelva Bush, MD - 05/16/2017 5:01 PM EDT    Coronary Findings   Diagnostic  Dominance: Right  Left Main  Vessel is large. Vessel is angiographically normal.  Left Anterior Descending  Vessel is large. Vessel is angiographically normal.  First Diagonal Branch  Vessel is moderate in size.  Left Circumflex  Vessel is large. Vessel is angiographically normal.  First Obtuse Marginal Branch  Vessel is small in size.  Second Obtuse Marginal Branch  Vessel is moderate in size.  Third Obtuse Marginal Branch  Vessel is moderate in size.  Right Coronary Artery  Vessel is large. Vessel is angiographically normal.  Right Posterior Descending Artery  Vessel is moderate in size.  Right Posterior Atrioventricular Branch  Vessel is moderate in size.  Intervention   No interventions have been documented.  Right Heart   Right Heart Pressures RA (mean): 12 mmHg RV (S/EDP): 51/14 mmHg PA (S/D, mean): 45/25 (32) mmHg PCWP (mean): 20 mmHg  Ao sat: 95% PA sat: 67%  Fick CO: 5.3 L/min Fick CI: 2.6 L/min/m^2  Thermodilution CO: 11.0 L/min Thermodilution CI: 5.3 L/min/m^2  Left Heart   Left Ventricle LV end diastolic pressure is mildly elevated. LVEDP 20 mmHg.  Aortic Valve There is moderate aortic valve stenosis. Mean aortic valve  gradient: 20 mmHg. Peak-to-peak aortic valve gradient: 16 mmHg. Calculated aortic valve area: 1.4 cm^2  Coronary Diagrams   Diagnostic Diagram       Implants     No implant documentation for this case.  MERGE Images   Show images for CARDIAC CATHETERIZATION   Link to Procedure Log   Procedure Log    Hemo Data    Most Recent Value  Fick Cardiac Output 5.32 L/min  Fick Cardiac Output Index 2.57 (L/min)/BSA  Thermal Cardiac Output 11 L/min  Thermal Cardiac Output Index 5.31 (L/min)/BSA  Aortic Mean Gradient 19.7 mmHg  Aortic Peak Gradient 16 mmHg  Aortic Valve Area 3.08  Aortic Value Area Index 1.49 cm2/BSA  RA A Wave 12 mmHg  RA V Wave 11 mmHg  RA Mean 10 mmHg  RV Systolic Pressure 45 mmHg  RV Diastolic Pressure 4 mmHg  RV EDP 11 mmHg  PA Systolic Pressure 37 mmHg  PA Diastolic Pressure 21 mmHg  PA Mean 27 mmHg  PW A Wave 17 mmHg  PW V Wave 18 mmHg  PW Mean 17 mmHg  AO Systolic Pressure 498 mmHg  AO Diastolic Pressure 52 mmHg  AO Mean 73 mmHg  LV Systolic Pressure 264 mmHg  LV Diastolic Pressure 10 mmHg  LV EDP 14 mmHg  Arterial Occlusion Pressure Extended Systolic Pressure 158 mmHg  Arterial Occlusion Pressure Extended Diastolic Pressure 53 mmHg  Arterial Occlusion Pressure Extended Mean Pressure 73 mmHg  Left Ventricular Apex Extended Systolic Pressure 309 mmHg  Left Ventricular Apex Extended Diastolic Pressure 10 mmHg  Left Ventricular Apex Extended EDP Pressure 17 mmHg  TPVR Index 5.08 HRUI  TSVR Index 13.93 HRUI  PVR SVR Ratio 0.16  TPVR/TSVR Ratio 0.36     Cardiac TAVR CT  TECHNIQUE: The patient was scanned on a Siemens Force 407 slice scanner. A 120 kV retrospective scan was triggered in the ascending thoracic aorta at 140 HU's. Gantry rotation speed was 250 msecs and collimation was .6 mm. No beta blockade or nitro were given. The 3D data set was reconstructed in 5% intervals of the R-R cycle. Systolic and diastolic phases were analyzed on  a dedicated work station using MPR, MIP and VRT modes. The patient received 80 cc of contrast.  FINDINGS: Aortic Valve: Tri leaflet and calcified with restricted motion  Aorta: Normal diameter with normal arch vessel origins Mild calcific atherosclerosis  Sino-tubular Junction: 27 mm  Ascending Thoracic Aorta: 30 mm  Aortic Arch: 28 mm  Descending Thoracic Aorta: 26 mm  Sinus of Valsalva Measurements:  Non-coronary: 30.5 mm  Right - coronary: 30.5 mm  Left -   coronary: 31 mm  Coronary Artery Height above Annulus:  Left Main: 17 mm above annulus  Right Coronary: 14.4 mm above annulus  Virtual Basal Annulus Measurements:  Maximum / Minimum Diameter: 27.6 mm x 21.5 mm  Perimeter: 79.5 mm  Area: 463.5 mm2  Coronary Arteries: Sufficient height above annulus for deployment  Optimum Fluoroscopic Angle for Delivery: RAO 8 degrees Caudal 11 degrees  IMPRESSION: 1. Calcified tri leaflet aortic valve with annular area of 463 mm2 suitable for a 26 mm Sapien 3 valve and perimeter 79.5 suitable for a 29 mm Evolut Pro valve  2.  Coronary arteries sufficient height above annulus for deployment  3. Optimum angiographic angle for deployment RAO 8 degrees Caudal 11 degrees  Jenkins Rouge   Electronically Signed   By: Jenkins Rouge M.D.   On: 10/03/2017 14:10   CT ANGIOGRAPHY CHEST, ABDOMEN AND PELVIS  TECHNIQUE: Multidetector CT imaging through the chest, abdomen and pelvis was performed using the standard protocol during bolus administration of intravenous contrast. Multiplanar reconstructed images and MIPs were obtained and reviewed to evaluate the vascular anatomy.  CONTRAST:  162m ISOVUE-370 IOPAMIDOL (ISOVUE-370) INJECTION 76%  COMPARISON:  03/16/2014 chest CT. 04/07/2014 unenhanced CT abdomen/pelvis.  FINDINGS: CTA CHEST FINDINGS  Cardiovascular: Top-normal heart size. No significant pericardial  fluid/thickening. Severe  thickening and calcification of the aortic valve. Left main, left anterior descending and left circumflex coronary atherosclerosis. Three lead left subclavian ICD is noted with lead tips in the right atrium, right ventricular apex and coronary sinus. Atherosclerotic minimally tortuous nonaneurysmal thoracic aorta. No evidence of acute intramural hematoma, dissection, pseudoaneurysm or penetrating atherosclerotic ulcer in the thoracic aorta. Aortic arch vessels are patent. Normal caliber pulmonary arteries. No central pulmonary emboli.  Mediastinum/Nodes: No discrete thyroid nodules. Unremarkable esophagus. No pathologically enlarged axillary, mediastinal or hilar lymph nodes.  Lungs/Pleura: No pneumothorax. No pleural effusion. Mild centrilobular emphysema with mild diffuse bronchial wall thickening. No acute consolidative airspace disease, lung masses or significant pulmonary nodules. Scattered small parenchymal bands in the mid to lower lungs bilaterally, compatible with mild postinfectious/postinflammatory scarring.  Musculoskeletal: No aggressive appearing focal osseous lesions. Mild thoracic spondylosis. Nonspecific patchy sclerosis in the mid sternum (series 17/image 96), new since 03/16/2014 chest CT, without appreciable discrete lesion in this location. Mild gynecomastia, asymmetric to the left, new.  CTA ABDOMEN AND PELVIS FINDINGS  Hepatobiliary: Normal liver size. Heterogeneously enhancing 0.9 cm posterior right liver dome lesion (series 14/image 95) correlates with a hypodense 0.9 cm lesion in this location on the 04/07/2014 unenhanced CT study, compatible with a benign lesion such as a hemangioma. Otherwise no liver lesions. Normal gallbladder with no radiopaque cholelithiasis. No biliary ductal dilatation.  Pancreas: Normal, with no mass or duct dilation.  Spleen: Normal size. No mass.  Adrenals/Urinary Tract: Normal adrenals. Simple 1.2 cm upper  left renal cyst. No additional contour deforming renal lesions. No hydronephrosis. Normal bladder.  Stomach/Bowel: Normal non-distended stomach. Normal caliber small bowel with no small bowel wall thickening. Normal appendix. Moderate left colonic diverticulosis, most prominent in the sigmoid colon, with no large bowel wall thickening or pericolonic fat stranding.  Vascular/Lymphatic: Atherosclerotic nonaneurysmal abdominal aorta. Patent splenic and renal veins. No pathologically enlarged lymph nodes in the abdomen or pelvis.  Reproductive: Mildly enlarged prostate with nonspecific coarse internal prostatic calcifications.  Other: No pneumoperitoneum, ascites or focal fluid collection.  Musculoskeletal: No aggressive appearing focal osseous lesions. Marked lumbar spondylosis.  VASCULAR MEASUREMENTS PERTINENT TO TAVR:  AORTA:  Minimal Aortic Diameter-17.2 x 16.4 mm (infrarenal abdominal aorta on series 13/image 441)  Severity of Aortic Calcification-moderate  RIGHT PELVIS:  Right Common Iliac Artery -  Minimal Diameter-9.0 x 8.3 mm  Tortuosity-mild  Calcification-moderate  Right External Iliac Artery -  Minimal Diameter-7.1 x 6.5 mm  Tortuosity-severe  Calcification-none  Right Common Femoral Artery -  Minimal Diameter-7.6 x 7.2 mm  Tortuosity-mild  Calcification-none  LEFT PELVIS:  Left Common Iliac Artery -  Minimal Diameter-9.6 x 9.0 mm  Tortuosity-moderate  Calcification-mild  Left External Iliac Artery -  Minimal Diameter-8.0 x 7.4 mm  Tortuosity-moderate  Calcification-none  Left Common Femoral Artery -  Minimal Diameter-7.6 x 7.6 mm  Tortuosity-mild  Calcification-mild  Review of the MIP images confirms the above findings.  IMPRESSION: 1. Vascular findings and measurements pertinent to potential TAVR procedure, as detailed above. 2. Severe thickening and calcification of the aortic  valve, compatible with the reported clinical history of severe aortic stenosis. 3. Left main and two-vessel coronary atherosclerosis. 4. Mild centrilobular emphysema with mild diffuse bronchial wall thickening, suggesting COPD. 5. Aortic Atherosclerosis (ICD10-I70.0) and Emphysema (ICD10-J43.9). 6. Moderate left colonic diverticulosis. 7. Mildly enlarged prostate.   Electronically Signed   By: Ilona Sorrel M.D.   On: 10/03/2017 16:17   Pulmonary Function Tests  Baseline  Post-bronchodilator  FVC  2.03 L  (49% predicted) FVC  2.41 L  (58% predicted) FEV1  0.93 L  (31% predicted) FEV1  1.07 L  (36% predicted) FEF25-75 0.36 L  (17% predicted) FEF25-75 0.5 L  (24% predicted)  TLC  7.71 L  (109% predicted) RV  5.20 L  (197% predicted) DLCO  46% predicted   STS Risk Calculator Procedure: Isolated AVR CALCULATE  Risk of Mortality:  3.455%   Renal Failure:  2.010%   Permanent Stroke:  0.725%   Prolonged Ventilation:  15.972%   DSW Infection:  0.141%   Reoperation:  4.607%   Morbidity or Mortality:  21.299%   Short Length of Stay:  20.073%   Long Length of Stay:  11.719%      Impression:  Patient has 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  He describes a long-standing history of progressive symptoms of exertional shortness of breath consistent with chronic combined systolic and diastolic congestive heart failure New [place] Heart Association functional class II.  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  I have personally reviewed the patient's recent transthoracic echocardiograms, diagnostic cardiac catheterization, and CT angiograms.  Echocardiograms confirmed the presence of low gradient low ejection fraction severe aortic stenosis.  The aortic valve is trileaflet with severe thickening, calcification, and  restricted leaflet mobility involving all 3 leaflets.  Peak velocity across the aortic valve is ranged between 3.0 and 3.6 m/s corresponding to mean transvalvular gradient estimated at size 30 mmHg in the setting of underlying severe left ventricular systolic dysfunction.  The patient suffered a VF arrest during dobutamine stress echocardiography last August.  I do not think there is any question that he would benefit from elective aortic valve replacement.  Diagnostic cardiac catheterization is notable for the absence of significant coronary artery disease.  Risks associated with conventional surgery would be at least moderately elevated because of the patient's age, left ventricular systolic dysfunction, and underlying severe COPD.  Cardiac-gated CTA of the heart reveals anatomical characteristics consistent with aortic stenosis suitable for treatment by transcatheter aortic valve replacement without any significant complicating features and CTA of the aorta and iliac vessels demonstrate what appears to be adequate pelvic vascular access to facilitate a transfemoral approach.    Plan:  The patient and his daughter were counseled at length regarding treatment alternatives for management of severe symptomatic aortic stenosis. Alternative approaches such as conventional aortic valve replacement, transcatheter aortic valve replacement, and continued medical therapy were compared and contrasted at length.  The risks associated with conventional surgical aortic valve replacement were been discussed in detail, as were expectations for post-operative convalescence, and why I would be reluctant to consider this patient a candidate for conventional surgery.  Issues specific to transcatheter aortic valve replacement were discussed including questions about long term valve durability, the potential for paravalvular leak, possible increased risk of need for permanent pacemaker placement, and other technical complications  related to the procedure itself.  Long-term prognosis with medical therapy was discussed. This discussion was placed in the context of the patient's own specific clinical presentation and past medical history.  All of their questions been addressed.  The patient hopes to proceed with transcatheter aortic valve replacement in the near future.  Following the decision to proceed with transcatheter aortic valve replacement, a discussion has been held regarding what types of management strategies would be  attempted intraoperatively in the event of life-threatening complications, including whether or not the patient would be considered a candidate for the use of cardiopulmonary bypass and/or conversion to open sternotomy for attempted surgical intervention.  The patient has been advised of a variety of complications that might develop including but not limited to risks of death, stroke, paravalvular leak, aortic dissection or other major vascular complications, aortic annulus rupture, device embolization, cardiac rupture or perforation, mitral regurgitation, acute myocardial infarction, arrhythmia, heart block or bradycardia requiring permanent pacemaker placement, congestive heart failure, respiratory failure, renal failure, pneumonia, infection, other late complications related to structural valve deterioration or migration, or other complications that might ultimately cause a temporary or permanent loss of functional independence or other long term morbidity.  The patient provides full informed consent for the procedure as described and all questions were answered.   I spent in excess of 90 minutes during the conduct of this office consultation and >50% of this time involved direct face-to-face encounter with the patient for counseling and/or coordination of their care.    Valentina Gu. Roxy Manns, MD 10/03/2017 3:22 PM

## 2017-10-03 NOTE — Patient Instructions (Signed)
  Continue taking all current medications without change through the day before surgery.  Have nothing to eat or drink after midnight the night before surgery.  On the morning of surgery take only Prilosec and Flomax with a sip of water.  You may use your inhalers.

## 2017-10-06 ENCOUNTER — Other Ambulatory Visit: Payer: Self-pay

## 2017-10-06 DIAGNOSIS — I350 Nonrheumatic aortic (valve) stenosis: Secondary | ICD-10-CM

## 2017-10-07 ENCOUNTER — Other Ambulatory Visit: Payer: Self-pay

## 2017-10-07 ENCOUNTER — Institutional Professional Consult (permissible substitution): Payer: Medicare Other | Admitting: Surgery

## 2017-10-07 ENCOUNTER — Encounter: Payer: Self-pay | Admitting: Surgery

## 2017-10-07 VITALS — BP 123/74 | HR 94 | Resp 20 | Ht 70.0 in | Wt 203.0 lb

## 2017-10-07 DIAGNOSIS — I350 Nonrheumatic aortic (valve) stenosis: Secondary | ICD-10-CM

## 2017-10-07 NOTE — Patient Instructions (Addendum)
You are scheduled for Pre Admission Testing at [facility] on Friday, October 31, 2017 at 3:00 PM. Please arrive in Admitting at [facility] at 2:45 PM for check in.  There are no restrictions for this appointment.  TAVR is scheduled on Tuesday, 11/04/2017, with Dr Evelene Croon and Dr [name]. 1. Nothing to eat or drink after midnight in preparation for surgery 2. Inhalers are okay to use the morning of surgery 3. Take only Prilosec and Flomax the morning of TAVR with a sip of water 4. Arrive at 8:00 AM for check in at Admitting, [facility]

## 2017-10-08 ENCOUNTER — Encounter: Payer: Self-pay | Admitting: Surgery

## 2017-10-08 NOTE — H&P (View-Only) (Signed)
Patient ID: Benjamin Frost, male   DOB: 01-18-1941, 77 y.o.   MRN: [id number]  [facility] REPORT  Referring Provider is Wellington Hampshire, MD PCP is Ruhl, Merri Ray, MD  Chief Complaint  Patient presents with  . New Patient (Initial Visit)    2nd TAVR evaluation    HPI:  The patient is a 77 year old gentleman with a history of hypertension, hyperlipidemia, severe COPD on home oxygen at night with a remote history of smoking, nonischemic cardiomyopathy with an ejection fraction as low as 15% by echocardiogram in 11/2013, left bundle branch block, and a history of aortic stenosis.  He has a long-standing history of exertional shortness of breath that has been attributed to a combination of severe COPD, moderate aortic stenosis, and nonischemic cardiomyopathy with severe left ventricular dysfunction.  For the past several years he has been followed by Dr. Fletcher Anon.  An echocardiogram in July 2018 showed an ejection fraction of 25-30%.  The peak velocity across aortic valve was 3.0 m/s with a mean transvalvular gradient of 20 mmHg.  He underwent a dobutamine stress test in an effort to determine if this was really low ejection fraction severe aortic stenosis.  Unfortunately he suffered a ventricular fibrillation arrest during the procedure but was promptly resuscitated and recovered uneventfully.  Cardiac catheterization the following day showed no significant coronary disease with mildly elevated filling pressures.  The mean transvalvular gradient across aortic valve was 20 mmHg with a valve area calculated at 1.4 cm.  He then underwent insertion of a biventricular pacemaker/defibrillator on 05/29/2017.  He feels like he improved somewhat with that although he continued to have significant exertional shortness of breath.  A follow-up echocardiogram afterwards showed an ejection fraction that had improved to 40-45% with  biventricular pacing.  There is felt to be severe aortic stenosis with a peak velocity across the aortic valve of 3.5 m/s with a transvalvular gradient of 31 mmHg.  The dimensionless index was 0.21 with a calculated aortic valve area of 0.72-0.84 cm.  He underwent CT angiography for consideration of TAVR and has been seen by Dr. Roxy Manns.  He is here with his wife today.  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  He continues to have exertional shortness of breath that has progressed.  It occurs with walking any distance or up inclines as well as with lifting.  He has not had much fatigue but is not doing that much activity lately.  He denies any dizziness or syncope.  He denies edema.  Past Medical History:  Diagnosis Date  . Aortic stenosis    a. mod-severe low flow low gradient AS.   Marland Kitchen Chronic back pain   . Chronic systolic heart failure (HCC)    Ejection fraction of 15%  . COPD (chronic obstructive pulmonary disease) ([facility])   . History of BPH   . Hyperlipidemia   . Hypertension   . Kidney stone   . Left bundle branch block   . Sinus tachycardia     Past Surgical History:  Procedure Laterality Date  . BIV ICD INSERTION CRT-D N/A 05/29/2017   SJM Quadra Assura implanted by Dr Rayann Heman for secondary prevention of sudden death  . CARDIAC CATHETERIZATION  2012   [facility]  . CARDIAC CATHETERIZATION     [facility]  .  LEFT AND RIGHT HEART CATHETERIZATION WITH CORONARY ANGIOGRAM N/A 03/23/2014   Procedure: LEFT AND RIGHT HEART CATHETERIZATION WITH CORONARY ANGIOGRAM;  Surgeon: Wellington Hampshire, MD;  Location: [facility];  Service: Cardiovascular;  Laterality: N/A;  . RIGHT/LEFT HEART CATH AND CORONARY ANGIOGRAPHY N/A 05/16/2017   Procedure: RIGHT/LEFT HEART CATH AND CORONARY ANGIOGRAPHY;  Surgeon: Nelva Bush, MD;  Location: [facility];  Service: Cardiovascular;  Laterality:  N/A;    Family History  Problem Relation Age of Onset  . Heart disease Father   . Heart failure Father   . Hypertension Mother   . Hyperlipidemia Mother     Social History   Socioeconomic History  . Marital status: Married    Spouse name: Not on file  . Number of children: Not on file  . Years of education: Not on file  . Highest education level: Not on file  Social Needs  . Financial resource strain: Not on file  . Food insecurity - worry: Not on file  . Food insecurity - inability: Not on file  . Transportation needs - medical: Not on file  . Transportation needs - non-medical: Not on file  Occupational History  . Not on file  Tobacco Use  . Smoking status: Former Smoker    Years: 0.00    Types: Cigarettes  . Smokeless tobacco: Former Systems developer    Types: Snuff  Substance and Sexual Activity  . Alcohol use: Yes    Comment: occasional Beer   . Drug use: No  . Sexual activity: Not on file  Other Topics Concern  . Not on file  Social History Narrative  . Not on file    Current Outpatient Medications  Medication Sig Dispense Refill  . albuterol (PROAIR HFA) 108 (90 BASE) MCG/ACT inhaler Inhale 2 puffs into the lungs every 6 (six) hours as needed for wheezing or shortness of breath.    Marland Kitchen aspirin 81 MG tablet Take 81 mg by mouth daily.    Marland Kitchen atorvastatin (LIPITOR) 40 MG tablet Take 40 mg by mouth daily.    . carvedilol (COREG) 3.125 MG tablet Take 1 tablet (3.125 mg total) by mouth 2 (two) times daily. 60 tablet 3  . digoxin (LANOXIN) 0.125 MG tablet Take 1 tablet (0.125 mg total) by mouth daily. 90 tablet 3  . Fluticasone-Salmeterol (ADVAIR) 250-50 MCG/DOSE AEPB Inhale 1 puff into the lungs 2 (two) times daily.    . ivabradine (CORLANOR) 5 MG TABS tablet Take 1 tablet (5 mg total) by mouth 2 (two) times daily with a meal. 60 tablet 3  . losartan (COZAAR) 25 MG tablet Take 1 tablet (25 mg total) by mouth daily. 30 tablet 3  . omeprazole (PRILOSEC) 20 MG capsule Take 20 mg  by mouth daily.    Marland Kitchen spironolactone (ALDACTONE) 25 MG tablet Take 1 tablet (25 mg total) by mouth daily. 30 tablet 0  . tamsulosin (FLOMAX) 0.4 MG CAPS capsule Take 0.4 mg by mouth daily.    Marland Kitchen tiotropium (SPIRIVA) 18 MCG inhalation capsule Place 18 mcg into inhaler and inhale daily.     No current facility-administered medications for this visit.     Allergies  Allergen Reactions  . Dobutamine     Noted during stress echo per patient and daughter  . Amlodipine Other (See Comments)    unknown  . Lisinopril Hives       . Metoprolol Nausea And Vomiting    Nausea and vomiting       Review  of Systems:   General:  normal appetite, normal energy,  weight gain, no weight loss, no fever  Cardiac:  Has had chest pain with exertion, no chest pain at rest, hasSOB with moderate exertion, no resting SOB, no PND, no orthopnea, no palpitations, no arrhythmia, no atrial fibrillation, no LE edema, no dizzy spells, no syncope  Respiratory:  has shortness of breath, uses home oxygen, no productive cough, no dry cough, had recent bronchitis, no wheezing, no hemoptysis, no asthma, no pain with inspiration or cough, no sleep apnea, no CPAP at night  GI:   no difficulty swallowing, no reflux, no frequent heartburn, no hiatal hernia, no abdominal pain, no constipation, no diarrhea, no hematochezia, no hematemesis, no melena  GU:   no dysuria,  no frequency, no urinary tract infection, no hematuria, no enlarged prostate, no kidney stones, no kidney disease  Vascular:  no pain suggestive of claudication, no pain in feet, no leg cramps, no varicose veins, no DVT, no non-healing foot ulcer  Neuro:   no stroke, no TIA's, no seizures, no headaches, no temporary blindness one eye,  no slurred speech, no peripheral neuropathy, no chronic pain, no instability of gait, no memory/cognitive dysfunction  Musculoskeletal: no arthritis, no joint swelling, no myalgias, no difficulty walking, normal mobility   Skin:   no  rash, no itching, no skin infections, no pressure sores or ulcerations  Psych:   no anxiety, no depression, no nervousness, no unusual recent stress  Eyes:   no blurry vision, no floaters, no recent vision changes, he wears glasses or contacts  ENT:   has hearing loss, no loose or painful teeth, no dentures, last saw dentist 6 months ago  Hematologic:  has easy bruising, no abnormal bleeding, no clotting disorder, no frequent epistaxis  Endocrine:  no diabetes, does not check CBG's at home      Physical Exam:   BP 123/74 (BP Location: Right Arm, Patient Position: Sitting, Cuff Size: Large)   Pulse 94   Resp 20   Ht 5' 10"  (1.778 m)   Wt 203 lb (92.1 kg)   SpO2 92% Comment: RA  BMI 29.13 kg/m   General:  Elderly but  well-appearing  HEENT:  Unremarkable, NCAT, PERLA, EOMI, oropharynx clear  Neck:   no JVD, no bruits, no adenopathy or thyromegaly  Chest:   clear to auscultation, symmetrical breath sounds, no wheezes, no rhonchi   CV:   RRR, grade III/VI crescendo/decrescendo murmur heard best at RSB,  no diastolic murmur  Abdomen:  soft, non-tender, no masses or organomegaly  Extremities:  warm, well-perfused, pulses palpable, no LE edema  Rectal/GU  Deferred  Neuro:   Grossly non-focal and symmetrical throughout  Skin:   Clean and dry, no rashes, no breakdown   Diagnostic Tests:  Result status: Final result                           [facility]                        [address], [place] [address]                            [phone]  ------------------------------------------------------------------- Transthoracic Echocardiography  Patient:  Benjamin Frost, Benjamin Frost MR #:       [id number] Study Date: 09/10/2017 Gender:     M Age:        74 Height:     177.8 cm Weight:     92.7 kg BSA:        2.16 m^2 Pt. 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  ------------------------------------------------------------------- History:   PMH:  s/p VF, hyperlipidemia.  Chronic obstructive pulmonary disease.  Risk factors:  Hypertension.  ------------------------------------------------------------------- Study Conclusions  - Left ventricle: The cavity size was normal. Wall thickness was   normal. Doppler parameters are consistent with abnormal left   ventricular relaxation (grade 1 diastolic dysfunction). The E/e&'   ratio is between 8-15, suggesting indeterminate LV filling   pressure. - Aortic valve: Calcified. Severe stenosis and trivial   regurgitation. There was mild regurgitation. Mean gradient (S):   31 mm Hg. Peak gradient (S): 50 mm Hg. Valve area (VTI): 0.82   cm^2. Valve area (Vmax): 0.84 cm^2. Valve area (Vmean): 0.72   cm^2. - Mitral valve: Calcified annulus. Mildly thickened leaflets .   There was trivial regurgitation. - Left atrium: The atrium was normal in size. - Right ventricle: Pacer wire or catheter noted in right ventricle. - Right atrium: The atrium was normal in size. Pacer wire or   catheter noted in right atrium. - Tricuspid valve: There was mild regurgitation. - Pulmonary arteries: PA peak pressure: 29 mm Hg (S). - Inferior vena cava: The vessel was normal in size. The   respirophasic diameter changes were in the normal range (= 50%),   consistent with normal central venous pressure.  Impressions:  - Compared to a prior study in 04/2017, the LVEF is higher at 40-45%   with global hypokinesis. There is now severe aortic stenosis with   mean gradient of 31 mmHg and peak gradient of 50 mmHg -   calculated AVA of 0.8  cm2.  ------------------------------------------------------------------- Study data:  Comparison was made to the study of 04/14/2017.  Study status:  Routine.  Procedure:  The patient reported no pain pre or post test. Transthoracic echocardiography. Image quality was adequate.          Transthoracic echocardiography.  M-mode, complete 2D, spectral Doppler, and color Doppler.  Birthdate: Patient birthdate: 1940/12/30.  Age:  Patient is 77 yr old.  Sex: Gender: male.    BMI: 29.3 kg/m^2.  Blood pressure:     128/72 Patient status:  Outpatient.  Study date:  Study date: 09/10/2017. Study time: 02:42 PM.  Location:  [facility]  -------------------------------------------------------------------  ------------------------------------------------------------------- Left ventricle:  The cavity size was normal. Wall thickness was normal. Doppler parameters are consistent with abnormal left ventricular relaxation (grade 1 diastolic dysfunction). The E/e&' ratio is between 8-15, suggesting indeterminate LV filling pressure.  ------------------------------------------------------------------- Aortic valve:  Calcified. Severe stenosis and trivial regurgitation.  Doppler:  There was mild regurgitation.    VTI ratio of LVOT to aortic valve: 0.24. Valve area (VTI): 0.82 cm^2. Indexed valve area (VTI): 0.38 cm^2/m^2. Peak velocity ratio of LVOT to aortic valve: 0.24. Valve area (Vmax): 0.84 cm^2. Indexed valve area (Vmax): 0.39 cm^2/m^2. Mean velocity ratio of  LVOT to aortic valve: 0.21. Valve area (Vmean): 0.72 cm^2. Indexed valve area (Vmean): 0.33 cm^2/m^2.    Mean gradient (S): 31 mm Hg. Peak gradient (S): 50 mm Hg.  ------------------------------------------------------------------- Aorta:  Aortic root: The aortic root was normal in size. Ascending aorta: The ascending aorta was normal in size.  ------------------------------------------------------------------- Mitral valve:    Calcified annulus. Mildly thickened leaflets . Doppler:  There was trivial regurgitation.    Valve area by pressure half-time: 3.06 cm^2. Indexed valve area by pressure half-time: 1.42 cm^2/m^2.    Peak gradient (D): 3 mm Hg.  ------------------------------------------------------------------- Left atrium:  The atrium was normal in size.  ------------------------------------------------------------------- Atrial septum:  No defect or patent foramen ovale was identified.   ------------------------------------------------------------------- Right ventricle:  The cavity size was normal. Wall thickness was normal. Pacer wire or catheter noted in right ventricle. Systolic function was normal.  ------------------------------------------------------------------- Pulmonic valve:    The valve appears to be grossly normal. Doppler:  There was no significant regurgitation.  ------------------------------------------------------------------- Tricuspid valve:   Doppler:  There was mild regurgitation.  ------------------------------------------------------------------- Pulmonary artery:   The main pulmonary artery was normal-sized.  ------------------------------------------------------------------- Right atrium:  The atrium was normal in size. Pacer wire or catheter noted in right atrium.  ------------------------------------------------------------------- Pericardium:  There was no pericardial effusion.  ------------------------------------------------------------------- Systemic veins: Inferior vena cava: The vessel was normal in size. The respirophasic diameter changes were in the normal range (= 50%), consistent with normal central venous pressure. Diameter: 15 mm.  ------------------------------------------------------------------- Measurements   IVC                                      Value          Reference  ID                                       15    mm        ----------    Left ventricle                           Value          Reference  LV ID, ED, PLAX chordal                  51    mm       43 - 52  LV ID, ES, PLAX chordal          (H)     41    mm       23 - 38  LV fx shortening, PLAX chordal   (L)     20    %        >=29  LV PW thickness, ED                      12    mm       ----------  IVS/LV PW ratio, ED                      0.75           <=1.3  Stroke volume, 2D  68    ml       ----------  Stroke volume/bsa, 2D                    31    ml/m^2   ----------  LV end-diastolic volume, 1-p Q2V         170   ml       ----------  LV end-systolic volume, 1-p Z5G          104   ml       ----------  LV end-diastolic volume, 1-p L8V         209   ml       ----------  LV end-systolic volume, 1-p F6E          116   ml       ----------  LV ejection fraction, 1-p A4C            45    %        ----------  Stroke volume, 1-p A4C                   93    ml       ----------  LV end-diastolic volume/bsa, 1-p         97    ml/m^2   ----------  P3I  LV end-systolic volume/bsa, 1-p          53    ml/m^2   ----------  A4C  Stroke volume/bsa, 1-p A4C               43    ml/m^2   ----------  LV end-diastolic volume, 2-p             189   ml       ----------  LV end-systolic volume, 2-p              113   ml       ----------  LV ejection fraction, 2-p                40    %        ----------  Stroke volume, 2-p                       76    ml       ----------  LV end-diastolic volume/bsa, 2-p         87    ml/m^2   ----------  LV end-systolic volume/bsa, 2-p          52    ml/m^2   ----------  Stroke volume/bsa, 2-p                   35.2  ml/m^2   ----------  LV e&', lateral                           6.42  cm/s     ----------  LV E/e&', lateral                         12.32          ----------  LV e&', medial                            5.44  cm/s     ----------  LV E/e&', medial                          14.54          ----------  LV  e&', average                           5.93  cm/s     ----------  LV E/e&', average                         13.34          ----------    Ventricular septum                       Value          Reference  IVS thickness, ED                        9     mm       ----------    LVOT                                     Value          Reference  LVOT ID, S                               21    mm       ----------  LVOT area                                3.46  cm^2     ----------  LVOT peak velocity, S                    86.1  cm/s     ----------  LVOT mean velocity, S                    55    cm/s     ----------  LVOT VTI, S                              19.7  cm       ----------    Aortic valve                             Value          Reference  Aortic valve peak velocity, S            354   cm/s     ----------  Aortic valve mean velocity, S            266   cm/s     ----------  Aortic valve VTI, S                      83.1  cm       ----------  Aortic mean gradient, S                  31  mm Hg    ----------  Aortic peak gradient, S                  50    mm Hg    ----------  VTI ratio, LVOT/AV                       0.24           ----------  Aortic valve area, VTI                   0.82  cm^2     ----------  Aortic valve area/bsa, VTI               0.38  cm^2/m^2 ----------  Velocity ratio, peak, LVOT/AV            0.24           ----------  Aortic valve area, peak velocity         0.84  cm^2     ----------  Aortic valve area/bsa, peak              0.39  cm^2/m^2 ----------  velocity  Velocity ratio, mean, LVOT/AV            0.21           ----------  Aortic valve area, mean velocity         0.72  cm^2     ----------  Aortic valve area/bsa, mean              0.33  cm^2/m^2 ----------  velocity  Aortic regurg pressure half-time         416   ms       ----------    Aorta                                    Value          Reference  Aortic root ID, ED                       31    mm        ----------    Left atrium                              Value          Reference  LA ID, A-P, ES                           37    mm       ----------  LA ID/bsa, A-P                           1.71  cm/m^2   <=2.2  LA volume, S                             54.1  ml       ----------  LA volume/bsa, S                         25    ml/m^2   ----------  LA volume, ES, 1-p A4C  52.2  ml       ----------  LA volume/bsa, ES, 1-p A4C               24.1  ml/m^2   ----------  LA volume, ES, 1-p A2C                   51.6  ml       ----------  LA volume/bsa, ES, 1-p A2C               23.9  ml/m^2   ----------    Mitral valve                             Value          Reference  Mitral E-wave peak velocity              79.1  cm/s     ----------  Mitral A-wave peak velocity              83.9  cm/s     ----------  Mitral deceleration time         (H)     246   ms       150 - 230  Mitral pressure half-time                72    ms       ----------  Mitral peak gradient, D                  3     mm Hg    ----------  Mitral E/A ratio, peak                   0.9            ----------  Mitral valve area, PHT, DP               3.06  cm^2     ----------  Mitral valve area/bsa, PHT, DP           1.42  cm^2/m^2 ----------    Pulmonary arteries                       Value          Reference  PA pressure, S, DP                       29    mm Hg    <=30    Tricuspid valve                          Value          Reference  Tricuspid regurg peak velocity           256   cm/s     ----------  Tricuspid peak RV-RA gradient            26    mm Hg    ----------  Tricuspid maximal regurg                 256   cm/s     ----------  velocity, PISA    Right atrium  Value          Reference  RA ID, S-I, ES, A4C              (H)     52.3  mm       34 - 49  RA area, ES, A4C                         13.4  cm^2     8.3 - 19.5  RA volume, ES, A/L                       27.4  ml        ----------  RA volume/bsa, ES, A/L                   12.7  ml/m^2   ----------    Systemic veins                           Value          Reference  Estimated CVP                            3     mm Hg    ----------    Right ventricle                          Value          Reference  TAPSE                                    30    mm       ----------  RV pressure, S, DP                       29    mm Hg    <=30  RV s&', lateral, S                        10.2  cm/s     ----------  Legend: (L)  and  (H)  mark values outside specified reference range.  ------------------------------------------------------------------- Prepared and Electronically Authenticated by  Lyman Bishop MD 2018-12-19T17:14:01    Procedures   RIGHT/LEFT HEART CATH AND CORONARY ANGIOGRAPHY  Conclusion   Conclusions: 1. No angiographically significant coronary artery disease. 2. Mildly elevated left heart filling pressures. 3. Moderately elevated right heart filling pressure. 4. Mild pulmonary hypertension. 5. Low normal Fick cardiac output. Normal to high thermal dilutional cardiac output (I suspect this is artifactually high). 6. Likely moderate aortic stenosis with mean aortic valve gradient of 20 mmHg, calculated valve area of ~1.4 cm^2. Aortic valve was crossed without difficulty.  Recommendations: 1. Continue medical therapy of chronic systolic heart failure secondary to nonischemic cardiomyopathy and moderate aortic stenosis.  Nelva Bush, MD [facility] Pager: [phone]   Indications   Nonrheumatic aortic valve stenosis [I35.0 (ICD-10-CM)]  Ventricular fibrillation (HCC) [I49.01 ([id number])]  Chronic systolic heart failure (HCC) [I50.22 (ICD-10-CM)]  Procedural Details/Technique   Technical Details Indication: 77 y.o. year-old man with history of chronic systolic heart failure secondary to nonischemic cardiomyopathy, moderate aortic stenosis, and COPD, admitted after  ventricular fibrillation arrest during dobutamine  stress echo yesterday. Patient was successfully resuscitated with CPR and defibrillation 1. He has been referred for left and right heart catheterization to exclude coronary disease in the setting of VF as well as to further assess his degree of aortic stenosis.  GFR: >60 ml/min  Procedure: The risks, benefits, complications, treatment options, and expected outcomes were discussed with the patient. The patient and/or family concurred with the proposed plan, giving informed consent. The patient was brought to the cath lab after IV hydration was begun and oral premedication was given. The patient was further sedated with Versed and Fentanyl. The right groin was prepped and draped in the usual manner. Using the modified Seldinger access technique and a 741F micropuncture kit, 70F and 41F sheaths were placed in the right femoral vein and artery, respectively.  Right heart catheterization was performed by advancing a 70F balloon-tipped catheter through the right heart chambers and into the pulmonary capillary wedge position. Pressure measurements and oxygen saturations were obtained.  Selective coronary angiography was performed using 741F JL4 and JR4 catheters to engage the left and right coronary arteries, respectively. The aortic valve was crossed with a 0.035"J-wire and JR4 catheter. The JR4 catheter was exchanged for a dual lumen pigtail catheter, allowing simultaneous measurement of left heart and aortic pressures. Left ventriculogram was not performed.  There were no immediate complications. The patient was taken to the recovery area in stable condition.   Contrast used: 40 mL Isovue Fluoroscopy time: 9.1 min Radiation dose: 295 mGy   Estimated blood loss <50 mL.  During this procedure the patient was administered the following to achieve and maintain moderate conscious sedation: Versed 1 mg, Fentanyl 25 mcg, while the patient's heart rate, blood  pressure, and oxygen saturation were continuously monitored. The period of conscious sedation was 50 minutes, of which I was present face-to-face 100% of this time.  Complications   Complications documented before study signed (05/16/2017 5:03 PM EDT)    No complications were associated with this study.  Documented by Nelva Bush, MD - 05/16/2017 5:01 PM EDT    Coronary Findings   Diagnostic  Dominance: Right  Left Main  Vessel is large. Vessel is angiographically normal.  Left Anterior Descending  Vessel is large. Vessel is angiographically normal.  First Diagonal Branch  Vessel is moderate in size.  Left Circumflex  Vessel is large. Vessel is angiographically normal.  First Obtuse Marginal Branch  Vessel is small in size.  Second Obtuse Marginal Branch  Vessel is moderate in size.  Third Obtuse Marginal Branch  Vessel is moderate in size.  Right Coronary Artery  Vessel is large. Vessel is angiographically normal.  Right Posterior Descending Artery  Vessel is moderate in size.  Right Posterior Atrioventricular Branch  Vessel is moderate in size.  Intervention   No interventions have been documented.  Right Heart   Right Heart Pressures RA (mean): 12 mmHg RV (S/EDP): 51/14 mmHg PA (S/D, mean): 45/25 (32) mmHg PCWP (mean): 20 mmHg  Ao sat: 95% PA sat: 67%  Fick CO: 5.3 L/min Fick CI: 2.6 L/min/m^2  Thermodilution CO: 11.0 L/min Thermodilution CI: 5.3 L/min/m^2  Left Heart   Left Ventricle LV end diastolic pressure is mildly elevated. LVEDP 20 mmHg.  Aortic Valve There is moderate aortic valve stenosis. Mean aortic valve gradient: 20 mmHg. Peak-to-peak aortic valve gradient: 16 mmHg. Calculated aortic valve area: 1.4 cm^2  Coronary Diagrams   Diagnostic Diagram       Implants     No implant documentation  for this case.  MERGE Images   Show images for CARDIAC CATHETERIZATION   Link to Procedure Log   Procedure Log    Hemo Data    Most  Recent Value  Fick Cardiac Output 5.32 L/min  Fick Cardiac Output Index 2.57 (L/min)/BSA  Thermal Cardiac Output 11 L/min  Thermal Cardiac Output Index 5.31 (L/min)/BSA  Aortic Mean Gradient 19.7 mmHg  Aortic Peak Gradient 16 mmHg  Aortic Valve Area 3.08  Aortic Value Area Index 1.49 cm2/BSA  RA A Wave 12 mmHg  RA V Wave 11 mmHg  RA Mean 10 mmHg  RV Systolic Pressure 45 mmHg  RV Diastolic Pressure 4 mmHg  RV EDP 11 mmHg  PA Systolic Pressure 37 mmHg  PA Diastolic Pressure 21 mmHg  PA Mean 27 mmHg  PW A Wave 17 mmHg  PW V Wave 18 mmHg  PW Mean 17 mmHg  AO Systolic Pressure 401 mmHg  AO Diastolic Pressure 52 mmHg  AO Mean 73 mmHg  LV Systolic Pressure 027 mmHg  LV Diastolic Pressure 10 mmHg  LV EDP 14 mmHg  Arterial Occlusion Pressure Extended Systolic Pressure 253 mmHg  Arterial Occlusion Pressure Extended Diastolic Pressure 53 mmHg  Arterial Occlusion Pressure Extended Mean Pressure 73 mmHg  Left Ventricular Apex Extended Systolic Pressure 664 mmHg  Left Ventricular Apex Extended Diastolic Pressure 10 mmHg  Left Ventricular Apex Extended EDP Pressure 17 mmHg  TPVR Index 5.08 HRUI  TSVR Index 13.93 HRUI  PVR SVR Ratio 0.16  TPVR/TSVR Ratio 0.36    ADDENDUM REPORT: 10/03/2017 15:27  EXAM: OVER-READ INTERPRETATION  CT CHEST  The following report is an over-read performed by radiologist Dr. Samara Snide [facility] on 10/03/2017. This over-read does not include interpretation of cardiac or coronary anatomy or pathology. The coronary CTA interpretation by the cardiologist is attached.  COMPARISON:  07/14/2017 chest radiograph.  03/16/2014 chest CT.  FINDINGS: Please see the separate dedicated report for the concurrent CT angiogram of the chest, abdomen and pelvis for details regarding extracardiac chest findings.  IMPRESSION: Please see the separate dedicated report for the concurrent CT angiogram of the chest, abdomen and pelvis for details  regarding extracardiac chest findings.   Electronically Signed   By: Ilona Sorrel M.D.   On: 10/03/2017 15:27   Addended by Sharyn Blitz, MD on 10/03/2017 3:29 PM    Study Result   CLINICAL DATA:  Aortic Stenosis  EXAM: Cardiac TAVR CT  TECHNIQUE: The patient was scanned on a Siemens Force 403 slice scanner. A 120 kV retrospective scan was triggered in the ascending thoracic aorta at 140 HU's. Gantry rotation speed was 250 msecs and collimation was .6 mm. No beta blockade or nitro were given. The 3D data set was reconstructed in 5% intervals of the R-R cycle. Systolic and diastolic phases were analyzed on a dedicated work station using MPR, MIP and VRT modes. The patient received 80 cc of contrast.  FINDINGS: Aortic Valve: Tri leaflet and calcified with restricted motion  Aorta: Normal diameter with normal arch vessel origins Mild calcific atherosclerosis  Sino-tubular Junction: 27 mm  Ascending Thoracic Aorta: 30 mm  Aortic Arch: 28 mm  Descending Thoracic Aorta: 26 mm  Sinus of Valsalva Measurements:  Non-coronary: 30.5 mm  Right - coronary: 30.5 mm  Left -   coronary: 31 mm  Coronary Artery Height above Annulus:  Left Main: 17 mm above annulus  Right Coronary: 14.4 mm above annulus  Virtual Basal Annulus Measurements:  Maximum / Minimum  Diameter: 27.6 mm x 21.5 mm  Perimeter: 79.5 mm  Area: 463.5 mm2  Coronary Arteries: Sufficient height above annulus for deployment  Optimum Fluoroscopic Angle for Delivery: RAO 8 degrees Caudal 11 degrees  IMPRESSION: 1. Calcified tri leaflet aortic valve with annular area of 463 mm2 suitable for a 26 mm Sapien 3 valve and perimeter 79.5 suitable for a 29 mm Evolut Pro valve  2.  Coronary arteries sufficient height above annulus for deployment  3. Optimum angiographic angle for deployment RAO 8 degrees Caudal 11 degrees  Jenkins Rouge  Electronically Signed: By:  Jenkins Rouge M.D. On: 10/03/2017 14:10       CLINICAL DATA:  77 year old male with severe symptomatic aortic stenosis. Pre-TAVR evaluation.  EXAM: CT ANGIOGRAPHY CHEST, ABDOMEN AND PELVIS  TECHNIQUE: Multidetector CT imaging through the chest, abdomen and pelvis was performed using the standard protocol during bolus administration of intravenous contrast. Multiplanar reconstructed images and MIPs were obtained and reviewed to evaluate the vascular anatomy.  CONTRAST:  142m ISOVUE-370 IOPAMIDOL (ISOVUE-370) INJECTION 76%  COMPARISON:  03/16/2014 chest CT. 04/07/2014 unenhanced CT abdomen/pelvis.  FINDINGS: CTA CHEST FINDINGS  Cardiovascular: Top-normal heart size. No significant pericardial fluid/thickening. Severe thickening and calcification of the aortic valve. Left main, left anterior descending and left circumflex coronary atherosclerosis. Three lead left subclavian ICD is noted with lead tips in the right atrium, right ventricular apex and coronary sinus. Atherosclerotic minimally tortuous nonaneurysmal thoracic aorta. No evidence of acute intramural hematoma, dissection, pseudoaneurysm or penetrating atherosclerotic ulcer in the thoracic aorta. Aortic arch vessels are patent. Normal caliber pulmonary arteries. No central pulmonary emboli.  Mediastinum/Nodes: No discrete thyroid nodules. Unremarkable esophagus. No pathologically enlarged axillary, mediastinal or hilar lymph nodes.  Lungs/Pleura: No pneumothorax. No pleural effusion. Mild centrilobular emphysema with mild diffuse bronchial wall thickening. No acute consolidative airspace disease, lung masses or significant pulmonary nodules. Scattered small parenchymal bands in the mid to lower lungs bilaterally, compatible with mild postinfectious/postinflammatory scarring.  Musculoskeletal: No aggressive appearing focal osseous lesions. Mild thoracic spondylosis. Nonspecific patchy sclerosis in the  mid sternum (series 17/image 96), new since 03/16/2014 chest CT, without appreciable discrete lesion in this location. Mild gynecomastia, asymmetric to the left, new.  CTA ABDOMEN AND PELVIS FINDINGS  Hepatobiliary: Normal liver size. Heterogeneously enhancing 0.9 cm posterior right liver dome lesion (series 14/image 95) correlates with a hypodense 0.9 cm lesion in this location on the 04/07/2014 unenhanced CT study, compatible with a benign lesion such as a hemangioma. Otherwise no liver lesions. Normal gallbladder with no radiopaque cholelithiasis. No biliary ductal dilatation.  Pancreas: Normal, with no mass or duct dilation.  Spleen: Normal size. No mass.  Adrenals/Urinary Tract: Normal adrenals. Simple 1.2 cm upper left renal cyst. No additional contour deforming renal lesions. No hydronephrosis. Normal bladder.  Stomach/Bowel: Normal non-distended stomach. Normal caliber small bowel with no small bowel wall thickening. Normal appendix. Moderate left colonic diverticulosis, most prominent in the sigmoid colon, with no large bowel wall thickening or pericolonic fat stranding.  Vascular/Lymphatic: Atherosclerotic nonaneurysmal abdominal aorta. Patent splenic and renal veins. No pathologically enlarged lymph nodes in the abdomen or pelvis.  Reproductive: Mildly enlarged prostate with nonspecific coarse internal prostatic calcifications.  Other: No pneumoperitoneum, ascites or focal fluid collection.  Musculoskeletal: No aggressive appearing focal osseous lesions. Marked lumbar spondylosis.  VASCULAR MEASUREMENTS PERTINENT TO TAVR:  AORTA:  Minimal Aortic Diameter-17.2 x 16.4 mm (infrarenal abdominal aorta on series 13/image 441)  Severity of Aortic Calcification-moderate  RIGHT PELVIS:  Right Common Iliac Artery -  Minimal Diameter-9.0 x 8.3 mm  Tortuosity-mild  Calcification-moderate  Right External Iliac Artery -  Minimal  Diameter-7.1 x 6.5 mm  Tortuosity-severe  Calcification-none  Right Common Femoral Artery -  Minimal Diameter-7.6 x 7.2 mm  Tortuosity-mild  Calcification-none  LEFT PELVIS:  Left Common Iliac Artery -  Minimal Diameter-9.6 x 9.0 mm  Tortuosity-moderate  Calcification-mild  Left External Iliac Artery -  Minimal Diameter-8.0 x 7.4 mm  Tortuosity-moderate  Calcification-none  Left Common Femoral Artery -  Minimal Diameter-7.6 x 7.6 mm  Tortuosity-mild  Calcification-mild  Review of the MIP images confirms the above findings.  IMPRESSION: 1. Vascular findings and measurements pertinent to potential TAVR procedure, as detailed above. 2. Severe thickening and calcification of the aortic valve, compatible with the reported clinical history of severe aortic stenosis. 3. Left main and two-vessel coronary atherosclerosis. 4. Mild centrilobular emphysema with mild diffuse bronchial wall thickening, suggesting COPD. 5. Aortic Atherosclerosis (ICD10-I70.0) and Emphysema (ICD10-J43.9). 6. Moderate left colonic diverticulosis. 7. Mildly enlarged prostate.   Electronically Signed   By: Ilona Sorrel M.D.   On: 10/03/2017 16:17  Impression:  This 77 year old gentleman has stage D severe, symptomatic, low gradient, low EF, aortic stenosis with progressive symptoms of exertion shortness of breath consistent with chronic combined systolic and diastolic heart failure, NYHA class II. His chronic shortness of breath is no doubt multifactorial due to his severe COPD, reduced EF, and aortic stenosis but it has gotten progressively worse recently despite improvement in his EF after a biventricular pacer. He quit smoking many years ago.  I have personally reviewed his echocardiogram, cardiac catheterization, and CTA studies.  His echocardiogram shows a trileaflet aortic valve with severe thickening, calcification, and restricted leaflet mobility.  The mean  transvalvular gradient is 30 mmHg with severe left ventricular dysfunction.  Cardiac catheterization shows no significant coronary disease.  I agree that aortic valve replacement is indicated in this patient and I would expect it to improve his shortness of breath.  He would be at moderate risk for open surgical aortic valve replacement due to his age, severe left ventricular dysfunction, and severe COPD.  I think TAVR would be a reasonable alternative for this patient.  His gated cardiac CTA shows anatomy suitable for transcatheter aortic valve replacement without any significant complicating features.  The CTA of the abdomen and pelvis shows adequate pelvic vasculature to allow transfemoral insertion.  The patient and his wife were counseled at length regarding treatment alternatives for management of severe symptomatic aortic stenosis. The risks and benefits of surgical intervention has been discussed in detail. Long-term prognosis with medical therapy was discussed. Alternative approaches such as conventional surgical aortic valve replacement, transcatheter aortic valve replacement, and palliative medical therapy were compared and contrasted at length. This discussion was placed in the context of the patient's own specific clinical presentation and past medical history. All of their questions have been addressed. The patient is eager to proceed with TAVR as soon as possible.   Following the decision to proceed with transcatheter aortic valve replacement, a discussion was held regarding what types of management strategies would be attempted intraoperatively in the event of life-threatening complications, including whether or not the patient would be considered a candidate for the use of cardiopulmonary bypass and/or conversion to open sternotomy for attempted surgical intervention. The patient has been advised of a variety of complications that might develop including but not limited to risks of death,  stroke, paravalvular leak, aortic dissection or other major vascular complications,  aortic annulus rupture, device embolization, cardiac rupture or perforation, mitral regurgitation, acute myocardial infarction, arrhythmia, heart block or bradycardia requiring permanent pacemaker placement, congestive heart failure, respiratory failure, renal failure, pneumonia, infection, other late complications related to structural valve deterioration or migration, or other complications that might ultimately cause a temporary or permanent loss of functional independence or other long term morbidity. The patient provides full informed consent for the procedure as described and all questions were answered.     Plan:  Transcatheter aortic valve replacement using a Sapien 3 valve on 11/04/2017.   I spent 60 minutes performing this consultation and > 50% of this time was spent face to face counseling and coordinating the care of this patient's severe aortic stenosis.    Gaye Pollack, MD 10/07/2017

## 2017-10-08 NOTE — Progress Notes (Signed)
Patient ID: Benjamin Frost, male   DOB: 03-13-1941, 77 y.o.   MRN: [id number]  [facility] CONSULTATION REPORT  Referring Provider is Wellington Hampshire, MD PCP is Ruhl, Merri Ray, MD  Chief Complaint  Patient presents with  . New Patient (Initial Visit)    2nd TAVR evaluation    HPI:  The patient is a 77 year old gentleman with a history of hypertension, hyperlipidemia, severe COPD on home oxygen at night with a remote history of smoking, nonischemic cardiomyopathy with an ejection fraction as low as 15% by echocardiogram in 11/2013, left bundle branch block, and a history of aortic stenosis.  He has a long-standing history of exertional shortness of breath that has been attributed to a combination of severe COPD, moderate aortic stenosis, and nonischemic cardiomyopathy with severe left ventricular dysfunction.  For the past several years he has been followed by Dr. Fletcher Anon.  An echocardiogram in July 2018 showed an ejection fraction of 25-30%.  The peak velocity across aortic valve was 3.0 m/s with a mean transvalvular gradient of 20 mmHg.  He underwent a dobutamine stress test in an effort to determine if this was really low ejection fraction severe aortic stenosis.  Unfortunately he suffered a ventricular fibrillation arrest during the procedure but was promptly resuscitated and recovered uneventfully.  Cardiac catheterization the following day showed no significant coronary disease with mildly elevated filling pressures.  The mean transvalvular gradient across aortic valve was 20 mmHg with a valve area calculated at 1.4 cm.  He then underwent insertion of a biventricular pacemaker/defibrillator on 05/29/2017.  He feels like he improved somewhat with that although he continued to have significant exertional shortness of breath.  A follow-up echocardiogram afterwards showed an ejection fraction that had improved to 40-45% with  biventricular pacing.  There is felt to be severe aortic stenosis with a peak velocity across the aortic valve of 3.5 m/s with a transvalvular gradient of 31 mmHg.  The dimensionless index was 0.21 with a calculated aortic valve area of 0.72-0.84 cm.  He underwent CT angiography for consideration of TAVR and has been seen by Dr. Roxy Manns.  He is here with his wife today.  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  He continues to have exertional shortness of breath that has progressed.  It occurs with walking any distance or up inclines as well as with lifting.  He has not had much fatigue but is not doing that much activity lately.  He denies any dizziness or syncope.  He denies edema.  Past Medical History:  Diagnosis Date  . Aortic stenosis    a. mod-severe low flow low gradient AS.   Marland Kitchen Chronic back pain   . Chronic systolic heart failure (HCC)    Ejection fraction of 15%  . COPD (chronic obstructive pulmonary disease) ([place])   . History of BPH   . Hyperlipidemia   . Hypertension   . Kidney stone   . Left bundle branch block   . Sinus tachycardia     Past Surgical History:  Procedure Laterality Date  . BIV ICD INSERTION CRT-D N/A 05/29/2017   SJM Quadra Assura implanted by Dr Rayann Heman for secondary prevention of sudden death  . CARDIAC CATHETERIZATION  2012   [facility]  . CARDIAC CATHETERIZATION     [facility]  .  LEFT AND RIGHT HEART CATHETERIZATION WITH CORONARY ANGIOGRAM N/A 03/23/2014   Procedure: LEFT AND RIGHT HEART CATHETERIZATION WITH CORONARY ANGIOGRAM;  Surgeon: Wellington Hampshire, MD;  Location: [facility];  Service: Cardiovascular;  Laterality: N/A;  . RIGHT/LEFT HEART CATH AND CORONARY ANGIOGRAPHY N/A 05/16/2017   Procedure: RIGHT/LEFT HEART CATH AND CORONARY ANGIOGRAPHY;  Surgeon: Nelva Bush, MD;  Location: [facility];  Service: Cardiovascular;  Laterality:  N/A;    Family History  Problem Relation Age of Onset  . Heart disease Father   . Heart failure Father   . Hypertension Mother   . Hyperlipidemia Mother     Social History   Socioeconomic History  . Marital status: Married    Spouse name: Not on file  . Number of children: Not on file  . Years of education: Not on file  . Highest education level: Not on file  Social Needs  . Financial resource strain: Not on file  . Food insecurity - worry: Not on file  . Food insecurity - inability: Not on file  . Transportation needs - medical: Not on file  . Transportation needs - non-medical: Not on file  Occupational History  . Not on file  Tobacco Use  . Smoking status: Former Smoker    Years: 0.00    Types: Cigarettes  . Smokeless tobacco: Former Systems developer    Types: Snuff  Substance and Sexual Activity  . Alcohol use: Yes    Comment: occasional Beer   . Drug use: No  . Sexual activity: Not on file  Other Topics Concern  . Not on file  Social History Narrative  . Not on file    Current Outpatient Medications  Medication Sig Dispense Refill  . albuterol (PROAIR HFA) 108 (90 BASE) MCG/ACT inhaler Inhale 2 puffs into the lungs every 6 (six) hours as needed for wheezing or shortness of breath.    Marland Kitchen aspirin 81 MG tablet Take 81 mg by mouth daily.    Marland Kitchen atorvastatin (LIPITOR) 40 MG tablet Take 40 mg by mouth daily.    . carvedilol (COREG) 3.125 MG tablet Take 1 tablet (3.125 mg total) by mouth 2 (two) times daily. 60 tablet 3  . digoxin (LANOXIN) 0.125 MG tablet Take 1 tablet (0.125 mg total) by mouth daily. 90 tablet 3  . Fluticasone-Salmeterol (ADVAIR) 250-50 MCG/DOSE AEPB Inhale 1 puff into the lungs 2 (two) times daily.    . ivabradine (CORLANOR) 5 MG TABS tablet Take 1 tablet (5 mg total) by mouth 2 (two) times daily with a meal. 60 tablet 3  . losartan (COZAAR) 25 MG tablet Take 1 tablet (25 mg total) by mouth daily. 30 tablet 3  . omeprazole (PRILOSEC) 20 MG capsule Take 20 mg  by mouth daily.    Marland Kitchen spironolactone (ALDACTONE) 25 MG tablet Take 1 tablet (25 mg total) by mouth daily. 30 tablet 0  . tamsulosin (FLOMAX) 0.4 MG CAPS capsule Take 0.4 mg by mouth daily.    Marland Kitchen tiotropium (SPIRIVA) 18 MCG inhalation capsule Place 18 mcg into inhaler and inhale daily.     No current facility-administered medications for this visit.     Allergies  Allergen Reactions  . Dobutamine     Noted during stress echo per patient and daughter  . Amlodipine Other (See Comments)    unknown  . Lisinopril Hives       . Metoprolol Nausea And Vomiting    Nausea and vomiting       Review  of Systems:   General:  normal appetite, normal energy,  weight gain, no weight loss, no fever  Cardiac:  Has had chest pain with exertion, no chest pain at rest, hasSOB with moderate exertion, no resting SOB, no PND, no orthopnea, no palpitations, no arrhythmia, no atrial fibrillation, no LE edema, no dizzy spells, no syncope  Respiratory:  has shortness of breath, uses home oxygen, no productive cough, no dry cough, had recent bronchitis, no wheezing, no hemoptysis, no asthma, no pain with inspiration or cough, no sleep apnea, no CPAP at night  GI:   no difficulty swallowing, no reflux, no frequent heartburn, no hiatal hernia, no abdominal pain, no constipation, no diarrhea, no hematochezia, no hematemesis, no melena  GU:   no dysuria,  no frequency, no urinary tract infection, no hematuria, no enlarged prostate, no kidney stones, no kidney disease  Vascular:  no pain suggestive of claudication, no pain in feet, no leg cramps, no varicose veins, no DVT, no non-healing foot ulcer  Neuro:   no stroke, no TIA's, no seizures, no headaches, no temporary blindness one eye,  no slurred speech, no peripheral neuropathy, no chronic pain, no instability of gait, no memory/cognitive dysfunction  Musculoskeletal: no arthritis, no joint swelling, no myalgias, no difficulty walking, normal mobility   Skin:   no  rash, no itching, no skin infections, no pressure sores or ulcerations  Psych:   no anxiety, no depression, no nervousness, no unusual recent stress  Eyes:   no blurry vision, no floaters, no recent vision changes, he wears glasses or contacts  ENT:   has hearing loss, no loose or painful teeth, no dentures, last saw dentist 6 months ago  Hematologic:  has easy bruising, no abnormal bleeding, no clotting disorder, no frequent epistaxis  Endocrine:  no diabetes, does not check CBG's at home      Physical Exam:   BP 123/74 (BP Location: Right Arm, Patient Position: Sitting, Cuff Size: Large)   Pulse 94   Resp 20   Ht 5' 10"  (1.778 m)   Wt 203 lb (92.1 kg)   SpO2 92% Comment: RA  BMI 29.13 kg/m   General:  Elderly but  well-appearing  HEENT:  Unremarkable, NCAT, PERLA, EOMI, oropharynx clear  Neck:   no JVD, no bruits, no adenopathy or thyromegaly  Chest:   clear to auscultation, symmetrical breath sounds, no wheezes, no rhonchi   CV:   RRR, grade III/VI crescendo/decrescendo murmur heard best at RSB,  no diastolic murmur  Abdomen:  soft, non-tender, no masses or organomegaly  Extremities:  warm, well-perfused, pulses palpable, no LE edema  Rectal/GU  Deferred  Neuro:   Grossly non-focal and symmetrical throughout  Skin:   Clean and dry, no rashes, no breakdown   Diagnostic Tests:  Result status: Final result                           [facility]                        [address], [place] [address]                            [phone]  ------------------------------------------------------------------- Transthoracic Echocardiography  Patient:  Koby, Pickup MR #:       [id number] Study Date: 09/10/2017 Gender:     M Age:        74 Height:     177.8 cm Weight:     92.7 kg BSA:        2.16 m^2 Pt. 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  ------------------------------------------------------------------- History:   PMH:  s/p VF, hyperlipidemia.  Chronic obstructive pulmonary disease.  Risk factors:  Hypertension.  ------------------------------------------------------------------- Study Conclusions  - Left ventricle: The cavity size was normal. Wall thickness was   normal. Doppler parameters are consistent with abnormal left   ventricular relaxation (grade 1 diastolic dysfunction). The E/e&'   ratio is between 8-15, suggesting indeterminate LV filling   pressure. - Aortic valve: Calcified. Severe stenosis and trivial   regurgitation. There was mild regurgitation. Mean gradient (S):   31 mm Hg. Peak gradient (S): 50 mm Hg. Valve area (VTI): 0.82   cm^2. Valve area (Vmax): 0.84 cm^2. Valve area (Vmean): 0.72   cm^2. - Mitral valve: Calcified annulus. Mildly thickened leaflets .   There was trivial regurgitation. - Left atrium: The atrium was normal in size. - Right ventricle: Pacer wire or catheter noted in right ventricle. - Right atrium: The atrium was normal in size. Pacer wire or   catheter noted in right atrium. - Tricuspid valve: There was mild regurgitation. - Pulmonary arteries: PA peak pressure: 29 mm Hg (S). - Inferior vena cava: The vessel was normal in size. The   respirophasic diameter changes were in the normal range (= 50%),   consistent with normal central venous pressure.  Impressions:  - Compared to a prior study in 04/2017, the LVEF is higher at 40-45%   with global hypokinesis. There is now severe aortic stenosis with   mean gradient of 31 mmHg and peak gradient of 50 mmHg -   calculated AVA of 0.8  cm2.  ------------------------------------------------------------------- Study data:  Comparison was made to the study of 04/14/2017.  Study status:  Routine.  Procedure:  The patient reported no pain pre or post test. Transthoracic echocardiography. Image quality was adequate.          Transthoracic echocardiography.  M-mode, complete 2D, spectral Doppler, and color Doppler.  Birthdate: Patient birthdate: 1940/12/30.  Age:  Patient is 77 yr old.  Sex: Gender: male.    BMI: 29.3 kg/m^2.  Blood pressure:     128/72 Patient status:  Outpatient.  Study date:  Study date: 09/10/2017. Study time: 02:42 PM.  Location:  [facility]  -------------------------------------------------------------------  ------------------------------------------------------------------- Left ventricle:  The cavity size was normal. Wall thickness was normal. Doppler parameters are consistent with abnormal left ventricular relaxation (grade 1 diastolic dysfunction). The E/e&' ratio is between 8-15, suggesting indeterminate LV filling pressure.  ------------------------------------------------------------------- Aortic valve:  Calcified. Severe stenosis and trivial regurgitation.  Doppler:  There was mild regurgitation.    VTI ratio of LVOT to aortic valve: 0.24. Valve area (VTI): 0.82 cm^2. Indexed valve area (VTI): 0.38 cm^2/m^2. Peak velocity ratio of LVOT to aortic valve: 0.24. Valve area (Vmax): 0.84 cm^2. Indexed valve area (Vmax): 0.39 cm^2/m^2. Mean velocity ratio of  LVOT to aortic valve: 0.21. Valve area (Vmean): 0.72 cm^2. Indexed valve area (Vmean): 0.33 cm^2/m^2.    Mean gradient (S): 31 mm Hg. Peak gradient (S): 50 mm Hg.  ------------------------------------------------------------------- Aorta:  Aortic root: The aortic root was normal in size. Ascending aorta: The ascending aorta was normal in size.  ------------------------------------------------------------------- Mitral valve:    Calcified annulus. Mildly thickened leaflets . Doppler:  There was trivial regurgitation.    Valve area by pressure half-time: 3.06 cm^2. Indexed valve area by pressure half-time: 1.42 cm^2/m^2.    Peak gradient (D): 3 mm Hg.  ------------------------------------------------------------------- Left atrium:  The atrium was normal in size.  ------------------------------------------------------------------- Atrial septum:  No defect or patent foramen ovale was identified.   ------------------------------------------------------------------- Right ventricle:  The cavity size was normal. Wall thickness was normal. Pacer wire or catheter noted in right ventricle. Systolic function was normal.  ------------------------------------------------------------------- Pulmonic valve:    The valve appears to be grossly normal. Doppler:  There was no significant regurgitation.  ------------------------------------------------------------------- Tricuspid valve:   Doppler:  There was mild regurgitation.  ------------------------------------------------------------------- Pulmonary artery:   The main pulmonary artery was normal-sized.  ------------------------------------------------------------------- Right atrium:  The atrium was normal in size. Pacer wire or catheter noted in right atrium.  ------------------------------------------------------------------- Pericardium:  There was no pericardial effusion.  ------------------------------------------------------------------- Systemic veins: Inferior vena cava: The vessel was normal in size. The respirophasic diameter changes were in the normal range (= 50%), consistent with normal central venous pressure. Diameter: 15 mm.  ------------------------------------------------------------------- Measurements   IVC                                      Value          Reference  ID                                       15    mm        ----------    Left ventricle                           Value          Reference  LV ID, ED, PLAX chordal                  51    mm       43 - 52  LV ID, ES, PLAX chordal          (H)     41    mm       23 - 38  LV fx shortening, PLAX chordal   (L)     20    %        >=29  LV PW thickness, ED                      12    mm       ----------  IVS/LV PW ratio, ED                      0.75           <=1.3  Stroke volume, 2D  68    ml       ----------  Stroke volume/bsa, 2D                    31    ml/m^2   ----------  LV end-diastolic volume, 1-p W0J         170   ml       ----------  LV end-systolic volume, 1-p W1X          104   ml       ----------  LV end-diastolic volume, 1-p B1Y         209   ml       ----------  LV end-systolic volume, 1-p N8G          116   ml       ----------  LV ejection fraction, 1-p A4C            45    %        ----------  Stroke volume, 1-p A4C                   93    ml       ----------  LV end-diastolic volume/bsa, 1-p         97    ml/m^2   ----------  N5A  LV end-systolic volume/bsa, 1-p          53    ml/m^2   ----------  A4C  Stroke volume/bsa, 1-p A4C               43    ml/m^2   ----------  LV end-diastolic volume, 2-p             189   ml       ----------  LV end-systolic volume, 2-p              113   ml       ----------  LV ejection fraction, 2-p                40    %        ----------  Stroke volume, 2-p                       76    ml       ----------  LV end-diastolic volume/bsa, 2-p         87    ml/m^2   ----------  LV end-systolic volume/bsa, 2-p          52    ml/m^2   ----------  Stroke volume/bsa, 2-p                   35.2  ml/m^2   ----------  LV e&', lateral                           6.42  cm/s     ----------  LV E/e&', lateral                         12.32          ----------  LV e&', medial                            5.44  cm/s     ----------  LV E/e&', medial                          14.54          ----------  LV  e&', average                           5.93  cm/s     ----------  LV E/e&', average                         13.34          ----------    Ventricular septum                       Value          Reference  IVS thickness, ED                        9     mm       ----------    LVOT                                     Value          Reference  LVOT ID, S                               21    mm       ----------  LVOT area                                3.46  cm^2     ----------  LVOT peak velocity, S                    86.1  cm/s     ----------  LVOT mean velocity, S                    55    cm/s     ----------  LVOT VTI, S                              19.7  cm       ----------    Aortic valve                             Value          Reference  Aortic valve peak velocity, S            354   cm/s     ----------  Aortic valve mean velocity, S            266   cm/s     ----------  Aortic valve VTI, S                      83.1  cm       ----------  Aortic mean gradient, S                  31  mm Hg    ----------  Aortic peak gradient, S                  50    mm Hg    ----------  VTI ratio, LVOT/AV                       0.24           ----------  Aortic valve area, VTI                   0.82  cm^2     ----------  Aortic valve area/bsa, VTI               0.38  cm^2/m^2 ----------  Velocity ratio, peak, LVOT/AV            0.24           ----------  Aortic valve area, peak velocity         0.84  cm^2     ----------  Aortic valve area/bsa, peak              0.39  cm^2/m^2 ----------  velocity  Velocity ratio, mean, LVOT/AV            0.21           ----------  Aortic valve area, mean velocity         0.72  cm^2     ----------  Aortic valve area/bsa, mean              0.33  cm^2/m^2 ----------  velocity  Aortic regurg pressure half-time         416   ms       ----------    Aorta                                    Value          Reference  Aortic root ID, ED                       31    mm        ----------    Left atrium                              Value          Reference  LA ID, A-P, ES                           37    mm       ----------  LA ID/bsa, A-P                           1.71  cm/m^2   <=2.2  LA volume, S                             54.1  ml       ----------  LA volume/bsa, S                         25    ml/m^2   ----------  LA volume, ES, 1-p A4C  52.2  ml       ----------  LA volume/bsa, ES, 1-p A4C               24.1  ml/m^2   ----------  LA volume, ES, 1-p A2C                   51.6  ml       ----------  LA volume/bsa, ES, 1-p A2C               23.9  ml/m^2   ----------    Mitral valve                             Value          Reference  Mitral E-wave peak velocity              79.1  cm/s     ----------  Mitral A-wave peak velocity              83.9  cm/s     ----------  Mitral deceleration time         (H)     246   ms       150 - 230  Mitral pressure half-time                72    ms       ----------  Mitral peak gradient, D                  3     mm Hg    ----------  Mitral E/A ratio, peak                   0.9            ----------  Mitral valve area, PHT, DP               3.06  cm^2     ----------  Mitral valve area/bsa, PHT, DP           1.42  cm^2/m^2 ----------    Pulmonary arteries                       Value          Reference  PA pressure, S, DP                       29    mm Hg    <=30    Tricuspid valve                          Value          Reference  Tricuspid regurg peak velocity           256   cm/s     ----------  Tricuspid peak RV-RA gradient            26    mm Hg    ----------  Tricuspid maximal regurg                 256   cm/s     ----------  velocity, PISA    Right atrium  Value          Reference  RA ID, S-I, ES, A4C              (H)     52.3  mm       34 - 49  RA area, ES, A4C                         13.4  cm^2     8.3 - 19.5  RA volume, ES, A/L                       27.4  ml        ----------  RA volume/bsa, ES, A/L                   12.7  ml/m^2   ----------    Systemic veins                           Value          Reference  Estimated CVP                            3     mm Hg    ----------    Right ventricle                          Value          Reference  TAPSE                                    30    mm       ----------  RV pressure, S, DP                       29    mm Hg    <=30  RV s&', lateral, S                        10.2  cm/s     ----------  Legend: (L)  and  (H)  mark values outside specified reference range.  ------------------------------------------------------------------- Prepared and Electronically Authenticated by  Lyman Bishop MD 2018-12-19T17:14:01    Procedures   RIGHT/LEFT HEART CATH AND CORONARY ANGIOGRAPHY  Conclusion   Conclusions: 1. No angiographically significant coronary artery disease. 2. Mildly elevated left heart filling pressures. 3. Moderately elevated right heart filling pressure. 4. Mild pulmonary hypertension. 5. Low normal Fick cardiac output. Normal to high thermal dilutional cardiac output (I suspect this is artifactually high). 6. Likely moderate aortic stenosis with mean aortic valve gradient of 20 mmHg, calculated valve area of ~1.4 cm^2. Aortic valve was crossed without difficulty.  Recommendations: 1. Continue medical therapy of chronic systolic heart failure secondary to nonischemic cardiomyopathy and moderate aortic stenosis.  Nelva Bush, MD [facility] Pager: [phone]   Indications   Nonrheumatic aortic valve stenosis [I35.0 (ICD-10-CM)]  Ventricular fibrillation (HCC) [I49.01 ([id number])]  Chronic systolic heart failure (HCC) [I50.22 (ICD-10-CM)]  Procedural Details/Technique   Technical Details Indication: 77 y.o. year-old man with history of chronic systolic heart failure secondary to nonischemic cardiomyopathy, moderate aortic stenosis, and COPD, admitted after  ventricular fibrillation arrest during dobutamine  stress echo yesterday. Patient was successfully resuscitated with CPR and defibrillation 1. He has been referred for left and right heart catheterization to exclude coronary disease in the setting of VF as well as to further assess his degree of aortic stenosis.  GFR: >60 ml/min  Procedure: The risks, benefits, complications, treatment options, and expected outcomes were discussed with the patient. The patient and/or family concurred with the proposed plan, giving informed consent. The patient was brought to the cath lab after IV hydration was begun and oral premedication was given. The patient was further sedated with Versed and Fentanyl. The right groin was prepped and draped in the usual manner. Using the modified Seldinger access technique and a 741F micropuncture kit, 70F and 41F sheaths were placed in the right femoral vein and artery, respectively.  Right heart catheterization was performed by advancing a 70F balloon-tipped catheter through the right heart chambers and into the pulmonary capillary wedge position. Pressure measurements and oxygen saturations were obtained.  Selective coronary angiography was performed using 741F JL4 and JR4 catheters to engage the left and right coronary arteries, respectively. The aortic valve was crossed with a 0.035"J-wire and JR4 catheter. The JR4 catheter was exchanged for a dual lumen pigtail catheter, allowing simultaneous measurement of left heart and aortic pressures. Left ventriculogram was not performed.  There were no immediate complications. The patient was taken to the recovery area in stable condition.   Contrast used: 40 mL Isovue Fluoroscopy time: 9.1 min Radiation dose: 295 mGy   Estimated blood loss <50 mL.  During this procedure the patient was administered the following to achieve and maintain moderate conscious sedation: Versed 1 mg, Fentanyl 25 mcg, while the patient's heart rate, blood  pressure, and oxygen saturation were continuously monitored. The period of conscious sedation was 50 minutes, of which I was present face-to-face 100% of this time.  Complications   Complications documented before study signed (05/16/2017 5:03 PM EDT)    No complications were associated with this study.  Documented by Nelva Bush, MD - 05/16/2017 5:01 PM EDT    Coronary Findings   Diagnostic  Dominance: Right  Left Main  Vessel is large. Vessel is angiographically normal.  Left Anterior Descending  Vessel is large. Vessel is angiographically normal.  First Diagonal Branch  Vessel is moderate in size.  Left Circumflex  Vessel is large. Vessel is angiographically normal.  First Obtuse Marginal Branch  Vessel is small in size.  Second Obtuse Marginal Branch  Vessel is moderate in size.  Third Obtuse Marginal Branch  Vessel is moderate in size.  Right Coronary Artery  Vessel is large. Vessel is angiographically normal.  Right Posterior Descending Artery  Vessel is moderate in size.  Right Posterior Atrioventricular Branch  Vessel is moderate in size.  Intervention   No interventions have been documented.  Right Heart   Right Heart Pressures RA (mean): 12 mmHg RV (S/EDP): 51/14 mmHg PA (S/D, mean): 45/25 (32) mmHg PCWP (mean): 20 mmHg  Ao sat: 95% PA sat: 67%  Fick CO: 5.3 L/min Fick CI: 2.6 L/min/m^2  Thermodilution CO: 11.0 L/min Thermodilution CI: 5.3 L/min/m^2  Left Heart   Left Ventricle LV end diastolic pressure is mildly elevated. LVEDP 20 mmHg.  Aortic Valve There is moderate aortic valve stenosis. Mean aortic valve gradient: 20 mmHg. Peak-to-peak aortic valve gradient: 16 mmHg. Calculated aortic valve area: 1.4 cm^2  Coronary Diagrams   Diagnostic Diagram       Implants     No implant documentation  for this case.  MERGE Images   Show images for CARDIAC CATHETERIZATION   Link to Procedure Log   Procedure Log    Hemo Data    Most  Recent Value  Fick Cardiac Output 5.32 L/min  Fick Cardiac Output Index 2.57 (L/min)/BSA  Thermal Cardiac Output 11 L/min  Thermal Cardiac Output Index 5.31 (L/min)/BSA  Aortic Mean Gradient 19.7 mmHg  Aortic Peak Gradient 16 mmHg  Aortic Valve Area 3.08  Aortic Value Area Index 1.49 cm2/BSA  RA A Wave 12 mmHg  RA V Wave 11 mmHg  RA Mean 10 mmHg  RV Systolic Pressure 45 mmHg  RV Diastolic Pressure 4 mmHg  RV EDP 11 mmHg  PA Systolic Pressure 37 mmHg  PA Diastolic Pressure 21 mmHg  PA Mean 27 mmHg  PW A Wave 17 mmHg  PW V Wave 18 mmHg  PW Mean 17 mmHg  AO Systolic Pressure 283 mmHg  AO Diastolic Pressure 52 mmHg  AO Mean 73 mmHg  LV Systolic Pressure 662 mmHg  LV Diastolic Pressure 10 mmHg  LV EDP 14 mmHg  Arterial Occlusion Pressure Extended Systolic Pressure 947 mmHg  Arterial Occlusion Pressure Extended Diastolic Pressure 53 mmHg  Arterial Occlusion Pressure Extended Mean Pressure 73 mmHg  Left Ventricular Apex Extended Systolic Pressure 654 mmHg  Left Ventricular Apex Extended Diastolic Pressure 10 mmHg  Left Ventricular Apex Extended EDP Pressure 17 mmHg  TPVR Index 5.08 HRUI  TSVR Index 13.93 HRUI  PVR SVR Ratio 0.16  TPVR/TSVR Ratio 0.36    ADDENDUM REPORT: 10/03/2017 15:27  EXAM: OVER-READ INTERPRETATION  CT CHEST  The following report is an over-read performed by radiologist Dr. Samara Snide [facility] on 10/03/2017. This over-read does not include interpretation of cardiac or coronary anatomy or pathology. The coronary CTA interpretation by the cardiologist is attached.  COMPARISON:  07/14/2017 chest radiograph.  03/16/2014 chest CT.  FINDINGS: Please see the separate dedicated report for the concurrent CT angiogram of the chest, abdomen and pelvis for details regarding extracardiac chest findings.  IMPRESSION: Please see the separate dedicated report for the concurrent CT angiogram of the chest, abdomen and pelvis for details  regarding extracardiac chest findings.   Electronically Signed   By: Ilona Sorrel M.D.   On: 10/03/2017 15:27   Addended by Sharyn Blitz, MD on 10/03/2017 3:29 PM    Study Result   CLINICAL DATA:  Aortic Stenosis  EXAM: Cardiac TAVR CT  TECHNIQUE: The patient was scanned on a Siemens Force 650 slice scanner. A 120 kV retrospective scan was triggered in the ascending thoracic aorta at 140 HU's. Gantry rotation speed was 250 msecs and collimation was .6 mm. No beta blockade or nitro were given. The 3D data set was reconstructed in 5% intervals of the R-R cycle. Systolic and diastolic phases were analyzed on a dedicated work station using MPR, MIP and VRT modes. The patient received 80 cc of contrast.  FINDINGS: Aortic Valve: Tri leaflet and calcified with restricted motion  Aorta: Normal diameter with normal arch vessel origins Mild calcific atherosclerosis  Sino-tubular Junction: 27 mm  Ascending Thoracic Aorta: 30 mm  Aortic Arch: 28 mm  Descending Thoracic Aorta: 26 mm  Sinus of Valsalva Measurements:  Non-coronary: 30.5 mm  Right - coronary: 30.5 mm  Left -   coronary: 31 mm  Coronary Artery Height above Annulus:  Left Main: 17 mm above annulus  Right Coronary: 14.4 mm above annulus  Virtual Basal Annulus Measurements:  Maximum / Minimum  Diameter: 27.6 mm x 21.5 mm  Perimeter: 79.5 mm  Area: 463.5 mm2  Coronary Arteries: Sufficient height above annulus for deployment  Optimum Fluoroscopic Angle for Delivery: RAO 8 degrees Caudal 11 degrees  IMPRESSION: 1. Calcified tri leaflet aortic valve with annular area of 463 mm2 suitable for a 26 mm Sapien 3 valve and perimeter 79.5 suitable for a 29 mm Evolut Pro valve  2.  Coronary arteries sufficient height above annulus for deployment  3. Optimum angiographic angle for deployment RAO 8 degrees Caudal 11 degrees  Jenkins Rouge  Electronically Signed: By:  Jenkins Rouge M.D. On: 10/03/2017 14:10       CLINICAL DATA:  77 year old male with severe symptomatic aortic stenosis. Pre-TAVR evaluation.  EXAM: CT ANGIOGRAPHY CHEST, ABDOMEN AND PELVIS  TECHNIQUE: Multidetector CT imaging through the chest, abdomen and pelvis was performed using the standard protocol during bolus administration of intravenous contrast. Multiplanar reconstructed images and MIPs were obtained and reviewed to evaluate the vascular anatomy.  CONTRAST:  167m ISOVUE-370 IOPAMIDOL (ISOVUE-370) INJECTION 76%  COMPARISON:  03/16/2014 chest CT. 04/07/2014 unenhanced CT abdomen/pelvis.  FINDINGS: CTA CHEST FINDINGS  Cardiovascular: Top-normal heart size. No significant pericardial fluid/thickening. Severe thickening and calcification of the aortic valve. Left main, left anterior descending and left circumflex coronary atherosclerosis. Three lead left subclavian ICD is noted with lead tips in the right atrium, right ventricular apex and coronary sinus. Atherosclerotic minimally tortuous nonaneurysmal thoracic aorta. No evidence of acute intramural hematoma, dissection, pseudoaneurysm or penetrating atherosclerotic ulcer in the thoracic aorta. Aortic arch vessels are patent. Normal caliber pulmonary arteries. No central pulmonary emboli.  Mediastinum/Nodes: No discrete thyroid nodules. Unremarkable esophagus. No pathologically enlarged axillary, mediastinal or hilar lymph nodes.  Lungs/Pleura: No pneumothorax. No pleural effusion. Mild centrilobular emphysema with mild diffuse bronchial wall thickening. No acute consolidative airspace disease, lung masses or significant pulmonary nodules. Scattered small parenchymal bands in the mid to lower lungs bilaterally, compatible with mild postinfectious/postinflammatory scarring.  Musculoskeletal: No aggressive appearing focal osseous lesions. Mild thoracic spondylosis. Nonspecific patchy sclerosis in the  mid sternum (series 17/image 96), new since 03/16/2014 chest CT, without appreciable discrete lesion in this location. Mild gynecomastia, asymmetric to the left, new.  CTA ABDOMEN AND PELVIS FINDINGS  Hepatobiliary: Normal liver size. Heterogeneously enhancing 0.9 cm posterior right liver dome lesion (series 14/image 95) correlates with a hypodense 0.9 cm lesion in this location on the 04/07/2014 unenhanced CT study, compatible with a benign lesion such as a hemangioma. Otherwise no liver lesions. Normal gallbladder with no radiopaque cholelithiasis. No biliary ductal dilatation.  Pancreas: Normal, with no mass or duct dilation.  Spleen: Normal size. No mass.  Adrenals/Urinary Tract: Normal adrenals. Simple 1.2 cm upper left renal cyst. No additional contour deforming renal lesions. No hydronephrosis. Normal bladder.  Stomach/Bowel: Normal non-distended stomach. Normal caliber small bowel with no small bowel wall thickening. Normal appendix. Moderate left colonic diverticulosis, most prominent in the sigmoid colon, with no large bowel wall thickening or pericolonic fat stranding.  Vascular/Lymphatic: Atherosclerotic nonaneurysmal abdominal aorta. Patent splenic and renal veins. No pathologically enlarged lymph nodes in the abdomen or pelvis.  Reproductive: Mildly enlarged prostate with nonspecific coarse internal prostatic calcifications.  Other: No pneumoperitoneum, ascites or focal fluid collection.  Musculoskeletal: No aggressive appearing focal osseous lesions. Marked lumbar spondylosis.  VASCULAR MEASUREMENTS PERTINENT TO TAVR:  AORTA:  Minimal Aortic Diameter-17.2 x 16.4 mm (infrarenal abdominal aorta on series 13/image 441)  Severity of Aortic Calcification-moderate  RIGHT PELVIS:  Right Common Iliac Artery -  Minimal Diameter-9.0 x 8.3 mm  Tortuosity-mild  Calcification-moderate  Right External Iliac Artery -  Minimal  Diameter-7.1 x 6.5 mm  Tortuosity-severe  Calcification-none  Right Common Femoral Artery -  Minimal Diameter-7.6 x 7.2 mm  Tortuosity-mild  Calcification-none  LEFT PELVIS:  Left Common Iliac Artery -  Minimal Diameter-9.6 x 9.0 mm  Tortuosity-moderate  Calcification-mild  Left External Iliac Artery -  Minimal Diameter-8.0 x 7.4 mm  Tortuosity-moderate  Calcification-none  Left Common Femoral Artery -  Minimal Diameter-7.6 x 7.6 mm  Tortuosity-mild  Calcification-mild  Review of the MIP images confirms the above findings.  IMPRESSION: 1. Vascular findings and measurements pertinent to potential TAVR procedure, as detailed above. 2. Severe thickening and calcification of the aortic valve, compatible with the reported clinical history of severe aortic stenosis. 3. Left main and two-vessel coronary atherosclerosis. 4. Mild centrilobular emphysema with mild diffuse bronchial wall thickening, suggesting COPD. 5. Aortic Atherosclerosis (ICD10-I70.0) and Emphysema (ICD10-J43.9). 6. Moderate left colonic diverticulosis. 7. Mildly enlarged prostate.   Electronically Signed   By: Ilona Sorrel M.D.   On: 10/03/2017 16:17  Impression:  This 77 year old gentleman has stage D severe, symptomatic, low gradient, low EF, aortic stenosis with progressive symptoms of exertion shortness of breath consistent with chronic combined systolic and diastolic heart failure, NYHA class II. His chronic shortness of breath is no doubt multifactorial due to his severe COPD, reduced EF, and aortic stenosis but it has gotten progressively worse recently despite improvement in his EF after a biventricular pacer. He quit smoking many years ago.  I have personally reviewed his echocardiogram, cardiac catheterization, and CTA studies.  His echocardiogram shows a trileaflet aortic valve with severe thickening, calcification, and restricted leaflet mobility.  The mean  transvalvular gradient is 30 mmHg with severe left ventricular dysfunction.  Cardiac catheterization shows no significant coronary disease.  I agree that aortic valve replacement is indicated in this patient and I would expect it to improve his shortness of breath.  He would be at moderate risk for open surgical aortic valve replacement due to his age, severe left ventricular dysfunction, and severe COPD.  I think TAVR would be a reasonable alternative for this patient.  His gated cardiac CTA shows anatomy suitable for transcatheter aortic valve replacement without any significant complicating features.  The CTA of the abdomen and pelvis shows adequate pelvic vasculature to allow transfemoral insertion.  The patient and his wife were counseled at length regarding treatment alternatives for management of severe symptomatic aortic stenosis. The risks and benefits of surgical intervention has been discussed in detail. Long-term prognosis with medical therapy was discussed. Alternative approaches such as conventional surgical aortic valve replacement, transcatheter aortic valve replacement, and palliative medical therapy were compared and contrasted at length. This discussion was placed in the context of the patient's own specific clinical presentation and past medical history. All of their questions have been addressed. The patient is eager to proceed with TAVR as soon as possible.   Following the decision to proceed with transcatheter aortic valve replacement, a discussion was held regarding what types of management strategies would be attempted intraoperatively in the event of life-threatening complications, including whether or not the patient would be considered a candidate for the use of cardiopulmonary bypass and/or conversion to open sternotomy for attempted surgical intervention. The patient has been advised of a variety of complications that might develop including but not limited to risks of death,  stroke, paravalvular leak, aortic dissection or other major vascular complications,  aortic annulus rupture, device embolization, cardiac rupture or perforation, mitral regurgitation, acute myocardial infarction, arrhythmia, heart block or bradycardia requiring permanent pacemaker placement, congestive heart failure, respiratory failure, renal failure, pneumonia, infection, other late complications related to structural valve deterioration or migration, or other complications that might ultimately cause a temporary or permanent loss of functional independence or other long term morbidity. The patient provides full informed consent for the procedure as described and all questions were answered.     Plan:  Transcatheter aortic valve replacement using a Sapien 3 valve on 11/04/2017.   I spent 60 minutes performing this consultation and > 50% of this time was spent face to face counseling and coordinating the care of this patient's severe aortic stenosis.    Gaye Pollack, MD 10/07/2017

## 2017-10-15 ENCOUNTER — Other Ambulatory Visit: Payer: Self-pay

## 2017-10-15 MED ORDER — IVABRADINE HCL 5 MG PO TABS: 5.0000 mg | ORAL_TABLET | Freq: Two times a day (BID) | ORAL | 3 refills | Status: DC

## 2017-10-15 NOTE — Telephone Encounter (Signed)
[facility] requesting a refill on Corlanor 5 mg one tablet twice a day.

## 2017-10-27 ENCOUNTER — Other Ambulatory Visit: Payer: Self-pay

## 2017-10-30 NOTE — Pre-Procedure Instructions (Signed)
Benjamin Frost  10/30/2017      [facility] - [place], [place] - [address] [place] [address] Phone: [phone] Fax: [phone]    Your procedure is scheduled on Tuesday February 12.  Report to [facility] Admitting at 10:30 A.M.  Call this number if you have problems the morning of surgery:  [phone]   Remember:  Do not eat food or drink liquids after midnight.  Take these medicines the morning of surgery with A SIP OF WATER:    Omeprazole (prilosec) Tamsulosin (Flomax) Albuterol inhaler if needed (please bring to hospital with you) Spiriva (tiotropium)  7 days prior to surgery STOP taking any Aleve, Naproxen, Ibuprofen, Motrin, Advil, Goody's, BC's, all herbal medications, fish oil, and all vitamins    Do not wear jewelry, make-up or nail polish.  Do not wear lotions, powders, or perfumes, or deodorant.  Do not shave 48 hours prior to surgery.  Men may shave face and neck.  Do not bring valuables to the hospital.  [facility] is not responsible for any belongings or valuables.  Contacts, dentures or bridgework may not be worn into surgery.  Leave your suitcase in the car.  After surgery it may be brought to your room.  For patients admitted to the hospital, discharge time will be determined by your treatment team.  Patients discharged the day of surgery will not be allowed to drive home.   Special instructions:    [place]- Preparing For Surgery  Before surgery, you can play an important role. Because skin is not sterile, your skin needs to be as free of germs as possible. You can reduce the number of germs on your skin by washing with CHG (chlorahexidine gluconate) Soap before surgery.  CHG is an antiseptic cleaner which kills germs and bonds with the skin to continue killing germs even after washing.  Please do not use if you have an allergy to CHG or antibacterial soaps. If your skin becomes  reddened/irritated stop using the CHG.  Do not shave (including legs and underarms) for at least 48 hours prior to first CHG shower. It is OK to shave your face.  Please follow these instructions carefully.   1. Shower the NIGHT BEFORE SURGERY and the MORNING OF SURGERY with CHG.   2. If you chose to wash your hair, wash your hair first as usual with your normal shampoo.  3. After you shampoo, rinse your hair and body thoroughly to remove the shampoo.  4. Use CHG as you would any other liquid soap. You can apply CHG directly to the skin and wash gently with a scrungie or a clean washcloth.   5. Apply the CHG Soap to your body ONLY FROM THE NECK DOWN.  Do not use on open wounds or open sores. Avoid contact with your eyes, ears, mouth and genitals (private parts). Wash Face and genitals (private parts)  with your normal soap.  6. Wash thoroughly, paying special attention to the area where your surgery will be performed.  7. Thoroughly rinse your body with warm water from the neck down.  8. DO NOT shower/wash with your normal soap after using and rinsing off the CHG Soap.  9. Pat yourself dry with a CLEAN TOWEL.  10. Wear CLEAN PAJAMAS to bed the night before surgery, wear comfortable clothes the morning of surgery  11. Place CLEAN SHEETS on your bed the night of your first shower and DO NOT  SLEEP WITH PETS.    Day of Surgery: Do not apply any deodorants/lotions. Please wear clean clothes to the hospital/surgery center.      Please read over the following fact sheets that you were given. Coughing and Deep Breathing, MRSA Information and Surgical Site Infection Prevention

## 2017-10-31 ENCOUNTER — Encounter (HOSPITAL_COMMUNITY)
Admission: RE | Admit: 2017-10-31 | Discharge: 2017-10-31 | Disposition: A | Discharge status: Home / Self Care | Payer: Medicare Other | Source: Ambulatory Visit | Attending: Cardiovascular Disease | Admitting: Cardiovascular Disease

## 2017-10-31 ENCOUNTER — Encounter (HOSPITAL_COMMUNITY): Payer: Self-pay

## 2017-10-31 ENCOUNTER — Other Ambulatory Visit: Payer: Self-pay

## 2017-10-31 ENCOUNTER — Ambulatory Visit (HOSPITAL_COMMUNITY)
Admission: RE | Admit: 2017-10-31 | Discharge: 2017-10-31 | Disposition: A | Discharge status: Home / Self Care | Payer: Medicare Other | Source: Ambulatory Visit | Attending: Cardiovascular Disease | Admitting: Cardiovascular Disease

## 2017-10-31 DIAGNOSIS — R9431 Abnormal electrocardiogram [ECG] [EKG]: Secondary | ICD-10-CM | POA: Insufficient documentation

## 2017-10-31 DIAGNOSIS — I352 Nonrheumatic aortic (valve) stenosis with insufficiency: Secondary | ICD-10-CM | POA: Diagnosis not present

## 2017-10-31 DIAGNOSIS — I350 Nonrheumatic aortic (valve) stenosis: Secondary | ICD-10-CM

## 2017-10-31 DIAGNOSIS — J449 Chronic obstructive pulmonary disease, unspecified: Secondary | ICD-10-CM | POA: Diagnosis not present

## 2017-10-31 DIAGNOSIS — Z01818 Encounter for other preprocedural examination: Secondary | ICD-10-CM | POA: Insufficient documentation

## 2017-10-31 DIAGNOSIS — I5022 Chronic systolic (congestive) heart failure: Secondary | ICD-10-CM | POA: Insufficient documentation

## 2017-10-31 DIAGNOSIS — R0602 Shortness of breath: Secondary | ICD-10-CM | POA: Insufficient documentation

## 2017-10-31 HISTORY — DX: Presence of automatic (implantable) cardiac defibrillator: Z95.810

## 2017-10-31 HISTORY — DX: Gastro-esophageal reflux disease without esophagitis: K21.9

## 2017-10-31 HISTORY — DX: Personal history of urinary calculi: Z87.442

## 2017-10-31 LAB — BLOOD GAS, ARTERIAL
ACID-BASE EXCESS: 3.2 mmol/L — AB (ref 0.0–2.0)
Bicarbonate: 27.2 mmol/L (ref 20.0–28.0)
DRAWN BY: 421801
O2 SAT: 95.5 %
Patient temperature: 98.6
pCO2 arterial: 41.5 mmHg (ref 32.0–48.0)
pH, Arterial: 7.432 (ref 7.350–7.450)
pO2, Arterial: 75.8 mmHg — ABNORMAL LOW (ref 83.0–108.0)

## 2017-10-31 LAB — COMPREHENSIVE METABOLIC PANEL
ALT: 14 U/L — AB (ref 17–63)
AST: 19 U/L (ref 15–41)
Albumin: 3.7 g/dL (ref 3.5–5.0)
Alkaline Phosphatase: 52 U/L (ref 38–126)
Anion gap: 13 (ref 5–15)
BILIRUBIN TOTAL: 0.7 mg/dL (ref 0.3–1.2)
BUN: 11 mg/dL (ref 6–20)
CALCIUM: 9.2 mg/dL (ref 8.9–10.3)
CO2: 22 mmol/L (ref 22–32)
CREATININE: 0.69 mg/dL (ref 0.61–1.24)
Chloride: 103 mmol/L (ref 101–111)
GFR calc non Af Amer: >60 mL/min (ref >60)
GLUCOSE: 133 mg/dL — AB (ref 65–99)
Potassium: 3.8 mmol/L (ref 3.5–5.1)
Sodium: 138 mmol/L (ref 135–145)
TOTAL PROTEIN: 6.2 g/dL — AB (ref 6.5–8.1)

## 2017-10-31 LAB — TYPE AND SCREEN
ABO/RH(D): A POS
Antibody Screen: NEGATIVE

## 2017-10-31 LAB — CBC
HEMATOCRIT: 42.1 % (ref 39.0–52.0)
HEMOGLOBIN: 14 g/dL (ref 13.0–17.0)
MCH: 30.9 pg (ref 26.0–34.0)
MCHC: 33.3 g/dL (ref 30.0–36.0)
MCV: 92.9 fL (ref 78.0–100.0)
Platelets: 120 10*3/uL — ABNORMAL LOW (ref 150–400)
RBC: 4.53 MIL/uL (ref 4.22–5.81)
RDW: 13.4 % (ref 11.5–15.5)
WBC: 7.4 10*3/uL (ref 4.0–10.5)

## 2017-10-31 LAB — SURGICAL PCR SCREEN
MRSA, PCR: NEGATIVE
Staphylococcus aureus: NEGATIVE

## 2017-10-31 LAB — BRAIN NATRIURETIC PEPTIDE: B NATRIURETIC PEPTIDE 5: 23.9 pg/mL (ref 0.0–100.0)

## 2017-10-31 LAB — PROTIME-INR
INR: 1.02
PROTHROMBIN TIME: 13.3 s (ref 11.4–15.2)

## 2017-10-31 LAB — APTT: APTT: 27 s (ref 24–36)

## 2017-10-31 LAB — ABO/RH: ABO/RH(D): A POS

## 2017-10-31 LAB — HEMOGLOBIN A1C
Hgb A1c MFr Bld: 5.4 % (ref 4.8–5.6)
Mean Plasma Glucose: 108.28 mg/dL

## 2017-10-31 NOTE — Progress Notes (Signed)
PCP - Pt states his PCP rotates and he is unsure who it is. Chart lists [facility] Cardiologist - Dr Kirke Corin EP- [name], ICD/PPM form faxed and Rep with [facility] notified.   Chest x-ray - 10/31/2017  EKG - 10/31/2017  Stress Test - 06/09/17- pt CODED with dobutamine infusion.  ECHO - 09/10/17 Cardiac Cath - 05/16/17  Aspirin Instructions: Pt to continue aspirin. Will not take DOS.   Anesthesia review: cardiac hx, PPM/ICD  Patient denies shortness of breath, fever, cough and chest pain at PAT appointment   Patient verbalized understanding of instructions that were given to them at the PAT appointment. Patient was also instructed that they will need to review over the PAT instructions again at home before surgery.

## 2017-11-03 MED ORDER — DEXMEDETOMIDINE HCL IN NACL 400 MCG/100ML IV SOLN
0.1000 ug/kg/h | INTRAVENOUS | Status: AC
  Administered 2017-11-04: .8 ug/kg/h via INTRAVENOUS
  Filled 2017-11-03: qty 100

## 2017-11-03 MED ORDER — CHLORHEXIDINE GLUCONATE 0.12 % MT SOLN: 15.0000 mL | Freq: Once | OROMUCOSAL | Status: DC

## 2017-11-03 MED ORDER — DOPAMINE-DEXTROSE 3.2-5 MG/ML-% IV SOLN
0.0000 ug/kg/min | INTRAVENOUS | Status: DC
  Filled 2017-11-03: qty 250

## 2017-11-03 MED ORDER — SODIUM CHLORIDE 0.9 % IV SOLN: INTRAVENOUS | Status: DC

## 2017-11-03 MED ORDER — CEFUROXIME SODIUM 1.5 G IV SOLR
1.5000 g | INTRAVENOUS | Status: AC
  Administered 2017-11-04: 1.5 g via INTRAVENOUS
  Filled 2017-11-03: qty 1.5

## 2017-11-03 MED ORDER — SODIUM CHLORIDE 0.9 % IV SOLN
1500.0000 mg | INTRAVENOUS | Status: AC
  Administered 2017-11-04: 1000 mg via INTRAVENOUS
  Filled 2017-11-03: qty 1500

## 2017-11-03 MED ORDER — NITROGLYCERIN IN D5W 200-5 MCG/ML-% IV SOLN
2.0000 ug/min | INTRAVENOUS | Status: DC
  Filled 2017-11-03: qty 250

## 2017-11-03 MED ORDER — POTASSIUM CHLORIDE 2 MEQ/ML IV SOLN
80.0000 meq | INTRAVENOUS | Status: DC
  Filled 2017-11-03: qty 40

## 2017-11-03 MED ORDER — EPINEPHRINE PF 1 MG/ML IJ SOLN
0.0000 ug/min | INTRAVENOUS | Status: DC
  Filled 2017-11-03: qty 4

## 2017-11-03 MED ORDER — SODIUM CHLORIDE 0.9 % IV SOLN
INTRAVENOUS | Status: DC
  Filled 2017-11-03: qty 1

## 2017-11-03 MED ORDER — MAGNESIUM SULFATE 50 % IJ SOLN
40.0000 meq | INTRAMUSCULAR | Status: DC
  Filled 2017-11-03: qty 9.85

## 2017-11-03 MED ORDER — NOREPINEPHRINE BITARTRATE 1 MG/ML IV SOLN
0.0000 ug/min | INTRAVENOUS | Status: AC
  Administered 2017-11-04: 2 ug/min via INTRAVENOUS
  Filled 2017-11-03: qty 4

## 2017-11-03 MED ORDER — HEPARIN SODIUM (PORCINE) 1000 UNIT/ML IJ SOLN
INTRAMUSCULAR | Status: DC
  Filled 2017-11-03: qty 30

## 2017-11-03 MED ORDER — SODIUM CHLORIDE 0.9 % IV SOLN
30.0000 ug/min | INTRAVENOUS | Status: DC
  Filled 2017-11-03: qty 2

## 2017-11-03 NOTE — Progress Notes (Signed)
Received ICD form back from [name] and it stated that industry rep would need to be present day of surgery.  Notified Kerry Fort with [employer] who stated that the rep is normally not present for TAVR's.  Informed Carlean Jews who stated that she would confirm with [name] and would follow-up with Kerry Fort.

## 2017-11-04 ENCOUNTER — Inpatient Hospital Stay (HOSPITAL_COMMUNITY): Payer: Medicare Other

## 2017-11-04 ENCOUNTER — Encounter (HOSPITAL_COMMUNITY)
Admission: RE | Disposition: A | Discharge status: Home / Self Care | Payer: Self-pay | Source: Ambulatory Visit | Attending: Cardiovascular Disease

## 2017-11-04 ENCOUNTER — Inpatient Hospital Stay (HOSPITAL_COMMUNITY): Payer: Medicare Other | Admitting: Anesthesiology

## 2017-11-04 ENCOUNTER — Inpatient Hospital Stay (HOSPITAL_COMMUNITY)
Admission: RE | Admit: 2017-11-04 | Discharge: 2017-11-05 | DRG: 267 | Disposition: A | Discharge status: Home / Self Care | Payer: Medicare Other | Source: Ambulatory Visit | Attending: Cardiovascular Disease | Admitting: Cardiovascular Disease

## 2017-11-04 ENCOUNTER — Inpatient Hospital Stay (HOSPITAL_COMMUNITY): Payer: Medicare Other | Admitting: Emergency Medicine

## 2017-11-04 ENCOUNTER — Encounter (HOSPITAL_COMMUNITY): Payer: Self-pay | Admitting: *Deleted

## 2017-11-04 DIAGNOSIS — Z87442 Personal history of urinary calculi: Secondary | ICD-10-CM | POA: Diagnosis not present

## 2017-11-04 DIAGNOSIS — Z006 Encounter for examination for normal comparison and control in clinical research program: Secondary | ICD-10-CM

## 2017-11-04 DIAGNOSIS — E785 Hyperlipidemia, unspecified: Secondary | ICD-10-CM | POA: Diagnosis present

## 2017-11-04 DIAGNOSIS — I5022 Chronic systolic (congestive) heart failure: Secondary | ICD-10-CM | POA: Diagnosis present

## 2017-11-04 DIAGNOSIS — Z95810 Presence of automatic (implantable) cardiac defibrillator: Secondary | ICD-10-CM

## 2017-11-04 DIAGNOSIS — Z9981 Dependence on supplemental oxygen: Secondary | ICD-10-CM | POA: Diagnosis not present

## 2017-11-04 DIAGNOSIS — I429 Cardiomyopathy, unspecified: Secondary | ICD-10-CM | POA: Diagnosis present

## 2017-11-04 DIAGNOSIS — I2510 Atherosclerotic heart disease of native coronary artery without angina pectoris: Secondary | ICD-10-CM | POA: Diagnosis present

## 2017-11-04 DIAGNOSIS — J449 Chronic obstructive pulmonary disease, unspecified: Secondary | ICD-10-CM | POA: Diagnosis present

## 2017-11-04 DIAGNOSIS — Z952 Presence of prosthetic heart valve: Secondary | ICD-10-CM

## 2017-11-04 DIAGNOSIS — Z8349 Family history of other endocrine, nutritional and metabolic diseases: Secondary | ICD-10-CM

## 2017-11-04 DIAGNOSIS — Z87891 Personal history of nicotine dependence: Secondary | ICD-10-CM

## 2017-11-04 DIAGNOSIS — K219 Gastro-esophageal reflux disease without esophagitis: Secondary | ICD-10-CM | POA: Diagnosis present

## 2017-11-04 DIAGNOSIS — D6959 Other secondary thrombocytopenia: Secondary | ICD-10-CM | POA: Diagnosis not present

## 2017-11-04 DIAGNOSIS — I350 Nonrheumatic aortic (valve) stenosis: Principal | ICD-10-CM | POA: Diagnosis present

## 2017-11-04 DIAGNOSIS — I110 Hypertensive heart disease with heart failure: Secondary | ICD-10-CM | POA: Diagnosis present

## 2017-11-04 DIAGNOSIS — Z8249 Family history of ischemic heart disease and other diseases of the circulatory system: Secondary | ICD-10-CM

## 2017-11-04 DIAGNOSIS — I5042 Chronic combined systolic (congestive) and diastolic (congestive) heart failure: Secondary | ICD-10-CM | POA: Diagnosis present

## 2017-11-04 DIAGNOSIS — I428 Other cardiomyopathies: Secondary | ICD-10-CM

## 2017-11-04 DIAGNOSIS — I447 Left bundle-branch block, unspecified: Secondary | ICD-10-CM | POA: Diagnosis present

## 2017-11-04 DIAGNOSIS — I10 Essential (primary) hypertension: Secondary | ICD-10-CM | POA: Diagnosis present

## 2017-11-04 HISTORY — PX: TEE WITHOUT CARDIOVERSION: SHX5443

## 2017-11-04 HISTORY — DX: Presence of prosthetic heart valve: Z95.2

## 2017-11-04 HISTORY — PX: TRANSCATHETER AORTIC VALVE REPLACEMENT, TRANSFEMORAL: SHX6400

## 2017-11-04 HISTORY — DX: Nonrheumatic aortic (valve) stenosis: I35.0

## 2017-11-04 LAB — URINALYSIS, ROUTINE W REFLEX MICROSCOPIC
BILIRUBIN URINE: NEGATIVE
Glucose, UA: NEGATIVE mg/dL
HGB URINE DIPSTICK: NEGATIVE
Ketones, ur: NEGATIVE mg/dL
NITRITE: NEGATIVE
Protein, ur: NEGATIVE mg/dL
SPECIFIC GRAVITY, URINE: 1.023 (ref 1.005–1.030)
pH: 7 (ref 5.0–8.0)

## 2017-11-04 LAB — CBC
HCT: 39.9 % (ref 39.0–52.0)
Hemoglobin: 13.1 g/dL (ref 13.0–17.0)
MCH: 30.7 pg (ref 26.0–34.0)
MCHC: 32.8 g/dL (ref 30.0–36.0)
MCV: 93.4 fL (ref 78.0–100.0)
PLATELETS: 108 10*3/uL — AB (ref 150–400)
RBC: 4.27 MIL/uL (ref 4.22–5.81)
RDW: 13.1 % (ref 11.5–15.5)
WBC: 7.9 10*3/uL (ref 4.0–10.5)

## 2017-11-04 LAB — POCT I-STAT, CHEM 8
BUN: 13 mg/dL (ref 6–20)
BUN: 12 mg/dL (ref 6–20)
CREATININE: 0.6 mg/dL — AB (ref 0.61–1.24)
Calcium, Ion: 1.25 mmol/L (ref 1.15–1.40)
Calcium, Ion: 1.2 mmol/L (ref 1.15–1.40)
Chloride: 101 mmol/L (ref 101–111)
Chloride: 99 mmol/L — ABNORMAL LOW (ref 101–111)
Creatinine, Ser: 0.7 mg/dL (ref 0.61–1.24)
GLUCOSE: 112 mg/dL — AB (ref 65–99)
Glucose, Bld: 115 mg/dL — ABNORMAL HIGH (ref 65–99)
HCT: 38 % — ABNORMAL LOW (ref 39.0–52.0)
HCT: 35 % — ABNORMAL LOW (ref 39.0–52.0)
HEMOGLOBIN: 12.9 g/dL — AB (ref 13.0–17.0)
Hemoglobin: 11.9 g/dL — ABNORMAL LOW (ref 13.0–17.0)
POTASSIUM: 3.8 mmol/L (ref 3.5–5.1)
Potassium: 3.9 mmol/L (ref 3.5–5.1)
Sodium: 140 mmol/L (ref 135–145)
Sodium: 140 mmol/L (ref 135–145)
TCO2: 29 mmol/L (ref 22–32)
TCO2: 29 mmol/L (ref 22–32)

## 2017-11-04 LAB — PROTIME-INR
INR: 1.09
PROTHROMBIN TIME: 14 s (ref 11.4–15.2)

## 2017-11-04 LAB — POCT I-STAT 4, (NA,K, GLUC, HGB,HCT)
Glucose, Bld: 105 mg/dL — ABNORMAL HIGH (ref 65–99)
HEMATOCRIT: 36 % — AB (ref 39.0–52.0)
Hemoglobin: 12.2 g/dL — ABNORMAL LOW (ref 13.0–17.0)
Potassium: 3.8 mmol/L (ref 3.5–5.1)
SODIUM: 140 mmol/L (ref 135–145)

## 2017-11-04 LAB — APTT: APTT: 32 s (ref 24–36)

## 2017-11-04 SURGERY — IMPLANTATION, AORTIC VALVE, TRANSCATHETER, FEMORAL APPROACH
Anesthesia: Monitor Anesthesia Care | Site: Chest

## 2017-11-04 MED ORDER — FENTANYL CITRATE (PF) 100 MCG/2ML IJ SOLN: 50.0000 ug | Freq: Once | INTRAMUSCULAR | Status: DC

## 2017-11-04 MED ORDER — LACTATED RINGERS IV SOLN
INTRAVENOUS | Status: DC | PRN
  Administered 2017-11-04: 14:00:00 via INTRAVENOUS

## 2017-11-04 MED ORDER — LACTATED RINGERS IV SOLN: INTRAVENOUS | Status: DC

## 2017-11-04 MED ORDER — MORPHINE SULFATE (PF) 4 MG/ML IV SOLN: 2.0000 mg | INTRAVENOUS | Status: DC | PRN

## 2017-11-04 MED ORDER — ATORVASTATIN CALCIUM 40 MG PO TABS
40.0000 mg | ORAL_TABLET | Freq: Every day | ORAL | Status: DC
  Administered 2017-11-04: 40 mg via ORAL
  Filled 2017-11-04: qty 1

## 2017-11-04 MED ORDER — DIGOXIN 125 MCG PO TABS
0.1250 mg | ORAL_TABLET | Freq: Every day | ORAL | Status: DC
  Administered 2017-11-04 – 2017-11-05 (×2): 0.125 mg via ORAL
  Filled 2017-11-04 (×2): qty 1

## 2017-11-04 MED ORDER — MOMETASONE FURO-FORMOTEROL FUM 200-5 MCG/ACT IN AERO
2.0000 | INHALATION_SPRAY | Freq: Two times a day (BID) | RESPIRATORY_TRACT | Status: DC
  Administered 2017-11-05: 2 via RESPIRATORY_TRACT
  Filled 2017-11-04: qty 8.8

## 2017-11-04 MED ORDER — PANTOPRAZOLE SODIUM 40 MG PO TBEC: 40.0000 mg | DELAYED_RELEASE_TABLET | Freq: Every day | ORAL | Status: DC

## 2017-11-04 MED ORDER — TAMSULOSIN HCL 0.4 MG PO CAPS
0.4000 mg | ORAL_CAPSULE | Freq: Every day | ORAL | Status: DC
  Administered 2017-11-04 – 2017-11-05 (×2): 0.4 mg via ORAL
  Filled 2017-11-04 (×2): qty 1

## 2017-11-04 MED ORDER — HEPARIN SODIUM (PORCINE) 1000 UNIT/ML IJ SOLN
INTRAMUSCULAR | Status: DC | PRN
  Administered 2017-11-04: 14000 [IU] via INTRAVENOUS

## 2017-11-04 MED ORDER — ORAL CARE MOUTH RINSE
15.0000 mL | Freq: Two times a day (BID) | OROMUCOSAL | Status: DC
  Administered 2017-11-05: 15 mL via OROMUCOSAL

## 2017-11-04 MED ORDER — ACETAMINOPHEN 500 MG PO TABS
1000.0000 mg | ORAL_TABLET | Freq: Four times a day (QID) | ORAL | Status: DC
  Administered 2017-11-05 (×3): 1000 mg via ORAL
  Filled 2017-11-04 (×3): qty 2

## 2017-11-04 MED ORDER — ASPIRIN 81 MG PO CHEW: 81.0000 mg | CHEWABLE_TABLET | Freq: Every day | ORAL | Status: DC

## 2017-11-04 MED ORDER — MIDAZOLAM HCL 2 MG/2ML IJ SOLN
1.5000 mg | Freq: Once | INTRAMUSCULAR | Status: DC
Start: 2017-11-04 — End: 2017-11-04

## 2017-11-04 MED ORDER — ACETAMINOPHEN 160 MG/5ML PO SOLN: 1000.0000 mg | Freq: Four times a day (QID) | ORAL | Status: DC

## 2017-11-04 MED ORDER — IODIXANOL 320 MG/ML IV SOLN
INTRAVENOUS | Status: DC | PRN
  Administered 2017-11-04: 150 mL via INTRA_ARTERIAL

## 2017-11-04 MED ORDER — CHLORHEXIDINE GLUCONATE 4 % EX LIQD: 30.0000 mL | CUTANEOUS | Status: DC

## 2017-11-04 MED ORDER — SODIUM CHLORIDE 0.9 % IV SOLN
1.5000 g | Freq: Two times a day (BID) | INTRAVENOUS | Status: DC
  Administered 2017-11-04 – 2017-11-05 (×2): 1.5 g via INTRAVENOUS
  Filled 2017-11-04 (×3): qty 1.5

## 2017-11-04 MED ORDER — LACTATED RINGERS IV SOLN: 500.0000 mL | Freq: Once | INTRAVENOUS | Status: DC | PRN

## 2017-11-04 MED ORDER — ASPIRIN EC 81 MG PO TBEC
81.0000 mg | DELAYED_RELEASE_TABLET | Freq: Every day | ORAL | Status: DC
  Administered 2017-11-05: 81 mg via ORAL
  Filled 2017-11-04: qty 1

## 2017-11-04 MED ORDER — CHLORHEXIDINE GLUCONATE 4 % EX LIQD: 60.0000 mL | Freq: Once | CUTANEOUS | Status: DC

## 2017-11-04 MED ORDER — SODIUM CHLORIDE 0.9 % IV SOLN
INTRAVENOUS | Status: DC
  Administered 2017-11-04: 17:00:00 via INTRAVENOUS

## 2017-11-04 MED ORDER — FENTANYL CITRATE (PF) 250 MCG/5ML IJ SOLN
INTRAMUSCULAR | Status: AC
  Filled 2017-11-04: qty 5

## 2017-11-04 MED ORDER — NITROGLYCERIN IN D5W 200-5 MCG/ML-% IV SOLN: 0.0000 ug/min | INTRAVENOUS | Status: DC

## 2017-11-04 MED ORDER — METOPROLOL TARTRATE 5 MG/5ML IV SOLN: 2.5000 mg | INTRAVENOUS | Status: DC | PRN

## 2017-11-04 MED ORDER — FENTANYL CITRATE (PF) 250 MCG/5ML IJ SOLN
INTRAMUSCULAR | Status: DC | PRN
  Administered 2017-11-04 (×3): 25 ug via INTRAVENOUS

## 2017-11-04 MED ORDER — SODIUM CHLORIDE 0.9 % IV SOLN
0.0000 ug/min | INTRAVENOUS | Status: DC
  Filled 2017-11-04: qty 2

## 2017-11-04 MED ORDER — PROPOFOL 10 MG/ML IV BOLUS
INTRAVENOUS | Status: DC | PRN
  Administered 2017-11-04 (×2): 5 mg via INTRAVENOUS

## 2017-11-04 MED ORDER — TIOTROPIUM BROMIDE MONOHYDRATE 18 MCG IN CAPS
18.0000 ug | ORAL_CAPSULE | Freq: Every day | RESPIRATORY_TRACT | Status: DC
  Administered 2017-11-05: 18 ug via RESPIRATORY_TRACT
  Filled 2017-11-04: qty 5

## 2017-11-04 MED ORDER — IVABRADINE HCL 5 MG PO TABS
5.0000 mg | ORAL_TABLET | Freq: Two times a day (BID) | ORAL | Status: DC
  Administered 2017-11-04 – 2017-11-05 (×2): 5 mg via ORAL
  Filled 2017-11-04 (×3): qty 1

## 2017-11-04 MED ORDER — LIDOCAINE HCL 1 % IJ SOLN
INTRAMUSCULAR | Status: DC | PRN
  Administered 2017-11-04: 30 mL via RESPIRATORY_TRACT

## 2017-11-04 MED ORDER — VANCOMYCIN HCL IN DEXTROSE 1-5 GM/200ML-% IV SOLN
1000.0000 mg | Freq: Once | INTRAVENOUS | Status: AC
  Administered 2017-11-05: 1000 mg via INTRAVENOUS
  Filled 2017-11-04: qty 200

## 2017-11-04 MED ORDER — MIDAZOLAM HCL 2 MG/2ML IJ SOLN
0.5000 mg | Freq: Once | INTRAMUSCULAR | Status: AC
  Administered 2017-11-04: 0.5 mg via INTRAVENOUS

## 2017-11-04 MED ORDER — LIDOCAINE HCL (PF) 1 % IJ SOLN
INTRAMUSCULAR | Status: AC
  Filled 2017-11-04: qty 30

## 2017-11-04 MED ORDER — SODIUM CHLORIDE 0.9 % IV SOLN
INTRAVENOUS | Status: DC | PRN
  Administered 2017-11-04: 14:00:00 1500 mL

## 2017-11-04 MED ORDER — PROTAMINE SULFATE 10 MG/ML IV SOLN
INTRAVENOUS | Status: DC | PRN
  Administered 2017-11-04: 120 mg via INTRAVENOUS
  Administered 2017-11-04: 20 mg via INTRAVENOUS

## 2017-11-04 MED ORDER — 0.9 % SODIUM CHLORIDE (POUR BTL) OPTIME
TOPICAL | Status: DC | PRN
  Administered 2017-11-04: 4000 mL

## 2017-11-04 MED ORDER — CLOPIDOGREL BISULFATE 75 MG PO TABS
75.0000 mg | ORAL_TABLET | Freq: Every day | ORAL | Status: DC
  Administered 2017-11-05: 75 mg via ORAL
  Filled 2017-11-04: qty 1

## 2017-11-04 MED ORDER — LACTATED RINGERS IV SOLN
INTRAVENOUS | Status: DC
Start: 2017-11-04 — End: 2017-11-04
  Administered 2017-11-04: 11:00:00 via INTRAVENOUS

## 2017-11-04 MED ORDER — OXYCODONE HCL 5 MG PO TABS
5.0000 mg | ORAL_TABLET | ORAL | Status: DC | PRN
  Administered 2017-11-04: 10 mg via ORAL
  Filled 2017-11-04: qty 2

## 2017-11-04 MED ORDER — MIDAZOLAM HCL 2 MG/2ML IJ SOLN: 2.0000 mg | INTRAMUSCULAR | Status: DC | PRN

## 2017-11-04 MED ORDER — FENTANYL CITRATE (PF) 100 MCG/2ML IJ SOLN
INTRAMUSCULAR | Status: AC
Start: 2017-11-04 — End: 2017-11-04
  Administered 2017-11-04: 50 ug
  Filled 2017-11-04: qty 2

## 2017-11-04 MED ORDER — ONDANSETRON HCL 4 MG/2ML IJ SOLN
INTRAMUSCULAR | Status: DC | PRN
  Administered 2017-11-04: 4 mg via INTRAVENOUS

## 2017-11-04 MED ORDER — ALBUMIN HUMAN 5 % IV SOLN: 250.0000 mL | INTRAVENOUS | Status: AC | PRN

## 2017-11-04 MED ORDER — ALBUTEROL SULFATE (2.5 MG/3ML) 0.083% IN NEBU: 3.0000 mL | INHALATION_SOLUTION | Freq: Four times a day (QID) | RESPIRATORY_TRACT | Status: DC | PRN

## 2017-11-04 MED ORDER — MIDAZOLAM HCL 2 MG/2ML IJ SOLN
INTRAMUSCULAR | Status: AC
  Administered 2017-11-04: 1 mg via INTRAVENOUS
  Filled 2017-11-04: qty 2

## 2017-11-04 MED ORDER — TRAMADOL HCL 50 MG PO TABS
50.0000 mg | ORAL_TABLET | ORAL | Status: DC | PRN
  Administered 2017-11-04: 50 mg via ORAL
  Filled 2017-11-04: qty 1

## 2017-11-04 MED ORDER — ONDANSETRON HCL 4 MG/2ML IJ SOLN: 4.0000 mg | Freq: Four times a day (QID) | INTRAMUSCULAR | Status: DC | PRN

## 2017-11-04 SURGICAL SUPPLY — 99 items
ADAPTER UNIV SWAN GANZ BIP (ADAPTER) ×1 IMPLANT
ADAPTER UNV SWAN GANZ BIP (ADAPTER) ×2
ATTRACTOMAT 16X20 MAGNETIC DRP (DRAPES) IMPLANT
BAG BANDED W/RUBBER/TAPE 36X54 (MISCELLANEOUS) ×3 IMPLANT
BAG DECANTER FOR FLEXI CONT (MISCELLANEOUS) IMPLANT
BAG SNAP BAND KOVER 36X36 (MISCELLANEOUS) ×6 IMPLANT
BLADE 10 SAFETY STRL DISP (BLADE) ×3 IMPLANT
BLADE CLIPPER SURG (BLADE) IMPLANT
BLADE STERNUM SYSTEM 6 (BLADE) ×3 IMPLANT
CABLE ADAPT CONN TEMP 6FT (ADAPTER) ×3 IMPLANT
CABLE PACING FASLOC BIEGE (MISCELLANEOUS) ×3 IMPLANT
CABLE PACING FASLOC BLUE (MISCELLANEOUS) ×3 IMPLANT
CANISTER SUCT 3000ML PPV (MISCELLANEOUS) IMPLANT
CANNULA FEM VENOUS REMOTE 22FR (CANNULA) IMPLANT
CANNULA OPTISITE PERFUSION 16F (CANNULA) IMPLANT
CANNULA OPTISITE PERFUSION 18F (CANNULA) IMPLANT
CATH DIAG EXPO 6F VENT PIG 145 (CATHETERS) ×6 IMPLANT
CATH EXPO 5FR AL1 (CATHETERS) ×3 IMPLANT
CATH S G BIP PACING (SET/KITS/TRAYS/PACK) ×6 IMPLANT
CLIP VESOCCLUDE MED 24/CT (CLIP) ×3 IMPLANT
CLIP VESOCCLUDE SM WIDE 24/CT (CLIP) ×3 IMPLANT
CONT SPEC 4OZ CLIKSEAL STRL BL (MISCELLANEOUS) ×6 IMPLANT
COVER BACK TABLE 60X90IN (DRAPES) ×3 IMPLANT
COVER BACK TABLE 80X110 HD (DRAPES) ×3 IMPLANT
COVER DOME SNAP 22 D (MISCELLANEOUS) ×3 IMPLANT
COVER MAYO STAND STRL (DRAPES) ×3 IMPLANT
CRADLE DONUT ADULT HEAD (MISCELLANEOUS) ×3 IMPLANT
DERMABOND ADVANCED (GAUZE/BANDAGES/DRESSINGS) ×2
DERMABOND ADVANCED .7 DNX12 (GAUZE/BANDAGES/DRESSINGS) ×1 IMPLANT
DEVICE CLOSURE PERCLS PRGLD 6F (VASCULAR PRODUCTS) ×2 IMPLANT
DRAPE INCISE IOBAN 66X45 STRL (DRAPES) IMPLANT
DRAPE SLUSH MACHINE 52X66 (DRAPES) ×3 IMPLANT
DRSG TEGADERM 4X4.75 (GAUZE/BANDAGES/DRESSINGS) ×3 IMPLANT
ELECT REM PT RETURN 9FT ADLT (ELECTROSURGICAL) ×6
ELECTRODE REM PT RTRN 9FT ADLT (ELECTROSURGICAL) ×2 IMPLANT
FELT TEFLON 6X6 (MISCELLANEOUS) ×3 IMPLANT
FEMORAL VENOUS CANN RAP (CANNULA) IMPLANT
GAUZE SPONGE 4X4 12PLY STRL (GAUZE/BANDAGES/DRESSINGS) ×3 IMPLANT
GAUZE SPONGE 4X4 12PLY STRL LF (GAUZE/BANDAGES/DRESSINGS) ×3 IMPLANT
GLOVE BIO SURGEON STRL SZ7.5 (GLOVE) ×3 IMPLANT
GLOVE BIO SURGEON STRL SZ8 (GLOVE) ×6 IMPLANT
GLOVE EUDERMIC 7 POWDERFREE (GLOVE) ×3 IMPLANT
GLOVE ORTHO TXT STRL SZ7.5 (GLOVE) ×3 IMPLANT
GOWN STRL REUS W/ TWL LRG LVL3 (GOWN DISPOSABLE) ×3 IMPLANT
GOWN STRL REUS W/ TWL XL LVL3 (GOWN DISPOSABLE) ×6 IMPLANT
GOWN STRL REUS W/TWL LRG LVL3 (GOWN DISPOSABLE) ×6
GOWN STRL REUS W/TWL XL LVL3 (GOWN DISPOSABLE) ×12
GUIDEWIRE SAF TJ AMPL .035X180 (WIRE) ×3 IMPLANT
GUIDEWIRE SAFE TJ AMPLATZ EXST (WIRE) ×3 IMPLANT
GUIDEWIRE STRAIGHT .035 260CM (WIRE) ×3 IMPLANT
INSERT FOGARTY 61MM (MISCELLANEOUS) ×3 IMPLANT
INSERT FOGARTY SM (MISCELLANEOUS) IMPLANT
INSERT FOGARTY XLG (MISCELLANEOUS) IMPLANT
KIT BASIN OR (CUSTOM PROCEDURE TRAY) ×3 IMPLANT
KIT DILATOR VASC 18G NDL (KITS) IMPLANT
KIT HEART LEFT (KITS) ×3 IMPLANT
KIT ROOM TURNOVER OR (KITS) ×3 IMPLANT
KIT SUCTION CATH 14FR (SUCTIONS) ×6 IMPLANT
NEEDLE PERC 18GX7CM (NEEDLE) ×3 IMPLANT
NS IRRIG 1000ML POUR BTL (IV SOLUTION) ×9 IMPLANT
PACK AORTA (CUSTOM PROCEDURE TRAY) ×3 IMPLANT
PAD ARMBOARD 7.5X6 YLW CONV (MISCELLANEOUS) ×6 IMPLANT
PAD ELECT DEFIB RADIOL ZOLL (MISCELLANEOUS) ×3 IMPLANT
PATCH TACHOSII LRG 9.5X4.8 (VASCULAR PRODUCTS) IMPLANT
PERCLOSE PROGLIDE 6F (VASCULAR PRODUCTS) ×6
SET MICROPUNCTURE 5F STIFF (MISCELLANEOUS) ×3 IMPLANT
SHEATH AVANTI 11CM 8FR (MISCELLANEOUS) ×3 IMPLANT
SHEATH PINNACLE 6F 10CM (SHEATH) ×6 IMPLANT
SLEEVE REPOSITIONING LENGTH 30 (MISCELLANEOUS) ×3 IMPLANT
SPONGE LAP 4X18 X RAY DECT (DISPOSABLE) ×3 IMPLANT
STOPCOCK MORSE 400PSI 3WAY (MISCELLANEOUS) ×18 IMPLANT
SUT ETHIBOND X763 2 0 SH 1 (SUTURE) IMPLANT
SUT GORETEX CV 4 TH 22 36 (SUTURE) IMPLANT
SUT GORETEX CV4 TH-18 (SUTURE) IMPLANT
SUT GORETEX TH-18 36 INCH (SUTURE) IMPLANT
SUT MNCRL AB 3-0 PS2 18 (SUTURE) IMPLANT
SUT PROLENE 3 0 SH1 36 (SUTURE) IMPLANT
SUT PROLENE 4 0 RB 1 (SUTURE)
SUT PROLENE 4-0 RB1 .5 CRCL 36 (SUTURE) IMPLANT
SUT PROLENE 5 0 C 1 36 (SUTURE) IMPLANT
SUT PROLENE 6 0 C 1 30 (SUTURE) IMPLANT
SUT SILK  1 MH (SUTURE) ×2
SUT SILK 1 MH (SUTURE) ×1 IMPLANT
SUT SILK 2 0 SH CR/8 (SUTURE) IMPLANT
SUT VIC AB 2-0 CT1 27 (SUTURE)
SUT VIC AB 2-0 CT1 TAPERPNT 27 (SUTURE) IMPLANT
SUT VIC AB 2-0 CTX 36 (SUTURE) IMPLANT
SUT VIC AB 3-0 SH 8-18 (SUTURE) IMPLANT
SYR 10ML LL (SYRINGE) ×9 IMPLANT
SYR 30ML LL (SYRINGE) ×6 IMPLANT
SYR 50ML LL SCALE MARK (SYRINGE) ×3 IMPLANT
TOWEL OR 17X26 10 PK STRL BLUE (TOWEL DISPOSABLE) ×6 IMPLANT
TRANSDUCER W/STOPCOCK (MISCELLANEOUS) ×6 IMPLANT
TRAY FOLEY SILVER 14FR TEMP (SET/KITS/TRAYS/PACK) ×3 IMPLANT
TUBE SUCT INTRACARD DLP 20F (MISCELLANEOUS) IMPLANT
TUBING HIGH PRESSURE 120CM (CONNECTOR) ×3 IMPLANT
VALVE HEART TRANSCATH SZ3 26MM (Prosthesis & Implant Heart) ×3 IMPLANT
WIRE AMPLATZ SS-J .035X180CM (WIRE) ×3 IMPLANT
WIRE BENTSON .035X145CM (WIRE) ×3 IMPLANT

## 2017-11-04 NOTE — Anesthesia Procedure Notes (Addendum)
Central Venous Catheter Insertion Performed by: Dorris Singh, MD, anesthesiologist Start/End2/08/2018 1:50 PM, 11/04/2017 2:05 PM Patient location: Pre-op. Preanesthetic checklist: patient identified, IV checked, site marked, risks and benefits discussed, surgical consent, monitors and equipment checked, pre-op evaluation and timeout performed Position: Trendelenburg Lidocaine 1% used for infiltration and patient sedated Hand hygiene performed , maximum sterile barriers used  and Seldinger technique used Central line was placed.Double lumen Procedure performed using ultrasound guided technique. Ultrasound Notes:image(s) printed for medical record Attempts: 1 Following insertion, line sutured, dressing applied and Biopatch. Post procedure assessment: blood return through all ports  Patient tolerated the procedure well with no immediate complications.

## 2017-11-04 NOTE — Anesthesia Procedure Notes (Signed)
Arterial Line Insertion Start/End2/08/2018 1:00 PM, 11/04/2017 1:15 PM Performed by: Lucinda Dell, CRNA, CRNA  Preanesthetic checklist: patient identified, IV checked, risks and benefits discussed, surgical consent, monitors and equipment checked and pre-op evaluation Lidocaine 1% used for infiltration Right, radial was placed Catheter size: 20 G Hand hygiene performed  and maximum sterile barriers used  Allen's test indicative of satisfactory collateral circulation Attempts: 3 Procedure performed without using ultrasound guided technique. Following insertion, dressing applied and Biopatch. Post procedure assessment: normal  Patient tolerated the procedure well with no immediate complications. Additional procedure comments: Attempt x 1 by Irean Hong, CRNA on left radial artery. Attempt x 1 on left by Dionne Bucy, CRNA and successful insertion after 1 attempt on right radial artery.  Bruising and small hematoma noted on left wrist post procedure, light pressure dressing applied. Marland Kitchen

## 2017-11-04 NOTE — Progress Notes (Signed)
  Echocardiogram 2D Echocardiogram has been performed.  Benjamin Frost 11/04/2017, 3:52 PM

## 2017-11-04 NOTE — Anesthesia Preprocedure Evaluation (Addendum)
Anesthesia Evaluation  Patient identified by MRN, date of birth, ID band Patient awake    Reviewed: Allergy & Precautions, NPO status , Patient's Chart, lab work & pertinent test results  Airway Mallampati: II  TM Distance: >3 FB     Dental   Pulmonary COPD, former smoker,    breath sounds clear to auscultation       Cardiovascular hypertension, + dysrhythmias + pacemaker + Cardiac Defibrillator  Rhythm:Regular Rate:Normal + Systolic murmurs    Neuro/Psych    GI/Hepatic Neg liver ROS, GERD  ,  Endo/Other  negative endocrine ROS  Renal/GU negative Renal ROS     Musculoskeletal   Abdominal   Peds  Hematology   Anesthesia Other Findings   Reproductive/Obstetrics                            Anesthesia Physical Anesthesia Plan  ASA: IV  Anesthesia Plan: MAC   Post-op Pain Management:    Induction: Intravenous  PONV Risk Score and Plan: 1 and Treatment may vary due to age or medical condition  Airway Management Planned: Simple Face Mask  Additional Equipment:   Intra-op Plan:   Post-operative Plan:   Informed Consent: I have reviewed the patients History and Physical, chart, labs and discussed the procedure including the risks, benefits and alternatives for the proposed anesthesia with the patient or authorized representative who has indicated his/her understanding and acceptance.   Dental advisory given  Plan Discussed with: CRNA and Anesthesiologist  Anesthesia Plan Comments:         Anesthesia Quick Evaluation

## 2017-11-04 NOTE — Op Note (Signed)
[facility]   MULTIDISCIPLINARY HEART VALVE TEAM   TAVR OPERATIVE NOTE   Date of Procedure:  11/04/2017  Preoperative Diagnosis: Severe Aortic Stenosis   Postoperative Diagnosis: Same   Procedure:    Transcatheter Aortic Valve Replacement - Percutaneous  Transfemoral Approach  Edwards Sapien 3 THV (size 26 mm, model # 9600TFX, serial # [id number])   Co-Surgeons:  Alleen Borne, MD and [name], MD  Anesthesiologist:  Dorris Singh, MD  Echocardiographer:  Charlton Haws, MD  Pre-operative Echo Findings:  Severe aortic stenosis  Severe left ventricular systolic dysfunction  Post-operative Echo Findings:  No paravalvular leak  Unchanged left ventricular systolic function  BRIEF CLINICAL NOTE AND INDICATIONS FOR SURGERY  77 year old gentleman with hypertension, hyperlipidemia, and severe COPD.  He has developed severe low flow low gradient aortic stenosis.  The patient underwent cardiac resynchronization therapy last year with modest improvement in LV systolic function.  Transvalvular gradients increased from a mean gradient of 22 a mean gradient of 31 mmHg with some improvement in LV function to an LVEF of 40-45%.  The patient's dimensionless index was 0.21 with a calculated aortic valve area less than 0.8 cm.  A dobutamine echo had been attempted before CRT but the patient unfortunately sustained a ventricular fibrillation arrest during his dobutamine study.  Cardiac catheterization demonstrated no obstructive CAD.  CT angiogram studies demonstrated suitable anatomy for transfemoral TAVR.  During the course of the patient's preoperative work up they have been evaluated comprehensively by a multidisciplinary team of specialists coordinated through the Multidisciplinary [facility]ve Clinic in the [facility].  They have been demonstrated to suffer from symptomatic severe aortic stenosis as noted above. The patient has been counseled  extensively as to the relative risks and benefits of all options for the treatment of severe aortic stenosis including long term medical therapy, conventional surgery for aortic valve replacement, and transcatheter aortic valve replacement.  The patient has been independently evaluated by two cardiac surgeons including Dr. Cornelius Moras and [name], and they are felt to be at moderate risk for conventional surgical aortic valve replacement. Both surgeons indicated the patient would be a poor candidate for conventional surgery because of comorbidities including severe COPD, chronic systolic heart failure.   Based upon review of all of the patient's preoperative diagnostic tests they are felt to be candidate for transcatheter aortic valve replacement using the transfemoral approach as an alternative to high risk conventional surgery.    Following the decision to proceed with transcatheter aortic valve replacement, a discussion has been held regarding what types of management strategies would be attempted intraoperatively in the event of life-threatening complications, including whether or not the patient would be considered a candidate for the use of cardiopulmonary bypass and/or conversion to open sternotomy for attempted surgical intervention.  The patient has been advised of a variety of complications that might develop peculiar to this approach including but not limited to risks of death, stroke, paravalvular leak, aortic dissection or other major vascular complications, aortic annulus rupture, device embolization, cardiac rupture or perforation, acute myocardial infarction, arrhythmia, heart block or bradycardia requiring permanent pacemaker placement, congestive heart failure, respiratory failure, renal failure, pneumonia, infection, other late complications related to structural valve deterioration or migration, or other complications that might ultimately cause a temporary or permanent loss of functional  independence or other long term morbidity.  The patient provides full informed consent for the procedure as described and all questions were answered preoperatively.  DETAILS  OF THE OPERATIVE PROCEDURE  PREPARATION:   The patient is brought to the operating room on the above mentioned date and central monitoring was established by the anesthesia team including placement of a central venous catheter and radial arterial line. The patient is placed in the supine position on the operating table.  Intravenous antibiotics are administered. The patient is monitored closely throughout the procedure under conscious sedation.  Baseline transthoracic echocardiogram is performed. The patient's chest, abdomen, both groins, and both lower extremities are prepared and draped in a sterile manner. A time out procedure is performed.   PERIPHERAL ACCESS:   Using ultrasound guidance, femoral arterial and venous access is obtained with placement of 6 Fr sheaths on the left side.  A pigtail diagnostic catheter was passed through the femoral arterial sheath under fluoroscopic guidance into the aortic root.  A temporary transvenous pacemaker catheter was passed through the femoral venous sheath under fluoroscopic guidance into the right ventricle.  The pacemaker was tested to ensure stable lead placement and pacemaker capture. Aortic root angiography was performed in order to determine the optimal angiographic angle for valve deployment.  TRANSFEMORAL ACCESS:  A micropuncture technique is used to access the right femoral artery under fluoroscopic and ultrasound guidance.  2 Perclose devices are deployed at 10' and 2' positions to 'PreClose' the femoral artery. An 8 French sheath is placed and then an Amplatz Superstiff wire is advanced through the sheath. This is changed out for a 14 French transfemoral E-Sheath after progressively dilating over the Superstiff wire.  An AL-2 catheter was used to direct a straight-tip exchange  length wire across the native aortic valve into the left ventricle. This was exchanged out for a pigtail catheter and position was confirmed in the LV apex. Simultaneous LV and Ao pressures were recorded.  The pigtail catheter was exchanged for an Amplatz Extra-stiff wire in the LV apex.  Echocardiography was utilized to confirm appropriate wire position and no sign of entanglement in the mitral subvalvular apparatus.  TRANSCATHETER HEART VALVE DEPLOYMENT:  An Edwards Sapien 3 transcatheter heart valve (size 26 mm, model #9600TFX, serial #[id number]) was prepared and crimped per manufacturer's guidelines, and the proper orientation of the valve is confirmed on the [employer] delivery system. The valve was advanced through the introducer sheath using normal technique until in an appropriate position in the abdominal aorta beyond the sheath tip. The balloon was then retracted and using the fine-tuning wheel was centered on the valve. The valve was then advanced across the aortic arch using appropriate flexion of the catheter. The valve was carefully positioned across the aortic valve annulus. The Commander catheter was retracted using normal technique. Once final position of the valve has been confirmed by angiographic assessment, the valve is deployed while temporarily holding ventilation and during rapid ventricular pacing to maintain systolic blood pressure < 50 mmHg and pulse pressure < 10 mmHg. The balloon inflation is held for >3 seconds after reaching full deployment volume. Once the balloon has fully deflated the balloon is retracted into the ascending aorta and valve function is assessed using echocardiography. There is felt to be no paravalvular leak and no central aortic insufficiency.  The patient's hemodynamic recovery following valve deployment is good.  The deployment balloon and guidewire are both removed. Echo demostrated acceptable post-procedural gradients, stable mitral valve function, and  no aortic insufficiency.  PROCEDURE COMPLETION:  The sheath was removed and femoral artery closure is performed using the 2 previously deployed Perclose devices.  Protamine is  administered once femoral arterial repair was complete. The site is clear with no evidence of bleeding or hematoma after the sutures are tightened. The temporary pacemaker, pigtail catheters and femoral sheaths were removed with manual pressure used for hemostasis.   The patient tolerated the procedure well and is transported to the surgical intensive care in stable condition. There were no immediate intraoperative complications. All sponge instrument and needle counts are verified correct at completion of the operation.   The patient received a total of 31 mL of intravenous contrast during the procedure.  [name], MD 11/04/2017 4:43 PM

## 2017-11-04 NOTE — Interval H&P Note (Signed)
  [facility]ENTER   MULTIDISCIPLINARY HEART VALVE TEAM   History and Physical Interval Note:  11/04/2017, 1:12 PM   Benjamin Frost has presented today for surgery, with the diagnosis of severe aortic stenosis  The various methods of treatment have previously been discussed with the patient and family. After consideration of risks, benefits and other options for treatment, the patient has consented to  Procedure(s): Procedure(s): TRANSCATHETER AORTIC VALVE REPLACEMENT, TRANSFEMORAL (N/A) TRANSESOPHAGEAL ECHOCARDIOGRAM (TEE) (N/A) as a surgical intervention.   The patient reports the following:   Shortness of breath: Yes.   If yes: with what activity?: with everyday activity  Worse than previously noted?: No.  New edema, PND, orthopnea: No. Sleeps in a recliner chronically   Recent decrease in activity or worsening fatigue i.e. more difficulty walking to mailbox, climbing stairs, etc: No.  Changes since last seen in pre-op visit: No.      Component Value Date/Time   BNP 23.9 10/31/2017 1530     The patient's history has been reviewed, patient briefly examined, no change in status (or as listed above), stable for surgery.  I have reviewed the patient's chart and labs. Questions were answered to the patient's satisfaction.     Cline Crock PA-C, [name] and [name] to follow.   [name] 11/04/2017 2:51 PM

## 2017-11-04 NOTE — Anesthesia Postprocedure Evaluation (Signed)
Anesthesia Post Note  Patient: Benjamin Frost  Procedure(s) Performed: TRANSCATHETER AORTIC VALVE REPLACEMENT, TRANSFEMORAL (N/A Chest) TRANSESOPHAGEAL ECHOCARDIOGRAM (TEE) (N/A Chest)     Patient location during evaluation: PACU Anesthesia Type: MAC Level of consciousness: awake and alert Pain management: pain level controlled Vital Signs Assessment: post-procedure vital signs reviewed and stable Respiratory status: spontaneous breathing, nonlabored ventilation and respiratory function stable Cardiovascular status: stable and blood pressure returned to baseline Postop Assessment: no apparent nausea or vomiting Anesthetic complications: no    Last Vitals:  Vitals:   11/04/17 1645 11/04/17 1700  BP:  96/72  Pulse: 61 62  Resp: 14 18  Temp:    SpO2: 95% 94%    Last Pain:  Vitals:   11/04/17 1630  TempSrc:   PainSc: 0-No pain                 Marleen Moret,W. EDMOND

## 2017-11-04 NOTE — Transfer of Care (Signed)
Immediate Anesthesia Transfer of Care Note  Patient: Benjamin Frost  Procedure(s) Performed: TRANSCATHETER AORTIC VALVE REPLACEMENT, TRANSFEMORAL (N/A Chest) TRANSESOPHAGEAL ECHOCARDIOGRAM (TEE) (N/A Chest)  Patient Location: ICU  Anesthesia Type:MAC  Level of Consciousness: awake, alert  and oriented  Airway & Oxygen Therapy: Patient Spontanous Breathing and Patient connected to face mask oxygen  Post-op Assessment: Report given to RN and Post -op Vital signs reviewed and stable  Post vital signs: Reviewed and stable  Last Vitals:  Vitals:   11/04/17 1600 11/04/17 1613  BP:  (!) 148/124  Pulse: (!) 0   Resp:  12  Temp:    SpO2:      Last Pain:  Vitals:   11/04/17 1046  TempSrc: Oral         Complications: No apparent anesthesia complications

## 2017-11-04 NOTE — Op Note (Signed)
[facility] VASC[facility]   MULTIDISCIPLINARY HEART VALVE TEAM   TAVR OPERATIVE NOTE   Date of Procedure:  11/04/2017  Preoperative Diagnosis: Severe Aortic Stenosis   Postoperative Diagnosis: Same   Procedure:    Transcatheter Aortic Valve Replacement - Percutaneous Right Transfemoral Approach  Edwards Sapien 3 THV (size 26 mm, model # 9600TFX, serial # [id number])   Co-Surgeons: Alleen Borne, MD  and [name], MD   Anesthesiologist:  Dorris Singh, MD  Echocardiographer:  Charlton Haws, MD  Pre-operative Echo Findings:  Severe low gradient, low EF aortic stenosis  Severe left ventricular systolic dysfunction  Post-operative Echo Findings:  no paravalvular leak  Unchanged severe left ventricular systolic dysfunction   BRIEF CLINICAL NOTE AND INDICATIONS FOR SURGERY  The patient is a 77 year old gentleman with a history of hypertension, hyperlipidemia, severe COPD on home oxygen at night with a remote history of smoking, nonischemic cardiomyopathy with an ejection fraction as low as 15% by echocardiogram in 11/2013, left bundle branch block, and a history of aortic stenosis.  He has a long-standing history of exertional shortness of breath that has been attributed to a combination of severe COPD, moderate aortic stenosis, and nonischemic cardiomyopathy with severe left ventricular dysfunction.  For the past several years he has been followed by [name].  An echocardiogram in July 2018 showed an ejection fraction of 25-30%.  The peak velocity across aortic valve was 3.0 m/s with a mean transvalvular gradient of 20 mmHg.  He underwent a dobutamine stress test in an effort to determine if this was really low ejection fraction severe aortic stenosis.  Unfortunately he suffered a ventricular fibrillation arrest during the procedure but was promptly resuscitated and recovered uneventfully.  Cardiac catheterization the following day showed no significant coronary disease  with mildly elevated filling pressures.  The mean transvalvular gradient across aortic valve was 20 mmHg with a valve area calculated at 1.4 cm.  He then underwent insertion of a biventricular pacemaker/defibrillator on 05/29/2017.  He feels like he improved somewhat with that although he continued to have significant exertional shortness of breath.  A follow-up echocardiogram afterwards showed an ejection fraction that had improved to 40-45% with biventricular pacing.  There is felt to be severe aortic stenosis with a peak velocity across the aortic valve of 3.5 m/s with a transvalvular gradient of 31 mmHg.  The dimensionless index was 0.21 with a calculated aortic valve area of 0.72-0.84 cm.  He has stage D severe, symptomatic, low gradient, low EF, aortic stenosis with progressive symptoms of exertion shortness of breath consistent with chronic combined systolic and diastolic heart failure, NYHA class II. His chronic shortness of breath is no doubt multifactorial due to his severe COPD, reduced EF, and aortic stenosis but it has gotten progressively worse recently despite improvement in his EF after a biventricular pacer. He quit smoking many years ago.  I have personally reviewed his echocardiogram, cardiac catheterization, and CTA studies.  His echocardiogram shows a trileaflet aortic valve with severe thickening, calcification, and restricted leaflet mobility.  The mean transvalvular gradient is 30 mmHg with severe left ventricular dysfunction.  Cardiac catheterization shows no significant coronary disease.  I agree that aortic valve replacement is indicated in this patient and I would expect it to improve his shortness of breath.  He would be at moderate risk for open surgical aortic valve replacement due to his age, severe left ventricular dysfunction, and severe COPD.  I think TAVR would be a reasonable alternative for this  patient.  His gated cardiac CTA shows anatomy suitable for transcatheter aortic  valve replacement without any significant complicating features.  The CTA of the abdomen and pelvis shows adequate pelvic vasculature to allow transfemoral insertion.  The patient and his wife were counseled at length regarding treatment alternatives for management of severe symptomatic aortic stenosis. The risks and benefits of surgical intervention has been discussed in detail. Long-term prognosis with medical therapy was discussed. Alternative approaches such as conventional surgical aortic valve replacement, transcatheter aortic valve replacement, and palliative medical therapy were compared and contrasted at length. This discussion was placed in the context of the patient's own specific clinical presentation and past medical history. All of their questions have been addressed. The patient is eager to proceed with TAVR as soon as possible.   Following the decision to proceed with transcatheter aortic valve replacement, a discussion was held regarding what types of management strategies would be attempted intraoperatively in the event of life-threatening complications, including whether or not the patient would be considered a candidate for the use of cardiopulmonary bypass and/or conversion to open sternotomy for attempted surgical intervention. The patient has been advised of a variety of complications that might develop including but not limited to risks of death, stroke, paravalvular leak, aortic dissection or other major vascular complications, aortic annulus rupture, device embolization, cardiac rupture or perforation, mitral regurgitation, acute myocardial infarction, arrhythmia, heart block or bradycardia requiring permanent pacemaker placement, congestive heart failure, respiratory failure, renal failure, pneumonia, infection, other late complications related to structural valve deterioration or migration, or other complications that might ultimately cause a temporary or permanent loss of  functional independence or other long term morbidity. The patient provides full informed consent for the procedure as described and all questions were answered.       DETAILS OF THE OPERATIVE PROCEDURE  PREPARATION:    The patient is brought to the operating room on the above mentioned date and central monitoring was established by the anesthesia team including placement of a central venous line and radial arterial line. The patient is placed in the supine position on the operating table.  Intravenous antibiotics are administered. The patient is monitored closely throughout the procedure under conscious sedation.    Baseline transthoracic echocardiogram was performed. The patient's chest, abdomen, both groins, and both lower extremities are prepared and draped in a sterile manner. A time out procedure is performed.   PERIPHERAL ACCESS:    Using the modified Seldinger technique, femoral arterial and venous access was obtained with placement of 6 Fr sheaths on the left side.  A pigtail diagnostic catheter was passed through the left arterial sheath under fluoroscopic guidance into the aortic root.  A temporary transvenous pacemaker catheter was passed through the left femoral venous sheath under fluoroscopic guidance into the right ventricle.  The pacemaker was tested to ensure stable lead placement and pacemaker capture. Aortic root angiography was performed in order to determine the optimal angiographic angle for valve deployment.   TRANSFEMORAL ACCESS:   Percutaneous transfemoral access and sheath placement was performed by [name] using ultrasound guidance.  The right common femoral artery was cannulated using a micropuncture needle and appropriate location was verified using hand injection angiogram.  A pair of Abbott Perclose percutaneous closure devices were placed and a 6 French sheath replaced into the femoral artery.  The patient was heparinized systemically and ACT verified > 250  seconds.    A 14 Fr transfemoral E-sheath was introduced into the right femoral artery  after progressively dilating over an Amplatz superstiff wire. An AL-1 catheter was used to direct a straight-tip exchange length wire across the native aortic valve into the left ventricle. This was exchanged out for a pigtail catheter and position was confirmed in the LV apex. Simultaneous LV and Ao pressures were recorded.  The pigtail catheter was exchanged for an Amplatz Extra-stiff wire in the LV apex.    BALLOON AORTIC VALVULOPLASTY:   Not performed    TRANSCATHETER HEART VALVE DEPLOYMENT:   An Edwards Sapien 3 transcatheter heart valve (size 26 mm, model #9600TFX, serial #[id number]) was prepared and crimped per manufacturer's guidelines, and the proper orientation of the valve is confirmed on the [employer] delivery system. The valve was advanced through the introducer sheath using normal technique until in an appropriate position in the abdominal aorta beyond the sheath tip. The balloon was then retracted and using the fine-tuning wheel was centered on the valve. The valve was then advanced across the aortic arch using appropriate flexion of the catheter. The valve was carefully positioned across the aortic valve annulus. The Commander catheter was retracted using normal technique. Once final position of the valve has been confirmed by angiographic assessment, the valve is deployed while temporarily holding ventilation and during rapid ventricular pacing to maintain systolic blood pressure < 50 mmHg and pulse pressure < 10 mmHg. The balloon inflation is held for >3 seconds after reaching full deployment volume. Once the balloon has fully deflated the balloon is retracted into the ascending aorta and valve function is assessed using echocardiography. There is felt to be no paravalvular leak and no central aortic insufficiency.  The patient's hemodynamic recovery following valve deployment is good.  The  deployment balloon and guidewire are both removed.    PROCEDURE COMPLETION:   The sheath was removed and femoral artery closure performed by Dr Excell Seltzer.  Protamine was administered once femoral arterial repair was complete. The temporary pacemaker, pigtail catheters and femoral sheaths were removed with manual pressure used for hemostasis.   The patient tolerated the procedure well and is transported to the surgical intensive care in stable condition. There were no immediate intraoperative complications. All sponge instrument and needle counts are verified correct at completion of the operation.   No blood products were administered during the operation.  The patient received a total of 31.3 mL of intravenous contrast during the procedure.   Alleen Borne, MD 11/04/2017 4:13 PM

## 2017-11-04 NOTE — Progress Notes (Signed)
  [facility]   MULTIDISCIPLINARY HEART VALVE TEAM  Patient doing well s/p TAVR. He is hemodynamically stable. Groin sites stable. ECG with no high grade block (he has an ICD/pacemaker). Early ambulation and plan to transfer to tele tomorrow. If he continues to do well, possible DC home tomorrow.   Cline Crock PA-C  [facility]  Pager [phone]

## 2017-11-05 ENCOUNTER — Encounter (HOSPITAL_COMMUNITY): Payer: Self-pay | Admitting: Physician Assistant

## 2017-11-05 ENCOUNTER — Inpatient Hospital Stay (HOSPITAL_COMMUNITY): Payer: Medicare Other

## 2017-11-05 ENCOUNTER — Other Ambulatory Visit: Payer: Self-pay

## 2017-11-05 DIAGNOSIS — I350 Nonrheumatic aortic (valve) stenosis: Secondary | ICD-10-CM

## 2017-11-05 DIAGNOSIS — J449 Chronic obstructive pulmonary disease, unspecified: Secondary | ICD-10-CM | POA: Diagnosis present

## 2017-11-05 DIAGNOSIS — Z95810 Presence of automatic (implantable) cardiac defibrillator: Secondary | ICD-10-CM | POA: Diagnosis present

## 2017-11-05 DIAGNOSIS — Z952 Presence of prosthetic heart valve: Secondary | ICD-10-CM

## 2017-11-05 DIAGNOSIS — I5022 Chronic systolic (congestive) heart failure: Secondary | ICD-10-CM

## 2017-11-05 DIAGNOSIS — E785 Hyperlipidemia, unspecified: Secondary | ICD-10-CM | POA: Diagnosis present

## 2017-11-05 LAB — ECHOCARDIOGRAM COMPLETE
AOASC: 33 cm
AOPV: 0.37 m/s
AV Area mean vel: 1.35 cm2
AV peak Index: 0.6
AV vel: 1.35
AVA: 1.35 cm2
AVAREAMEANVIN: 0.63 cm2/m2
AVAREAVTI: 1.28 cm2
AVAREAVTIIND: 0.63 cm2/m2
AVCELMEANRAT: 0.39
AVG: 13 mmHg
AVPG: 28 mmHg
AVPKVEL: 263 cm/s
CHL CUP AV VALUE AREA INDEX: 0.63
DOP CAL AO MEAN VELOCITY: 165 cm/s
EERAT: 14.07
EWDT: 254 ms
FS: 17 % — AB (ref 28–44)
Height: 70 in
IV/PV OW: 1.58
LA ID, A-P, ES: 41 mm
LA diam index: 1.92 cm/m2
LA vol A4C: 56.6 ml
LA vol index: 29.5 mL/m2
LA vol: 63.1 mL
LDCA: 3.46 cm2
LEFT ATRIUM END SYS DIAM: 41 mm
LV TDI E'LATERAL: 7.18
LV TDI E'MEDIAL: 6.57
LV e' LATERAL: 7.18 cm/s
LVEEAVG: 14.07
LVEEMED: 14.07
LVOT SV: 80 mL
LVOT VTI: 23 cm
LVOT peak VTI: 0.39 cm
LVOTD: 21 mm
LVOTPV: 97 cm/s
Lateral S' vel: 12.8 cm/s
MV Dec: 254
MVPG: 4 mmHg
MVPKAVEL: 105 m/s
MVPKEVEL: 101 m/s
PW: 7.01 mm — AB (ref 0.6–1.1)
RV TAPSE: 21.4 mm
VTI: 59 cm
Weight: 3209.6 oz

## 2017-11-05 LAB — CBC
HEMATOCRIT: 39.2 % (ref 39.0–52.0)
Hemoglobin: 12.9 g/dL — ABNORMAL LOW (ref 13.0–17.0)
MCH: 30.6 pg (ref 26.0–34.0)
MCHC: 32.9 g/dL (ref 30.0–36.0)
MCV: 93.1 fL (ref 78.0–100.0)
PLATELETS: 90 10*3/uL — AB (ref 150–400)
RBC: 4.21 MIL/uL — ABNORMAL LOW (ref 4.22–5.81)
RDW: 13.2 % (ref 11.5–15.5)
WBC: 7.4 10*3/uL (ref 4.0–10.5)

## 2017-11-05 LAB — BASIC METABOLIC PANEL
ANION GAP: 11 (ref 5–15)
BUN: 10 mg/dL (ref 6–20)
CALCIUM: 8.6 mg/dL — AB (ref 8.9–10.3)
CO2: 25 mmol/L (ref 22–32)
CREATININE: 0.7 mg/dL (ref 0.61–1.24)
Chloride: 102 mmol/L (ref 101–111)
GLUCOSE: 96 mg/dL (ref 65–99)
Potassium: 4 mmol/L (ref 3.5–5.1)
Sodium: 138 mmol/L (ref 135–145)

## 2017-11-05 LAB — MAGNESIUM: Magnesium: 1.9 mg/dL (ref 1.7–2.4)

## 2017-11-05 MED ORDER — SPIRONOLACTONE 25 MG PO TABS
25.0000 mg | ORAL_TABLET | Freq: Every day | ORAL | Status: DC
  Administered 2017-11-05: 25 mg via ORAL
  Filled 2017-11-05: qty 1

## 2017-11-05 MED ORDER — CARVEDILOL 3.125 MG PO TABS
3.1250 mg | ORAL_TABLET | Freq: Two times a day (BID) | ORAL | Status: DC
  Administered 2017-11-05: 3.125 mg via ORAL
  Filled 2017-11-05: qty 1

## 2017-11-05 MED ORDER — CLOPIDOGREL BISULFATE 75 MG PO TABS: 75.0000 mg | ORAL_TABLET | Freq: Every day | ORAL | 5 refills | Status: DC

## 2017-11-05 MED ORDER — PANTOPRAZOLE SODIUM 40 MG PO TBEC: 40.0000 mg | DELAYED_RELEASE_TABLET | Freq: Every day | ORAL | 6 refills | Status: DC

## 2017-11-05 MED ORDER — LOSARTAN POTASSIUM 25 MG PO TABS: 25.0000 mg | ORAL_TABLET | Freq: Every day | ORAL | Status: DC

## 2017-11-05 MED FILL — Potassium Chloride Inj 2 mEq/ML: INTRAVENOUS | Qty: 40 | Status: AC

## 2017-11-05 MED FILL — Sodium Chloride IV Soln 0.9%: INTRAVENOUS | Qty: 250 | Status: AC

## 2017-11-05 MED FILL — Magnesium Sulfate Inj 50%: INTRAMUSCULAR | Qty: 10 | Status: AC

## 2017-11-05 MED FILL — Phenylephrine HCl Inj 10 MG/ML: INTRAMUSCULAR | Qty: 2 | Status: AC

## 2017-11-05 MED FILL — Heparin Sodium (Porcine) Inj 1000 Unit/ML: INTRAMUSCULAR | Qty: 30 | Status: AC

## 2017-11-05 NOTE — Discharge Instructions (Signed)

## 2017-11-05 NOTE — Discharge Summary (Addendum)
HEART AND VASCULAR CENTER   MULTIDISCIPLINARY HEART VALVE TEAM   Discharge Summary    Patient ID: Benjamin Frost,  MRN: [id number], DOB/AGE: 1941/03/03 77 y.o.  Admit date: 11/04/2017 Discharge date: 11/05/2017  Primary Care Provider: Rory Percy Primary Cardiologist: [name] / [name] (EP) / Dr Excell Seltzer & [name] (TAVR)   Discharge Diagnoses    Principal Problem:   S/P TAVR (transcatheter aortic valve replacement) Active Problems:   Chronic systolic heart failure (HCC)   Left bundle branch block   Nonischemic cardiomyopathy (HCC)   Hypertension   COPD (chronic obstructive pulmonary disease) (HCC)   AICD (automatic cardioverter/defibrillator) present   Hyperlipidemia   Aortic stenosis, severe   Allergies Allergies  Allergen Reactions  . Lisinopril Hives       . Amlodipine     UNSPECIFIED REACTION   . Dobutamine     VF arrest during dobutamine echo  . Metoprolol Nausea And Vomiting          History of Present Illness     Benjamin Frost is a 77 y.o. male with a history of chronic systolic CHF, LBBB, VF arrest s/p BiV ICD, NICM, HTN, HLD, moderate MR, sinus tachycardia, severe COPD and low gradient low ejection fraction aortic stenosis who presented to [facility] on 11/04/17 for planned TAVR   The patient has a long-standing history of symptoms of exertional shortness of breath that has been attributed to a combination of severe COPD, moderate aortic stenosis and nonischemic cardiomyopathy.  For the last several years he has been followed by [name].  Echocardiogram performed in July 2018 revealed severe left ventricular systolic dysfunction with ejection fraction estimated 25-30%.  Peak velocity across the aortic valve was measured 3.0 m/s corresponding to mean transvalvular gradient estimated 20 mmHg.  He was referred for dobutamine stress test which was performed on May 15, 2017.  During dobutamine infusion the patient suffered VF arrest.  He was promptly  resuscitated and recovered uneventfully.  Catheterization performed the following day revealed no angiographically significant coronary artery disease with mildly elevated filling pressures.  Mean transvalvular gradient across the aortic valve was measured 20 mmHg corresponding to aortic valve area calculated 1.4 cm.  He subsequently underwent implantation of a biventricular pacemaker ICD May 29, 2017.  Since then the patient has remained clinically stable although he continues to complain of significant exertional shortness of breath.  He was seen in follow-up recently by [name] and follow-up echocardiogram demonstrated some improvement in left ventricular ejection fraction to 40-45% with biventricular pacing.  There was felt to be severe aortic stenosis with peak velocity across the aortic valve reported 3.5 m/s corresponding to mean transvalvular gradient estimated 31 mmHg setting of moderate to severe left ventricular systolic dysfunction.  The DVI was reported 0.21 and aortic valve area estimated between 0.72 and 0.84 cm.   He was evaluated by the multidisciplinary valve team and felt to be a good candidate for TAVR, which was set up for 11/04/17.    Hospital Course     Consultants: none  Low gradient, Low Flow Severe AS: s/p successful TAVR with a 26mm Edwards Sapein THV via TF approach on 11/04/17. Post operative echo reviewed by [name] and stable, but not formally read at the time of discharge. Groin sites are stable. He will be discharged on ASA and plavix. I will see him back in 1 week for a TOC visit and [name] will see him back at 1 month in the valve  clinic with an echo.   Chronic systolic CHF: EF 22-48% by intraoperative echo. He appears euvolemic. Continue home CHF regimen of Coreg, Dixogin, Losartan, Spiro and Ivabradine.   HTN: BP has been well controlled.   VF arrest s/p BiV ICD: followed by [name]   Thrombocytopenia: excepted post operatively. Stable  The  patient has had an uncomplicated hospital course and is recovering well. The femoral catheter sites are stable. He has been seen by [name] today and deemed ready for discharge home. All follow-up appointments have been scheduled. Discharge medications are listed below.  _____________  Discharge Vitals Blood pressure (!) 110/59, pulse 84, temperature 98.1 F (36.7 C), temperature source Oral, resp. rate 10, height 5\' 10"  (1.778 m), weight 200 lb 9.6 oz (91 kg), SpO2 97 %.  Filed Weights   11/04/17 1046 11/04/17 1102 11/05/17 0500  Weight: 202 lb (91.6 kg) 202 lb (91.6 kg) 200 lb 9.6 oz (91 kg)   VS:  BP (!) 110/59   Pulse 84   Temp 98.1 F (36.7 C) (Oral)   Resp 10   Ht 5\' 10"  (1.778 m)   Wt 200 lb 9.6 oz (91 kg)   SpO2 97%   BMI 28.78 kg/m    GEN: Well nourished, well developed, in no acute distress  HEENT: normal  Neck: no JVD, carotid bruits, or masses Cardiac: RRR; no murmurs, rubs, or gallops,no edema  Respiratory:  clear to auscultation bilaterally, normal work of breathing GI: soft, nontender, nondistended, + BS MS: no deformity or atrophy  Skin: warm and dry, no rash Neuro:  Alert and Oriented x 3, Strength and sensation are intact Psych: euthymic mood, full affect  Labs & Radiologic Studies     CBC Recent Labs    11/04/17 1630 11/04/17 1644 11/05/17 0445  WBC 7.9  --  7.4  HGB 13.1 12.2* 12.9*  HCT 39.9 36.0* 39.2  MCV 93.4  --  93.1  PLT 108*  --  90*   Basic Metabolic Panel Recent Labs    25/00/37 1549 11/04/17 1644 11/05/17 0445  NA 140 140 138  K 3.9 3.8 4.0  CL 99*  --  102  CO2  --   --  25  GLUCOSE 115* 105* 96  BUN 12  --  10  CREATININE 0.70  --  0.70  CALCIUM  --   --  8.6*  MG  --   --  1.9   Liver Function Tests No results for input(s): AST, ALT, ALKPHOS, BILITOT, PROT, ALBUMIN in the last 72 hours. No results for input(s): LIPASE, AMYLASE in the last 72 hours. Cardiac Enzymes No results for input(s): CKTOTAL, CKMB, CKMBINDEX,  TROPONINI in the last 72 hours. BNP Invalid input(s): POCBNP D-Dimer No results for input(s): DDIMER in the last 72 hours. Hemoglobin A1C No results for input(s): HGBA1C in the last 72 hours. Fasting Lipid Panel No results for input(s): CHOL, HDL, LDLCALC, TRIG, CHOLHDL, LDLDIRECT in the last 72 hours. Thyroid Function Tests No results for input(s): TSH, T4TOTAL, T3FREE, THYROIDAB in the last 72 hours.  Invalid input(s): FREET3  Dg Chest 2 View  Result Date: 10/31/2017 CLINICAL DATA:  Severe aortic stenosis preop evaluation. EXAM: CHEST  2 VIEW COMPARISON:  07/14/2017 FINDINGS: Heart size normal. Transvenous pacemaker unchanged. Negative for heart failure. COPD with hyperinflation. Lungs are clear without infiltrate effusion or mass. IMPRESSION: COPD without acute abnormality. Electronically Signed   By: Marlan Palau M.D.   On: 10/31/2017 16:36   Dg  Chest Port 1 View  Result Date: 11/04/2017 CLINICAL DATA:  Status post transcatheter aortic valve replacement. EXAM: PORTABLE CHEST 1 VIEW COMPARISON:  Radiographs of October 31, 2017. FINDINGS: Stable cardiomediastinal silhouette. Left-sided pacemaker is unchanged in position. Interval placement of right internal jugular catheter with distal tip in expected position of the SVC. No pneumothorax or pleural effusion is noted. Stable bibasilar subsegmental atelectasis or scarring is noted. Bony thorax is unremarkable. IMPRESSION: Stable bibasilar subsegmental atelectasis or scarring. Electronically Signed   By: Lupita Raider, M.D.   On: 11/04/2017 16:27     Diagnostic Studies/Procedures    TAVR OPERATIVE NOTE   Date of Procedure:                11/04/2017  Preoperative Diagnosis:      Severe Aortic Stenosis   Postoperative Diagnosis:    Same   Procedure:        Transcatheter Aortic Valve Replacement - Percutaneous  Transfemoral Approach             Edwards Sapien 3 THV (size 26 mm, model # 9600TFX, serial # [id number])               Co-Surgeons:                        Alleen Borne, MD and [name], MD  Pre-operative Echo Findings: ? Severe aortic stenosis ? Severe left ventricular systolic dysfunction  Post-operative Echo Findings: ? No paravalvular leak ? Unchanged left ventricular systolic function  _____________  Post operative echo 11/05/17: formal read pending at the time of discharge  Disposition   Pt is being discharged home today in good condition.  Follow-up Plans & Appointments    Follow-up Information    Janetta Hora, PA-C. Go on 11/12/2017.   Specialties:  Cardiology, Radiology Why:  @ 1:30pm  Contact information: [address] [place] [address] [phone]            Discharge Medications     Medication List    STOP taking these medications   omeprazole 20 MG capsule Commonly known as:  PRILOSEC     TAKE these medications   aspirin 81 MG tablet Take 81 mg by mouth daily.   atorvastatin 40 MG tablet Commonly known as:  LIPITOR Take 40 mg by mouth daily.   carvedilol 3.125 MG tablet Commonly known as:  COREG Take 1 tablet (3.125 mg total) by mouth 2 (two) times daily.   clopidogrel 75 MG tablet Commonly known as:  PLAVIX Take 1 tablet (75 mg total) by mouth daily with breakfast. Start taking on:  11/06/2017   digoxin 0.125 MG tablet Commonly known as:  LANOXIN Take 1 tablet (0.125 mg total) by mouth daily.   Fluticasone-Salmeterol 250-50 MCG/DOSE Aepb Commonly known as:  ADVAIR Inhale 1 puff into the lungs 2 (two) times daily.   ivabradine 5 MG Tabs tablet Commonly known as:  CORLANOR Take 1 tablet (5 mg total) by mouth 2 (two) times daily with a meal.   losartan 25 MG tablet Commonly known as:  COZAAR Take 1 tablet (25 mg total) by mouth daily.   pantoprazole 40 MG tablet Commonly known as:  PROTONIX Take 1 tablet (40 mg total) by mouth daily.   PROAIR HFA 108 (90 Base) MCG/ACT inhaler Generic drug:   albuterol Inhale 2 puffs into the lungs every 6 (six) hours as needed for wheezing or shortness of breath.  spironolactone 25 MG tablet Commonly known as:  ALDACTONE Take 1 tablet (25 mg total) by mouth daily.   tamsulosin 0.4 MG Caps capsule Commonly known as:  FLOMAX Take 0.4 mg by mouth daily.   tiotropium 18 MCG inhalation capsule Commonly known as:  SPIRIVA Place 18 mcg into inhaler and inhale daily.         Outstanding Labs/Studies   none  Duration of Discharge Encounter   Greater than 30 minutes including physician time.  Signed, Cline Crock PA-C 11/05/2017, 11:24 AM   Patient seen, examined. Available data reviewed. Agree with findings, assessment, and plan as outlined by Carlean Jews, PA-C. On exam he is alert, oriented in NAD, lungs CTA, heart distant and regular with no murmur, extremities without edema. The left groin site has ecchymoses without firm hematoma. POD#1 echo is reviewed and shows moderate-severe LV systolic dysfunction with normal function of the TAVR valve. There is no paravalvular leak. Formal interpretation is pending.  Pt stable for hospital discharge today. FU arranged as above. He will take ASA and plavix x 6 months post-TAVR.  [name], M.D. 11/05/2017 1:10 PM

## 2017-11-05 NOTE — Progress Notes (Signed)
CARDIAC REHAB PHASE I   PRE:  Rate/Rhythm: 82 SR    BP: sitting 129/64    SaO2:   MODE:  Ambulation: 270 ft   POST:  Rate/Rhythm: 102 ST with PVCs    BP: sitting 129/53     SaO2: 100 RA  Pt to bathroom then walked. Steady, no c/o. SOB with distance but sts this is his norm. Did not seem significant. Discussed restrictions, IS, walking daily, and CRPII. Will send referral to [place] [facility].  [id number]   Harriet Masson CES, ACSM 11/05/2017 1:17 PM

## 2017-11-05 NOTE — Progress Notes (Signed)
  Echocardiogram 2D Echocardiogram has been performed.  Benjamin Frost 11/05/2017, 10:18 AM

## 2017-11-06 ENCOUNTER — Telehealth: Payer: Self-pay | Admitting: Physician Assistant

## 2017-11-06 ENCOUNTER — Encounter: Payer: Self-pay | Admitting: Thoracic Surgery (Cardiothoracic Vascular Surgery)

## 2017-11-06 NOTE — Telephone Encounter (Signed)
  HEART AND VASCULAR CENTER   MULTIDISCIPLINARY HEART VALVE TEAM   Patient contacted regarding discharge from [facility] on 11/05/17  Patient understands to follow up with provider Carlean Jews on 2/20 @1 :30pm at [facility].  Patient understands discharge instructions? yes Patient understands medications and regiment? yes Patient understands to bring all medications to this visit? yes  Cline Crock PA-C  [facility]

## 2017-11-10 NOTE — Progress Notes (Signed)
HEART AND VASCULAR CENTER   MULTIDISCIPLINARY HEART VALVE CLINIC                                       Cardiology Office Note    Date:  11/12/2017   ID:  Benjamin Frost, DOB 1940-12-07, MRN [id number]  PCP:  Rory Percy, MD  Cardiologist:  [name] / [name] (EP) / Dr Excell Seltzer & [name] (TAVR)  CC: TOC visit s/p TAVR  History of Present Illness:  Benjamin Frost is a 77 y.o. male with a history of chronic systolic CHF, LBBB, VF arrest s/p BiV ICD, NICM, HTN, HLD, moderate MR, sinus tachycardia, severe COPD and low gradient, low EF aortic stenosis s/p TAVR (11/04/17) who presents to clinic for follow up.   The patient has a long-standing history of symptoms of exertional shortness of breath that has been attributed to a combination of severe COPD, moderate aortic stenosisand nonischemic cardiomyopathy. For the last several years he has been followed by [name]. Echocardiogram performed in July 2018 revealed severe left ventricular systolic dysfunction with ejection fraction estimated 25-30%. Peak velocity across the aortic valve was measured 3.0 m/s corresponding to mean transvalvular gradient estimated 20 mmHg. He was referred for dobutamine stress test which was performed on May 15, 2017. During dobutamine infusion the patient suffered VF arrest. He was promptly resuscitated and recovered uneventfully.Catheterization performed the following day revealed no angiographically significant coronary artery disease with mildly elevated filling pressures. Mean transvalvular gradient across the aortic valve was measured 20 mmHg corresponding to aortic valve area calculated 1.4 cm. He subsequently underwent implantation of a biventricular pacemaker ICD May 29, 2017. Since then the patient remained clinically stable although he continued to complain of significant exertional shortness of breath. He was seen in follow-up recently by [name] and follow-up echocardiogram  demonstrated some improvement in left ventricular ejection fraction to 40-45% with biventricular pacing. There was felt to be severe aortic stenosis with peak velocity across the aortic valve reported 3.5 m/s corresponding to mean transvalvular gradient estimated 31 mmHg setting of moderate to severe left ventricular systolic dysfunction. The DVI was reported 0.21and aortic valve area estimated between 0.72 and 0.84 cm. He was seen by the multidisciplinary valve team and set up for TAVR.  He underwent successful TAVR with a 26mm Edwards Sapein THV via TF approach on 11/04/17. Post operative echo showed moderate-severe LV systolic dysfunction (EF 30-35%) with normal function of the TAVR valve with a mean gradient of 13 mmHg. His post op course was unremarkable and he was discharged home the following day.   Today he presents to clinic for follow up. No CP but he still has some mild SOB with exertion so he is not very active. He thinks overall this has improved. His chest tightness has definitely improved. No LE edema, orthopnea or PND. No dizziness or syncope. No blood in stool or urine. No palpitations.     Past Medical History:  Diagnosis Date  . AICD (automatic cardioverter/defibrillator) present    a. St Jude, placed after VF arrest during a dobuatamine echo.   . Aortic stenosis    a. mod-severe low flow low gradient AS.   Marland Kitchen Aortic stenosis, severe    a. 11/04/17: s/p TAVR by [name] and [name].   . Chronic systolic heart failure (HCC)   . COPD (chronic obstructive pulmonary disease) (HCC)   .  GERD (gastroesophageal reflux disease)   . History of BPH   . History of kidney stones   . Hyperlipidemia   . Hypertension   . Left bundle branch block   . S/P TAVR (transcatheter aortic valve replacement)    a. 10/2017: s/p TAVR with an Edwards Sapien 3 THV (size 26 mm, model # 9600TFX, serial # [id number])  . Sinus tachycardia     Past Surgical History:  Procedure Laterality Date  . BIV  ICD INSERTION CRT-D N/A 05/29/2017   SJM Quadra Assura implanted by Dr Johney Frame for secondary prevention of sudden death  . CARDIAC CATHETERIZATION  2012   [facility]  . CARDIAC CATHETERIZATION     [facility]  . CATARACT EXTRACTION, BILATERAL    . LEFT AND RIGHT HEART CATHETERIZATION WITH CORONARY ANGIOGRAM N/A 03/23/2014   Procedure: LEFT AND RIGHT HEART CATHETERIZATION WITH CORONARY ANGIOGRAM;  Surgeon: [name], MD;  Location: [facility] CATH LAB;  Service: Cardiovascular;  Laterality: N/A;  . LEG SURGERY Left    surgery d/t injury from nail gun  . RIGHT/LEFT HEART CATH AND CORONARY ANGIOGRAPHY N/A 05/16/2017   Procedure: RIGHT/LEFT HEART CATH AND CORONARY ANGIOGRAPHY;  Surgeon: [name], MD;  Location: [facility] INVASIVE CV LAB;  Service: Cardiovascular;  Laterality: N/A;  . TEE WITHOUT CARDIOVERSION N/A 11/04/2017   Procedure: TRANSESOPHAGEAL ECHOCARDIOGRAM (TEE);  Surgeon: [name], MD;  Location: [facility] OR;  Service: Open Heart Surgery;  Laterality: N/A;  . TRANSCATHETER AORTIC VALVE REPLACEMENT, TRANSFEMORAL N/A 11/04/2017   Procedure: TRANSCATHETER AORTIC VALVE REPLACEMENT, TRANSFEMORAL;  Surgeon: [name], MD;  Location: [facility] OR;  Service: Open Heart Surgery;  Laterality: N/A;    Current Medications: Outpatient Medications Prior to Visit  Medication Sig Dispense Refill  . albuterol (PROAIR HFA) 108 (90 BASE) MCG/ACT inhaler Inhale 2 puffs into the lungs every 6 (six) hours as needed for wheezing or shortness of breath.    Marland Kitchen aspirin 81 MG tablet Take 81 mg by mouth daily.    Marland Kitchen atorvastatin (LIPITOR) 40 MG tablet Take 40 mg by mouth daily.    . carvedilol (COREG) 3.125 MG tablet Take 1 tablet (3.125 mg total) by mouth 2 (two) times daily. 60 tablet 3  . clopidogrel (PLAVIX) 75 MG tablet Take 1 tablet (75 mg total) by mouth daily with breakfast. 30 tablet 5  . digoxin (LANOXIN) 0.125 MG tablet Take 1 tablet (0.125 mg total) by mouth daily. 90 tablet 3  . Fluticasone-Salmeterol (ADVAIR) 250-50  MCG/DOSE AEPB Inhale 1 puff into the lungs 2 (two) times daily.    . ivabradine (CORLANOR) 5 MG TABS tablet Take 1 tablet (5 mg total) by mouth 2 (two) times daily with a meal. 60 tablet 3  . losartan (COZAAR) 25 MG tablet Take 1 tablet (25 mg total) by mouth daily. 30 tablet 3  . pantoprazole (PROTONIX) 40 MG tablet Take 1 tablet (40 mg total) by mouth daily. 30 tablet 6  . spironolactone (ALDACTONE) 25 MG tablet Take 1 tablet (25 mg total) by mouth daily. 30 tablet 0  . tamsulosin (FLOMAX) 0.4 MG CAPS capsule Take 0.4 mg by mouth daily.    Marland Kitchen tiotropium (SPIRIVA) 18 MCG inhalation capsule Place 18 mcg into inhaler and inhale daily.     No facility-administered medications prior to visit.      Allergies:   Lisinopril; Amlodipine; Dobutamine; and Metoprolol   Social History   Socioeconomic History  . Marital status: Married    Spouse name: None  . Number of children: None  .  Years of education: None  . Highest education level: None  Social Needs  . Financial resource strain: None  . Food insecurity - worry: None  . Food insecurity - inability: None  . Transportation needs - medical: None  . Transportation needs - non-medical: None  Occupational History  . None  Tobacco Use  . Smoking status: Former Smoker    Years: 0.00    Types: Cigarettes    Last attempt to quit: 1992    Years since quitting: 27.1  . Smokeless tobacco: Former Neurosurgeon    Types: Snuff  . Tobacco comment: quit 25 years ago  Substance and Sexual Activity  . Alcohol use: Yes    Comment: occasional Beer   . Drug use: No  . Sexual activity: None  Other Topics Concern  . None  Social History Narrative  . None     Family History:  The patient's family history includes Heart disease in his father; Heart failure in his father; Hyperlipidemia in his mother; Hypertension in his mother.      ROS:   Please see the history of present illness.    ROS All other systems reviewed and are negative.   PHYSICAL EXAM:    VS:  BP 126/66   Pulse 67   Ht 5\' 10"  (1.778 m)   Wt 204 lb 12.8 oz (92.9 kg)   SpO2 96%   BMI 29.39 kg/m    GEN: Well nourished, well developed, in no acute distress  HEENT: normal  Neck: no JVD, carotid bruits, or masses Cardiac: RRR; very soft flow murmurs. No rubs, or gallops,no edema  Respiratory:  clear to auscultation bilaterally, normal work of breathing GI: soft, nontender, nondistended, + BS MS: no deformity or atrophy  Skin: warm and dry, no rash. Groin sites with mild hematoma but soft  Neuro:  Alert and Oriented x 3, Strength and sensation are intact Psych: euthymic mood, full affect   Wt Readings from Last 3 Encounters:  11/12/17 204 lb 12.8 oz (92.9 kg)  11/05/17 200 lb 9.6 oz (91 kg)  10/31/17 202 lb 6.4 oz (91.8 kg)      Studies/Labs Reviewed:   EKG:  EKG is NOT ordered today.   Recent Labs: 10/31/2017: ALT 14; B Natriuretic Peptide 23.9 11/05/2017: BUN 10; Creatinine, Ser 0.70; Hemoglobin 12.9; Magnesium 1.9; Platelets 90; Potassium 4.0; Sodium 138   Lipid Panel    Component Value Date/Time   CHOL 117 02/28/2014 0442   TRIG 65 02/28/2014 0442   HDL 44 02/28/2014 0442   VLDL 13 02/28/2014 0442   LDLCALC 60 02/28/2014 0442    Additional studies/ records that were reviewed today include:  TAVR OPERATIVE NOTE   Date of Procedure:11/04/2017  Preoperative Diagnosis:Severe Aortic Stenosis   Postoperative Diagnosis:Same   Procedure:   Transcatheter Aortic Valve Replacement - Percutaneous Transfemoral Approach Edwards Sapien 3 THV (size 26mm, model # 9600TFX, serial # [id number])  Co-Surgeons:Bryan Jennefer Bravo, MD and [name], MD  Pre-operative Echo Findings: ? Severe aortic stenosis ? Severeleft ventricular systolic dysfunction  Post-operative Echo Findings: ? Noparavalvular leak ? Unchangedleft ventricular systolic function  _____________  Post  operative echo 11/05/17 Study Conclusions - Left ventricle: The cavity size was mildly dilated. Wall   thickness was normal. Systolic function was moderately to   severely reduced. The estimated ejection fraction was in the   range of 30% to 35%. Features are consistent with a pseudonormal   left ventricular filling pattern, with concomitant abnormal   relaxation  and increased filling pressure (grade 2 diastolic   dysfunction). - Aortic valve: Valve area (VTI): 1.35 cm^2. Valve area (Vmax):   1.28 cm^2. Valve area (Vmean): 1.35 cm^2.    ASSESSMENT & PLAN:   Low gradient, Low Flow Severe AS s/p TAVR: doing excellent one week out. He still has some DOE, but a lot of this related to his COPD. I have encouraged cardiac rehab and he thinks he will try it. Groin sites are stable. He is cleared to drive and resume all normal activities. SBE prophylaxis discussed. He has upcoming dental work, I will call in Amoxil.  He will be seen back for 1 month follow up with an echo on 3/7 with [name].   Chronic systolic CHF: he appears euvolemic. EF 30-35% by POD 1 echo. Continue home CHF regimen of Coreg, Dixogin, Losartan, Spiro and Ivabradine.   HTN: BP well controlled.   VF arrest s/p BiV ICD: followed by [name]   Medication Adjustments/Labs and Tests Ordered: Current medicines are reviewed at length with the patient today.  Concerns regarding medicines are outlined above.  Medication changes, Labs and Tests ordered today are listed in the Patient Instructions below. Patient Instructions  Medication Instructions:  1) TAKE AMOXIL 2g (four 500 mg tablets) 1 hour prior to dental appointments.  Labwork: None  Testing/Procedures: None  Follow-Up: Please keep your follow-up appointments!  Any Other Special Instructions Will Be Listed Below (If Applicable).     If you need a refill on your cardiac medications before your next appointment, please call your pharmacy.       Signed, Cline Crock, PA-C  11/12/2017 2:12 PM    [facility] [address] [place], [place], [place]  [address] Phone: [phone]; Fax: [phone]

## 2017-11-12 ENCOUNTER — Encounter: Payer: Self-pay | Admitting: Physician Assistant

## 2017-11-12 ENCOUNTER — Ambulatory Visit: Payer: Medicare Other | Admitting: Physician Assistant

## 2017-11-12 ENCOUNTER — Other Ambulatory Visit: Payer: Self-pay | Admitting: Physician Assistant

## 2017-11-12 VITALS — BP 126/66 | HR 67 | Ht 70.0 in | Wt 204.8 lb

## 2017-11-12 DIAGNOSIS — Z95810 Presence of automatic (implantable) cardiac defibrillator: Secondary | ICD-10-CM | POA: Diagnosis not present

## 2017-11-12 DIAGNOSIS — I5022 Chronic systolic (congestive) heart failure: Secondary | ICD-10-CM

## 2017-11-12 DIAGNOSIS — Z952 Presence of prosthetic heart valve: Secondary | ICD-10-CM

## 2017-11-12 DIAGNOSIS — I10 Essential (primary) hypertension: Secondary | ICD-10-CM

## 2017-11-12 MED ORDER — AMOXICILLIN 500 MG PO TABS: ORAL_TABLET | ORAL | 6 refills | Status: AC

## 2017-11-12 NOTE — Patient Instructions (Signed)
Medication Instructions:  1) TAKE AMOXIL 2g (four 500 mg tablets) 1 hour prior to dental appointments.  Labwork: None  Testing/Procedures: None  Follow-Up: Please keep your follow-up appointments!  Any Other Special Instructions Will Be Listed Below (If Applicable).     If you need a refill on your cardiac medications before your next appointment, please call your pharmacy.

## 2017-11-27 ENCOUNTER — Encounter: Payer: Self-pay | Admitting: Cardiovascular Disease

## 2017-11-27 ENCOUNTER — Ambulatory Visit (HOSPITAL_COMMUNITY): Payer: Medicare Other

## 2017-11-27 ENCOUNTER — Ambulatory Visit: Payer: Medicare Other | Admitting: Cardiovascular Disease

## 2017-11-27 VITALS — BP 124/76 | HR 75 | Ht 70.0 in | Wt 206.8 lb

## 2017-11-27 DIAGNOSIS — Z952 Presence of prosthetic heart valve: Secondary | ICD-10-CM

## 2017-11-27 NOTE — Progress Notes (Signed)
Cardiology Office Note Date:  11/27/2017   ID:  Dodge Ator, DOB [birth year], MRN [id number]  PCP:  Rory Percy, MD  Cardiologist:  [name], MD    Chief Complaint  Patient presents with  . Follow-up    s/p TAVR     History of Present Illness: Benjamin Frost is a 77 y.o. male who presents for 30 day TAVR follow-up.   The patient underwent TAVR November 04, 2017 for treatment of severe low flow low gradient aortic stenosis using a 26 mm S3 valve delivered via percutaneous transfemoral access.  His procedure was uncomplicated and he was discharged home postoperative day #1.  He is here alone today.  He is doing well and has no new complaints.  He continues to be limited by shortness of breath with exertion.  He states that since undergoing TAVR, there was a chest pressure that has now resolved completely.  He has no shortness of breath at rest.  He denies orthopnea, PND, or leg swelling.  His medications are unchanged.  Past Medical History:  Diagnosis Date  . AICD (automatic cardioverter/defibrillator) present    a. St Jude, placed after VF arrest during a dobuatamine echo.   . Aortic stenosis    a. mod-severe low flow low gradient AS.   Marland Kitchen Aortic stenosis, severe    a. 11/04/17: s/p TAVR by [name] and [name].   . Chronic systolic heart failure (HCC)   . COPD (chronic obstructive pulmonary disease) (HCC)   . GERD (gastroesophageal reflux disease)   . History of BPH   . History of kidney stones   . Hyperlipidemia   . Hypertension   . Left bundle branch block   . S/P TAVR (transcatheter aortic valve replacement)    a. 10/2017: s/p TAVR with an Edwards Sapien 3 THV (size 26 mm, model # 9600TFX, serial # [id number])  . Sinus tachycardia     Past Surgical History:  Procedure Laterality Date  . 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 CARDIAC CATHETERIZATION  2012   [facility]  .  CARDIAC CATHETERIZATION     [facility]  . CATARACT EXTRACTION, BILATERAL    . LEFT AND RIGHT HEART CATHETERIZATION WITH CORONARY ANGIOGRAM N/A 03/23/2014   Procedure: LEFT AND RIGHT HEART CATHETERIZATION WITH CORONARY ANGIOGRAM;  Surgeon: [name], MD;  Location: [facility] CATH LAB;  Service: Cardiovascular;  Laterality: N/A;  . LEG SURGERY Left    surgery d/t injury from nail gun  . RIGHT/LEFT HEART CATH AND CORONARY ANGIOGRAPHY N/A 05/16/2017   Procedure: RIGHT/LEFT HEART CATH AND CORONARY ANGIOGRAPHY;  Surgeon: [name], MD;  Location: [facility] INVASIVE CV LAB;  Service: Cardiovascular;  Laterality: N/A;  . TEE WITHOUT CARDIOVERSION N/A 11/04/2017   Procedure: TRANSESOPHAGEAL ECHOCARDIOGRAM (TEE);  Surgeon: [name], MD;  Location: [facility] OR;  Service: Open Heart Surgery;  Laterality: N/A;  . TRANSCATHETER AORTIC VALVE REPLACEMENT, TRANSFEMORAL N/A 11/04/2017   Procedure: TRANSCATHETER AORTIC VALVE REPLACEMENT, TRANSFEMORAL;  Surgeon: [name], MD;  Location: [facility] OR;  Service: Open Heart Surgery;  Laterality: N/A;    Current Outpatient Medications  Medication Sig Dispense Refill  . albuterol (PROAIR HFA) 108 (90 BASE) MCG/ACT inhaler Inhale 2 puffs into the lungs every 6 (six) hours as needed for wheezing or shortness of breath.    Marland Kitchen amoxicillin (AMOXIL) 500 MG tablet Take 2,000 mg (four 500 mg tablets) one hour prior to  dental appointments. 4 tablet 6  . aspirin 81 MG tablet Take 81 mg by mouth daily.    Marland Kitchen atorvastatin (LIPITOR) 40 MG tablet Take 40 mg by mouth daily.    . carvedilol (COREG) 3.125 MG tablet Take 1 tablet (3.125 mg total) by mouth 2 (two) times daily. 60 tablet 3  . clopidogrel (PLAVIX) 75 MG tablet Take 1 tablet (75 mg total) by mouth daily with breakfast. 30 tablet 5  . digoxin (LANOXIN) 0.125 MG tablet Take 1 tablet (0.125 mg total) by mouth daily. 90 tablet 3  . Fluticasone-Salmeterol (ADVAIR) 250-50 MCG/DOSE AEPB Inhale 1 puff into the lungs 2 (two) times daily.    .  ivabradine (CORLANOR) 5 MG TABS tablet Take 1 tablet (5 mg total) by mouth 2 (two) times daily with a meal. 60 tablet 3  . losartan (COZAAR) 25 MG tablet Take 1 tablet (25 mg total) by mouth daily. 30 tablet 3  . pantoprazole (PROTONIX) 40 MG tablet Take 1 tablet (40 mg total) by mouth daily. 30 tablet 6  . spironolactone (ALDACTONE) 25 MG tablet Take 1 tablet (25 mg total) by mouth daily. 30 tablet 0  . tamsulosin (FLOMAX) 0.4 MG CAPS capsule Take 0.4 mg by mouth daily.    Marland Kitchen tiotropium (SPIRIVA) 18 MCG inhalation capsule Place 18 mcg into inhaler and inhale daily.     No current facility-administered medications for this visit.     Allergies:   Lisinopril; Amlodipine; Dobutamine; and Metoprolol   Social History:  The patient  reports that he quit smoking about 27 years ago. His smoking use included cigarettes. He quit after 0.00 years of use. He has quit using smokeless tobacco. His smokeless tobacco use included snuff. He reports that he drinks alcohol. He reports that he does not use drugs.   Family History:  The patient's family history includes Heart disease in his father; Heart failure in his father; Hyperlipidemia in his mother; Hypertension in his mother.   ROS:  Please see the history of present illness.  All other systems are reviewed and negative.   PHYSICAL EXAM: VS:  BP 124/76   Pulse 75   Ht 5\' 10"  (1.778 m)   Wt 206 lb 12.8 oz (93.8 kg)   BMI 29.67 kg/m  , BMI Body mass index is 29.67 kg/m. GEN: Well nourished, well developed, in no acute distress  HEENT: normal  Neck: no JVD, no masses. Cardiac: RRR with a grade 1/6 systolic ejection murmur at the right upper sternal border, no diastolic murmur                Respiratory:  clear to auscultation bilaterally, normal work of breathing GI: soft, nontender, nondistended, + BS MS: no deformity or atrophy  Ext: no pretibial edema, bilateral groin sites are clear Skin: warm and dry, no rash Neuro:  Strength and sensation  are intact Psych: euthymic mood, full affect  EKG:  EKG is not ordered today.  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  The patient is doing well with New York Heart Association functional class II symptoms of shortness of breath which is likely multifactorial from chronic systolic heart failure and COPD.  He was scheduled for an echocardiogram prior to his office visit but he misunderstood the instructions and missed the appointment.  We will reschedule this for next week.  He appears clinically stable and will follow with [name] as scheduled.  We will see him back in valve clinic for his 1 year visit.  He will have a repeat echocardiogram at that time.  SBE prophylaxis guidelines are reviewed with him today.  Current medicines are reviewed with the patient today.  The patient does not have concerns regarding medicines.  Labs/ tests ordered today include:  No orders of the defined types were placed in this encounter.   Disposition:   FU one year in Valve Clinic  Signed, [name], MD  11/27/2017 2:49 PM    [facility] [address] [place], [place], [place]  [address] Phone: [phone]; Fax: [phone]

## 2017-11-27 NOTE — Patient Instructions (Signed)
Medication Instructions:  Your provider recommends that you continue on your current medications as directed. Please refer to the Current Medication list given to you today.    Labwork: None  Testing/Procedures: Please call us to arrange your echocardiogram!  You will also have an echo in 1 year.  Follow-Up: Your provider wants you to follow-up in: 1 year with Carlean Jews, PA. You will receive a reminder letter in the mail two months in advance. If you don't receive a letter, please call our office to schedule the follow-up appointment.    Any Other Special Instructions Will Be Listed Below (If Applicable).     If you need a refill on your cardiac medications before your next appointment, please call your pharmacy.

## 2017-12-01 ENCOUNTER — Encounter: Payer: Self-pay | Admitting: *Deleted

## 2017-12-01 ENCOUNTER — Encounter: Payer: Medicare Other | Attending: Cardiovascular Disease | Admitting: *Deleted

## 2017-12-01 VITALS — Ht 69.1 in | Wt 202.3 lb

## 2017-12-01 DIAGNOSIS — Z952 Presence of prosthetic heart valve: Secondary | ICD-10-CM | POA: Diagnosis present

## 2017-12-01 NOTE — Progress Notes (Signed)
Cardiac Individual Treatment Plan  Patient Details  Name: Benjamin Frost MRN: [id number] Date of Birth: 1940-10-31 Referring Provider:     Cardiac Rehab from 12/01/2017 in [facility] Cardiac and Pulmonary Rehab  Referring Provider  Benjamin Sacramento MD [Dr. Sherren Mocha (TAVR)]      Initial Encounter Date:    Cardiac Rehab from 12/01/2017 in [facility] Cardiac and Pulmonary Rehab  Date  12/01/17  Referring Provider  Benjamin Sacramento MD [Dr. Sherren Mocha (TAVR)]      Visit Diagnosis: S/P TAVR (transcatheter aortic valve replacement)  Patient's Home Medications on Admission:  Current Outpatient Medications:  .  albuterol (PROAIR HFA) 108 (90 BASE) MCG/ACT inhaler, Inhale 2 puffs into the lungs every 6 (six) hours as needed for wheezing or shortness of breath., Disp: , Rfl:  .  aspirin 81 MG tablet, Take 81 mg by mouth daily., Disp: , Rfl:  .  atorvastatin (LIPITOR) 40 MG tablet, Take 40 mg by mouth daily., Disp: , Rfl:  .  carvedilol (COREG) 3.125 MG tablet, Take 1 tablet (3.125 mg total) by mouth 2 (two) times daily., Disp: 60 tablet, Rfl: 3 .  clopidogrel (PLAVIX) 75 MG tablet, Take 1 tablet (75 mg total) by mouth daily with breakfast., Disp: 30 tablet, Rfl: 5 .  digoxin (LANOXIN) 0.125 MG tablet, Take 1 tablet (0.125 mg total) by mouth daily., Disp: 90 tablet, Rfl: 3 .  Fluticasone-Salmeterol (ADVAIR) 250-50 MCG/DOSE AEPB, Inhale 1 puff into the lungs 2 (two) times daily., Disp: , Rfl:  .  ivabradine (CORLANOR) 5 MG TABS tablet, Take 1 tablet (5 mg total) by mouth 2 (two) times daily with a meal., Disp: 60 tablet, Rfl: 3 .  losartan (COZAAR) 25 MG tablet, Take 1 tablet (25 mg total) by mouth daily., Disp: 30 tablet, Rfl: 3 .  pantoprazole (PROTONIX) 40 MG tablet, Take 1 tablet (40 mg total) by mouth daily., Disp: 30 tablet, Rfl: 6 .  spironolactone (ALDACTONE) 25 MG tablet, Take 1 tablet (25 mg total) by mouth daily., Disp: 30 tablet, Rfl: 0 .  tamsulosin (FLOMAX) 0.4 MG CAPS capsule, Take 0.4  mg by mouth daily., Disp: , Rfl:  .  tiotropium (SPIRIVA) 18 MCG inhalation capsule, Place 18 mcg into inhaler and inhale daily., Disp: , Rfl:  .  amoxicillin (AMOXIL) 500 MG tablet, Take 2,000 mg (four 500 mg tablets) one hour prior to dental appointments., Disp: 4 tablet, Rfl: 6  Past Medical History: Past Medical History:  Diagnosis Date  . AICD (automatic cardioverter/defibrillator) present    a. St Jude, placed after VF arrest during a dobuatamine echo.   . Aortic stenosis    a. mod-severe low flow low gradient AS.   Marland Kitchen Aortic stenosis, severe    a. 11/04/17: s/p TAVR by Dr. Burt Knack and Dr. Cyndia Bent.   . Chronic systolic heart failure ([place])   . COPD (chronic obstructive pulmonary disease) ([place])   . GERD (gastroesophageal reflux disease)   . History of BPH   . History of kidney stones   . Hyperlipidemia   . Hypertension   . Left bundle branch block   . S/P TAVR (transcatheter aortic valve replacement)    a. 10/2017: s/p TAVR with an Edwards Sapien 3 THV (size 26 mm, model # 9600TFX, serial # [id number])  . Sinus tachycardia     Tobacco Use: Social History   Tobacco Use  Smoking Status Former Smoker  . Years: 0.00  . Types: Cigarettes  . Last attempt to quit: 1992  . Years since  quitting: 27.2  Smokeless Tobacco Former Systems developer  . Types: Snuff  Tobacco Comment   quit 25 years ago    Labs: Recent Review Flowsheet Data    Labs for ITP Cardiac and Pulmonary Rehab Latest Ref Rng & Units 05/16/2017 05/16/2017 10/31/2017 11/04/2017 11/04/2017   Cholestrol 0 - 200 mg/dL - - - - -   LDLCALC 0 - 100 mg/dL - - - - -   HDL 40 - 60 mg/dL - - - - -   Trlycerides 0 - 200 mg/dL - - - - -   Hemoglobin A1c 4.8 - 5.6 % - - 5.4 - -   PHART 7.350 - 7.450 7.402 - 7.432 - -   PCO2ART 32.0 - 48.0 mmHg 40.4 - 41.5 - -   HCO3 20.0 - 28.0 mmol/L 25.2 26.3 27.2 - -   TCO2 22 - 32 mmol/L 26 28 - 29 29   O2SAT % 95.0 67.0 95.5 - -       Exercise Target Goals: Date: 12/01/17  Exercise Program  Goal: Individual exercise prescription set using results from initial 6 min walk test and THRR while considering  patient's activity barriers and safety.   Exercise Prescription Goal: Initial exercise prescription builds to 30-45 minutes a day of aerobic activity, 2-3 days per week.  Home exercise guidelines will be given to patient during program as part of exercise prescription that the participant will acknowledge.  Activity Barriers & Risk Stratification: Activity Barriers & Cardiac Risk Stratification - 12/01/17 1254      Activity Barriers & Cardiac Risk Stratification   Activity Barriers  Arthritis;Shortness of Breath    Cardiac Risk Stratification  Moderate       6 Minute Walk: 6 Minute Walk    Row Name 12/01/17 1527         6 Minute Walk   Phase  Initial     Distance  1035 feet     Walk Time  6 minutes     # of Rest Breaks  0     MPH  1.96     METS  2.06     RPE  13     Perceived Dyspnea   3     VO2 Peak  7.2     Symptoms  Yes (comment)     Comments  SOB     Resting HR  73 bpm     Resting BP  144/74     Resting Oxygen Saturation   95 %     Exercise Oxygen Saturation  during 6 min walk  96 %     Max Ex. HR  98 bpm     Max Ex. BP  156/74     2 Minute Post BP  146/72       Interval HR   1 Minute HR  97     2 Minute HR  97     3 Minute HR  96     4 Minute HR  97     5 Minute HR  98     6 Minute HR  96     2 Minute Post HR  75     Interval Heart Rate?  Yes       Interval Oxygen   Interval Oxygen?  Yes     Baseline Oxygen Saturation %  95 %     1 Minute Oxygen Saturation %  92 %     1 Minute Liters of Oxygen  0 L Room Air     2 Minute Oxygen Saturation %  90 %     2 Minute Liters of Oxygen  0 L     3 Minute Oxygen Saturation %  91 %     3 Minute Liters of Oxygen  0 L     4 Minute Oxygen Saturation %  91 %     4 Minute Liters of Oxygen  0 L     5 Minute Oxygen Saturation %  91 %     5 Minute Liters of Oxygen  0 L     6 Minute Oxygen Saturation %  91 %      6 Minute Liters of Oxygen  0 L     2 Minute Post Oxygen Saturation %  96 %     2 Minute Post Liters of Oxygen  0 L        Oxygen Initial Assessment:   Oxygen Re-Evaluation:   Oxygen Discharge (Final Oxygen Re-Evaluation):   Initial Exercise Prescription: Initial Exercise Prescription - 12/01/17 1500      Date of Initial Exercise RX and Referring Provider   Date  12/01/17    Referring Provider  Benjamin Sacramento MD Dr. Sherren Mocha (TAVR)      Treadmill   MPH  1.4    Grade  0.5    Minutes  15    METs  2.17      REL-XR   Level  1    Speed  50    Minutes  15    METs  2      T5 Nustep   Level  1    SPM  80    Minutes  15    METs  2      Prescription Details   Frequency (times per week)  2    Duration  Progress to 30 minutes of continuous aerobic without signs/symptoms of physical distress      Intensity   THRR 40-80% of Max Heartrate  101-128    Ratings of Perceived Exertion  11-13    Perceived Dyspnea  0-4      Progression   Progression  Continue to progress workloads to maintain intensity without signs/symptoms of physical distress.      Resistance Training   Training Prescription  Yes    Weight  3 lbs    Reps  10-15       Perform Capillary Blood Glucose checks as needed.  Exercise Prescription Changes: Exercise Prescription Changes    Row Name 12/01/17 1300             Response to Exercise   Blood Pressure (Admit)  144/74       Blood Pressure (Exercise)  156/74       Blood Pressure (Exit)  146/72       Heart Rate (Admit)  73 bpm       Heart Rate (Exercise)  98 bpm       Heart Rate (Exit)  75 bpm       Oxygen Saturation (Admit)  95 %       Oxygen Saturation (Exercise)  90 %       Rating of Perceived Exertion (Exercise)  13       Perceived Dyspnea (Exercise)  3       Symptoms  SOB       Comments  walk test results          Exercise Comments:  Exercise Goals and Review: Exercise Goals    Row Name 12/01/17 1531              Exercise Goals   Increase Physical Activity  Yes       Intervention  Provide advice, education, support and counseling about physical activity/exercise needs.;Develop an individualized exercise prescription for aerobic and resistive training based on initial evaluation findings, risk stratification, comorbidities and participant's personal goals.       Expected Outcomes  Short Term: Attend rehab on a regular basis to increase amount of physical activity.;Long Term: Add in home exercise to make exercise part of routine and to increase amount of physical activity.;Long Term: Exercising regularly at least 3-5 days a week.       Increase Strength and Stamina  Yes       Intervention  Provide advice, education, support and counseling about physical activity/exercise needs.;Develop an individualized exercise prescription for aerobic and resistive training based on initial evaluation findings, risk stratification, comorbidities and participant's personal goals.       Expected Outcomes  Short Term: Increase workloads from initial exercise prescription for resistance, speed, and METs.;Short Term: Perform resistance training exercises routinely during rehab and add in resistance training at home;Long Term: Improve cardiorespiratory fitness, muscular endurance and strength as measured by increased METs and functional capacity (6MWT)       Able to understand and use rate of perceived exertion (RPE) scale  Yes       Intervention  Provide education and explanation on how to use RPE scale       Expected Outcomes  Short Term: Able to use RPE daily in rehab to express subjective intensity level;Long Term:  Able to use RPE to guide intensity level when exercising independently       Able to understand and use Dyspnea scale  Yes       Intervention  Provide education and explanation on how to use Dyspnea scale       Expected Outcomes  Short Term: Able to use Dyspnea scale daily in rehab to express subjective sense of  shortness of breath during exertion;Long Term: Able to use Dyspnea scale to guide intensity level when exercising independently       Knowledge and understanding of Target Heart Rate Range (THRR)  Yes       Intervention  Provide education and explanation of THRR including how the numbers were predicted and where they are located for reference       Expected Outcomes  Short Term: Able to state/look up THRR;Long Term: Able to use THRR to govern intensity when exercising independently;Short Term: Able to use daily as guideline for intensity in rehab       Able to check pulse independently  Yes       Intervention  Provide education and demonstration on how to check pulse in carotid and radial arteries.;Review the importance of being able to check your own pulse for safety during independent exercise       Expected Outcomes  Short Term: Able to explain why pulse checking is important during independent exercise;Long Term: Able to check pulse independently and accurately       Understanding of Exercise Prescription  Yes       Intervention  Provide education, explanation, and written materials on patient's individual exercise prescription       Expected Outcomes  Short Term: Able to explain program exercise prescription;Long Term: Able to explain home exercise prescription to exercise independently  Exercise Goals Re-Evaluation :   Discharge Exercise Prescription (Final Exercise Prescription Changes): Exercise Prescription Changes - 12/01/17 1300      Response to Exercise   Blood Pressure (Admit)  144/74    Blood Pressure (Exercise)  156/74    Blood Pressure (Exit)  146/72    Heart Rate (Admit)  73 bpm    Heart Rate (Exercise)  98 bpm    Heart Rate (Exit)  75 bpm    Oxygen Saturation (Admit)  95 %    Oxygen Saturation (Exercise)  90 %    Rating of Perceived Exertion (Exercise)  13    Perceived Dyspnea (Exercise)  3    Symptoms  SOB    Comments  walk test results       Nutrition:   Target Goals: Understanding of nutrition guidelines, daily intake of sodium <1528m, cholesterol <2032m calories 30% from fat and 7% or less from saturated fats, daily to have 5 or more servings of fruits and vegetables.  Biometrics: Pre Biometrics - 12/01/17 1532      Pre Biometrics   Height  5' 9.1" (1.755 m)    Weight  202 lb 4.8 oz (91.8 kg)    Waist Circumference  39.5 inches    Hip Circumference  44 inches    Waist to Hip Ratio  0.9 %    BMI (Calculated)  29.79    Single Leg Stand  4.8 seconds        Nutrition Therapy Plan and Nutrition Goals: Nutrition Therapy & Goals - 12/01/17 1249      Intervention Plan   Intervention  Prescribe, educate and counsel regarding individualized specific dietary modifications aiming towards targeted core components such as weight, hypertension, lipid management, diabetes, heart failure and other comorbidities.;Nutrition handout(s) given to patient.    Expected Outcomes  Short Term Goal: Understand basic principles of dietary content, such as calories, fat, sodium, cholesterol and nutrients.;Short Term Goal: A plan has been developed with personal nutrition goals set during dietitian appointment.;Long Term Goal: Adherence to prescribed nutrition plan.       Nutrition Assessments: Nutrition Assessments - 12/01/17 1249      MEDFICTS Scores   Pre Score  49       Nutrition Goals Re-Evaluation:   Nutrition Goals Discharge (Final Nutrition Goals Re-Evaluation):   Psychosocial: Target Goals: Acknowledge presence or absence of significant depression and/or stress, maximize coping skills, provide positive support system. Participant is able to verbalize types and ability to use techniques and skills needed for reducing stress and depression.   Initial Review & Psychosocial Screening: Initial Psych Review & Screening - 12/01/17 1249      Initial Review   Current issues with  Current Stress Concerns    Source of Stress Concerns  Unable to  perform yard/household activities    Comments  [place] to get back to doing more around the farm and other hobbies, such as trapping and hunting. However, his shortness of breath limits that      Family Dynamics   Good Support System?  Yes wife, children      Barriers   Psychosocial barriers to participate in program  There are no identifiable barriers or psychosocial needs.;Psychosocial barriers identified (see note);The patient should benefit from training in stress management and relaxation.      Screening Interventions   Interventions  Encouraged to exercise;Provide feedback about the scores to participant;Program counselor consult;To provide support and resources with identified psychosocial needs    Expected Outcomes  Short Term goal: Utilizing psychosocial counselor, staff and physician to assist with identification of specific Stressors or current issues interfering with healing process. Setting desired goal for each stressor or current issue identified.;Long Term Goal: Stressors or current issues are controlled or eliminated.;Short Term goal: Identification and review with participant of any Quality of Life or Depression concerns found by scoring the questionnaire.;Long Term goal: The participant improves quality of Life and PHQ9 Scores as seen by post scores and/or verbalization of changes       Quality of Life Scores:  Quality of Life - 12/01/17 1049      Quality of Life Scores   Health/Function Pre  24.7 %    Socioeconomic Pre  30 %    Psych/Spiritual Pre  29.14 %    Family Pre  30 %    GLOBAL Pre  27.49 %      Scores of 19 and below usually indicate a poorer quality of life in these areas.  A difference of  2-3 points is a clinically meaningful difference.  A difference of 2-3 points in the total score of the Quality of Life Index has been associated with significant improvement in overall quality of life, self-image, physical symptoms, and general health in studies  assessing change in quality of life.  PHQ-9: Recent Review Flowsheet Data    Depression screen [facility] 2/9 12/01/2017   Decreased Interest 0   Down, Depressed, Hopeless 0   PHQ - 2 Score 0   Altered sleeping 0   Tired, decreased energy 0   Change in appetite 0   Feeling bad or failure about yourself  0   Trouble concentrating 0   Moving slowly or fidgety/restless 0   Suicidal thoughts 0   PHQ-9 Score 0     Interpretation of Total Score  Total Score Depression Severity:  1-4 = Minimal depression, 5-9 = Mild depression, 10-14 = Moderate depression, 15-19 = Moderately severe depression, 20-27 = Severe depression   Psychosocial Evaluation and Intervention:   Psychosocial Re-Evaluation:   Psychosocial Discharge (Final Psychosocial Re-Evaluation):   Vocational Rehabilitation: Provide vocational rehab assistance to qualifying candidates.   Vocational Rehab Evaluation & Intervention: Vocational Rehab - 12/01/17 1252      Initial Vocational Rehab Evaluation & Intervention   Assessment shows need for Vocational Rehabilitation  No       Education: Education Goals: Education classes will be provided on a variety of topics geared toward better understanding of heart health and risk factor modification. Participant will state understanding/return demonstration of topics presented as noted by education test scores.  Learning Barriers/Preferences: Learning Barriers/Preferences - 12/01/17 1251      Learning Barriers/Preferences   Learning Barriers  Hearing;Sight;Exercise Concerns    Learning Preferences  Individual Instruction       Education Topics:  AED/CPR: - Group verbal and written instruction with the use of models to demonstrate the basic use of the AED with the basic ABC's of resuscitation.   General Nutrition Guidelines/Fats and Fiber: -Group instruction provided by verbal, written material, models and posters to present the general guidelines for heart healthy  nutrition. Gives an explanation and review of dietary fats and fiber.   Controlling Sodium/Reading Food Labels: -Group verbal and written material supporting the discussion of sodium use in heart healthy nutrition. Review and explanation with models, verbal and written materials for utilization of the food label.   Exercise Physiology & General Exercise Guidelines: - Group verbal and written instruction with models to review the  exercise physiology of the cardiovascular system and associated critical values. Provides general exercise guidelines with specific guidelines to those with heart or lung disease.    Aerobic Exercise & Resistance Training: - Gives group verbal and written instruction on the various components of exercise. Focuses on aerobic and resistive training programs and the benefits of this training and how to safely progress through these programs..   Flexibility, Balance, Mind/Body Relaxation: Provides group verbal/written instruction on the benefits of flexibility and balance training, including mind/body exercise modes such as yoga, pilates and tai chi.  Demonstration and skill practice provided.   Stress and Anxiety: - Provides group verbal and written instruction about the health risks of elevated stress and causes of high stress.  Discuss the correlation between heart/lung disease and anxiety and treatment options. Review healthy ways to manage with stress and anxiety.   Depression: - Provides group verbal and written instruction on the correlation between heart/lung disease and depressed mood, treatment options, and the stigmas associated with seeking treatment.   Anatomy & Physiology of the Heart: - Group verbal and written instruction and models provide basic cardiac anatomy and physiology, with the coronary electrical and arterial systems. Review of Valvular disease and Heart Failure   Cardiac Procedures: - Group verbal and written instruction to review  commonly prescribed medications for heart disease. Reviews the medication, class of the drug, and side effects. Includes the steps to properly store meds and maintain the prescription regimen. (beta blockers and nitrates)   Cardiac Medications I: - Group verbal and written instruction to review commonly prescribed medications for heart disease. Reviews the medication, class of the drug, and side effects. Includes the steps to properly store meds and maintain the prescription regimen.   Cardiac Medications II: -Group verbal and written instruction to review commonly prescribed medications for heart disease. Reviews the medication, class of the drug, and side effects. (all other drug classes)    Go Sex-Intimacy & Heart Disease, Get SMART - Goal Setting: - Group verbal and written instruction through game format to discuss heart disease and the return to sexual intimacy. Provides group verbal and written material to discuss and apply goal setting through the application of the S.M.A.R.T. Method.   Other Matters of the Heart: - Provides group verbal, written materials and models to describe Stable Angina and Peripheral Artery. Includes description of the disease process and treatment options available to the cardiac patient.   Exercise & Equipment Safety: - Individual verbal instruction and demonstration of equipment use and safety with use of the equipment.   Cardiac Rehab from 12/01/2017 in [facility] Cardiac and Pulmonary Rehab  Date  12/01/17  Educator  [facility]  Instruction Review Code  1- Verbalizes Understanding      Infection Prevention: - Provides verbal and written material to individual with discussion of infection control including proper hand washing and proper equipment cleaning during exercise session.   Cardiac Rehab from 12/01/2017 in [facility] Cardiac and Pulmonary Rehab  Date  12/01/17  Educator  [facility]  Instruction Review Code  1- Verbalizes Understanding      Falls Prevention: -  Provides verbal and written material to individual with discussion of falls prevention and safety.   Cardiac Rehab from 12/01/2017 in [facility] Cardiac and Pulmonary Rehab  Date  12/01/17  Educator  [facility]  Instruction Review Code  1- Verbalizes Understanding      Diabetes: - Individual verbal and written instruction to review signs/symptoms of diabetes, desired ranges of glucose level fasting, after meals  and with exercise. Acknowledge that pre and post exercise glucose checks will be done for 3 sessions at entry of program.   Know Your Numbers and Risk Factors: -Group verbal and written instruction about important numbers in your health.  Discussion of what are risk factors and how they play a role in the disease process.  Review of Cholesterol, Blood Pressure, Diabetes, and BMI and the role they play in your overall health.   Sleep Hygiene: -Provides group verbal and written instruction about how sleep can affect your health.  Define sleep hygiene, discuss sleep cycles and impact of sleep habits. Review good sleep hygiene tips.    Other: -Provides group and verbal instruction on various topics (see comments)   Knowledge Questionnaire Score: Knowledge Questionnaire Score - 12/01/17 1048      Knowledge Questionnaire Score   Pre Score  19/28 correct answers reviewed with Juanda Crumble       Core Components/Risk Factors/Patient Goals at Admission: Personal Goals and Risk Factors at Admission - 12/01/17 1247      Core Components/Risk Factors/Patient Goals on Admission    Weight Management  Yes;Weight Loss    Intervention  Weight Management: Develop a combined nutrition and exercise program designed to reach desired caloric intake, while maintaining appropriate intake of nutrient and fiber, sodium and fats, and appropriate energy expenditure required for the weight goal.;Weight Management: Provide education and appropriate resources to help participant work on and attain dietary goals.;Weight  Management/Obesity: Establish reasonable short term and long term weight goals.    Admit Weight  193 lb (87.5 kg) at home, weighs every morning; 202 lb on CR's scale    Goal Weight: Short Term  188 lb (85.3 kg)    Goal Weight: Long Term  185 lb (83.9 kg)    Expected Outcomes  Short Term: Continue to assess and modify interventions until short term weight is achieved;Long Term: Adherence to nutrition and physical activity/exercise program aimed toward attainment of established weight goal;Weight Loss: Understanding of general recommendations for a balanced deficit meal plan, which promotes 1-2 lb weight loss per week and includes a negative energy balance of [phone] kcal/d;Understanding recommendations for meals to include 15-35% energy as protein, 25-35% energy from fat, 35-60% energy from carbohydrates, less than 240m of dietary cholesterol, 20-35 gm of total fiber daily;Understanding of distribution of calorie intake throughout the day with the consumption of 4-5 meals/snacks    Heart Failure  Yes    Intervention  Provide a combined exercise and nutrition program that is supplemented with education, support and counseling about heart failure. Directed toward relieving symptoms such as shortness of breath, decreased exercise tolerance, and extremity edema.    Expected Outcomes  Improve functional capacity of life;Short term: Attendance in program 2-3 days a week with increased exercise capacity. Reported lower sodium intake. Reported increased fruit and vegetable intake. Reports medication compliance.;Short term: Daily weights obtained and reported for increase. Utilizing diuretic protocols set by physician.;Long term: Adoption of self-care skills and reduction of barriers for early signs and symptoms recognition and intervention leading to self-care maintenance.    Hypertension  Yes    Intervention  Provide education on lifestyle modifcations including regular physical activity/exercise, weight  management, moderate sodium restriction and increased consumption of fresh fruit, vegetables, and low fat dairy, alcohol moderation, and smoking cessation.;Monitor prescription use compliance.    Expected Outcomes  Short Term: Continued assessment and intervention until BP is < 140/9103mHG in hypertensive participants. < 130/8091mG in hypertensive participants with diabetes,  heart failure or chronic kidney disease.;Long Term: Maintenance of blood pressure at goal levels.    Lipids  Yes    Intervention  Provide education and support for participant on nutrition & aerobic/resistive exercise along with prescribed medications to achieve LDL <55m, HDL >486m    Expected Outcomes  Short Term: Participant states understanding of desired cholesterol values and is compliant with medications prescribed. Participant is following exercise prescription and nutrition guidelines.;Long Term: Cholesterol controlled with medications as prescribed, with individualized exercise RX and with personalized nutrition plan. Value goals: LDL < 7043mHDL > 40 mg.    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 However, his shortness of breath limits that    Intervention  Offer individual and/or small group education and counseling on adjustment to heart disease, stress management and health-related lifestyle change. Teach and support self-help strategies.;Refer participants experiencing significant psychosocial distress to appropriate mental health specialists for further evaluation and treatment. When possible, include family members and significant others in education/counseling sessions.    Expected Outcomes  Short Term: Participant demonstrates changes in health-related behavior, relaxation and other stress management skills, ability to obtain effective social support, and compliance with psychotropic medications if prescribed.;Long Term: Emotional wellbeing  is indicated by absence of clinically significant psychosocial distress or social isolation.       Core Components/Risk Factors/Patient Goals Review:    Core Components/Risk Factors/Patient Goals at Discharge (Final Review):    ITP Comments: ITP Comments    Row Name 12/01/17 1241           ITP Comments  Med Review completed. Initial ITP created. [facility]          Comments: Initial ITP

## 2017-12-01 NOTE — Patient Instructions (Signed)
Patient Instructions  Patient Details  Name: Benjamin Frost MRN: [id number] Date of Birth: [birth year] Referring Provider:  [name], MD  Below are your personal goals for exercise, nutrition, and risk factors. Our goal is to help you stay on track towards obtaining and maintaining these goals. We will be discussing your progress on these goals with you throughout the program.  Initial Exercise Prescription: Initial Exercise Prescription - 12/01/17 1500      Date of Initial Exercise RX and Referring Provider   Date  12/01/17    Referring Provider  Lorine Bears MD Dr. [name] (TAVR)      Treadmill   MPH  1.4    Grade  0.5    Minutes  15    METs  2.17      REL-XR   Level  1    Speed  50    Minutes  15    METs  2      T5 Nustep   Level  1    SPM  80    Minutes  15    METs  2      Prescription Details   Frequency (times per week)  2    Duration  Progress to 30 minutes of continuous aerobic without signs/symptoms of physical distress      Intensity   THRR 40-80% of Max Heartrate  101-128    Ratings of Perceived Exertion  11-13    Perceived Dyspnea  0-4      Progression   Progression  Continue to progress workloads to maintain intensity without signs/symptoms of physical distress.      Resistance Training   Training Prescription  Yes    Weight  3 lbs    Reps  10-15       Exercise Goals: Frequency: Be able to perform aerobic exercise two to three times per week in program working toward 2-5 days per week of home exercise.  Intensity: Work with a perceived exertion of 11 (fairly light) - 15 (hard) while following your exercise prescription.  We will make changes to your prescription with you as you progress through the program.   Duration: Be able to do 30 to 45 minutes of continuous aerobic exercise in addition to a 5 minute warm-up and a 5 minute cool-down routine.   Nutrition Goals: Your personal nutrition goals will be established when you do  your nutrition analysis with the dietician.  The following are general nutrition guidelines to follow: Cholesterol < 200mg /day Sodium < 1500mg /day Fiber: Men over 50 yrs - 30 grams per day  Personal Goals: Personal Goals and Risk Factors at Admission - 12/01/17 1247      Core Components/Risk Factors/Patient Goals on Admission    Weight Management  Yes;Weight Loss    Intervention  Weight Management: Develop a combined nutrition and exercise program designed to reach desired caloric intake, while maintaining appropriate intake of nutrient and fiber, sodium and fats, and appropriate energy expenditure required for the weight goal.;Weight Management: Provide education and appropriate resources to help participant work on and attain dietary goals.;Weight Management/Obesity: Establish reasonable short term and long term weight goals.    Admit Weight  193 lb (87.5 kg) at home, weighs every morning; 202 lb on CR's scale    Goal Weight: Short Term  188 lb (85.3 kg)    Goal Weight: Long Term  185 lb (83.9 kg)    Expected Outcomes  Short Term: Continue to assess and modify interventions until  short term weight is achieved;Long Term: Adherence to nutrition and physical activity/exercise program aimed toward attainment of established weight goal;Weight Loss: Understanding of general recommendations for a balanced deficit meal plan, which promotes 1-2 lb weight loss per week and includes a negative energy balance of [phone] kcal/d;Understanding recommendations for meals to include 15-35% energy as protein, 25-35% energy from fat, 35-60% energy from carbohydrates, less than 200mg  of dietary cholesterol, 20-35 gm of total fiber daily;Understanding of distribution of calorie intake throughout the day with the consumption of 4-5 meals/snacks    Heart Failure  Yes    Intervention  Provide a combined exercise and nutrition program that is supplemented with education, support and counseling about heart failure.  Directed toward relieving symptoms such as shortness of breath, decreased exercise tolerance, and extremity edema.    Expected Outcomes  Improve functional capacity of life;Short term: Attendance in program 2-3 days a week with increased exercise capacity. Reported lower sodium intake. Reported increased fruit and vegetable intake. Reports medication compliance.;Short term: Daily weights obtained and reported for increase. Utilizing diuretic protocols set by physician.;Long term: Adoption of self-care skills and reduction of barriers for early signs and symptoms recognition and intervention leading to self-care maintenance.    Hypertension  Yes    Intervention  Provide education on lifestyle modifcations including regular physical activity/exercise, weight management, moderate sodium restriction and increased consumption of fresh fruit, vegetables, and low fat dairy, alcohol moderation, and smoking cessation.;Monitor prescription use compliance.    Expected Outcomes  Short Term: Continued assessment and intervention until BP is < 140/38mm HG in hypertensive participants. < 130/45mm HG in hypertensive participants with diabetes, heart failure or chronic kidney disease.;Long Term: Maintenance of blood pressure at goal levels.    Lipids  Yes    Intervention  Provide education and support for participant on nutrition & aerobic/resistive exercise along with prescribed medications to achieve LDL 70mg , HDL >40mg .    Expected Outcomes  Short Term: Participant states understanding of desired cholesterol values and is compliant with medications prescribed. Participant is following exercise prescription and nutrition guidelines.;Long Term: Cholesterol controlled with medications as prescribed, with individualized exercise RX and with personalized nutrition plan. Value goals: LDL < 70mg , HDL > 40 mg.    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  However, his shortness of breath limits that    Intervention  Offer individual and/or small group education and counseling on adjustment to heart disease, stress management and health-related lifestyle change. Teach and support self-help strategies.;Refer participants experiencing significant psychosocial distress to appropriate mental health specialists for further evaluation and treatment. When possible, include family members and significant others in education/counseling sessions.    Expected Outcomes  Short Term: Participant demonstrates changes in health-related behavior, relaxation and other stress management skills, ability to obtain effective social support, and compliance with psychotropic medications if prescribed.;Long Term: Emotional wellbeing is indicated by absence of clinically significant psychosocial distress or social isolation.       Tobacco Use Initial Evaluation: Social History   Tobacco Use  Smoking Status Former Smoker  . Years: 0.00  . Types: Cigarettes  . Last attempt to quit: 1992  . Years since quitting: 27.2  Smokeless Tobacco Former Neurosurgeon  . Types: Snuff  Tobacco Comment   quit 25 years ago    Exercise Goals and Review: Exercise Goals    Row Name 12/01/17 1531  Exercise Goals   Increase Physical Activity  Yes       Intervention  Provide advice, education, support and counseling about physical activity/exercise needs.;Develop an individualized exercise prescription for aerobic and resistive training based on initial evaluation findings, risk stratification, comorbidities and participant's personal goals.       Expected Outcomes  Short Term: Attend rehab on a regular basis to increase amount of physical activity.;Long Term: Add in home exercise to make exercise part of routine and to increase amount of physical activity.;Long Term: Exercising regularly at least 3-5 days a week.       Increase Strength and Stamina  Yes       Intervention  Provide  advice, education, support and counseling about physical activity/exercise needs.;Develop an individualized exercise prescription for aerobic and resistive training based on initial evaluation findings, risk stratification, comorbidities and participant's personal goals.       Expected Outcomes  Short Term: Increase workloads from initial exercise prescription for resistance, speed, and METs.;Short Term: Perform resistance training exercises routinely during rehab and add in resistance training at home;Long Term: Improve cardiorespiratory fitness, muscular endurance and strength as measured by increased METs and functional capacity ( )       Able to understand and use rate of perceived exertion (RPE) scale  Yes       Intervention  Provide education and explanation on how to use RPE scale       Expected Outcomes  Short Term: Able to use RPE daily in rehab to express subjective intensity level;Long Term:  Able to use RPE to guide intensity level when exercising independently       Able to understand and use Dyspnea scale  Yes       Intervention  Provide education and explanation on how to use Dyspnea scale       Expected Outcomes  Short Term: Able to use Dyspnea scale daily in rehab to express subjective sense of shortness of breath during exertion;Long Term: Able to use Dyspnea scale to guide intensity level when exercising independently       Knowledge and understanding of Target Heart Rate Range (THRR)  Yes       Intervention  Provide education and explanation of THRR including how the numbers were predicted and where they are located for reference       Expected Outcomes  Short Term: Able to state/look up THRR;Long Term: Able to use THRR to govern intensity when exercising independently;Short Term: Able to use daily as guideline for intensity in rehab       Able to check pulse independently  Yes       Intervention  Provide education and demonstration on how to check pulse in carotid and radial  arteries.;Review the importance of being able to check your own pulse for safety during independent exercise       Expected Outcomes  Short Term: Able to explain why pulse checking is important during independent exercise;Long Term: Able to check pulse independently and accurately       Understanding of Exercise Prescription  Yes       Intervention  Provide education, explanation, and written materials on patient's individual exercise prescription       Expected Outcomes  Short Term: Able to explain program exercise prescription;Long Term: Able to explain home exercise prescription to exercise independently          Copy of goals given to participant.

## 2017-12-04 ENCOUNTER — Ambulatory Visit (HOSPITAL_COMMUNITY): Payer: Medicare Other | Attending: Cardiovascular Disease

## 2017-12-04 ENCOUNTER — Other Ambulatory Visit: Payer: Self-pay

## 2017-12-04 DIAGNOSIS — Z952 Presence of prosthetic heart valve: Secondary | ICD-10-CM

## 2017-12-04 DIAGNOSIS — I10 Essential (primary) hypertension: Secondary | ICD-10-CM | POA: Diagnosis not present

## 2017-12-04 DIAGNOSIS — I4901 Ventricular fibrillation: Secondary | ICD-10-CM | POA: Diagnosis not present

## 2017-12-04 DIAGNOSIS — E785 Hyperlipidemia, unspecified: Secondary | ICD-10-CM | POA: Diagnosis not present

## 2017-12-04 DIAGNOSIS — I447 Left bundle-branch block, unspecified: Secondary | ICD-10-CM | POA: Diagnosis not present

## 2017-12-04 DIAGNOSIS — J449 Chronic obstructive pulmonary disease, unspecified: Secondary | ICD-10-CM | POA: Diagnosis not present

## 2017-12-04 DIAGNOSIS — I350 Nonrheumatic aortic (valve) stenosis: Secondary | ICD-10-CM | POA: Insufficient documentation

## 2017-12-09 ENCOUNTER — Encounter: Payer: Self-pay | Admitting: Cardiovascular Disease

## 2017-12-09 ENCOUNTER — Ambulatory Visit: Payer: Medicare Other | Admitting: Cardiovascular Disease

## 2017-12-09 VITALS — BP 104/60 | HR 76 | Ht 70.0 in | Wt 203.0 lb

## 2017-12-09 DIAGNOSIS — I5022 Chronic systolic (congestive) heart failure: Secondary | ICD-10-CM | POA: Diagnosis not present

## 2017-12-09 DIAGNOSIS — Z952 Presence of prosthetic heart valve: Secondary | ICD-10-CM | POA: Diagnosis not present

## 2017-12-09 DIAGNOSIS — E785 Hyperlipidemia, unspecified: Secondary | ICD-10-CM

## 2017-12-09 DIAGNOSIS — Z95810 Presence of automatic (implantable) cardiac defibrillator: Secondary | ICD-10-CM

## 2017-12-09 NOTE — Patient Instructions (Signed)
Medication Instructions:  Your physician has recommended you make the following change in your medication:  STOP taking digoxin   Labwork: none  Testing/Procedures: none  Follow-Up: Your physician recommends that you schedule a follow-up appointment in: 3 months with [name].     Any Other Special Instructions Will Be Listed Below (If Applicable).     If you need a refill on your cardiac medications before your next appointment, please call your pharmacy.

## 2017-12-09 NOTE — Progress Notes (Signed)
Cardiology Office Note   Date:  12/09/2017   ID:  Benjamin Frost, DOB [birth year], MRN [id number]  PCP:  Rory Percy, MD  Cardiologist:   Lorine Bears, MD   Chief Complaint  Patient presents with  . other     s/p Transcatheter aortic valve replacement. Meds reviewed verbally with pt.      History of Present Illness: Benjamin Frost is a 77 y.o. male who presents for a followup visit regarding chronic systolic heart failure with severely reduced LV systolic function and an underlying left bundle branch block. Previous ejection fraction was 10-15% in 2015. Right and  left cardiac catheterization in July, 2015 showed only mildly elevated filling pressures with normal cardiac output and no significant pulmonary hypertension. Coronary angiography showed no obstructive coronary artery disease. He had significant sinus tachycardia that improved significantly with Ivabradine.  The patient had worsening aortic stenosis.  He was suspected of having low gradient severe aortic stenosis.  A dobutamine echocardiogram was attempted but it was complicated by ventricular fibrillation arrest.  He had a biventricular ICD placed by [name] in October.  Follow-up echocardiogram showed improvement in ejection fraction and findings consistent with severe aortic stenosis. He underwent TAVR by [name] last month.  The patient feels significantly better with improvement in shortness of breath.  He had an echocardiogram done which showed improvement in ejection fraction to 45-50% with normal functioning aortic valve.   Past Medical History:  Diagnosis Date  . AICD (automatic cardioverter/defibrillator) present    a. St Jude, placed after VF arrest during a dobuatamine echo.   . Aortic stenosis    a. mod-severe low flow low gradient AS.   Marland Kitchen Aortic stenosis, severe    a. 11/04/17: s/p TAVR by [name] and [name].   . Chronic systolic heart failure (HCC)   . COPD (chronic obstructive pulmonary  disease) (HCC)   . GERD (gastroesophageal reflux disease)   . History of BPH   . History of kidney stones   . Hyperlipidemia   . Hypertension   . Left bundle branch block   . S/P TAVR (transcatheter aortic valve replacement)    a. 10/2017: s/p TAVR with an Edwards Sapien 3 THV (size 26 mm, model # 9600TFX, serial # [id number])  . Sinus tachycardia     Past Surgical History:  Procedure Laterality Date  . 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 CARDIAC CATHETERIZATION  2012   [facility]  . CARDIAC CATHETERIZATION     [facility]  . CATARACT EXTRACTION, BILATERAL    . LEFT AND RIGHT HEART CATHETERIZATION WITH CORONARY ANGIOGRAM N/A 03/23/2014   Procedure: LEFT AND RIGHT HEART CATHETERIZATION WITH CORONARY ANGIOGRAM;  Surgeon: [name], MD;  Location: [facility] CATH LAB;  Service: Cardiovascular;  Laterality: N/A;  . LEG SURGERY Left    surgery d/t injury from nail gun  . RIGHT/LEFT HEART CATH AND CORONARY ANGIOGRAPHY N/A 05/16/2017   Procedure: RIGHT/LEFT HEART CATH AND CORONARY ANGIOGRAPHY;  Surgeon: [name], MD;  Location: [facility] INVASIVE CV LAB;  Service: Cardiovascular;  Laterality: N/A;  . TEE WITHOUT CARDIOVERSION N/A 11/04/2017   Procedure: TRANSESOPHAGEAL ECHOCARDIOGRAM (TEE);  Surgeon: [name], MD;  Location[facility] OR;  Service: Open Heart Surgery;  Laterality: N/A;  . TRANSCATHETER AORTIC VALVE REPLACEMENT, TRANSFEMORAL N/A 11/04/2017   Procedure: TRANSCATHETER AORTIC VALVE REPLACEMENT, TRANSFEMORAL;  Surgeon: [name], MD;  Location[facility]  OR;  Service: Open Heart Surgery;  Laterality: N/A;     Current Outpatient Medications  Medication Sig Dispense Refill  . albuterol (PROAIR HFA) 108 (90 BASE) MCG/ACT inhaler Inhale 2 puffs into the lungs every 6 (six) hours as needed for wheezing or shortness of breath.    Marland Kitchen amoxicillin (AMOXIL) 500 MG tablet Take 2,000 mg (four 500 mg tablets) one hour prior to  dental appointments. 4 tablet 6  . aspirin 81 MG tablet Take 81 mg by mouth daily.    Marland Kitchen atorvastatin (LIPITOR) 40 MG tablet Take 40 mg by mouth daily.    . carvedilol (COREG) 3.125 MG tablet Take 1 tablet (3.125 mg total) by mouth 2 (two) times daily. 60 tablet 3  . clopidogrel (PLAVIX) 75 MG tablet Take 1 tablet (75 mg total) by mouth daily with breakfast. 30 tablet 5  . digoxin (LANOXIN) 0.125 MG tablet Take 1 tablet (0.125 mg total) by mouth daily. 90 tablet 3  . Fluticasone-Salmeterol (ADVAIR) 250-50 MCG/DOSE AEPB Inhale 1 puff into the lungs 2 (two) times daily.    . ivabradine (CORLANOR) 5 MG TABS tablet Take 1 tablet (5 mg total) by mouth 2 (two) times daily with a meal. 60 tablet 3  . losartan (COZAAR) 25 MG tablet Take 1 tablet (25 mg total) by mouth daily. 30 tablet 3  . pantoprazole (PROTONIX) 40 MG tablet Take 1 tablet (40 mg total) by mouth daily. 30 tablet 6  . spironolactone (ALDACTONE) 25 MG tablet Take 1 tablet (25 mg total) by mouth daily. 30 tablet 0  . tamsulosin (FLOMAX) 0.4 MG CAPS capsule Take 0.4 mg by mouth daily.    Marland Kitchen tiotropium (SPIRIVA) 18 MCG inhalation capsule Place 18 mcg into inhaler and inhale daily.     No current facility-administered medications for this visit.     Allergies:   Lisinopril; Amlodipine; Dobutamine; and Metoprolol    Social History:  The patient  reports that he quit smoking about 27 years ago. His smoking use included cigarettes. He quit after 0.00 years of use. He has quit using smokeless tobacco. His smokeless tobacco use included snuff. He reports that he drinks alcohol. He reports that he does not use drugs.   Family History:  The patient's family history includes Heart disease in his father; Heart failure in his father; Hyperlipidemia in his mother; Hypertension in his mother.    ROS:  Please see the history of present illness.   Otherwise, review of systems are positive for none.   All other systems are reviewed and negative.     PHYSICAL EXAM: VS:  BP 104/60 (BP Location: Left Arm, Patient Position: Sitting, Cuff Size: Normal)   Pulse 76   Ht 5\' 10"  (1.778 m)   Wt 203 lb (92.1 kg)   BMI 29.13 kg/m  , BMI Body mass index is 29.13 kg/m. GEN: Well nourished, well developed, in no acute distress  HEENT: normal  Neck: no JVD, carotid bruits, or masses Cardiac: RRR; no  rubs, or gallops,no edema . There is 1/6 systolic murmur in the aortic area . Respiratory: Few crackles at the bases , normal work of breathing GI: soft, nontender, nondistended, + BS MS: no deformity or atrophy  Skin: warm and dry, no rash Neuro:  Strength and sensation are intact Psych: euthymic mood, full affect   EKG:  EKG is ordered today. The ekg ordered today demonstrates atrial sensed ventricular paced rhythm with a PVC.   Recent Labs: 10/31/2017: ALT 14; B Natriuretic  Peptide 23.9 11/05/2017: BUN 10; Creatinine, Ser 0.70; Hemoglobin 12.9; Magnesium 1.9; Platelets 90; Potassium 4.0; Sodium 138    Lipid Panel    Component Value Date/Time   CHOL 117 02/28/2014 0442   TRIG 65 02/28/2014 0442   HDL 44 02/28/2014 0442   VLDL 13 02/28/2014 0442   LDLCALC 60 02/28/2014 0442      Wt Readings from Last 3 Encounters:  12/09/17 203 lb (92.1 kg)  12/01/17 202 lb 4.8 oz (91.8 kg)  11/27/17 206 lb 12.8 oz (93.8 kg)       ASSESSMENT AND PLAN:  1.  Chronic systolic heart failure: Due to nonischemic cardiomyopathy.  He is currently New [place] Heart Association class II.  Most recent ejection fraction was 45-50%.  I elected to discontinue digoxin.  Continue other heart failure medications.  We can consider stopping ivabradine if EF continues to improve.  2.  Status post TAVR  for severe aortic stenosis:  Significant improvement in symptoms.  Most recent echo showed normal functioning prosthesis.  3. Hyperlipidemia: Currently on atorvastatin.  4.  Status post ICD CRT: Followed by the EP clinic.  EKG today shows functioning  pacemaker.   Disposition:   FU with me in 3 months  Signed,  Lorine Bears, MD  12/09/2017 1:56 PM    [facility]

## 2017-12-10 ENCOUNTER — Ambulatory Visit (INDEPENDENT_AMBULATORY_CARE_PROVIDER_SITE_OTHER): Payer: Medicare Other | Admitting: *Deleted

## 2017-12-10 DIAGNOSIS — I5022 Chronic systolic (congestive) heart failure: Secondary | ICD-10-CM | POA: Diagnosis not present

## 2017-12-10 DIAGNOSIS — I428 Other cardiomyopathies: Secondary | ICD-10-CM

## 2017-12-10 NOTE — Progress Notes (Signed)
Remote ICD transmission.   

## 2017-12-11 ENCOUNTER — Encounter: Payer: Self-pay | Admitting: Thoracic Surgery (Cardiothoracic Vascular Surgery)

## 2017-12-11 ENCOUNTER — Encounter: Payer: Self-pay | Admitting: Cardiology

## 2017-12-11 DIAGNOSIS — Z952 Presence of prosthetic heart valve: Secondary | ICD-10-CM

## 2017-12-11 NOTE — Progress Notes (Signed)
Daily Session Note  Patient Details  Name: Benjamin Frost MRN: [id number] Date of Birth: 1940/10/23 Referring Provider:     Cardiac Rehab from 12/01/2017 in [facility] Cardiac and Pulmonary Rehab  Referring Provider  Kathlyn Sacramento MD [Dr. Sherren Mocha (TAVR)]      Encounter Date: 12/11/2017  Check In: Session Check In - 12/11/17 0817      Check-In   Location  [facility]-Cardiac & Pulmonary Rehab    Staff Present  Alberteen Sam, MA, RCEP, CCRP, Exercise Physiologist;Derryl Uher Oletta Darter, BA, ACSM CEP, Exercise Physiologist;Carroll Enterkin, RN, BSN    Supervising physician immediately available to respond to emergencies  See telemetry face sheet for immediately available ER MD    Medication changes reported      No    Fall or balance concerns reported     No    Warm-up and Cool-down  Performed on first and last piece of equipment    Resistance Training Performed  Yes    VAD Patient?  No      Pain Assessment   Currently in Pain?  No/denies    Multiple Pain Sites  No          Social History   Tobacco Use  Smoking Status Former Smoker  . Years: 0.00  . Types: Cigarettes  . Last attempt to quit: 1992  . Years since quitting: 27.2  Smokeless Tobacco Former Systems developer  . Types: Snuff  Tobacco Comment   quit 25 years ago    Goals Met:  Independence with exercise equipment Exercise tolerated well No report of cardiac concerns or symptoms Strength training completed today  Goals Unmet:  Not Applicable  Comments: First full day of exercise!  Patient was oriented to gym and equipment including functions, settings, policies, and procedures.  Patient's individual exercise prescription and treatment plan were reviewed.  All starting workloads were established based on the results of the 6 minute walk test done at initial orientation visit.  The plan for exercise progression was also introduced and progression will be customized based on patient's performance and goals.    Dr. Emily Filbert is  Medical Director for [facility].

## 2017-12-12 LAB — CUP PACEART REMOTE DEVICE CHECK
Brady Statistic AP VP Percent: 9.4 %
Brady Statistic AP VS Percent: 1 %
Brady Statistic AS VP Percent: 87 %
Brady Statistic AS VS Percent: 1.9 %
HighPow Impedance: 81 Ohm
HighPow Impedance: 81 Ohm
Implantable Lead Implant Date: 20180906
Implantable Lead Implant Date: 20180906
Implantable Lead Location: 753859
Implantable Lead Location: 753860
Implantable Pulse Generator Implant Date: 20180906
Lead Channel Impedance Value: 1225 Ohm
Lead Channel Impedance Value: 540 Ohm
Lead Channel Pacing Threshold Amplitude: 0.75 V
Lead Channel Pacing Threshold Pulse Width: 0.5 ms
Lead Channel Pacing Threshold Pulse Width: 0.5 ms
Lead Channel Sensing Intrinsic Amplitude: 12 mV
Lead Channel Sensing Intrinsic Amplitude: 4.2 mV
Lead Channel Setting Pacing Amplitude: 2 V
Lead Channel Setting Pacing Amplitude: 2.5 V
Lead Channel Setting Pacing Amplitude: 2.5 V
Lead Channel Setting Pacing Pulse Width: 0.5 ms
Lead Channel Setting Pacing Pulse Width: 0.5 ms
MDC IDC LEAD IMPLANT DT: 20180906
MDC IDC LEAD LOCATION: 753858
MDC IDC MSMT BATTERY REMAINING LONGEVITY: 76 mo
MDC IDC MSMT BATTERY REMAINING PERCENTAGE: 86 %
MDC IDC MSMT BATTERY VOLTAGE: 3.02 V
MDC IDC MSMT LEADCHNL LV PACING THRESHOLD AMPLITUDE: 1.5 V
MDC IDC MSMT LEADCHNL LV PACING THRESHOLD PULSEWIDTH: 0.5 ms
MDC IDC MSMT LEADCHNL RV IMPEDANCE VALUE: 490 Ohm
MDC IDC MSMT LEADCHNL RV PACING THRESHOLD AMPLITUDE: 0.5 V
MDC IDC SESS DTM: 20190320072519
MDC IDC SET LEADCHNL RV SENSING SENSITIVITY: 0.5 mV
MDC IDC STAT BRADY RA PERCENT PACED: 7.5 %
Pulse Gen Serial Number: 7421078

## 2017-12-16 DIAGNOSIS — Z952 Presence of prosthetic heart valve: Secondary | ICD-10-CM | POA: Diagnosis not present

## 2017-12-16 NOTE — Progress Notes (Signed)
Daily Session Note  Patient Details  Name: Benjamin Frost MRN: [id number] Date of Birth: Aug 14, 1941 Referring Provider:     Cardiac Rehab from 12/01/2017 in [facility] Cardiac and Pulmonary Rehab  Referring Provider  Kathlyn Sacramento MD [Dr. Sherren Mocha (TAVR)]      Encounter Date: 12/16/2017  Check In: Session Check In - 12/16/17 0829      Check-In   Location  [facility]-Cardiac & Pulmonary Rehab    Staff Present  Nada Maclachlan, BA, ACSM CEP, Exercise Physiologist;Susanne Bice, RN, BSN, CCRP;Joseph Flavia Shipper    Supervising physician immediately available to respond to emergencies  See telemetry face sheet for immediately available ER MD    Medication changes reported      No    Fall or balance concerns reported     No    Warm-up and Cool-down  Performed on first and last piece of equipment    Resistance Training Performed  Yes    VAD Patient?  No      Pain Assessment   Currently in Pain?  No/denies    Multiple Pain Sites  No          Social History   Tobacco Use  Smoking Status Former Smoker  . Years: 0.00  . Types: Cigarettes  . Last attempt to quit: 1992  . Years since quitting: 27.2  Smokeless Tobacco Former Systems developer  . Types: Snuff  Tobacco Comment   quit 25 years ago    Goals Met:  Independence with exercise equipment Exercise tolerated well No report of cardiac concerns or symptoms Strength training completed today  Goals Unmet:  Not Applicable  Comments: Pt able to follow exercise prescription today without complaint.  Will continue to monitor for progression.    Dr. Emily Filbert is Medical Director for [place] [facility].

## 2017-12-17 ENCOUNTER — Encounter: Payer: Self-pay | Admitting: *Deleted

## 2017-12-17 DIAGNOSIS — Z952 Presence of prosthetic heart valve: Secondary | ICD-10-CM

## 2017-12-17 NOTE — Progress Notes (Signed)
Cardiac Individual Treatment Plan  Patient Details  Name: Benjamin Frost MRN: [id number] Date of Birth: 05-16-1941 Referring Provider:     Cardiac Rehab from 12/01/2017 in [facility] Cardiac and Pulmonary Rehab  Referring Provider  Kathlyn Sacramento MD [Dr. Sherren Mocha (TAVR)]      Initial Encounter Date:    Cardiac Rehab from 12/01/2017 in [facility] Cardiac and Pulmonary Rehab  Date  12/01/17  Referring Provider  Kathlyn Sacramento MD [Dr. Sherren Mocha (TAVR)]      Visit Diagnosis: S/P TAVR (transcatheter aortic valve replacement)  Patient's Home Medications on Admission:  Current Outpatient Medications:  .  albuterol (PROAIR HFA) 108 (90 BASE) MCG/ACT inhaler, Inhale 2 puffs into the lungs every 6 (six) hours as needed for wheezing or shortness of breath., Disp: , Rfl:  .  amoxicillin (AMOXIL) 500 MG tablet, Take 2,000 mg (four 500 mg tablets) one hour prior to dental appointments., Disp: 4 tablet, Rfl: 6 .  aspirin 81 MG tablet, Take 81 mg by mouth daily., Disp: , Rfl:  .  atorvastatin (LIPITOR) 40 MG tablet, Take 40 mg by mouth daily., Disp: , Rfl:  .  carvedilol (COREG) 3.125 MG tablet, Take 1 tablet (3.125 mg total) by mouth 2 (two) times daily., Disp: 60 tablet, Rfl: 3 .  clopidogrel (PLAVIX) 75 MG tablet, Take 1 tablet (75 mg total) by mouth daily with breakfast., Disp: 30 tablet, Rfl: 5 .  Fluticasone-Salmeterol (ADVAIR) 250-50 MCG/DOSE AEPB, Inhale 1 puff into the lungs 2 (two) times daily., Disp: , Rfl:  .  ivabradine (CORLANOR) 5 MG TABS tablet, Take 1 tablet (5 mg total) by mouth 2 (two) times daily with a meal., Disp: 60 tablet, Rfl: 3 .  losartan (COZAAR) 25 MG tablet, Take 1 tablet (25 mg total) by mouth daily., Disp: 30 tablet, Rfl: 3 .  pantoprazole (PROTONIX) 40 MG tablet, Take 1 tablet (40 mg total) by mouth daily., Disp: 30 tablet, Rfl: 6 .  spironolactone (ALDACTONE) 25 MG tablet, Take 1 tablet (25 mg total) by mouth daily., Disp: 30 tablet, Rfl: 0 .  tamsulosin (FLOMAX)  0.4 MG CAPS capsule, Take 0.4 mg by mouth daily., Disp: , Rfl:  .  tiotropium (SPIRIVA) 18 MCG inhalation capsule, Place 18 mcg into inhaler and inhale daily., Disp: , Rfl:   Past Medical History: Past Medical History:  Diagnosis Date  . AICD (automatic cardioverter/defibrillator) present    a. St Jude, placed after VF arrest during a dobuatamine echo.   . Aortic stenosis    a. mod-severe low flow low gradient AS.   Marland Kitchen Aortic stenosis, severe    a. 11/04/17: s/p TAVR by Dr. Burt Knack and Dr. Cyndia Bent.   . Chronic systolic heart failure ([place])   . COPD (chronic obstructive pulmonary disease) ([place])   . GERD (gastroesophageal reflux disease)   . History of BPH   . History of kidney stones   . Hyperlipidemia   . Hypertension   . Left bundle branch block   . S/P TAVR (transcatheter aortic valve replacement)    a. 10/2017: s/p TAVR with an Edwards Sapien 3 THV (size 26 mm, model # 9600TFX, serial # [id number])  . Sinus tachycardia     Tobacco Use: Social History   Tobacco Use  Smoking Status Former Smoker  . Years: 0.00  . Types: Cigarettes  . Last attempt to quit: 1992  . Years since quitting: 27.2  Smokeless Tobacco Former Systems developer  . Types: Snuff  Tobacco Comment   quit 25 years ago  Labs: Recent Review Flowsheet Data    Labs for ITP Cardiac and Pulmonary Rehab Latest Ref Rng & Units 05/16/2017 05/16/2017 10/31/2017 11/04/2017 11/04/2017   Cholestrol 0 - 200 mg/dL - - - - -   LDLCALC 0 - 100 mg/dL - - - - -   HDL 40 - 60 mg/dL - - - - -   Trlycerides 0 - 200 mg/dL - - - - -   Hemoglobin A1c 4.8 - 5.6 % - - 5.4 - -   PHART 7.350 - 7.450 7.402 - 7.432 - -   PCO2ART 32.0 - 48.0 mmHg 40.4 - 41.5 - -   HCO3 20.0 - 28.0 mmol/L 25.2 26.3 27.2 - -   TCO2 22 - 32 mmol/L 26 28 - 29 29   O2SAT % 95.0 67.0 95.5 - -       Exercise Target Goals:    Exercise Program Goal: Individual exercise prescription set using results from initial 6 min walk test and THRR while considering  patient's  activity barriers and safety.   Exercise Prescription Goal: Initial exercise prescription builds to 30-45 minutes a day of aerobic activity, 2-3 days per week.  Home exercise guidelines will be given to patient during program as part of exercise prescription that the participant will acknowledge.  Activity Barriers & Risk Stratification: Activity Barriers & Cardiac Risk Stratification - 12/01/17 1254      Activity Barriers & Cardiac Risk Stratification   Activity Barriers  Arthritis;Shortness of Breath    Cardiac Risk Stratification  Moderate       6 Minute Walk: 6 Minute Walk    Row Name 12/01/17 1527         6 Minute Walk   Phase  Initial     Distance  1035 feet     Walk Time  6 minutes     # of Rest Breaks  0     MPH  1.96     METS  2.06     RPE  13     Perceived Dyspnea   3     VO2 Peak  7.2     Symptoms  Yes (comment)     Comments  SOB     Resting HR  73 bpm     Resting BP  144/74     Resting Oxygen Saturation   95 %     Exercise Oxygen Saturation  during 6 min walk  96 %     Max Ex. HR  98 bpm     Max Ex. BP  156/74     2 Minute Post BP  146/72       Interval HR   1 Minute HR  97     2 Minute HR  97     3 Minute HR  96     4 Minute HR  97     5 Minute HR  98     6 Minute HR  96     2 Minute Post HR  75     Interval Heart Rate?  Yes       Interval Oxygen   Interval Oxygen?  Yes     Baseline Oxygen Saturation %  95 %     1 Minute Oxygen Saturation %  92 %     1 Minute Liters of Oxygen  0 L Room Air     2 Minute Oxygen Saturation %  90 %     2 Minute Liters  of Oxygen  0 L     3 Minute Oxygen Saturation %  91 %     3 Minute Liters of Oxygen  0 L     4 Minute Oxygen Saturation %  91 %     4 Minute Liters of Oxygen  0 L     5 Minute Oxygen Saturation %  91 %     5 Minute Liters of Oxygen  0 L     6 Minute Oxygen Saturation %  91 %     6 Minute Liters of Oxygen  0 L     2 Minute Post Oxygen Saturation %  96 %     2 Minute Post Liters of Oxygen  0 L         Oxygen Initial Assessment:   Oxygen Re-Evaluation:   Oxygen Discharge (Final Oxygen Re-Evaluation):   Initial Exercise Prescription: Initial Exercise Prescription - 12/01/17 1500      Date of Initial Exercise RX and Referring Provider   Date  12/01/17    Referring Provider  Kathlyn Sacramento MD Dr. Sherren Mocha (TAVR)      Treadmill   MPH  1.4    Grade  0.5    Minutes  15    METs  2.17      REL-XR   Level  1    Speed  50    Minutes  15    METs  2      T5 Nustep   Level  1    SPM  80    Minutes  15    METs  2      Prescription Details   Frequency (times per week)  2    Duration  Progress to 30 minutes of continuous aerobic without signs/symptoms of physical distress      Intensity   THRR 40-80% of Max Heartrate  101-128    Ratings of Perceived Exertion  11-13    Perceived Dyspnea  0-4      Progression   Progression  Continue to progress workloads to maintain intensity without signs/symptoms of physical distress.      Resistance Training   Training Prescription  Yes    Weight  3 lbs    Reps  10-15       Perform Capillary Blood Glucose checks as needed.  Exercise Prescription Changes: Exercise Prescription Changes    Row Name 12/01/17 1300             Response to Exercise   Blood Pressure (Admit)  144/74       Blood Pressure (Exercise)  156/74       Blood Pressure (Exit)  146/72       Heart Rate (Admit)  73 bpm       Heart Rate (Exercise)  98 bpm       Heart Rate (Exit)  75 bpm       Oxygen Saturation (Admit)  95 %       Oxygen Saturation (Exercise)  90 %       Rating of Perceived Exertion (Exercise)  13       Perceived Dyspnea (Exercise)  3       Symptoms  SOB       Comments  walk test results          Exercise Comments: Exercise Comments    Row Name 12/11/17 0818           Exercise Comments  First full  day of exercise!  Patient was oriented to gym and equipment including functions, settings, policies, and procedures.   Patient's individual exercise prescription and treatment plan were reviewed.  All starting workloads were established based on the results of the 6 minute walk test done at initial orientation visit.  The plan for exercise progression was also introduced and progression will be customized based on patient's performance and goals.          Exercise Goals and Review: Exercise Goals    Row Name 12/01/17 1531             Exercise Goals   Increase Physical Activity  Yes       Intervention  Provide advice, education, support and counseling about physical activity/exercise needs.;Develop an individualized exercise prescription for aerobic and resistive training based on initial evaluation findings, risk stratification, comorbidities and participant's personal goals.       Expected Outcomes  Short Term: Attend rehab on a regular basis to increase amount of physical activity.;Long Term: Add in home exercise to make exercise part of routine and to increase amount of physical activity.;Long Term: Exercising regularly at least 3-5 days a week.       Increase Strength and Stamina  Yes       Intervention  Provide advice, education, support and counseling about physical activity/exercise needs.;Develop an individualized exercise prescription for aerobic and resistive training based on initial evaluation findings, risk stratification, comorbidities and participant's personal goals.       Expected Outcomes  Short Term: Increase workloads from initial exercise prescription for resistance, speed, and METs.;Short Term: Perform resistance training exercises routinely during rehab and add in resistance training at home;Long Term: Improve cardiorespiratory fitness, muscular endurance and strength as measured by increased METs and functional capacity (6MWT)       Able to understand and use rate of perceived exertion (RPE) scale  Yes       Intervention  Provide education and explanation on how to use RPE scale        Expected Outcomes  Short Term: Able to use RPE daily in rehab to express subjective intensity level;Long Term:  Able to use RPE to guide intensity level when exercising independently       Able to understand and use Dyspnea scale  Yes       Intervention  Provide education and explanation on how to use Dyspnea scale       Expected Outcomes  Short Term: Able to use Dyspnea scale daily in rehab to express subjective sense of shortness of breath during exertion;Long Term: Able to use Dyspnea scale to guide intensity level when exercising independently       Knowledge and understanding of Target Heart Rate Range (THRR)  Yes       Intervention  Provide education and explanation of THRR including how the numbers were predicted and where they are located for reference       Expected Outcomes  Short Term: Able to state/look up THRR;Long Term: Able to use THRR to govern intensity when exercising independently;Short Term: Able to use daily as guideline for intensity in rehab       Able to check pulse independently  Yes       Intervention  Provide education and demonstration on how to check pulse in carotid and radial arteries.;Review the importance of being able to check your own pulse for safety during independent exercise       Expected Outcomes  Short Term: Able to  explain why pulse checking is important during independent exercise;Long Term: Able to check pulse independently and accurately       Understanding of Exercise Prescription  Yes       Intervention  Provide education, explanation, and written materials on patient's individual exercise prescription       Expected Outcomes  Short Term: Able to explain program exercise prescription;Long Term: Able to explain home exercise prescription to exercise independently          Exercise Goals Re-Evaluation : Exercise Goals Re-Evaluation    Row Name 12/11/17 0818             Exercise Goal Re-Evaluation   Exercise Goals Review  Increase Physical  Activity;Increase Strength and Stamina;Able to understand and use rate of perceived exertion (RPE) scale;Knowledge and understanding of Target Heart Rate Range (THRR)       Comments  Reviewed RPE scale, THR and program prescription with pt today.  Pt voiced understanding and was given a copy of goals to take home.        Expected Outcomes  Short: Use RPE daily to regulate intensity.  Long: Follow program prescription in THR.          Discharge Exercise Prescription (Final Exercise Prescription Changes): Exercise Prescription Changes - 12/01/17 1300      Response to Exercise   Blood Pressure (Admit)  144/74    Blood Pressure (Exercise)  156/74    Blood Pressure (Exit)  146/72    Heart Rate (Admit)  73 bpm    Heart Rate (Exercise)  98 bpm    Heart Rate (Exit)  75 bpm    Oxygen Saturation (Admit)  95 %    Oxygen Saturation (Exercise)  90 %    Rating of Perceived Exertion (Exercise)  13    Perceived Dyspnea (Exercise)  3    Symptoms  SOB    Comments  walk test results       Nutrition:  Target Goals: Understanding of nutrition guidelines, daily intake of sodium '1500mg'$ , cholesterol '200mg'$ , calories 30% from fat and 7% or less from saturated fats, daily to have 5 or more servings of fruits and vegetables.  Biometrics: Pre Biometrics - 12/01/17 1532      Pre Biometrics   Height  5' 9.1" (1.755 m)    Weight  202 lb 4.8 oz (91.8 kg)    Waist Circumference  39.5 inches    Hip Circumference  44 inches    Waist to Hip Ratio  0.9 %    BMI (Calculated)  29.79    Single Leg Stand  4.8 seconds        Nutrition Therapy Plan and Nutrition Goals: Nutrition Therapy & Goals - 12/01/17 1249      Intervention Plan   Intervention  Prescribe, educate and counsel regarding individualized specific dietary modifications aiming towards targeted core components such as weight, hypertension, lipid management, diabetes, heart failure and other comorbidities.;Nutrition handout(s) given to patient.     Expected Outcomes  Short Term Goal: Understand basic principles of dietary content, such as calories, fat, sodium, cholesterol and nutrients.;Short Term Goal: A plan has been developed with personal nutrition goals set during dietitian appointment.;Long Term Goal: Adherence to prescribed nutrition plan.       Nutrition Assessments: Nutrition Assessments - 12/01/17 1249      MEDFICTS Scores   Pre Score  49       Nutrition Goals Re-Evaluation:   Nutrition Goals Discharge (Final Nutrition Goals Re-Evaluation):  Psychosocial: Target Goals: Acknowledge presence or absence of significant depression and/or stress, maximize coping skills, provide positive support system. Participant is able to verbalize types and ability to use techniques and skills needed for reducing stress and depression.   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 However, his shortness of breath limits that      Family Dynamics   Good Support System?  Yes wife, children      Barriers   Psychosocial barriers to participate in program  There are no identifiable barriers or psychosocial needs.;Psychosocial barriers identified (see note);The patient should benefit from training in stress management and relaxation.      Screening Interventions   Interventions  Encouraged to exercise;Provide feedback about the scores to participant;Program counselor consult;To provide support and resources with identified psychosocial needs    Expected Outcomes  Short Term goal: Utilizing psychosocial counselor, staff and physician to assist with identification of specific Stressors or current issues interfering with healing process.  Setting desired goal for each stressor or current issue identified.;Long Term Goal: Stressors or current issues are controlled or eliminated.;Short Term goal: Identification and review with participant of any Quality of Life or Depression concerns found by scoring the questionnaire.;Long Term goal: The participant improves quality of Life and PHQ9 Scores as seen by post scores and/or verbalization of changes       Quality of Life Scores:  Quality of Life - 12/01/17 1049      Quality of Life Scores   Health/Function Pre  24.7 %    Socioeconomic Pre  30 %    Psych/Spiritual Pre  29.14 %    Family Pre  30 %    GLOBAL Pre  27.49 %      Scores of 19 and below usually indicate a poorer quality of life in these areas.  A difference of  2-3 points is a clinically meaningful difference.  A difference of 2-3 points in the total score of the Quality of Life Index has been associated with significant improvement in overall quality of life, self-image, physical symptoms, and general health in studies assessing change in quality of life.  PHQ-9: Recent Review Flowsheet Data    Depression screen [facility] 2/9 12/01/2017   Decreased Interest 0   Down, Depressed, Hopeless 0   PHQ - 2 Score 0   Altered sleeping 0   Tired, decreased energy 0   Change in appetite 0   Feeling bad or failure about yourself  0   Trouble concentrating 0   Moving slowly or fidgety/restless 0   Suicidal thoughts 0   PHQ-9 Score 0     Interpretation of Total Score  Total Score Depression Severity:  1-4 = Minimal depression, 5-9 = Mild depression, 10-14 = Moderate depression, 15-19 = Moderately severe depression, 20-27 = Severe depression   Psychosocial Evaluation and Intervention: Psychosocial Evaluation - 12/16/17 0951      Psychosocial Evaluation & Interventions   Interventions  Encouraged to exercise with the program and follow exercise prescription;Stress management education    Comments  Counselor met with Mr. Telford  [facility]) today for initial psychosocial evaluation.  He is a 77 year old  who had heart valve replacement approximately 6 weeks ago.  Eduard Clos has a strong support system with a spouse of 82 years; (4) adult children who live locally; and he is actively involved in his local church.  Eduard Clos reports having hearing problems and COPD in addition to his cardiac health concerns.  He sleeps well and has a good appetite.  Eduard Clos denies a history of depression or anxiety or any current symptoms and is typically in a positive mood.  Eduard Clos states he has minimal stress in his life other than his concern on occasion about his adult children.  He has goals to breathe better and increase his stamina while in this program.  Staff will follow with Charlie.    Expected Outcomes  Short: Eduard Clos will attend the educational workshop on Stress and anxiety to cope better with his concerns for his family.   Long Term:   Eduard Clos will learn to exercise more consistently for stress and his health goals.    Continue Psychosocial Services   Follow up required by staff       Psychosocial Re-Evaluation:   Psychosocial Discharge (Final Psychosocial Re-Evaluation):   Vocational Rehabilitation: Provide vocational rehab assistance to qualifying candidates.   Vocational Rehab Evaluation & Intervention: Vocational Rehab - 12/01/17 1252      Initial Vocational Rehab Evaluation & Intervention   Assessment shows need for Vocational Rehabilitation  No       Education: Education Goals: Education classes will be provided on a variety of topics geared toward better understanding of heart health and risk factor modification. Participant will state understanding/return demonstration of topics presented as noted by education test scores.  Learning Barriers/Preferences: Learning Barriers/Preferences - 12/01/17 1251      Learning Barriers/Preferences   Learning Barriers  Hearing;Sight;Exercise Concerns    Learning Preferences   Individual Instruction       Education Topics:  AED/CPR: - Group verbal and written instruction with the use of models to demonstrate the basic use of the AED with the basic ABC's of resuscitation.   General Nutrition Guidelines/Fats and Fiber: -Group instruction provided by verbal, written material, models and posters to present the general guidelines for heart healthy nutrition. Gives an explanation and review of dietary fats and fiber.   Cardiac Rehab from 12/16/2017 in [facility] Cardiac and Pulmonary Rehab  Date  12/16/17  Educator  PI  Instruction Review Code  1- Verbalizes Understanding      Controlling Sodium/Reading Food Labels: -Group verbal and written material supporting the discussion of sodium use in heart healthy nutrition. Review and explanation with models, verbal and written materials for utilization of the food label.   Exercise Physiology & General Exercise Guidelines: - Group verbal and written instruction with models to review the exercise physiology of the cardiovascular system and associated critical values. Provides general exercise guidelines with specific guidelines to those with heart or lung disease.    Aerobic Exercise & Resistance Training: - Gives group verbal and written instruction on the various components of exercise. Focuses on aerobic and resistive training programs and the benefits of this training and how to safely progress through these programs..   Flexibility, Balance, Mind/Body Relaxation: Provides group verbal/written instruction on the benefits of flexibility and balance training, including mind/body exercise modes such as yoga, pilates and tai chi.  Demonstration and skill practice provided.   Stress and Anxiety: - Provides group verbal and written instruction about the health risks of elevated stress and causes of high stress.  Discuss the correlation  between heart/lung disease and anxiety and treatment options. Review healthy ways to  manage with stress and anxiety.   Depression: - Provides group verbal and written instruction on the correlation between heart/lung disease and depressed mood, treatment options, and the stigmas associated with seeking treatment.   Anatomy & Physiology of the Heart: - Group verbal and written instruction and models provide basic cardiac anatomy and physiology, with the coronary electrical and arterial systems. Review of Valvular disease and Heart Failure   Cardiac Rehab from 12/16/2017 in [facility] Cardiac and Pulmonary Rehab  Date  12/11/17  Educator  CE  Instruction Review Code  1- Verbalizes Understanding      Cardiac Procedures: - Group verbal and written instruction to review commonly prescribed medications for heart disease. Reviews the medication, class of the drug, and side effects. Includes the steps to properly store meds and maintain the prescription regimen. (beta blockers and nitrates)   Cardiac Medications I: - Group verbal and written instruction to review commonly prescribed medications for heart disease. Reviews the medication, class of the drug, and side effects. Includes the steps to properly store meds and maintain the prescription regimen.   Cardiac Medications II: -Group verbal and written instruction to review commonly prescribed medications for heart disease. Reviews the medication, class of the drug, and side effects. (all other drug classes)    Go Sex-Intimacy & Heart Disease, Get SMART - Goal Setting: - Group verbal and written instruction through game format to discuss heart disease and the return to sexual intimacy. Provides group verbal and written material to discuss and apply goal setting through the application of the S.M.A.R.T. Method.   Other Matters of the Heart: - Provides group verbal, written materials and models to describe Stable Angina and Peripheral Artery. Includes description of the disease process and treatment options available to the cardiac  patient.   Exercise & Equipment Safety: - Individual verbal instruction and demonstration of equipment use and safety with use of the equipment.   Cardiac Rehab from 12/16/2017 in [facility] Cardiac and Pulmonary Rehab  Date  12/01/17  Educator  [facility]  Instruction Review Code  1- Verbalizes Understanding      Infection Prevention: - Provides verbal and written material to individual with discussion of infection control including proper hand washing and proper equipment cleaning during exercise session.   Cardiac Rehab from 12/16/2017 in [facility] Cardiac and Pulmonary Rehab  Date  12/01/17  Educator  [facility]  Instruction Review Code  1- Verbalizes Understanding      Falls Prevention: - Provides verbal and written material to individual with discussion of falls prevention and safety.   Cardiac Rehab from 12/16/2017 in [facility] Cardiac and Pulmonary Rehab  Date  12/01/17  Educator  [facility]  Instruction Review Code  1- Verbalizes Understanding      Diabetes: - Individual verbal and written instruction to review signs/symptoms of diabetes, desired ranges of glucose level fasting, after meals and with exercise. Acknowledge that pre and post exercise glucose checks will be done for 3 sessions at entry of program.   Know Your Numbers and Risk Factors: -Group verbal and written instruction about important numbers in your health.  Discussion of what are risk factors and how they play a role in the disease process.  Review of Cholesterol, Blood Pressure, Diabetes, and BMI and the role they play in your overall health.   Sleep Hygiene: -Provides group verbal and written instruction about how sleep can affect your health.  Define sleep hygiene, discuss sleep  cycles and impact of sleep habits. Review good sleep hygiene tips.    Other: -Provides group and verbal instruction on various topics (see comments)   Knowledge Questionnaire Score: Knowledge Questionnaire Score - 12/01/17 1048      Knowledge Questionnaire  Score   Pre Score  19/28 correct answers reviewed with Juanda Crumble       Core Components/Risk Factors/Patient Goals at Admission: Personal Goals and Risk Factors at Admission - 12/01/17 1247      Core Components/Risk Factors/Patient Goals on Admission    Weight Management  Yes;Weight Loss    Intervention  Weight Management: Develop a combined nutrition and exercise program designed to reach desired caloric intake, while maintaining appropriate intake of nutrient and fiber, sodium and fats, and appropriate energy expenditure required for the weight goal.;Weight Management: Provide education and appropriate resources to help participant work on and attain dietary goals.;Weight Management/Obesity: Establish reasonable short term and long term weight goals.    Admit Weight  193 lb (87.5 kg) at home, weighs every morning; 202 lb on CR's scale    Goal Weight: Short Term  188 lb (85.3 kg)    Goal Weight: Long Term  185 lb (83.9 kg)    Expected Outcomes  Short Term: Continue to assess and modify interventions until short term weight is achieved;Long Term: Adherence to nutrition and physical activity/exercise program aimed toward attainment of established weight goal;Weight Loss: Understanding of general recommendations for a balanced deficit meal plan, which promotes 1-2 lb weight loss per week and includes a negative energy balance of [phone] kcal/d;Understanding recommendations for meals to include 15-35% energy as protein, 25-35% energy from fat, 35-60% energy from carbohydrates, less than '200mg'$  of dietary cholesterol, 20-35 gm of total fiber daily;Understanding of distribution of calorie intake throughout the day with the consumption of 4-5 meals/snacks    Heart Failure  Yes    Intervention  Provide a combined exercise and nutrition program that is supplemented with education, support and counseling about heart failure. Directed toward relieving symptoms such as shortness of breath, decreased exercise  tolerance, and extremity edema.    Expected Outcomes  Improve functional capacity of life;Short term: Attendance in program 2-3 days a week with increased exercise capacity. Reported lower sodium intake. Reported increased fruit and vegetable intake. Reports medication compliance.;Short term: Daily weights obtained and reported for increase. Utilizing diuretic protocols set by physician.;Long term: Adoption of self-care skills and reduction of barriers for early signs and symptoms recognition and intervention leading to self-care maintenance.    Hypertension  Yes    Intervention  Provide education on lifestyle modifcations including regular physical activity/exercise, weight management, moderate sodium restriction and increased consumption of fresh fruit, vegetables, and low fat dairy, alcohol moderation, and smoking cessation.;Monitor prescription use compliance.    Expected Outcomes  Short Term: Continued assessment and intervention until BP is < 140/37m HG in hypertensive participants. < 130/874mHG in hypertensive participants with diabetes, heart failure or chronic kidney disease.;Long Term: Maintenance of blood pressure at goal levels.    Lipids  Yes    Intervention  Provide education and support for participant on nutrition & aerobic/resistive exercise along with prescribed medications to achieve LDL '70mg'$ , HDL >'40mg'$ .    Expected Outcomes  Short Term: Participant states understanding of desired cholesterol values and is compliant with medications prescribed. Participant is following exercise prescription and nutrition guidelines.;Long Term: Cholesterol controlled with medications as prescribed, with individualized exercise RX and with personalized nutrition plan. Value goals: LDL < '70mg'$ , HDL >  40 mg.    Stress  Yes G[place]lin wants to get back to doing more around the farm and other hobbies, such as trapping and hunting. However, his shortness of breath limits that    Intervention  Offer individual  and/or small group education and counseling on adjustment to heart disease, stress management and health-related lifestyle change. Teach and support self-help strategies.;Refer participants experiencing significant psychosocial distress to appropriate mental health specialists for further evaluation and treatment. When possible, include family members and significant others in education/counseling sessions.    Expected Outcomes  Short Term: Participant demonstrates changes in health-related behavior, relaxation and other stress management skills, ability to obtain effective social support, and compliance with psychotropic medications if prescribed.;Long Term: Emotional wellbeing is indicated by absence of clinically significant psychosocial distress or social isolation.       Core Components/Risk Factors/Patient Goals Review:    Core Components/Risk Factors/Patient Goals at Discharge (Final Review):    ITP Comments: ITP Comments    Row Name 12/01/17 1241 12/17/17 0624         ITP Comments  Med Review completed. Initial ITP created. Diagnosis can be found in [facility] 10/06/17  30 Day review. Continue with ITP unless directed changes per Medical Director review.   First day 3/21         Comments:

## 2017-12-18 DIAGNOSIS — Z952 Presence of prosthetic heart valve: Secondary | ICD-10-CM

## 2017-12-18 NOTE — Progress Notes (Signed)
Daily Session Note  Patient Details  Name: Benjamin Frost MRN: [id number] Date of Birth: Sep 02, 1941 Referring Provider:     Cardiac Rehab from 12/01/2017 in [facility] Cardiac and Pulmonary Rehab  Referring Provider  Kathlyn Sacramento MD [Dr. Sherren Mocha (TAVR)]      Encounter Date: 12/18/2017  Check In: Session Check In - 12/18/17 0840      Check-In   Location  [facility]-Cardiac & Pulmonary Rehab    Staff Present  Alberteen Sam, MA, RCEP, CCRP, Exercise Physiologist;Amanda Oletta Darter, BA, ACSM CEP, Exercise Physiologist;Carroll Enterkin, RN, BSN    Supervising physician immediately available to respond to emergencies  See telemetry face sheet for immediately available ER MD    Medication changes reported      No    Fall or balance concerns reported     No    Warm-up and Cool-down  Performed on first and last piece of equipment    Resistance Training Performed  Yes    VAD Patient?  No      Pain Assessment   Currently in Pain?  No/denies    Multiple Pain Sites  No          Social History   Tobacco Use  Smoking Status Former Smoker  . Years: 0.00  . Types: Cigarettes  . Last attempt to quit: 1992  . Years since quitting: 27.2  Smokeless Tobacco Former Systems developer  . Types: Snuff  Tobacco Comment   quit 25 years ago    Goals Met:  Independence with exercise equipment Exercise tolerated well No report of cardiac concerns or symptoms Strength training completed today  Goals Unmet:  Not Applicable  Comments: Reviewed home exercise with pt today.  Pt plans to walk and go to the [place] for exercise.  Reviewed THR, pulse, RPE, sign and symptoms, NTG use, and when to call 911 or MD.  Also discussed weather considerations and indoor options.  Pt voiced understanding.    Dr. Emily Filbert is Medical Director for [place] and [facility].

## 2017-12-23 ENCOUNTER — Encounter: Payer: Medicare Other | Attending: Cardiovascular Disease | Admitting: *Deleted

## 2017-12-23 DIAGNOSIS — Z952 Presence of prosthetic heart valve: Secondary | ICD-10-CM | POA: Diagnosis present

## 2017-12-23 NOTE — Progress Notes (Signed)
Daily Session Note  Patient Details  Name: Benjamin Frost MRN: [id number] Date of Birth: [birth year] Referring Provider:     Cardiac Rehab from 12/01/2017 in [facility] Cardiac and Pulmonary Rehab  Referring Provider  Kathlyn Sacramento MD [Dr. Sherren Mocha (TAVR)]      Encounter Date: 12/23/2017  Check In: Session Check In - 12/23/17 0836      Check-In   Location  [facility]-Cardiac & Pulmonary Rehab    Staff Present  Heath Lark, RN, BSN, CCRP;Jessica [place], MA, RCEP, CCRP, Exercise Physiologist;Amanda Oletta Darter, [place], ACSM CEP, Exercise Physiologist    Supervising physician immediately available to respond to emergencies  See telemetry face sheet for immediately available ER MD    Medication changes reported      No    Fall or balance concerns reported     No    Warm-up and Cool-down  Performed on first and last piece of equipment    Resistance Training Performed  Yes    VAD Patient?  No      Pain Assessment   Currently in Pain?  No/denies          Social History   Tobacco Use  Smoking Status Former Smoker  . Years: 0.00  . Types: Cigarettes  . Last attempt to quit: 1992  . Years since quitting: 27.2  Smokeless Tobacco Former Systems developer  . Types: Snuff  Tobacco Comment   quit 25 years ago    Goals Met:  Independence with exercise equipment Exercise tolerated well No report of cardiac concerns or symptoms Strength training completed today  Goals Unmet:  Not Applicable  Comments: Pt able to follow exercise prescription today without complaint.  Will continue to monitor for progression.    Dr. Emily Filbert is Medical Director for [place] and [facility].

## 2017-12-25 ENCOUNTER — Encounter: Payer: Medicare Other | Admitting: *Deleted

## 2017-12-25 DIAGNOSIS — Z952 Presence of prosthetic heart valve: Secondary | ICD-10-CM

## 2017-12-25 NOTE — Progress Notes (Signed)
Daily Session Note  Patient Details  Name: Benjamin Frost MRN: [id number] Date of Birth: 1941/08/31 Referring Provider:     Cardiac Rehab from 12/01/2017 in [facility] Cardiac and Pulmonary Rehab  Referring Provider  Kathlyn Sacramento MD [Dr. Sherren Mocha (TAVR)]      Encounter Date: 12/25/2017  Check In: Session Check In - 12/25/17 0829      Check-In   Location  [facility]-Cardiac & Pulmonary Rehab    Staff Present  Alberteen Sam, MA, RCEP, CCRP, Exercise Physiologist;Amanda Oletta Darter, BA, ACSM CEP, Exercise Physiologist;Carroll Enterkin, RN, BSN    Supervising physician immediately available to respond to emergencies  See telemetry face sheet for immediately available ER MD    Medication changes reported      No    Fall or balance concerns reported     Yes    Warm-up and Cool-down  Performed on first and last piece of equipment    Resistance Training Performed  Yes    VAD Patient?  No      Pain Assessment   Currently in Pain?  No/denies          Social History   Tobacco Use  Smoking Status Former Smoker  . Years: 0.00  . Types: Cigarettes  . Last attempt to quit: 1992  . Years since quitting: 27.2  Smokeless Tobacco Former Systems developer  . Types: Snuff  Tobacco Comment   quit 25 years ago    Goals Met:  Independence with exercise equipment Exercise tolerated well No report of cardiac concerns or symptoms Strength training completed today  Goals Unmet:  Not Applicable  Comments: Pt able to follow exercise prescription today without complaint.  Will continue to monitor for progression.    Dr. Emily Filbert is Medical Director for [place] and [facility].

## 2017-12-30 DIAGNOSIS — Z952 Presence of prosthetic heart valve: Secondary | ICD-10-CM

## 2017-12-30 NOTE — Progress Notes (Signed)
Daily Session Note  Patient Details  Name: Benjamin Frost MRN: [id number] Date of Birth: November 03, 1940 Referring Provider:     Cardiac Rehab from 12/01/2017 in [facility] Cardiac and Pulmonary Rehab  Referring Provider  Kathlyn Sacramento MD [Dr. Sherren Mocha (TAVR)]      Encounter Date: 12/30/2017  Check In: Session Check In - 12/30/17 0837      Check-In   Location  [facility]-Cardiac & Pulmonary Rehab    Staff Present  Alberteen Sam, MA, RCEP, CCRP, Exercise Physiologist;Amanda Oletta Darter, BA, ACSM CEP, Exercise Physiologist;Susanne Bice, RN, BSN, CCRP    Supervising physician immediately available to respond to emergencies  See telemetry face sheet for immediately available ER MD    Medication changes reported      No    Fall or balance concerns reported     No    Warm-up and Cool-down  Performed on first and last piece of equipment    Resistance Training Performed  Yes    VAD Patient?  No      Pain Assessment   Currently in Pain?  No/denies    Multiple Pain Sites  No          Social History   Tobacco Use  Smoking Status Former Smoker  . Years: 0.00  . Types: Cigarettes  . Last attempt to quit: 1992  . Years since quitting: 27.2  Smokeless Tobacco Former Systems developer  . Types: Snuff  Tobacco Comment   quit 25 years ago    Goals Met:  Independence with exercise equipment Exercise tolerated well No report of cardiac concerns or symptoms Strength training completed today  Goals Unmet:  Not Applicable  Comments: Pt able to follow exercise prescription today without complaint.  Will continue to monitor for progression.    Dr. Emily Filbert is Medical Director for [place] and [facility].

## 2018-01-01 DIAGNOSIS — Z952 Presence of prosthetic heart valve: Secondary | ICD-10-CM | POA: Diagnosis not present

## 2018-01-01 NOTE — Progress Notes (Signed)
Daily Session Note  Patient Details  Name: Benjamin Frost MRN: [id number] Date of Birth: 1941/03/19 Referring Provider:     Cardiac Rehab from 12/01/2017 in [facility] Cardiac and Pulmonary Rehab  Referring Provider  Kathlyn Sacramento MD [Dr. Sherren Mocha (TAVR)]      Encounter Date: 01/01/2018  Check In: Session Check In - 01/01/18 0838      Check-In   Location  [facility]-Cardiac & Pulmonary Rehab    Staff Present  Alberteen Sam, MA, RCEP, CCRP, Exercise Physiologist;Lidya Mccalister Oletta Darter, BA, ACSM CEP, Exercise Physiologist;Carroll Enterkin, RN, BSN    Supervising physician immediately available to respond to emergencies  See telemetry face sheet for immediately available ER MD    Medication changes reported      No    Fall or balance concerns reported     No    Warm-up and Cool-down  Performed on first and last piece of equipment    Resistance Training Performed  Yes    VAD Patient?  No      Pain Assessment   Currently in Pain?  No/denies    Multiple Pain Sites  No          Social History   Tobacco Use  Smoking Status Former Smoker  . Years: 0.00  . Types: Cigarettes  . Last attempt to quit: 1992  . Years since quitting: 27.2  Smokeless Tobacco Former Systems developer  . Types: Snuff  Tobacco Comment   quit 25 years ago    Goals Met:  Independence with exercise equipment Exercise tolerated well No report of cardiac concerns or symptoms Strength training completed today  Goals Unmet:  Not Applicable  Comments: Pt able to follow exercise prescription today without complaint.  Will continue to monitor for progression.    Dr. Emily Filbert is Medical Director for [place] and [facility].

## 2018-01-06 ENCOUNTER — Encounter: Payer: Medicare Other | Admitting: *Deleted

## 2018-01-06 DIAGNOSIS — Z952 Presence of prosthetic heart valve: Secondary | ICD-10-CM

## 2018-01-06 NOTE — Progress Notes (Signed)
Daily Session Note  Patient Details  Name: Benjamin Frost MRN: [id number] Date of Birth: 06-10-1941 Referring Provider:     Cardiac Rehab from 12/01/2017 in [facility] Cardiac and Pulmonary Rehab  Referring Provider  Kathlyn Sacramento MD [Dr. Sherren Mocha (TAVR)]      Encounter Date: 01/06/2018  Check In: Session Check In - 01/06/18 0941      Check-In   Location  [facility]-Cardiac & Pulmonary Rehab    Staff Present  Heath Lark, RN, BSN, CCRP;Bedie Dominey [place], MA, RCEP, CCRP, Exercise Physiologist;Amanda Oletta Darter, [place], ACSM CEP, Exercise Physiologist    Supervising physician immediately available to respond to emergencies  See telemetry face sheet for immediately available ER MD    Medication changes reported      No    Fall or balance concerns reported     No    Warm-up and Cool-down  Performed on first and last piece of equipment    Resistance Training Performed  Yes    VAD Patient?  No      Pain Assessment   Currently in Pain?  No/denies          Social History   Tobacco Use  Smoking Status Former Smoker  . Years: 0.00  . Types: Cigarettes  . Last attempt to quit: 1992  . Years since quitting: 27.3  Smokeless Tobacco Former Systems developer  . Types: Snuff  Tobacco Comment   quit 25 years ago    Goals Met:  Independence with exercise equipment Exercise tolerated well No report of cardiac concerns or symptoms Strength training completed today  Goals Unmet:  Not Applicable  Comments: Pt able to follow exercise prescription today without complaint.  Will continue to monitor for progression.    Dr. Emily Filbert is Medical Director for [place] and [facility].

## 2018-01-08 ENCOUNTER — Encounter: Payer: Medicare Other | Admitting: *Deleted

## 2018-01-08 DIAGNOSIS — Z952 Presence of prosthetic heart valve: Secondary | ICD-10-CM

## 2018-01-08 NOTE — Progress Notes (Signed)
Daily Session Note  Patient Details  Name: Benjamin Frost MRN: [id number] Date of Birth: 1941/02/20 Referring Provider:     Cardiac Rehab from 12/01/2017 in [facility] Cardiac and Pulmonary Rehab  Referring Provider  Kathlyn Sacramento MD [Dr. Sherren Mocha (TAVR)]      Encounter Date: 01/08/2018  Check In: Session Check In - 01/08/18 0911      Check-In   Location  [facility]-Cardiac & Pulmonary Rehab    Staff Present  Alberteen Sam, MA, RCEP, CCRP, Exercise Physiologist;Amanda Oletta Darter, BA, ACSM CEP, Exercise Physiologist;Carroll Enterkin, RN, BSN    Supervising physician immediately available to respond to emergencies  See telemetry face sheet for immediately available ER MD    Medication changes reported      No    Fall or balance concerns reported     No    Warm-up and Cool-down  Performed on first and last piece of equipment    Resistance Training Performed  Yes    VAD Patient?  No      Pain Assessment   Currently in Pain?  No/denies          Social History   Tobacco Use  Smoking Status Former Smoker  . Years: 0.00  . Types: Cigarettes  . Last attempt to quit: 1992  . Years since quitting: 27.3  Smokeless Tobacco Former Systems developer  . Types: Snuff  Tobacco Comment   quit 25 years ago    Goals Met:  Independence with exercise equipment Exercise tolerated well No report of cardiac concerns or symptoms Strength training completed today  Goals Unmet:  Not Applicable  Comments: Pt able to follow exercise prescription today without complaint.  Will continue to monitor for progression.    Dr. Emily Filbert is Medical Director for [place] and [facility].

## 2018-01-14 ENCOUNTER — Encounter: Payer: Self-pay | Admitting: *Deleted

## 2018-01-14 DIAGNOSIS — Z952 Presence of prosthetic heart valve: Secondary | ICD-10-CM

## 2018-01-14 NOTE — Progress Notes (Signed)
Cardiac Individual Treatment Plan  Patient Details  Name: Benjamin Frost MRN: [id number] Date of Birth: Sep 30, 1940 Referring Provider:     Cardiac Rehab from 12/01/2017 in [facility] Cardiac and Pulmonary Rehab  Referring Provider  Kathlyn Sacramento MD [Dr. Sherren Mocha (TAVR)]      Initial Encounter Date:    Cardiac Rehab from 12/01/2017 in [facility] Cardiac and Pulmonary Rehab  Date  12/01/17  Referring Provider  Kathlyn Sacramento MD [Dr. Sherren Mocha (TAVR)]      Visit Diagnosis: S/P TAVR (transcatheter aortic valve replacement)  Patient's Home Medications on Admission:  Current Outpatient Medications:  .  albuterol (PROAIR HFA) 108 (90 BASE) MCG/ACT inhaler, Inhale 2 puffs into the lungs every 6 (six) hours as needed for wheezing or shortness of breath., Disp: , Rfl:  .  amoxicillin (AMOXIL) 500 MG tablet, Take 2,000 mg (four 500 mg tablets) one hour prior to dental appointments., Disp: 4 tablet, Rfl: 6 .  aspirin 81 MG tablet, Take 81 mg by mouth daily., Disp: , Rfl:  .  atorvastatin (LIPITOR) 40 MG tablet, Take 40 mg by mouth daily., Disp: , Rfl:  .  carvedilol (COREG) 3.125 MG tablet, Take 1 tablet (3.125 mg total) by mouth 2 (two) times daily., Disp: 60 tablet, Rfl: 3 .  clopidogrel (PLAVIX) 75 MG tablet, Take 1 tablet (75 mg total) by mouth daily with breakfast., Disp: 30 tablet, Rfl: 5 .  Fluticasone-Salmeterol (ADVAIR) 250-50 MCG/DOSE AEPB, Inhale 1 puff into the lungs 2 (two) times daily., Disp: , Rfl:  .  ivabradine (CORLANOR) 5 MG TABS tablet, Take 1 tablet (5 mg total) by mouth 2 (two) times daily with a meal., Disp: 60 tablet, Rfl: 3 .  losartan (COZAAR) 25 MG tablet, Take 1 tablet (25 mg total) by mouth daily., Disp: 30 tablet, Rfl: 3 .  pantoprazole (PROTONIX) 40 MG tablet, Take 1 tablet (40 mg total) by mouth daily., Disp: 30 tablet, Rfl: 6 .  spironolactone (ALDACTONE) 25 MG tablet, Take 1 tablet (25 mg total) by mouth daily., Disp: 30 tablet, Rfl: 0 .  tamsulosin (FLOMAX)  0.4 MG CAPS capsule, Take 0.4 mg by mouth daily., Disp: , Rfl:  .  tiotropium (SPIRIVA) 18 MCG inhalation capsule, Place 18 mcg into inhaler and inhale daily., Disp: , Rfl:   Past Medical History: Past Medical History:  Diagnosis Date  . AICD (automatic cardioverter/defibrillator) present    a. St Jude, placed after VF arrest during a dobuatamine echo.   . Aortic stenosis    a. mod-severe low flow low gradient AS.   Marland Kitchen Aortic stenosis, severe    a. 11/04/17: s/p TAVR by Dr. Burt Knack and Dr. Cyndia Bent.   . Chronic systolic heart failure ([place])   . COPD (chronic obstructive pulmonary disease) ([place])   . GERD (gastroesophageal reflux disease)   . History of BPH   . History of kidney stones   . Hyperlipidemia   . Hypertension   . Left bundle branch block   . S/P TAVR (transcatheter aortic valve replacement)    a. 10/2017: s/p TAVR with an Edwards Sapien 3 THV (size 26 mm, model # 9600TFX, serial # [id number])  . Sinus tachycardia     Tobacco Use: Social History   Tobacco Use  Smoking Status Former Smoker  . Years: 0.00  . Types: Cigarettes  . Last attempt to quit: 1992  . Years since quitting: 27.3  Smokeless Tobacco Former Systems developer  . Types: Snuff  Tobacco Comment   quit 25 years ago  Labs: Recent Review Flowsheet Data    Labs for ITP Cardiac and Pulmonary Rehab Latest Ref Rng & Units 05/16/2017 05/16/2017 10/31/2017 11/04/2017 11/04/2017   Cholestrol 0 - 200 mg/dL - - - - -   LDLCALC 0 - 100 mg/dL - - - - -   HDL 40 - 60 mg/dL - - - - -   Trlycerides 0 - 200 mg/dL - - - - -   Hemoglobin A1c 4.8 - 5.6 % - - 5.4 - -   PHART 7.350 - 7.450 7.402 - 7.432 - -   PCO2ART 32.0 - 48.0 mmHg 40.4 - 41.5 - -   HCO3 20.0 - 28.0 mmol/L 25.2 26.3 27.2 - -   TCO2 22 - 32 mmol/L 26 28 - 29 29   O2SAT % 95.0 67.0 95.5 - -       Exercise Target Goals:    Exercise Program Goal: Individual exercise prescription set using results from initial 6 min walk test and THRR while considering  patient's  activity barriers and safety.   Exercise Prescription Goal: Initial exercise prescription builds to 30-45 minutes a day of aerobic activity, 2-3 days per week.  Home exercise guidelines will be given to patient during program as part of exercise prescription that the participant will acknowledge.  Activity Barriers & Risk Stratification: Activity Barriers & Cardiac Risk Stratification - 12/01/17 1254      Activity Barriers & Cardiac Risk Stratification   Activity Barriers  Arthritis;Shortness of Breath    Cardiac Risk Stratification  Moderate       6 Minute Walk: 6 Minute Walk    Row Name 12/01/17 1527         6 Minute Walk   Phase  Initial     Distance  1035 feet     Walk Time  6 minutes     # of Rest Breaks  0     MPH  1.96     METS  2.06     RPE  13     Perceived Dyspnea   3     VO2 Peak  7.2     Symptoms  Yes (comment)     Comments  SOB     Resting HR  73 bpm     Resting BP  144/74     Resting Oxygen Saturation   95 %     Exercise Oxygen Saturation  during 6 min walk  96 %     Max Ex. HR  98 bpm     Max Ex. BP  156/74     2 Minute Post BP  146/72       Interval HR   1 Minute HR  97     2 Minute HR  97     3 Minute HR  96     4 Minute HR  97     5 Minute HR  98     6 Minute HR  96     2 Minute Post HR  75     Interval Heart Rate?  Yes       Interval Oxygen   Interval Oxygen?  Yes     Baseline Oxygen Saturation %  95 %     1 Minute Oxygen Saturation %  92 %     1 Minute Liters of Oxygen  0 L Room Air     2 Minute Oxygen Saturation %  90 %     2 Minute Liters  of Oxygen  0 L     3 Minute Oxygen Saturation %  91 %     3 Minute Liters of Oxygen  0 L     4 Minute Oxygen Saturation %  91 %     4 Minute Liters of Oxygen  0 L     5 Minute Oxygen Saturation %  91 %     5 Minute Liters of Oxygen  0 L     6 Minute Oxygen Saturation %  91 %     6 Minute Liters of Oxygen  0 L     2 Minute Post Oxygen Saturation %  96 %     2 Minute Post Liters of Oxygen  0 L         Oxygen Initial Assessment:   Oxygen Re-Evaluation:   Oxygen Discharge (Final Oxygen Re-Evaluation):   Initial Exercise Prescription: Initial Exercise Prescription - 12/01/17 1500      Date of Initial Exercise RX and Referring Provider   Date  12/01/17    Referring Provider  Kathlyn Sacramento MD Dr. Sherren Mocha (TAVR)      Treadmill   MPH  1.4    Grade  0.5    Minutes  15    METs  2.17      REL-XR   Level  1    Speed  50    Minutes  15    METs  2      T5 Nustep   Level  1    SPM  80    Minutes  15    METs  2      Prescription Details   Frequency (times per week)  2    Duration  Progress to 30 minutes of continuous aerobic without signs/symptoms of physical distress      Intensity   THRR 40-80% of Max Heartrate  101-128    Ratings of Perceived Exertion  11-13    Perceived Dyspnea  0-4      Progression   Progression  Continue to progress workloads to maintain intensity without signs/symptoms of physical distress.      Resistance Training   Training Prescription  Yes    Weight  3 lbs    Reps  10-15       Perform Capillary Blood Glucose checks as needed.  Exercise Prescription Changes: Exercise Prescription Changes    Row Name 12/01/17 1300 12/23/17 1500 01/06/18 1500         Response to Exercise   Blood Pressure (Admit)  144/74  128/60  116/74     Blood Pressure (Exercise)  156/74  108/62  114/56     Blood Pressure (Exit)  146/72  124/70  132/74     Heart Rate (Admit)  73 bpm  82 bpm  77 bpm     Heart Rate (Exercise)  98 bpm  107 bpm  115 bpm     Heart Rate (Exit)  75 bpm  81 bpm  68 bpm     Oxygen Saturation (Admit)  95 %  -  -     Oxygen Saturation (Exercise)  90 %  -  -     Rating of Perceived Exertion (Exercise)  _0 Perceived Dyspnea (Exercise)  3  -  -     Symptoms  SOB  none  none     Comments  walk test results  -  -  Duration  -  Continue with 30 min of aerobic exercise without signs/symptoms of physical distress.   Continue with 30 min of aerobic exercise without signs/symptoms of physical distress.     Intensity  -  THRR unchanged  THRR unchanged       Progression   Progression  -  Continue to progress workloads to maintain intensity without signs/symptoms of physical distress.  Continue to progress workloads to maintain intensity without signs/symptoms of physical distress.     Average METs  -  2.09  2.32       Resistance Training   Training Prescription  -  Yes  Yes     Weight  -  3 lbs  3 lbs     Reps  -  10-15  10-15       Treadmill   MPH  -  1.4  1.4     Grade  -  0.5  0.5     Minutes  -  15  15     METs  -  2.17  2.17       REL-XR   Level  -  1  2     Minutes  -  15  15     METs  -  2  2.9       T5 Nustep   Level  -  1  2     Minutes  -  15  15     METs  -  2  1.9       Home Exercise Plan   Plans to continue exercise at  -  [employer] (comment) walking and the [employer] (comment) walking and the [employer]  -  Add 1 additional day to program exercise sessions.  Add 1 additional day to program exercise sessions.     Initial Home Exercises Provided  -  12/23/17  12/23/17        Exercise Comments: Exercise Comments    Row Name 12/11/17 0818 12/18/17 0940         Exercise Comments  First full day of exercise!  Patient was oriented to gym and equipment including functions, settings, policies, and procedures.  Patient's individual exercise prescription and treatment plan were reviewed.  All starting workloads were established based on the results of the 6 minute walk test done at initial orientation visit.  The plan for exercise progression was also introduced and progression will be customized based on patient's performance and goals.  Reviewed home exercise with pt today.  Pt plans to walk and go to the [place] for exercise.  Reviewed THR, pulse, RPE, sign and symptoms, NTG use, and when to call 911 or MD.  Also discussed weather considerations and indoor  options.  Pt voiced understanding.         Exercise Goals and Review: Exercise Goals    Row Name 12/01/17 1531             Exercise Goals   Increase Physical Activity  Yes       Intervention  Provide advice, education, support and counseling about physical activity/exercise needs.;Develop an individualized exercise prescription for aerobic and resistive training based on initial evaluation findings, risk stratification, comorbidities and participant's personal goals.       Expected Outcomes  Short Term: Attend rehab on a regular basis to increase amount of physical activity.;Long Term: Add in home exercise to make exercise part of routine and  to increase amount of physical activity.;Long Term: Exercising regularly at least 3-5 days a week.       Increase Strength and Stamina  Yes       Intervention  Provide advice, education, support and counseling about physical activity/exercise needs.;Develop an individualized exercise prescription for aerobic and resistive training based on initial evaluation findings, risk stratification, comorbidities and participant's personal goals.       Expected Outcomes  Short Term: Increase workloads from initial exercise prescription for resistance, speed, and METs.;Short Term: Perform resistance training exercises routinely during rehab and add in resistance training at home;Long Term: Improve cardiorespiratory fitness, muscular endurance and strength as measured by increased METs and functional capacity (6MWT)       Able to understand and use rate of perceived exertion (RPE) scale  Yes       Intervention  Provide education and explanation on how to use RPE scale       Expected Outcomes  Short Term: Able to use RPE daily in rehab to express subjective intensity level;Long Term:  Able to use RPE to guide intensity level when exercising independently       Able to understand and use Dyspnea scale  Yes       Intervention  Provide education and explanation on how to  use Dyspnea scale       Expected Outcomes  Short Term: Able to use Dyspnea scale daily in rehab to express subjective sense of shortness of breath during exertion;Long Term: Able to use Dyspnea scale to guide intensity level when exercising independently       Knowledge and understanding of Target Heart Rate Range (THRR)  Yes       Intervention  Provide education and explanation of THRR including how the numbers were predicted and where they are located for reference       Expected Outcomes  Short Term: Able to state/look up THRR;Long Term: Able to use THRR to govern intensity when exercising independently;Short Term: Able to use daily as guideline for intensity in rehab       Able to check pulse independently  Yes       Intervention  Provide education and demonstration on how to check pulse in carotid and radial arteries.;Review the importance of being able to check your own pulse for safety during independent exercise       Expected Outcomes  Short Term: Able to explain why pulse checking is important during independent exercise;Long Term: Able to check pulse independently and accurately       Understanding of Exercise Prescription  Yes       Intervention  Provide education, explanation, and written materials on patient's individual exercise prescription       Expected Outcomes  Short Term: Able to explain program exercise prescription;Long Term: Able to explain home exercise prescription to exercise independently          Exercise Goals Re-Evaluation : Exercise Goals Re-Evaluation    [place] Name 12/11/17 0818 12/18/17 0940 12/23/17 1458 01/06/18 1512       Exercise Goal Re-Evaluation   Exercise Goals Review  Increase Physical Activity;Increase Strength and Stamina;Able to understand and use rate of perceived exertion (RPE) scale;Knowledge and understanding of Target Heart Rate Range (THRR)  Increase Physical Activity;Increase Strength and Stamina;Able to understand and use rate of perceived  exertion (RPE) scale;Knowledge and understanding of Target Heart Rate Range (THRR);Able to check pulse independently;Understanding of Exercise Prescription  Increase Physical Activity;Increase Strength and Stamina;Understanding of Exercise Prescription  Increase Physical Activity;Increase Strength and Stamina;Understanding of Exercise Prescription    Comments  Reviewed RPE scale, THR and program prescription with pt today.  Pt voiced understanding and was given a copy of goals to take home.   Reviewed home exercise with pt today.  Pt plans to walk and go to the [place] for exercise.  Reviewed THR, pulse, RPE, sign and symptoms, NTG use, and when to call 911 or MD.  Also discussed weather considerations and indoor options.  Pt voiced understanding.  Benjamin Frost is off to a good start in rehab.  He has already started to mesh with his classmates.  He should be ready to start to increase his workloads.  We will continue to monitor his progression.   Benjamin Frost has been doing well in rehab.  He is now on the XR at level 2.  We will continue to monitor his progression.    Expected Outcomes  Short: Use RPE daily to regulate intensity.  Long: Follow program prescription in THR.  Short - Pt will add 1 day of exercise in addition to program sessions Long - Pt will exercise on his own  Short: Begin to increase workloads.  Long: Continue to add in 1 extra day of exercise at home.   Short: Move up workloads on T5 NuStep.  Long: Continue to exercise more at home.        Discharge Exercise Prescription (Final Exercise Prescription Changes): Exercise Prescription Changes - 01/06/18 1500      Response to Exercise   Blood Pressure (Admit)  116/74    Blood Pressure (Exercise)  114/56    Blood Pressure (Exit)  132/74    Heart Rate (Admit)  77 bpm    Heart Rate (Exercise)  115 bpm    Heart Rate (Exit)  68 bpm    Rating of Perceived Exertion (Exercise)  12    Symptoms  none    Duration  Continue with 30 min of aerobic exercise  without signs/symptoms of physical distress.    Intensity  THRR unchanged      Progression   Progression  Continue to progress workloads to maintain intensity without signs/symptoms of physical distress.    Average METs  2.32      Resistance Training   Training Prescription  Yes    Weight  3 lbs    Reps  10-15      Treadmill   MPH  1.4    Grade  0.5    Minutes  15    METs  2.17      REL-XR   Level  2    Minutes  15    METs  2.9      T5 Nustep   Level  2    Minutes  15    METs  1.9      Home Exercise Plan   Plans to continue exercise at  [employer] (comment) walking and the [place]    Frequency  Add 1 additional day to program exercise sessions.    Initial Home Exercises Provided  12/23/17       Nutrition:  Target Goals: Understanding of nutrition guidelines, daily intake of sodium <1510m, cholesterol <2066m calories 30% from fat and 7% or less from saturated fats, daily to have 5 or more servings of fruits and vegetables.  Biometrics: Pre Biometrics - 12/01/17 1532      Pre Biometrics   Height  5' 9.1" (1.755 m)    Weight  202 lb 4.8  oz (91.8 kg)    Waist Circumference  39.5 inches    Hip Circumference  44 inches    Waist to Hip Ratio  0.9 %    BMI (Calculated)  29.79    Single Leg Stand  4.8 seconds        Nutrition Therapy Plan and Nutrition Goals: Nutrition Therapy & Goals - 01/01/18 1027      Nutrition Therapy   Diet  TLC    Drug/Food Interactions  Statins/Certain Fruits    Protein (specify units)  8-9oz    Fiber  30 grams    Whole Grain Foods  3 servings    Saturated Fats  14 max. grams    Fruits and Vegetables  6 servings/day 8 ideal    Sodium  2000 grams 1500 ideal      Personal Nutrition Goals   Nutrition Goal  Increase intake of fruits consumed daily    Personal Goal #2  Consume sweets / desserts in moderation    Comments  he currently watches his sodium intake, eats a variety of vegetables and lean meats, and eats out 1x/month       Intervention Plan   Intervention  Prescribe, educate and counsel regarding individualized specific dietary modifications aiming towards targeted core components such as weight, hypertension, lipid management, diabetes, heart failure and other comorbidities.    Expected Outcomes  Short Term Goal: Understand basic principles of dietary content, such as calories, fat, sodium, cholesterol and nutrients.;Short Term Goal: A plan has been developed with personal nutrition goals set during dietitian appointment.;Long Term Goal: Adherence to prescribed nutrition plan.       Nutrition Assessments: Nutrition Assessments - 12/01/17 1249      MEDFICTS Scores   Pre Score  49       Nutrition Goals Re-Evaluation: Nutrition Goals Re-Evaluation    Row Name 12/22/17 0927             Goals   Nutrition Goal  Decrease consumption of fried foods. Choose baked, broiled or grilled instead. When ordering out, opt for vegetables or a baked potato as a side rather than french fries       Comment  Patient currently eats fried foods regularly       Expected Outcome  Patient will choose non-fried meats and sides at least half the time         Personal Goal #2 Re-Evaluation   Personal Goal #2  Choose lower-fat condiments, dressings and dairy products. Look for labeling such as "part skim," "reduced fat," or "light" patient currently chooses full fat condiments, dressings and dairy          Nutrition Goals Discharge (Final Nutrition Goals Re-Evaluation): Nutrition Goals Re-Evaluation - 12/22/17 0927      Goals   Nutrition Goal  Decrease consumption of fried foods. Choose baked, broiled or grilled instead. When ordering out, opt for vegetables or a baked potato as a side rather than french fries    Comment  Patient currently eats fried foods regularly    Expected Outcome  Patient will choose non-fried meats and sides at least half the time      Personal Goal #2 Re-Evaluation   Personal Goal #2  Choose  lower-fat condiments, dressings and dairy products. Look for labeling such as "part skim," "reduced fat," or "light" patient currently chooses full fat condiments, dressings and dairy       Psychosocial: Target Goals: Acknowledge presence or absence of significant depression and/or stress, maximize coping  skills, provide positive support system. Participant is able to verbalize types and ability to use techniques and skills needed for reducing stress and depression.   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 However, his shortness of breath limits that      Family Dynamics   Good Support System?  Yes wife, children      Barriers   Psychosocial barriers to participate in program  There are no identifiable barriers or psychosocial needs.;Psychosocial barriers identified (see note);The patient should benefit from training in stress management and relaxation.      Screening Interventions   Interventions  Encouraged to exercise;Provide feedback about the scores to participant;Program counselor consult;To provide support and resources with identified psychosocial needs    Expected Outcomes  Short Term goal: Utilizing psychosocial counselor, staff and physician to assist with identification of specific Stressors or current issues interfering with healing process. Setting desired goal for each stressor or current issue identified.;Long Term Goal: Stressors or current issues are controlled or eliminated.;Short Term goal: Identification and review with participant of any Quality of Life or Depression concerns found by scoring the questionnaire.;Long Term goal: The participant improves quality of Life  and PHQ9 Scores as seen by post scores and/or verbalization of changes       Quality of Life Scores:  Quality of Life - 12/01/17 1049      Quality of Life Scores   Health/Function Pre  24.7 %    Socioeconomic Pre  30 %    Psych/Spiritual Pre  29.14 %    Family Pre  30 %    GLOBAL Pre  27.49 %      Scores of 19 and below usually indicate a poorer quality of life in these areas.  A difference of  2-3 points is a clinically meaningful difference.  A difference of 2-3 points in the total score of the Quality of Life Index has been associated with significant improvement in overall quality of life, self-image, physical symptoms, and general health in studies assessing change in quality of life.  PHQ-9: Recent Review Flowsheet Data    Depression screen [facility] 2/9 12/01/2017   Decreased Interest 0   Down, Depressed, Hopeless 0   PHQ - 2 Score 0   Altered sleeping 0   Tired, decreased energy 0   Change in appetite 0   Feeling bad or failure about yourself  0   Trouble concentrating 0   Moving slowly or fidgety/restless 0   Suicidal thoughts 0   PHQ-9 Score 0     Interpretation of Total Score  Total Score Depression Severity:  1-4 = Minimal depression, 5-9 = Mild depression, 10-14 = Moderate depression, 15-19 = Moderately severe depression, 20-27 = Severe depression   Psychosocial Evaluation and Intervention: Psychosocial Evaluation - 12/16/17 0951      Psychosocial Evaluation & Interventions   Interventions  Encouraged to exercise with the program and follow exercise prescription;Stress management education    Comments  Counselor met with Mr. [facility]) today for initial psychosocial evaluation.  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  Benjamin Frost has a strong support  system with a spouse of 49 years; (4) adult children who live locally; and he is actively involved in his local church.  Benjamin Frost reports having hearing problems and COPD in  addition to his cardiac health concerns.  He sleeps well and has a good appetite.  Benjamin Frost denies a history of depression or anxiety or any current symptoms and is typically in a positive mood.  Benjamin Frost states he has minimal stress in his life other than his concern on occasion about his adult children.  He has goals to breathe better and increase his stamina while in this program.  Staff will follow with Benjamin Frost.    Expected Outcomes  Short: Benjamin Frost will attend the educational workshop on Stress and anxiety to cope better with his concerns for his family.   Long Term:   Benjamin Frost will learn to exercise more consistently for stress and his health goals.    Continue Psychosocial Services   Follow up required by staff       Psychosocial Re-Evaluation:   Psychosocial Discharge (Final Psychosocial Re-Evaluation):   Vocational Rehabilitation: Provide vocational rehab assistance to qualifying candidates.   Vocational Rehab Evaluation & Intervention: Vocational Rehab - 12/01/17 1252      Initial Vocational Rehab Evaluation & Intervention   Assessment shows need for Vocational Rehabilitation  No       Education: Education Goals: Education classes will be provided on a variety of topics geared toward better understanding of heart health and risk factor modification. Participant will state understanding/return demonstration of topics presented as noted by education test scores.  Learning Barriers/Preferences: Learning Barriers/Preferences - 12/01/17 1251      Learning Barriers/Preferences   Learning Barriers  Hearing;Sight;Exercise Concerns    Learning Preferences  Individual Instruction       Education Topics:  AED/CPR: - Group verbal and written instruction with the use of models to demonstrate the basic use of the AED with the basic ABC's of resuscitation.   Cardiac Rehab from 01/08/2018 in [facility] Cardiac and Pulmonary Rehab  Date  12/25/17  Educator  CE  Instruction Review Code  1-  Verbalizes Understanding      General Nutrition Guidelines/Fats and Fiber: -Group instruction provided by verbal, written material, models and posters to present the general guidelines for heart healthy nutrition. Gives an explanation and review of dietary fats and fiber.   Cardiac Rehab from 01/08/2018 in [facility] Cardiac and Pulmonary Rehab  Date  12/16/17  Educator  PI  Instruction Review Code  1- Verbalizes Understanding      Controlling Sodium/Reading Food Labels: -Group verbal and written material supporting the discussion of sodium use in heart healthy nutrition. Review and explanation with models, verbal and written materials for utilization of the food label.   Cardiac Rehab from 01/08/2018 in [facility] Cardiac and Pulmonary Rehab  Date  12/23/17  Educator  PI  Instruction Review Code  1- Verbalizes Understanding      Exercise Physiology & General Exercise Guidelines: - Group verbal and written instruction with models to review the exercise physiology of the cardiovascular system and associated critical values. Provides general exercise guidelines with specific guidelines to those with heart or lung disease.    Cardiac Rehab from 01/08/2018 in [facility] Cardiac and Pulmonary Rehab  Date  01/01/18  Educator  [facility]  Instruction Review Code  1- Verbalizes Understanding      Aerobic Exercise & Resistance Training: - Gives group verbal and written instruction on the various components of exercise. Focuses on aerobic and resistive  training programs and the benefits of this training and how to safely progress through these programs..   Cardiac Rehab from 01/08/2018 in [facility] Cardiac and Pulmonary Rehab  Date  01/06/18  Educator  AS  Instruction Review Code  1- Verbalizes Understanding      Flexibility, Balance, Mind/Body Relaxation: Provides group verbal/written instruction on the benefits of flexibility and balance training, including mind/body exercise modes such as yoga, pilates and tai chi.   Demonstration and skill practice provided.   Stress and Anxiety: - Provides group verbal and written instruction about the health risks of elevated stress and causes of high stress.  Discuss the correlation between heart/lung disease and anxiety and treatment options. Review healthy ways to manage with stress and anxiety.   Depression: - Provides group verbal and written instruction on the correlation between heart/lung disease and depressed mood, treatment options, and the stigmas associated with seeking treatment.   Cardiac Rehab from 01/08/2018 in [facility] Cardiac and Pulmonary Rehab  Date  01/08/18  Educator  [facility]  Instruction Review Code  1- Verbalizes Understanding      Anatomy & Physiology of the Heart: - Group verbal and written instruction and models provide basic cardiac anatomy and physiology, with the coronary electrical and arterial systems. Review of Valvular disease and Heart Failure   Cardiac Rehab from 01/08/2018 in [facility] Cardiac and Pulmonary Rehab  Date  12/11/17  Educator  CE  Instruction Review Code  1- Verbalizes Understanding      Cardiac Procedures: - Group verbal and written instruction to review commonly prescribed medications for heart disease. Reviews the medication, class of the drug, and side effects. Includes the steps to properly store meds and maintain the prescription regimen. (beta blockers and nitrates)   Cardiac Rehab from 01/08/2018 in [facility] Cardiac and Pulmonary Rehab  Date  12/18/17  Educator  CE  Instruction Review Code  1- Verbalizes Understanding      Cardiac Medications I: - Group verbal and written instruction to review commonly prescribed medications for heart disease. Reviews the medication, class of the drug, and side effects. Includes the steps to properly store meds and maintain the prescription regimen.   Cardiac Medications II: -Group verbal and written instruction to review commonly prescribed medications for heart disease. Reviews the  medication, class of the drug, and side effects. (all other drug classes)    Go Sex-Intimacy & Heart Disease, Get SMART - Goal Setting: - Group verbal and written instruction through game format to discuss heart disease and the return to sexual intimacy. Provides group verbal and written material to discuss and apply goal setting through the application of the S.M.A.R.T. Method.   Cardiac Rehab from 01/08/2018 in [facility] Cardiac and Pulmonary Rehab  Date  12/18/17  Educator  CE  Instruction Review Code  1- Verbalizes Understanding      Other Matters of the Heart: - Provides group verbal, written materials and models to describe Stable Angina and Peripheral Artery. Includes description of the disease process and treatment options available to the cardiac patient.   Exercise & Equipment Safety: - Individual verbal instruction and demonstration of equipment use and safety with use of the equipment.   Cardiac Rehab from 01/08/2018 in [facility] Cardiac and Pulmonary Rehab  Date  12/01/17  Educator  [facility]  Instruction Review Code  1- Verbalizes Understanding      Infection Prevention: - Provides verbal and written material to individual with discussion of infection control including proper hand washing and proper equipment cleaning during  exercise session.   Cardiac Rehab from 01/08/2018 in [facility] Cardiac and Pulmonary Rehab  Date  12/01/17  Educator  [facility]  Instruction Review Code  1- Verbalizes Understanding      Falls Prevention: - Provides verbal and written material to individual with discussion of falls prevention and safety.   Cardiac Rehab from 01/08/2018 in [facility] Cardiac and Pulmonary Rehab  Date  12/01/17  Educator  [facility]  Instruction Review Code  1- Verbalizes Understanding      Diabetes: - Individual verbal and written instruction to review signs/symptoms of diabetes, desired ranges of glucose level fasting, after meals and with exercise. Acknowledge that pre and post exercise glucose  checks will be done for 3 sessions at entry of program.   Know Your Numbers and Risk Factors: -Group verbal and written instruction about important numbers in your health.  Discussion of what are risk factors and how they play a role in the disease process.  Review of Cholesterol, Blood Pressure, Diabetes, and BMI and the role they play in your overall health.   Sleep Hygiene: -Provides group verbal and written instruction about how sleep can affect your health.  Define sleep hygiene, discuss sleep cycles and impact of sleep habits. Review good sleep hygiene tips.    Other: -Provides group and verbal instruction on various topics (see comments)   Knowledge Questionnaire Score: Knowledge Questionnaire Score - 12/01/17 1048      Knowledge Questionnaire Score   Pre Score  19/28 correct answers reviewed with Benjamin Frost       Core Components/Risk Factors/Patient Goals at Admission: Personal Goals and Risk Factors at Admission - 12/01/17 1247      Core Components/Risk Factors/Patient Goals on Admission    Weight Management  Yes;Weight Loss    Intervention  Weight Management: Develop a combined nutrition and exercise program designed to reach desired caloric intake, while maintaining appropriate intake of nutrient and fiber, sodium and fats, and appropriate energy expenditure required for the weight goal.;Weight Management: Provide education and appropriate resources to help participant work on and attain dietary goals.;Weight Management/Obesity: Establish reasonable short term and long term weight goals.    Admit Weight  193 lb (87.5 kg) at home, weighs every morning; 202 lb on CR's scale    Goal Weight: Short Term  188 lb (85.3 kg)    Goal Weight: Long Term  185 lb (83.9 kg)    Expected Outcomes  Short Term: Continue to assess and modify interventions until short term weight is achieved;Long Term: Adherence to nutrition and physical activity/exercise program aimed toward attainment of  established weight goal;Weight Loss: Understanding of general recommendations for a balanced deficit meal plan, which promotes 1-2 lb weight loss per week and includes a negative energy balance of [phone] kcal/d;Understanding recommendations for meals to include 15-35% energy as protein, 25-35% energy from fat, 35-60% energy from carbohydrates, less than 22m of dietary cholesterol, 20-35 gm of total fiber daily;Understanding of distribution of calorie intake throughout the day with the consumption of 4-5 meals/snacks    Heart Failure  Yes    Intervention  Provide a combined exercise and nutrition program that is supplemented with education, support and counseling about heart failure. Directed toward relieving symptoms such as shortness of breath, decreased exercise tolerance, and extremity edema.    Expected Outcomes  Improve functional capacity of life;Short term: Attendance in program 2-3 days a week with increased exercise capacity. Reported lower sodium intake. Reported increased fruit and vegetable intake. Reports medication compliance.;Short term: Daily weights  obtained and reported for increase. Utilizing diuretic protocols set by physician.;Long term: Adoption of self-care skills and reduction of barriers for early signs and symptoms recognition and intervention leading to self-care maintenance.    Hypertension  Yes    Intervention  Provide education on lifestyle modifcations including regular physical activity/exercise, weight management, moderate sodium restriction and increased consumption of fresh fruit, vegetables, and low fat dairy, alcohol moderation, and smoking cessation.;Monitor prescription use compliance.    Expected Outcomes  Short Term: Continued assessment and intervention until BP is < 140/50m HG in hypertensive participants. < 130/831mHG in hypertensive participants with diabetes, heart failure or chronic kidney disease.;Long Term: Maintenance of blood pressure at goal levels.     Lipids  Yes    Intervention  Provide education and support for participant on nutrition & aerobic/resistive exercise along with prescribed medications to achieve LDL <7098mHDL >63m23m  Expected Outcomes  Short Term: Participant states understanding of desired cholesterol values and is compliant with medications prescribed. Participant is following exercise prescription and nutrition guidelines.;Long Term: Cholesterol controlled with medications as prescribed, with individualized exercise RX and with personalized nutrition plan. Value goals: LDL < 70mg78mL > 40 mg.    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 However, his shortness of breath limits that    Intervention  Offer individual and/or small group education and counseling on adjustment to heart disease, stress management and health-related lifestyle change. Teach and support self-help strategies.;Refer participants experiencing significant psychosocial distress to appropriate mental health specialists for further evaluation and treatment. When possible, include family members and significant others in education/counseling sessions.    Expected Outcomes  Short Term: Participant demonstrates changes in health-related behavior, relaxation and other stress management skills, ability to obtain effective social support, and compliance with psychotropic medications if prescribed.;Long Term: Emotional wellbeing is indicated by absence of clinically significant psychosocial distress or social isolation.       Core Components/Risk Factors/Patient Goals Review:  Goals and Risk Factor Review    Row Name 01/01/18 0948             Core Components/Risk Factors/Patient Goals Review   Personal Goals Review  Weight Management/Obesity;Develop more efficient breathing techniques such as purse lipped breathing and diaphragmatic breathing and practicing self-pacing with  activity.;Stress;Hypertension;Lipids       Review  Benjamin Closaking all meds as directed.  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  He continues to avoid fried foods and drinks water and unsweet tea.  He still says he struggles with breathing due to COPD during strength training.  He does use PLB.  He syas he has no worries and is at peace with himself.       Expected Outcomes  Short - Benjamin Frost continue attending HT and making healthy choices  Long - Benjamin Frost maintain fitness and healthy lifestyle choices          Core Components/Risk Factors/Patient Goals at Discharge (Final Review):  Goals and Risk Factor Review - 01/01/18 0948      Core Components/Risk Factors/Patient Goals Review   Personal Goals Review  Weight Management/Obesity;Develop more efficient breathing techniques such as purse lipped breathing and diaphragmatic breathing and practicing self-pacing with activity.;Stress;Hypertension;Lipids    Review  Benjamin Closaking all meds as directed.  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  He continues to  avoid fried foods and drinks water and unsweet tea.  He still says he struggles with breathing due to COPD during strength training.  He does use PLB.  He syas he has no worries and is at peace with himself.    Expected Outcomes  Short - Benjamin Frost will continue attending HT and making healthy choices  Long - Benjamin Frost will maintain fitness and healthy lifestyle choices       ITP Comments: ITP Comments    Row Name 12/01/17 1241 12/17/17 0624 01/14/18 0541       ITP Comments  Med Review completed. Initial ITP created. Diagnosis can be found in [facility] 10/06/17  30 Day review. Continue with ITP unless directed changes per Medical Director review.   First day 3/21  30 day review. Continue with ITP unless directed changes per Medical Director        Comments:

## 2018-01-20 ENCOUNTER — Encounter: Payer: Self-pay | Admitting: *Deleted

## 2018-01-20 ENCOUNTER — Telehealth: Payer: Self-pay | Admitting: *Deleted

## 2018-01-20 DIAGNOSIS — Z952 Presence of prosthetic heart valve: Secondary | ICD-10-CM

## 2018-01-20 NOTE — Telephone Encounter (Signed)
Called to check on status of return.  He has been out with bronchitis and on second round of antibiotics and prednisone.  Today was the first day he was even able to get out of the house.  He is still coughing.  He wants to return and will when he is able.

## 2018-01-22 ENCOUNTER — Encounter: Payer: Medicare Other | Attending: Cardiovascular Disease

## 2018-01-22 DIAGNOSIS — Z952 Presence of prosthetic heart valve: Secondary | ICD-10-CM | POA: Insufficient documentation

## 2018-01-27 DIAGNOSIS — Z952 Presence of prosthetic heart valve: Secondary | ICD-10-CM | POA: Diagnosis not present

## 2018-01-27 NOTE — Progress Notes (Signed)
Daily Session Note  Patient Details  Name: Benjamin Frost MRN: [id number] Date of Birth: 11/04/1940 Referring Provider:     Cardiac Rehab from 12/01/2017 in [facility] Cardiac and Pulmonary Rehab  Referring Provider  Arida, Muhammad MD [Dr. Michael Cooper (TAVR)]      Encounter Date: 01/27/2018  Check In: Session Check In - 01/27/18 0821      Check-In   Location  [facility]-Cardiac & Pulmonary Rehab    Staff Present  Jessica Hawkins, MA, RCEP, CCRP, Exercise Physiologist;Amanda Sommer, BA, ACSM CEP, Exercise Physiologist;Susanne Bice, RN, BSN, CCRP    Supervising physician immediately available to respond to emergencies  See telemetry face sheet for immediately available ER MD    Medication changes reported      No    Fall or balance concerns reported     No    Warm-up and Cool-down  Performed on first and last piece of equipment    Resistance Training Performed  Yes    VAD Patient?  No      Pain Assessment   Currently in Pain?  No/denies    Multiple Pain Sites  No          Social History   Tobacco Use  Smoking Status Former Smoker  . Years: 0.00  . Types: Cigarettes  . Last attempt to quit: 1992  . Years since quitting: 27.3  Smokeless Tobacco Former User  . Types: Snuff  Tobacco Comment   quit 25 years ago    Goals Met:  Independence with exercise equipment Exercise tolerated well No report of cardiac concerns or symptoms Strength training completed today  Goals Unmet:  Not Applicable  Comments: Pt able to follow exercise prescription today without complaint.  Will continue to monitor for progression.    Dr. Mark Miller is Medical Director for [facility] and [facility]. 

## 2018-01-29 ENCOUNTER — Encounter: Payer: Medicare Other | Admitting: *Deleted

## 2018-01-29 DIAGNOSIS — Z952 Presence of prosthetic heart valve: Secondary | ICD-10-CM | POA: Diagnosis not present

## 2018-01-29 NOTE — Progress Notes (Signed)
Daily Session Note  Patient Details  Name: Benjamin Frost MRN: [id number] Date of Birth: 1940-12-05 Referring Provider:     Cardiac Rehab from 12/01/2017 in [facility] Cardiac and Pulmonary Rehab  Referring Provider  Kathlyn Sacramento MD [Dr. Sherren Mocha (TAVR)]      Encounter Date: 01/29/2018  Check In: Session Check In - 01/29/18 0830      Check-In   Location  [facility]-Cardiac & Pulmonary Rehab    Staff Present  Alberteen Sam, MA, RCEP, CCRP, Exercise Physiologist;Amanda Oletta Darter, BA, ACSM CEP, Exercise Physiologist;Carroll Enterkin, RN, BSN    Supervising physician immediately available to respond to emergencies  See telemetry face sheet for immediately available ER MD    Medication changes reported      No    Fall or balance concerns reported     No    Warm-up and Cool-down  Performed on first and last piece of equipment    Resistance Training Performed  Yes    VAD Patient?  No      Pain Assessment   Currently in Pain?  No/denies          Social History   Tobacco Use  Smoking Status Former Smoker  . Years: 0.00  . Types: Cigarettes  . Last attempt to quit: 1992  . Years since quitting: 27.3  Smokeless Tobacco Former Systems developer  . Types: Snuff  Tobacco Comment   quit 25 years ago    Goals Met:  Independence with exercise equipment Exercise tolerated well No report of cardiac concerns or symptoms Strength training completed today  Goals Unmet:  Not Applicable  Comments: Pt able to follow exercise prescription today without complaint.  Will continue to monitor for progression.    Dr. Emily Filbert is Medical Director for [place] and [facility].

## 2018-02-03 DIAGNOSIS — Z952 Presence of prosthetic heart valve: Secondary | ICD-10-CM

## 2018-02-03 NOTE — Progress Notes (Signed)
Daily Session Note  Patient Details  Name: Benjamin Frost MRN: [id number] Date of Birth: April 28, 1941 Referring Provider:     Cardiac Rehab from 12/01/2017 in [facility] Cardiac and Pulmonary Rehab  Referring Provider  Kathlyn Sacramento MD [Dr. Sherren Mocha (TAVR)]      Encounter Date: 02/03/2018  Check In: Session Check In - 02/03/18 0905      Check-In   Location  [facility]-Cardiac & Pulmonary Rehab    Staff Present  Heath Lark, RN, BSN, CCRP;Jessica [place], MA, RCEP, CCRP, Exercise Physiologist;Paschal Blanton Oletta Darter, [place], ACSM CEP, Exercise Physiologist    Supervising physician immediately available to respond to emergencies  See telemetry face sheet for immediately available ER MD    Medication changes reported      No    Fall or balance concerns reported     No    Warm-up and Cool-down  Performed on first and last piece of equipment    Resistance Training Performed  Yes    VAD Patient?  No      Pain Assessment   Currently in Pain?  No/denies    Multiple Pain Sites  No          Social History   Tobacco Use  Smoking Status Former Smoker  . Years: 0.00  . Types: Cigarettes  . Last attempt to quit: 1992  . Years since quitting: 27.3  Smokeless Tobacco Former Systems developer  . Types: Snuff  Tobacco Comment   quit 25 years ago    Goals Met:  Independence with exercise equipment Exercise tolerated well No report of cardiac concerns or symptoms Strength training completed today  Goals Unmet:  Not Applicable  Comments: Pt able to follow exercise prescription today without complaint.  Will continue to monitor for progression.    Dr. Emily Filbert is Medical Director for [place] and [facility].

## 2018-02-05 DIAGNOSIS — Z952 Presence of prosthetic heart valve: Secondary | ICD-10-CM

## 2018-02-05 NOTE — Progress Notes (Signed)
Daily Session Note  Patient Details  Name: Benjamin Frost MRN: [id number] Date of Birth: 12-Nov-1940 Referring Provider:     Cardiac Rehab from 12/01/2017 in [facility] Cardiac and Pulmonary Rehab  Referring Provider  Kathlyn Sacramento MD [Dr. Sherren Mocha (TAVR)]      Encounter Date: 02/05/2018  Check In: Session Check In - 02/05/18 1018      Check-In   Location  [facility]-Cardiac & Pulmonary Rehab    Staff Present  Alberteen Sam, MA, RCEP, CCRP, Exercise Physiologist;Amanda Oletta Darter, BA, ACSM CEP, Exercise Physiologist;Carroll Enterkin, RN, BSN    Supervising physician immediately available to respond to emergencies  See telemetry face sheet for immediately available ER MD    Medication changes reported      No    Fall or balance concerns reported     No    Warm-up and Cool-down  Performed on first and last piece of equipment    Resistance Training Performed  Yes    VAD Patient?  No      Pain Assessment   Currently in Pain?  No/denies          Social History   Tobacco Use  Smoking Status Former Smoker  . Years: 0.00  . Types: Cigarettes  . Last attempt to quit: 1992  . Years since quitting: 27.3  Smokeless Tobacco Former Systems developer  . Types: Snuff  Tobacco Comment   quit 25 years ago    Goals Met:  Independence with exercise equipment Exercise tolerated well No report of cardiac concerns or symptoms Strength training completed today  Goals Unmet:  Not Applicable  Comments:Pt able to follow exercise prescription today without complaint.  Will continue to monitor for progression.    Dr. Emily Filbert is Medical Director for [place] and [facility].

## 2018-02-10 DIAGNOSIS — Z952 Presence of prosthetic heart valve: Secondary | ICD-10-CM

## 2018-02-10 NOTE — Progress Notes (Signed)
Daily Session Note  Patient Details  Name: Benjamin Frost MRN: [id number] Date of Birth: 03-Feb-1941 Referring Provider:     Cardiac Rehab from 12/01/2017 in [facility] Cardiac and Pulmonary Rehab  Referring Provider  Kathlyn Sacramento MD [Dr. Sherren Mocha (TAVR)]      Encounter Date: 02/10/2018  Check In: Session Check In - 02/10/18 0831      Check-In   Location  [facility]-Cardiac & Pulmonary Rehab    Staff Present  Heath Lark, RN, BSN, CCRP;Jessica Luan Pulling, MA, RCEP, CCRP, Exercise Physiologist;Amanda Oletta Darter, [place], ACSM CEP, Exercise Physiologist    Supervising physician immediately available to respond to emergencies  See telemetry face sheet for immediately available ER MD    Medication changes reported      No    Fall or balance concerns reported     No    Warm-up and Cool-down  Performed on first and last piece of equipment    Resistance Training Performed  Yes    VAD Patient?  No      Pain Assessment   Currently in Pain?  No/denies    Multiple Pain Sites  No          Social History   Tobacco Use  Smoking Status Former Smoker  . Years: 0.00  . Types: Cigarettes  . Last attempt to quit: 1992  . Years since quitting: 27.4  Smokeless Tobacco Former Systems developer  . Types: Snuff  Tobacco Comment   quit 25 years ago    Goals Met:  Independence with exercise equipment Exercise tolerated well No report of cardiac concerns or symptoms Strength training completed today  Goals Unmet:  Not Applicable  Comments: Pt able to follow exercise prescription today without complaint.  Will continue to monitor for progression.    Dr. Emily Filbert is Medical Director for [place] and [facility].

## 2018-02-11 ENCOUNTER — Encounter: Payer: Self-pay | Admitting: *Deleted

## 2018-02-11 DIAGNOSIS — Z952 Presence of prosthetic heart valve: Secondary | ICD-10-CM

## 2018-02-11 NOTE — Progress Notes (Signed)
Cardiac Individual Treatment Plan  Patient Details  Name: Benjamin Frost MRN: [id number] Date of Birth: 1940/11/01 Referring Provider:     Cardiac Rehab from 12/01/2017 in [facility] Cardiac and Pulmonary Rehab  Referring Provider  Kathlyn Sacramento MD [Dr. Sherren Mocha (TAVR)]      Initial Encounter Date:    Cardiac Rehab from 12/01/2017 in [facility] Cardiac and Pulmonary Rehab  Date  12/01/17  Referring Provider  Kathlyn Sacramento MD [Dr. Sherren Mocha (TAVR)]      Visit Diagnosis: S/P TAVR (transcatheter aortic valve replacement)  Patient's Home Medications on Admission:  Current Outpatient Medications:  .  albuterol (PROAIR HFA) 108 (90 BASE) MCG/ACT inhaler, Inhale 2 puffs into the lungs every 6 (six) hours as needed for wheezing or shortness of breath., Disp: , Rfl:  .  amoxicillin (AMOXIL) 500 MG tablet, Take 2,000 mg (four 500 mg tablets) one hour prior to dental appointments., Disp: 4 tablet, Rfl: 6 .  aspirin 81 MG tablet, Take 81 mg by mouth daily., Disp: , Rfl:  .  atorvastatin (LIPITOR) 40 MG tablet, Take 40 mg by mouth daily., Disp: , Rfl:  .  carvedilol (COREG) 3.125 MG tablet, Take 1 tablet (3.125 mg total) by mouth 2 (two) times daily., Disp: 60 tablet, Rfl: 3 .  clopidogrel (PLAVIX) 75 MG tablet, Take 1 tablet (75 mg total) by mouth daily with breakfast., Disp: 30 tablet, Rfl: 5 .  Fluticasone-Salmeterol (ADVAIR) 250-50 MCG/DOSE AEPB, Inhale 1 puff into the lungs 2 (two) times daily., Disp: , Rfl:  .  ivabradine (CORLANOR) 5 MG TABS tablet, Take 1 tablet (5 mg total) by mouth 2 (two) times daily with a meal., Disp: 60 tablet, Rfl: 3 .  losartan (COZAAR) 25 MG tablet, Take 1 tablet (25 mg total) by mouth daily., Disp: 30 tablet, Rfl: 3 .  pantoprazole (PROTONIX) 40 MG tablet, Take 1 tablet (40 mg total) by mouth daily., Disp: 30 tablet, Rfl: 6 .  spironolactone (ALDACTONE) 25 MG tablet, Take 1 tablet (25 mg total) by mouth daily., Disp: 30 tablet, Rfl: 0 .  tamsulosin (FLOMAX)  0.4 MG CAPS capsule, Take 0.4 mg by mouth daily., Disp: , Rfl:  .  tiotropium (SPIRIVA) 18 MCG inhalation capsule, Place 18 mcg into inhaler and inhale daily., Disp: , Rfl:   Past Medical History: Past Medical History:  Diagnosis Date  . AICD (automatic cardioverter/defibrillator) present    a. St Jude, placed after VF arrest during a dobuatamine echo.   . Aortic stenosis    a. mod-severe low flow low gradient AS.   Marland Kitchen Aortic stenosis, severe    a. 11/04/17: s/p TAVR by Dr. Burt Knack and Dr. Cyndia Bent.   . Chronic systolic heart failure ([place])   . COPD (chronic obstructive pulmonary disease) ([place])   . GERD (gastroesophageal reflux disease)   . History of BPH   . History of kidney stones   . Hyperlipidemia   . Hypertension   . Left bundle branch block   . S/P TAVR (transcatheter aortic valve replacement)    a. 10/2017: s/p TAVR with an Edwards Sapien 3 THV (size 26 mm, model # 9600TFX, serial # [id number])  . Sinus tachycardia     Tobacco Use: Social History   Tobacco Use  Smoking Status Former Smoker  . Years: 0.00  . Types: Cigarettes  . Last attempt to quit: 1992  . Years since quitting: 27.4  Smokeless Tobacco Former Systems developer  . Types: Snuff  Tobacco Comment   quit 25 years ago  Labs: Recent Review Flowsheet Data    Labs for ITP Cardiac and Pulmonary Rehab Latest Ref Rng & Units 05/16/2017 05/16/2017 10/31/2017 11/04/2017 11/04/2017   Cholestrol 0 - 200 mg/dL - - - - -   LDLCALC 0 - 100 mg/dL - - - - -   HDL 40 - 60 mg/dL - - - - -   Trlycerides 0 - 200 mg/dL - - - - -   Hemoglobin A1c 4.8 - 5.6 % - - 5.4 - -   PHART 7.350 - 7.450 7.402 - 7.432 - -   PCO2ART 32.0 - 48.0 mmHg 40.4 - 41.5 - -   HCO3 20.0 - 28.0 mmol/L 25.2 26.3 27.2 - -   TCO2 22 - 32 mmol/L 26 28 - 29 29   O2SAT % 95.0 67.0 95.5 - -       Exercise Target Goals:    Exercise Program Goal: Individual exercise prescription set using results from initial 6 min walk test and THRR while considering  patient's  activity barriers and safety.   Exercise Prescription Goal: Initial exercise prescription builds to 30-45 minutes a day of aerobic activity, 2-3 days per week.  Home exercise guidelines will be given to patient during program as part of exercise prescription that the participant will acknowledge.  Activity Barriers & Risk Stratification: Activity Barriers & Cardiac Risk Stratification - 12/01/17 1254      Activity Barriers & Cardiac Risk Stratification   Activity Barriers  Arthritis;Shortness of Breath    Cardiac Risk Stratification  Moderate       6 Minute Walk: 6 Minute Walk    Row Name 12/01/17 1527         6 Minute Walk   Phase  Initial     Distance  1035 feet     Walk Time  6 minutes     # of Rest Breaks  0     MPH  1.96     METS  2.06     RPE  13     Perceived Dyspnea   3     VO2 Peak  7.2     Symptoms  Yes (comment)     Comments  SOB     Resting HR  73 bpm     Resting BP  144/74     Resting Oxygen Saturation   95 %     Exercise Oxygen Saturation  during 6 min walk  96 %     Max Ex. HR  98 bpm     Max Ex. BP  156/74     2 Minute Post BP  146/72       Interval HR   1 Minute HR  97     2 Minute HR  97     3 Minute HR  96     4 Minute HR  97     5 Minute HR  98     6 Minute HR  96     2 Minute Post HR  75     Interval Heart Rate?  Yes       Interval Oxygen   Interval Oxygen?  Yes     Baseline Oxygen Saturation %  95 %     1 Minute Oxygen Saturation %  92 %     1 Minute Liters of Oxygen  0 L Room Air     2 Minute Oxygen Saturation %  90 %     2 Minute Liters  of Oxygen  0 L     3 Minute Oxygen Saturation %  91 %     3 Minute Liters of Oxygen  0 L     4 Minute Oxygen Saturation %  91 %     4 Minute Liters of Oxygen  0 L     5 Minute Oxygen Saturation %  91 %     5 Minute Liters of Oxygen  0 L     6 Minute Oxygen Saturation %  91 %     6 Minute Liters of Oxygen  0 L     2 Minute Post Oxygen Saturation %  96 %     2 Minute Post Liters of Oxygen  0 L         Oxygen Initial Assessment:   Oxygen Re-Evaluation:   Oxygen Discharge (Final Oxygen Re-Evaluation):   Initial Exercise Prescription: Initial Exercise Prescription - 12/01/17 1500      Date of Initial Exercise RX and Referring Provider   Date  12/01/17    Referring Provider  Kathlyn Sacramento MD Dr. Sherren Mocha (TAVR)      Treadmill   MPH  1.4    Grade  0.5    Minutes  15    METs  2.17      REL-XR   Level  1    Speed  50    Minutes  15    METs  2      T5 Nustep   Level  1    SPM  80    Minutes  15    METs  2      Prescription Details   Frequency (times per week)  2    Duration  Progress to 30 minutes of continuous aerobic without signs/symptoms of physical distress      Intensity   THRR 40-80% of Max Heartrate  101-128    Ratings of Perceived Exertion  11-13    Perceived Dyspnea  0-4      Progression   Progression  Continue to progress workloads to maintain intensity without signs/symptoms of physical distress.      Resistance Training   Training Prescription  Yes    Weight  3 lbs    Reps  10-15       Perform Capillary Blood Glucose checks as needed.  Exercise Prescription Changes: Exercise Prescription Changes    Row Name 12/01/17 1300 12/23/17 1500 01/06/18 1500 01/20/18 1200 02/03/18 1100     Response to Exercise   Blood Pressure (Admit)  144/74  128/60  116/74  112/70  104/68   Blood Pressure (Exercise)  156/74  108/62  114/56  134/80  148/80   Blood Pressure (Exit)  146/72  124/70  132/74  116/70  128/62   Heart Rate (Admit)  73 bpm  82 bpm  77 bpm  84 bpm  86 bpm   Heart Rate (Exercise)  98 bpm  107 bpm  115 bpm  108 bpm  105 bpm   Heart Rate (Exit)  75 bpm  81 bpm  68 bpm  70 bpm  83 bpm   Oxygen Saturation (Admit)  95 %  -  -  -  -   Oxygen Saturation (Exercise)  90 %  -  -  -  -   Rating of Perceived Exertion (Exercise)  _0 Perceived Dyspnea (Exercise)  3  -  -  -  -  Symptoms  SOB  none  none  none  none    Comments  walk test results  -  -  -  -   Duration  -  Continue with 30 min of aerobic exercise without signs/symptoms of physical distress.  Continue with 30 min of aerobic exercise without signs/symptoms of physical distress.  Continue with 30 min of aerobic exercise without signs/symptoms of physical distress.  Continue with 30 min of aerobic exercise without signs/symptoms of physical distress.   Intensity  -  THRR unchanged  THRR unchanged  THRR unchanged  THRR unchanged     Progression   Progression  -  Continue to progress workloads to maintain intensity without signs/symptoms of physical distress.  Continue to progress workloads to maintain intensity without signs/symptoms of physical distress.  Continue to progress workloads to maintain intensity without signs/symptoms of physical distress.  Continue to progress workloads to maintain intensity without signs/symptoms of physical distress.   Average METs  -  2.09  2.32  2.39  2.93     Resistance Training   Training Prescription  -  Yes  Yes  Yes  Yes   Weight  -  3 lbs  3 lbs  3 lbs  4 lbs   Reps  -  10-15  10-15  10-15  10-15     Interval Training   Interval Training  -  -  -  No  No     Treadmill   MPH  -  1.4  1.4  1.4  1.8   Grade  -  0.5  0.5  0.5  0.5   Minutes  -  _0 METs  -  2.17  2.17  2.17  2.5     REL-XR   Level  -  _1 Minutes  -  _2 METs  -  2  2.9  3  4.4     T5 Nustep   Level  -  _3 Minutes  -  _4 METs  -  2  1.9  1.9  1.9     Home Exercise Plan   Plans to continue exercise at  -  [employer] (comment) walking and the [employer] (comment) walking and the [employer] (comment) walking and the [employer] (comment) walking and the [employer]  -  Add 1 additional day to program exercise sessions.  Add 1 additional day to program exercise sessions.  Add 1 additional day to program exercise sessions.   Add 1 additional day to program exercise sessions.   Initial Home Exercises Provided  -  12/23/17  12/23/17  12/23/17  12/23/17      Exercise Comments: Exercise Comments    Row Name 12/11/17 0818 12/18/17 0940         Exercise Comments  First full day of exercise!  Patient was oriented to gym and equipment including functions, settings, policies, and procedures.  Patient's individual exercise prescription and treatment plan were reviewed.  All starting workloads were established based on the results of the 6 minute walk test done at initial orientation visit.  The plan for exercise progression was also introduced and progression will be customized based on patient's performance and goals.  Reviewed  home exercise with pt today.  Pt plans to walk and go to the [place] for exercise.  Reviewed THR, pulse, RPE, sign and symptoms, NTG use, and when to call 911 or MD.  Also discussed weather considerations and indoor options.  Pt voiced understanding.         Exercise Goals and Review: Exercise Goals    Row Name 12/01/17 1531             Exercise Goals   Increase Physical Activity  Yes       Intervention  Provide advice, education, support and counseling about physical activity/exercise needs.;Develop an individualized exercise prescription for aerobic and resistive training based on initial evaluation findings, risk stratification, comorbidities and participant's personal goals.       Expected Outcomes  Short Term: Attend rehab on a regular basis to increase amount of physical activity.;Long Term: Add in home exercise to make exercise part of routine and to increase amount of physical activity.;Long Term: Exercising regularly at least 3-5 days a week.       Increase Strength and Stamina  Yes       Intervention  Provide advice, education, support and counseling about physical activity/exercise needs.;Develop an individualized exercise prescription for aerobic and resistive training based on initial  evaluation findings, risk stratification, comorbidities and participant's personal goals.       Expected Outcomes  Short Term: Increase workloads from initial exercise prescription for resistance, speed, and METs.;Short Term: Perform resistance training exercises routinely during rehab and add in resistance training at home;Long Term: Improve cardiorespiratory fitness, muscular endurance and strength as measured by increased METs and functional capacity (6MWT)       Able to understand and use rate of perceived exertion (RPE) scale  Yes       Intervention  Provide education and explanation on how to use RPE scale       Expected Outcomes  Short Term: Able to use RPE daily in rehab to express subjective intensity level;Long Term:  Able to use RPE to guide intensity level when exercising independently       Able to understand and use Dyspnea scale  Yes       Intervention  Provide education and explanation on how to use Dyspnea scale       Expected Outcomes  Short Term: Able to use Dyspnea scale daily in rehab to express subjective sense of shortness of breath during exertion;Long Term: Able to use Dyspnea scale to guide intensity level when exercising independently       Knowledge and understanding of Target Heart Rate Range (THRR)  Yes       Intervention  Provide education and explanation of THRR including how the numbers were predicted and where they are located for reference       Expected Outcomes  Short Term: Able to state/look up THRR;Long Term: Able to use THRR to govern intensity when exercising independently;Short Term: Able to use daily as guideline for intensity in rehab       Able to check pulse independently  Yes       Intervention  Provide education and demonstration on how to check pulse in carotid and radial arteries.;Review the importance of being able to check your own pulse for safety during independent exercise       Expected Outcomes  Short Term: Able to explain why pulse checking is  important during independent exercise;Long Term: Able to check pulse independently and accurately  Understanding of Exercise Prescription  Yes       Intervention  Provide education, explanation, and written materials on patient's individual exercise prescription       Expected Outcomes  Short Term: Able to explain program exercise prescription;Long Term: Able to explain home exercise prescription to exercise independently          Exercise Goals Re-Evaluation : Exercise Goals Re-Evaluation    Row Name 12/11/17 0818 12/18/17 0940 12/23/17 1458 01/06/18 1512 01/20/18 1222     Exercise Goal Re-Evaluation   Exercise Goals Review  Increase Physical Activity;Increase Strength and Stamina;Able to understand and use rate of perceived exertion (RPE) scale;Knowledge and understanding of Target Heart Rate Range (THRR)  Increase Physical Activity;Increase Strength and Stamina;Able to understand and use rate of perceived exertion (RPE) scale;Knowledge and understanding of Target Heart Rate Range (THRR);Able to check pulse independently;Understanding of Exercise Prescription  Increase Physical Activity;Increase Strength and Stamina;Understanding of Exercise Prescription  Increase Physical Activity;Increase Strength and Stamina;Understanding of Exercise Prescription  Increase Physical Activity;Increase Strength and Stamina;Understanding of Exercise Prescription   Comments  Reviewed RPE scale, THR and program prescription with pt today.  Pt voiced understanding and was given a copy of goals to take home.   Reviewed home exercise with pt today.  Pt plans to walk and go to the [place] for exercise.  Reviewed THR, pulse, RPE, sign and symptoms, NTG use, and when to call 911 or MD.  Also discussed weather considerations and indoor options.  Pt voiced understanding.  Eduard Clos is off to a good start in rehab.  He has already started to mesh with his classmates.  He should be ready to start to increase his workloads.  We  will continue to monitor his progression.   Eduard Clos has been doing well in rehab.  He is now on the XR at level 2.  We will continue to monitor his progression.  Eduard Clos has only attended once since last review.  As a result, we have not seen much progress.    Expected Outcomes  Short: Use RPE daily to regulate intensity.  Long: Follow program prescription in THR.  Short - Pt will add 1 day of exercise in addition to program sessions Long - Pt will exercise on his own  Short: Begin to increase workloads.  Long: Continue to add in 1 extra day of exercise at home.   Short: Move up workloads on T5 NuStep.  Long: Continue to exercise more at home.   Short: Atted consistently.  Long: Continue to build strength and stamina.    [place] Name 02/03/18 1113             Exercise Goal Re-Evaluation   Exercise Goals Review  Increase Physical Activity;Increase Strength and Stamina;Understanding of Exercise Prescription       Comments  Eduard Clos has been doing well in rehab.  He is up to 1.8 mph on the treadmill.  We will continue to monitor his progression,.        Expected Outcomes  Short: Continue to increase workloads.  Long: Continue to work on increasing activity.           Discharge Exercise Prescription (Final Exercise Prescription Changes): Exercise Prescription Changes - 02/03/18 1100      Response to Exercise   Blood Pressure (Admit)  104/68    Blood Pressure (Exercise)  148/80    Blood Pressure (Exit)  128/62    Heart Rate (Admit)  86 bpm    Heart Rate (Exercise)  105 bpm    Heart Rate (Exit)  83 bpm    Rating of Perceived Exertion (Exercise)  12    Symptoms  none    Duration  Continue with 30 min of aerobic exercise without signs/symptoms of physical distress.    Intensity  THRR unchanged      Progression   Progression  Continue to progress workloads to maintain intensity without signs/symptoms of physical distress.    Average METs  2.93      Resistance Training   Training Prescription   Yes    Weight  4 lbs    Reps  10-15      Interval Training   Interval Training  No      Treadmill   MPH  1.8    Grade  0.5    Minutes  15    METs  2.5      REL-XR   Level  3    Minutes  15    METs  4.4      T5 Nustep   Level  2    Minutes  15    METs  1.9      Home Exercise Plan   Plans to continue exercise at  [employer] (comment) walking and the [place]    Frequency  Add 1 additional day to program exercise sessions.    Initial Home Exercises Provided  12/23/17       Nutrition:  Target Goals: Understanding of nutrition guidelines, daily intake of sodium <1546m, cholesterol <207m calories 30% from fat and 7% or less from saturated fats, daily to have 5 or more servings of fruits and vegetables.  Biometrics: Pre Biometrics - 12/01/17 1532      Pre Biometrics   Height  5' 9.1" (1.755 m)    Weight  202 lb 4.8 oz (91.8 kg)    Waist Circumference  39.5 inches    Hip Circumference  44 inches    Waist to Hip Ratio  0.9 %    BMI (Calculated)  29.79    Single Leg Stand  4.8 seconds        Nutrition Therapy Plan and Nutrition Goals: Nutrition Therapy & Goals - 01/01/18 1027      Nutrition Therapy   Diet  TLC    Drug/Food Interactions  Statins/Certain Fruits    Protein (specify units)  8-9oz    Fiber  30 grams    Whole Grain Foods  3 servings    Saturated Fats  14 max. grams    Fruits and Vegetables  6 servings/day 8 ideal    Sodium  2000 grams 1500 ideal      Personal Nutrition Goals   Nutrition Goal  Increase intake of fruits consumed daily    Personal Goal #2  Consume sweets / desserts in moderation    Comments  he currently watches his sodium intake, eats a variety of vegetables and lean meats, and eats out 1x/month      Intervention Plan   Intervention  Prescribe, educate and counsel regarding individualized specific dietary modifications aiming towards targeted core components such as weight, hypertension, lipid management, diabetes, heart  failure and other comorbidities.    Expected Outcomes  Short Term Goal: Understand basic principles of dietary content, such as calories, fat, sodium, cholesterol and nutrients.;Short Term Goal: A plan has been developed with personal nutrition goals set during dietitian appointment.;Long Term Goal: Adherence to prescribed nutrition plan.       Nutrition Assessments: Nutrition Assessments -  12/01/17 1249      MEDFICTS Scores   Pre Score  49       Nutrition Goals Re-Evaluation: Nutrition Goals Re-Evaluation    Row Name 12/22/17 0927 01/27/18 0909           Goals   Nutrition Goal  Decrease consumption of fried foods. Choose baked, broiled or grilled instead. When ordering out, opt for vegetables or a baked potato as a side rather than french fries  -      Comment  Patient currently eats fried foods regularly  Pt states he only has fried food once per week, eats a lot of bananas.  They do use 2% milk.  They eat out only once per month.        Expected Outcome  Patient will choose non-fried meats and sides at least half the time  Short - Pt will ask wife buy more fruits and veggies and  if she buys them he will eat them  Long - maintain other heathy choices such as reduced sodium and not eating fried foods        Personal Goal #2 Re-Evaluation   Personal Goal #2  Choose lower-fat condiments, dressings and dairy products. Look for labeling such as "part skim," "reduced fat," or "light" patient currently chooses full fat condiments, dressings and dairy  -         Nutrition Goals Discharge (Final Nutrition Goals Re-Evaluation): Nutrition Goals Re-Evaluation - 01/27/18 0909      Goals   Comment  Pt states he only has fried food once per week, eats a lot of bananas.  They do use 2% milk.  They eat out only once per month.      Expected Outcome  Short - Pt will ask wife buy more fruits and veggies and  if she buys them he will eat them  Long - maintain other heathy choices such as reduced  sodium and not eating fried foods       Psychosocial: Target Goals: Acknowledge presence or absence of significant depression and/or stress, maximize coping skills, provide positive support system. Participant is able to verbalize types and ability to use techniques and skills needed for reducing stress and depression.   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 However, his shortness of breath limits that      Family Dynamics   Good Support System?  Yes wife, children      Barriers   Psychosocial barriers to participate in program  There are no identifiable barriers or psychosocial needs.;Psychosocial barriers identified (see note);The patient should benefit from training in stress management and relaxation.      Screening Interventions   Interventions  Encouraged to exercise;Provide feedback about the scores to participant;Program counselor consult;To provide support and resources with identified psychosocial needs    Expected Outcomes  Short Term goal: Utilizing psychosocial counselor, staff and physician to assist with identification of specific Stressors or current issues interfering with healing process. Setting desired goal for each stressor or current issue identified.;Long Term Goal: Stressors or current issues are controlled or eliminated.;Short Term goal: Identification and review with participant of any Quality of Life  or Depression concerns found by scoring the questionnaire.;Long Term goal: The participant improves quality of Life and PHQ9 Scores as seen by post scores and/or verbalization of changes       Quality of Life Scores:  Quality of Life - 12/01/17 1049       Quality of Life Scores   Health/Function Pre  24.7 %    Socioeconomic Pre  30 %    Psych/Spiritual Pre  29.14 %    Family Pre  30 %    GLOBAL Pre  27.49 %      Scores of 19 and below usually indicate a poorer quality of life in these areas.  A difference of  2-3 points is a clinically meaningful difference.  A difference of 2-3 points in the total score of the Quality of Life Index has been associated with significant improvement in overall quality of life, self-image, physical symptoms, and general health in studies assessing change in quality of life.  PHQ-9: Recent Review Flowsheet Data    Depression screen [facility] 2/9 12/01/2017   Decreased Interest 0   Down, Depressed, Hopeless 0   PHQ - 2 Score 0   Altered sleeping 0   Tired, decreased energy 0   Change in appetite 0   Feeling bad or failure about yourself  0   Trouble concentrating 0   Moving slowly or fidgety/restless 0   Suicidal thoughts 0   PHQ-9 Score 0     Interpretation of Total Score  Total Score Depression Severity:  1-4 = Minimal depression, 5-9 = Mild depression, 10-14 = Moderate depression, 15-19 = Moderately severe depression, 20-27 = Severe depression   Psychosocial Evaluation and Intervention: Psychosocial Evaluation - 12/16/17 0951      Psychosocial Evaluation & Interventions   Interventions  Encouraged to exercise with the program and follow exercise prescription;Stress management education    Comments  Counselor met with Mr. Doten [facility]) today for initial psychosocial evaluation.  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  Eduard Clos has a strong support system with a spouse of 32 years; (4) adult children who live locally; and he is actively involved in his local church.  Eduard Clos reports having hearing problems and COPD in addition to his cardiac health concerns.  He sleeps well and has a good appetite.  Eduard Clos denies a history of depression or anxiety or any current  symptoms and is typically in a positive mood.  Eduard Clos states he has minimal stress in his life other than his concern on occasion about his adult children.  He has goals to breathe better and increase his stamina while in this program.  Staff will follow with Charlie.    Expected Outcomes  Short: Eduard Clos will attend the educational workshop on Stress and anxiety to cope better with his concerns for his family.   Long Term:   Eduard Clos will learn to exercise more consistently for stress and his health goals.    Continue Psychosocial Services   Follow up required by staff       Psychosocial Re-Evaluation:   Psychosocial Discharge (Final Psychosocial Re-Evaluation):   Vocational Rehabilitation: Provide vocational rehab assistance to qualifying candidates.   Vocational Rehab Evaluation & Intervention: Vocational Rehab - 12/01/17 1252      Initial Vocational Rehab Evaluation & Intervention   Assessment shows need for Vocational Rehabilitation  No       Education: Education Goals: Education classes will be provided on a variety of  topics geared toward better understanding of heart health and risk factor modification. Participant will state understanding/return demonstration of topics presented as noted by education test scores.  Learning Barriers/Preferences: Learning Barriers/Preferences - 12/01/17 1251      Learning Barriers/Preferences   Learning Barriers  Hearing;Sight;Exercise Concerns    Learning Preferences  Individual Instruction       Education Topics:  AED/CPR: - Group verbal and written instruction with the use of models to demonstrate the basic use of the AED with the basic ABC's of resuscitation.   Cardiac Rehab from 02/10/2018 in [facility] Cardiac and Pulmonary Rehab  Date  12/25/17  Educator  CE  Instruction Review Code  1- Verbalizes Understanding      General Nutrition Guidelines/Fats and Fiber: -Group instruction provided by verbal, written material, models and  posters to present the general guidelines for heart healthy nutrition. Gives an explanation and review of dietary fats and fiber.   Cardiac Rehab from 02/10/2018 in [facility] Cardiac and Pulmonary Rehab  Date  02/10/18  Educator  PI  Instruction Review Code  1- Verbalizes Understanding      Controlling Sodium/Reading Food Labels: -Group verbal and written material supporting the discussion of sodium use in heart healthy nutrition. Review and explanation with models, verbal and written materials for utilization of the food label.   Cardiac Rehab from 02/10/2018 in [facility] Cardiac and Pulmonary Rehab  Date  12/23/17  Educator  PI  Instruction Review Code  1- Verbalizes Understanding      Exercise Physiology & General Exercise Guidelines: - Group verbal and written instruction with models to review the exercise physiology of the cardiovascular system and associated critical values. Provides general exercise guidelines with specific guidelines to those with heart or lung disease.    Cardiac Rehab from 02/10/2018 in [facility] Cardiac and Pulmonary Rehab  Date  01/01/18  Educator  [facility]  Instruction Review Code  1- Verbalizes Understanding      Aerobic Exercise & Resistance Training: - Gives group verbal and written instruction on the various components of exercise. Focuses on aerobic and resistive training programs and the benefits of this training and how to safely progress through these programs..   Cardiac Rehab from 02/10/2018 in [facility] Cardiac and Pulmonary Rehab  Date  01/06/18  Educator  AS  Instruction Review Code  1- Verbalizes Understanding      Flexibility, Balance, Mind/Body Relaxation: Provides group verbal/written instruction on the benefits of flexibility and balance training, including mind/body exercise modes such as yoga, pilates and tai chi.  Demonstration and skill practice provided.   Stress and Anxiety: - Provides group verbal and written instruction about the health risks of  elevated stress and causes of high stress.  Discuss the correlation between heart/lung disease and anxiety and treatment options. Review healthy ways to manage with stress and anxiety.   Depression: - Provides group verbal and written instruction on the correlation between heart/lung disease and depressed mood, treatment options, and the stigmas associated with seeking treatment.   Cardiac Rehab from 02/10/2018 in [facility] Cardiac and Pulmonary Rehab  Date  01/08/18  Educator  [facility]  Instruction Review Code  1- Verbalizes Understanding      Anatomy & Physiology of the Heart: - Group verbal and written instruction and models provide basic cardiac anatomy and physiology, with the coronary electrical and arterial systems. Review of Valvular disease and Heart Failure   Cardiac Rehab from 02/10/2018 in [facility] Cardiac and Pulmonary Rehab  Date  12/11/17  Educator  CE  Instruction Review Code  1- Verbalizes Understanding      Cardiac Procedures: - Group verbal and written instruction to review commonly prescribed medications for heart disease. Reviews the medication, class of the drug, and side effects. Includes the steps to properly store meds and maintain the prescription regimen. (beta blockers and nitrates)   Cardiac Rehab from 02/10/2018 in [facility] Cardiac and Pulmonary Rehab  Date  02/05/18  Educator  CE  Instruction Review Code  1- Verbalizes Understanding      Cardiac Medications I: - Group verbal and written instruction to review commonly prescribed medications for heart disease. Reviews the medication, class of the drug, and side effects. Includes the steps to properly store meds and maintain the prescription regimen.   Cardiac Rehab from 02/10/2018 in [facility] Cardiac and Pulmonary Rehab  Date  01/27/18  Educator  SB  Instruction Review Code  1- Verbalizes Understanding      Cardiac Medications II: -Group verbal and written instruction to review commonly prescribed medications for heart  disease. Reviews the medication, class of the drug, and side effects. (all other drug classes)    Go Sex-Intimacy & Heart Disease, Get SMART - Goal Setting: - Group verbal and written instruction through game format to discuss heart disease and the return to sexual intimacy. Provides group verbal and written material to discuss and apply goal setting through the application of the S.M.A.R.T. Method.   Cardiac Rehab from 02/10/2018 in [facility] Cardiac and Pulmonary Rehab  Date  02/05/18  Educator  CE  Instruction Review Code  1- Verbalizes Understanding      Other Matters of the Heart: - Provides group verbal, written materials and models to describe Stable Angina and Peripheral Artery. Includes description of the disease process and treatment options available to the cardiac patient.   Exercise & Equipment Safety: - Individual verbal instruction and demonstration of equipment use and safety with use of the equipment.   Cardiac Rehab from 02/10/2018 in [facility] Cardiac and Pulmonary Rehab  Date  12/01/17  Educator  [facility]  Instruction Review Code  1- Verbalizes Understanding      Infection Prevention: - Provides verbal and written material to individual with discussion of infection control including proper hand washing and proper equipment cleaning during exercise session.   Cardiac Rehab from 02/10/2018 in [facility] Cardiac and Pulmonary Rehab  Date  12/01/17  Educator  [facility]  Instruction Review Code  1- Verbalizes Understanding      Falls Prevention: - Provides verbal and written material to individual with discussion of falls prevention and safety.   Cardiac Rehab from 02/10/2018 in [facility] Cardiac and Pulmonary Rehab  Date  12/01/17  Educator  [facility]  Instruction Review Code  1- Verbalizes Understanding      Diabetes: - Individual verbal and written instruction to review signs/symptoms of diabetes, desired ranges of glucose level fasting, after meals and with exercise. Acknowledge that pre and post  exercise glucose checks will be done for 3 sessions at entry of program.   Know Your Numbers and Risk Factors: -Group verbal and written instruction about important numbers in your health.  Discussion of what are risk factors and how they play a role in the disease process.  Review of Cholesterol, Blood Pressure, Diabetes, and BMI and the role they play in your overall health.   Sleep Hygiene: -Provides group verbal and written instruction about how sleep can affect your health.  Define sleep hygiene, discuss sleep cycles and impact of sleep habits. Review good sleep  hygiene tips.    Cardiac Rehab from 02/10/2018 in [facility] Cardiac and Pulmonary Rehab  Date  02/03/18  Educator  [facility]  Instruction Review Code  1- Verbalizes Understanding      Other: -Provides group and verbal instruction on various topics (see comments)   Knowledge Questionnaire Score: Knowledge Questionnaire Score - 12/01/17 1048      Knowledge Questionnaire Score   Pre Score  19/28 correct answers reviewed with Juanda Crumble       Core Components/Risk Factors/Patient Goals at Admission: Personal Goals and Risk Factors at Admission - 12/01/17 1247      Core Components/Risk Factors/Patient Goals on Admission    Weight Management  Yes;Weight Loss    Intervention  Weight Management: Develop a combined nutrition and exercise program designed to reach desired caloric intake, while maintaining appropriate intake of nutrient and fiber, sodium and fats, and appropriate energy expenditure required for the weight goal.;Weight Management: Provide education and appropriate resources to help participant work on and attain dietary goals.;Weight Management/Obesity: Establish reasonable short term and long term weight goals.    Admit Weight  193 lb (87.5 kg) at home, weighs every morning; 202 lb on CR's scale    Goal Weight: Short Term  188 lb (85.3 kg)    Goal Weight: Long Term  185 lb (83.9 kg)    Expected Outcomes  Short Term: Continue to  assess and modify interventions until short term weight is achieved;Long Term: Adherence to nutrition and physical activity/exercise program aimed toward attainment of established weight goal;Weight Loss: Understanding of general recommendations for a balanced deficit meal plan, which promotes 1-2 lb weight loss per week and includes a negative energy balance of [phone] kcal/d;Understanding recommendations for meals to include 15-35% energy as protein, 25-35% energy from fat, 35-60% energy from carbohydrates, less than 21m of dietary cholesterol, 20-35 gm of total fiber daily;Understanding of distribution of calorie intake throughout the day with the consumption of 4-5 meals/snacks    Heart Failure  Yes    Intervention  Provide a combined exercise and nutrition program that is supplemented with education, support and counseling about heart failure. Directed toward relieving symptoms such as shortness of breath, decreased exercise tolerance, and extremity edema.    Expected Outcomes  Improve functional capacity of life;Short term: Attendance in program 2-3 days a week with increased exercise capacity. Reported lower sodium intake. Reported increased fruit and vegetable intake. Reports medication compliance.;Short term: Daily weights obtained and reported for increase. Utilizing diuretic protocols set by physician.;Long term: Adoption of self-care skills and reduction of barriers for early signs and symptoms recognition and intervention leading to self-care maintenance.    Hypertension  Yes    Intervention  Provide education on lifestyle modifcations including regular physical activity/exercise, weight management, moderate sodium restriction and increased consumption of fresh fruit, vegetables, and low fat dairy, alcohol moderation, and smoking cessation.;Monitor prescription use compliance.    Expected Outcomes  Short Term: Continued assessment and intervention until BP is < 140/964mHG in hypertensive  participants. < 130/8052mG in hypertensive participants with diabetes, heart failure or chronic kidney disease.;Long Term: Maintenance of blood pressure at goal levels.    Lipids  Yes    Intervention  Provide education and support for participant on nutrition & aerobic/resistive exercise along with prescribed medications to achieve LDL <53m64mDL >40mg7m Expected Outcomes  Short Term: Participant states understanding of desired cholesterol values and is compliant with medications prescribed. Participant is following exercise prescription and nutrition guidelines.;Long Term:  Cholesterol controlled with medications as prescribed, with individualized exercise RX and with personalized nutrition plan. Value goals: LDL < 70m, HDL > 40 mg.    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 However, his shortness of breath limits that    Intervention  Offer individual and/or small group education and counseling on adjustment to heart disease, stress management and health-related lifestyle change. Teach and support self-help strategies.;Refer participants experiencing significant psychosocial distress to appropriate mental health specialists for further evaluation and treatment. When possible, include family members and significant others in education/counseling sessions.    Expected Outcomes  Short Term: Participant demonstrates changes in health-related behavior, relaxation and other stress management skills, ability to obtain effective social support, and compliance with psychotropic medications if prescribed.;Long Term: Emotional wellbeing is indicated by absence of clinically significant psychosocial distress or social isolation.       Core Components/Risk Factors/Patient Goals Review:  Goals and Risk Factor Review    Row Name 01/01/18 0948 01/27/18 0913           Core Components/Risk Factors/Patient Goals Review   Personal Goals  Review  Weight Management/Obesity;Develop more efficient breathing techniques such as purse lipped breathing and diaphragmatic breathing and practicing self-pacing with activity.;Stress;Hypertension;Lipids  Lipids;Hypertension      Review  CEduard Closis taking all meds as directed.  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  He continues to avoid fried foods and drinks water and unsweet tea.  He still says he struggles with breathing due to COPD during strength training.  He does use PLB.  He syas he has no worries and is at peace with himself.  Pt taking meds as directed.  BP has been good at home and in HT class.  He checks 02 at home as well.  Has been on prednisone and antibiotics past 2 weeks for bronchitis.      Expected Outcomes  Short - CEduard Closwill continue attending HT and making healthy choices  Long - CEduard Closwill maintain fitness and healthy lifestyle choices  Short - Pt will continue meds and exercise to maintain health and fitness Long - Pt will maintain exercise and healthy habits long term         Core Components/Risk Factors/Patient Goals at Discharge (Final Review):  Goals and Risk Factor Review - 01/27/18 0913      Core Components/Risk Factors/Patient Goals Review   Personal Goals Review  Lipids;Hypertension    Review  Pt taking meds as directed.  BP has been good at home and in HT class.  He checks 02 at home as well.  Has been on prednisone and antibiotics past 2 weeks for bronchitis.    Expected Outcomes  Short - Pt will continue meds and exercise to maintain health and fitness Long - Pt will maintain exercise and healthy habits long term       ITP Comments: ITP Comments    Row Name 12/01/17 1241 12/17/17 0624 01/14/18 0541 01/20/18 1622 02/11/18 0556   ITP Comments  Med Review completed. Initial ITP created. [facility]  30 Day review. Continue with ITP unless directed changes per Medical Director review.   First day 3/21  30 day review.  Continue with ITP unless directed changes per Medical Director  Called to check on status of return.  He has been out with bronchitis and on second round of  antibiotics and prednisone.  Today was the first day he was even able to get out of the house.  He is still coughing.  He wants to return and will when he is able.    30 day review. Continue with ITP unless directed changes per Medical Director      Comments:

## 2018-02-12 ENCOUNTER — Encounter: Payer: Medicare Other | Admitting: *Deleted

## 2018-02-12 DIAGNOSIS — Z952 Presence of prosthetic heart valve: Secondary | ICD-10-CM

## 2018-02-12 NOTE — Progress Notes (Signed)
Daily Session Note  Patient Details  Name: Benjamin Frost MRN: [id number] Date of Birth: [birth year] Referring Provider:     Cardiac Rehab from 12/01/2017 in [name] Cardiac and Pulmonary Rehab  Referring Provider  Kathlyn Sacramento MD [Dr. Sherren Mocha (TAVR)]      Encounter Date: 02/12/2018  Check In: Session Check In - 02/12/18 0840      Check-In   Location  [facility]-Cardiac & Pulmonary Rehab    Staff Present  Gerlene Burdock, RN, BSN;Meredith Sherryll Burger, RN BSN;Jami Bogdanski Luan Pulling, MA, RCEP, CCRP, Exercise Physiologist    Supervising physician immediately available to respond to emergencies  See telemetry face sheet for immediately available ER MD    Medication changes reported      No    Fall or balance concerns reported     No    Warm-up and Cool-down  Performed on first and last piece of equipment    Resistance Training Performed  Yes    VAD Patient?  No      Pain Assessment   Currently in Pain?  No/denies          Social History   Tobacco Use  Smoking Status Former Smoker  . Years: 0.00  . Types: Cigarettes  . Last attempt to quit: 1992  . Years since quitting: 27.4  Smokeless Tobacco Former Systems developer  . Types: Snuff  Tobacco Comment   quit 25 years ago    Goals Met:  Independence with exercise equipment Exercise tolerated well No report of cardiac concerns or symptoms Strength training completed today  Goals Unmet:  Not Applicable  Comments: Pt able to follow exercise prescription today without complaint.  Will continue to monitor for progression. [place] Name 12/01/17 1527 02/12/18 1020       6 Minute Walk   Phase  Initial  Discharge    Distance  1035 feet  1268 feet    Distance % Change  -  22.5 %    Distance Feet Change  -  233 ft    Walk Time  6 minutes  6 minutes    # of Rest Breaks  0  0    MPH  1.96  2.4    METS  2.06  2.29    RPE  13  13    Perceived Dyspnea   3  3    VO2 Peak  7.2  8.02    Symptoms  Yes (comment)  Yes (comment)    Comments  SOB  SOB    Resting HR  73 bpm  64 bpm    Resting BP  144/74  130/70    Resting Oxygen Saturation   95 %  -    Exercise Oxygen Saturation  during 6 min walk  96 %  -    Max Ex. HR  98 bpm  96 bpm    Max Ex. BP  156/74  134/74    2 Minute Post BP  146/72  -      Interval HR   1 Minute HR  97  -    2 Minute HR  97  -    3 Minute HR  96  -    4 Minute HR  97  -    5 Minute HR  98  -    6 Minute HR  96  -    2 Minute Post HR  75  -    Interval Heart Rate?  Yes  -  Interval Oxygen   Interval Oxygen?  Yes  -    Baseline Oxygen Saturation %  95 %  -    1 Minute Oxygen Saturation %  92 %  -    1 Minute Liters of Oxygen  0 L Room Air  -    2 Minute Oxygen Saturation %  90 %  -    2 Minute Liters of Oxygen  0 L  -    3 Minute Oxygen Saturation %  91 %  -    3 Minute Liters of Oxygen  0 L  -    4 Minute Oxygen Saturation %  91 %  -    4 Minute Liters of Oxygen  0 L  -    5 Minute Oxygen Saturation %  91 %  -    5 Minute Liters of Oxygen  0 L  -    6 Minute Oxygen Saturation %  91 %  -    6 Minute Liters of Oxygen  0 L  -    2 Minute Post Oxygen Saturation %  96 %  -    2 Minute Post Liters of Oxygen  0 L  -         Dr. Emily Filbert is Medical Director for [place] and [facility].

## 2018-02-17 ENCOUNTER — Encounter: Payer: Medicare Other | Admitting: *Deleted

## 2018-02-17 DIAGNOSIS — Z952 Presence of prosthetic heart valve: Secondary | ICD-10-CM

## 2018-02-17 NOTE — Progress Notes (Signed)
Daily Session Note  Patient Details  Name: Benjamin Frost MRN: [id number] Date of Birth: January 21, 1941 Referring Provider:     Cardiac Rehab from 12/01/2017 in [facility] Cardiac and Pulmonary Rehab  Referring Provider  Kathlyn Sacramento MD [Dr. Sherren Mocha (TAVR)]      Encounter Date: 02/17/2018  Check In: Session Check In - 02/17/18 0827      Check-In   Location  [facility]-Cardiac & Pulmonary Rehab    Staff Present  Heath Lark, RN, BSN, CCRP;Zaide Mcclenahan Luan Pulling, MA, RCEP, CCRP, Exercise Physiologist;Amanda Oletta Darter, [place], ACSM CEP, Exercise Physiologist    Supervising physician immediately available to respond to emergencies  See telemetry face sheet for immediately available ER MD    Medication changes reported      No    Fall or balance concerns reported     No    Warm-up and Cool-down  Performed on first and last piece of equipment    Resistance Training Performed  Yes    VAD Patient?  No      Pain Assessment   Currently in Pain?  No/denies          Social History   Tobacco Use  Smoking Status Former Smoker  . Years: 0.00  . Types: Cigarettes  . Last attempt to quit: 1992  . Years since quitting: 27.4  Smokeless Tobacco Former Systems developer  . Types: Snuff  Tobacco Comment   quit 25 years ago    Goals Met:  Independence with exercise equipment Exercise tolerated well No report of cardiac concerns or symptoms Strength training completed today  Goals Unmet:  Not Applicable  Comments: Pt able to follow exercise prescription today without complaint.  Will continue to monitor for progression.    Dr. Emily Filbert is Medical Director for [place] and [facility].

## 2018-02-19 DIAGNOSIS — Z952 Presence of prosthetic heart valve: Secondary | ICD-10-CM | POA: Diagnosis not present

## 2018-02-19 NOTE — Progress Notes (Signed)
Daily Session Note  Patient Details  Name: Benjamin Frost MRN: [id number] Date of Birth: 1941/03/11 Referring Provider:     Cardiac Rehab from 12/01/2017 in [facility] Cardiac and Pulmonary Rehab  Referring Provider  Kathlyn Sacramento MD [Dr. Sherren Mocha (TAVR)]      Encounter Date: 02/19/2018  Check In: Session Check In - 02/19/18 0900      Check-In   Location  [facility]-Cardiac & Pulmonary Rehab    Staff Present  Gerlene Burdock, RN, BSN;Jessica Luan Pulling, MA, RCEP, CCRP, Exercise Physiologist;Amanda Oletta Darter, BA, ACSM CEP, Exercise Physiologist    Supervising physician immediately available to respond to emergencies  See telemetry face sheet for immediately available ER MD    Medication changes reported      No    Fall or balance concerns reported     No    Warm-up and Cool-down  Performed on first and last piece of equipment    Resistance Training Performed  Yes    VAD Patient?  No      Pain Assessment   Currently in Pain?  No/denies    Multiple Pain Sites  No          Social History   Tobacco Use  Smoking Status Former Smoker  . Years: 0.00  . Types: Cigarettes  . Last attempt to quit: 1992  . Years since quitting: 27.4  Smokeless Tobacco Former Systems developer  . Types: Snuff  Tobacco Comment   quit 25 years ago    Goals Met:  Independence with exercise equipment Exercise tolerated well No report of cardiac concerns or symptoms Strength training completed today  Goals Unmet:  Not Applicable  Comments: Pt able to follow exercise prescription today without complaint.  Will continue to monitor for progression.    Dr. Emily Filbert is Medical Director for [place] and [facility].

## 2018-02-19 NOTE — Patient Instructions (Signed)
Discharge Patient Instructions  Patient Details  Name: Benjamin Frost MRN: [id number] Date of Birth: 03/02/1941 Referring Provider:  Wellington Hampshire, MD   Number of Visits: 36/36  Reason for Discharge:  Patient reached a stable level of exercise. Patient independent in their exercise. Patient has met program and personal goals.  Smoking History:  Social History   Tobacco Use  Smoking Status Former Smoker  . Years: 0.00  . Types: Cigarettes  . Last attempt to quit: 1992  . Years since quitting: 27.4  Smokeless Tobacco Former Systems developer  . Types: Snuff  Tobacco Comment   quit 25 years ago    Diagnosis:  S/P TAVR (transcatheter aortic valve replacement)  Initial Exercise Prescription: Initial Exercise Prescription - 12/01/17 1500      Date of Initial Exercise RX and Referring Provider   Date  12/01/17    Referring Provider  Kathlyn Sacramento MD Dr. Sherren Mocha (TAVR)      Treadmill   MPH  1.4    Grade  0.5    Minutes  15    METs  2.17      REL-XR   Level  1    Speed  50    Minutes  15    METs  2      T5 Nustep   Level  1    SPM  80    Minutes  15    METs  2      Prescription Details   Frequency (times per week)  2    Duration  Progress to 30 minutes of continuous aerobic without signs/symptoms of physical distress      Intensity   THRR 40-80% of Max Heartrate  101-128    Ratings of Perceived Exertion  11-13    Perceived Dyspnea  0-4      Progression   Progression  Continue to progress workloads to maintain intensity without signs/symptoms of physical distress.      Resistance Training   Training Prescription  Yes    Weight  3 lbs    Reps  10-15       Discharge Exercise Prescription (Final Exercise Prescription Changes): Exercise Prescription Changes - 02/17/18 1400      Response to Exercise   Blood Pressure (Admit)  126/54    Blood Pressure (Exercise)  134/72    Blood Pressure (Exit)  130/70    Heart Rate (Admit)  67 bpm    Heart Rate  (Exercise)  108 bpm    Heart Rate (Exit)  76 bpm    Rating of Perceived Exertion (Exercise)  13    Symptoms  none    Duration  Continue with 30 min of aerobic exercise without signs/symptoms of physical distress.    Intensity  THRR unchanged      Progression   Progression  Continue to progress workloads to maintain intensity without signs/symptoms of physical distress.    Average METs  2.87      Resistance Training   Training Prescription  Yes    Weight  4 lbs    Reps  10-15      Interval Training   Interval Training  No      Treadmill   MPH  1.8    Grade  0.5    Minutes  15    METs  2.5      REL-XR   Level  3    Minutes  15    METs  4  T5 Nustep   Level  3    Minutes  15    METs  2.1      Home Exercise Plan   Plans to continue exercise at  [facility] (comment) walking and [facility]    Frequency  Add 2 additional days to program exercise sessions.    Initial Home Exercises Provided  12/23/17       Functional Capacity: 6 Minute Walk    Row Name 12/01/17 1527 02/12/18 1020       6 Minute Walk   Phase  Initial  Discharge    Distance  1035 feet  1268 feet    Distance % Change  -  22.5 %    Distance Feet Change  -  233 ft    Walk Time  6 minutes  6 minutes    # of Rest Breaks  0  0    MPH  1.96  2.4    METS  2.06  2.29    RPE  13  13    Perceived Dyspnea   3  3    VO2 Peak  7.2  8.02    Symptoms  Yes (comment)  Yes (comment)    Comments  SOB  SOB    Resting HR  73 bpm  64 bpm    Resting BP  144/74  130/70    Resting Oxygen Saturation   95 %  -    Exercise Oxygen Saturation  during 6 min walk  96 %  -    Max Ex. HR  98 bpm  96 bpm    Max Ex. BP  156/74  134/74    2 Minute Post BP  146/72  -      Interval HR   1 Minute HR  97  -    2 Minute HR  97  -    3 Minute HR  96  -    4 Minute HR  97  -    5 Minute HR  98  -    6 Minute HR  96  -    2 Minute Post HR  75  -    Interval Heart Rate?  Yes  -      Interval Oxygen   Interval Oxygen?   Yes  -    Baseline Oxygen Saturation %  95 %  -    1 Minute Oxygen Saturation %  92 %  -    1 Minute Liters of Oxygen  0 L Room Air  -    2 Minute Oxygen Saturation %  90 %  -    2 Minute Liters of Oxygen  0 L  -    3 Minute Oxygen Saturation %  91 %  -    3 Minute Liters of Oxygen  0 L  -    4 Minute Oxygen Saturation %  91 %  -    4 Minute Liters of Oxygen  0 L  -    5 Minute Oxygen Saturation %  91 %  -    5 Minute Liters of Oxygen  0 L  -    6 Minute Oxygen Saturation %  91 %  -    6 Minute Liters of Oxygen  0 L  -    2 Minute Post Oxygen Saturation %  96 %  -    2 Minute Post Liters of Oxygen  0 L  -  Quality of Life: Quality of Life - 02/19/18 1203      Quality of Life Scores   Health/Function Pre  24.7 %    Health/Function Post  25.9 %    Health/Function % Change  4.86 %    Socioeconomic Pre  30 %    Socioeconomic Post  27.42 %    Socioeconomic % Change   -8.6 %    Psych/Spiritual Pre  29.14 %    Psych/Spiritual Post  29.29 %    Psych/Spiritual % Change  0.51 %    Family Pre  30 %    Family Post  28.5 %    Family % Change  -5 %    GLOBAL Pre  27.49 %    GLOBAL Post  27.29 %    GLOBAL % Change  -0.73 %       Personal Goals: Goals established at orientation with interventions provided to work toward goal. Personal Goals and Risk Factors at Admission - 12/01/17 1247      Core Components/Risk Factors/Patient Goals on Admission    Weight Management  Yes;Weight Loss    Intervention  Weight Management: Develop a combined nutrition and exercise program designed to reach desired caloric intake, while maintaining appropriate intake of nutrient and fiber, sodium and fats, and appropriate energy expenditure required for the weight goal.;Weight Management: Provide education and appropriate resources to help participant work on and attain dietary goals.;Weight Management/Obesity: Establish reasonable short term and long term weight goals.    Admit Weight  193 lb (87.5  kg) at home, weighs every morning; 202 lb on CR's scale    Goal Weight: Short Term  188 lb (85.3 kg)    Goal Weight: Long Term  185 lb (83.9 kg)    Expected Outcomes  Short Term: Continue to assess and modify interventions until short term weight is achieved;Long Term: Adherence to nutrition and physical activity/exercise program aimed toward attainment of established weight goal;Weight Loss: Understanding of general recommendations for a balanced deficit meal plan, which promotes 1-2 lb weight loss per week and includes a negative energy balance of [phone] kcal/d;Understanding recommendations for meals to include 15-35% energy as protein, 25-35% energy from fat, 35-60% energy from carbohydrates, less than '200mg'$  of dietary cholesterol, 20-35 gm of total fiber daily;Understanding of distribution of calorie intake throughout the day with the consumption of 4-5 meals/snacks    Heart Failure  Yes    Intervention  Provide a combined exercise and nutrition program that is supplemented with education, support and counseling about heart failure. Directed toward relieving symptoms such as shortness of breath, decreased exercise tolerance, and extremity edema.    Expected Outcomes  Improve functional capacity of life;Short term: Attendance in program 2-3 days a week with increased exercise capacity. Reported lower sodium intake. Reported increased fruit and vegetable intake. Reports medication compliance.;Short term: Daily weights obtained and reported for increase. Utilizing diuretic protocols set by physician.;Long term: Adoption of self-care skills and reduction of barriers for early signs and symptoms recognition and intervention leading to self-care maintenance.    Hypertension  Yes    Intervention  Provide education on lifestyle modifcations including regular physical activity/exercise, weight management, moderate sodium restriction and increased consumption of fresh fruit, vegetables, and low fat dairy,  alcohol moderation, and smoking cessation.;Monitor prescription use compliance.    Expected Outcomes  Short Term: Continued assessment and intervention until BP is < 140/29m HG in hypertensive participants. < 130/829mHG in hypertensive participants with diabetes, heart failure or  chronic kidney disease.;Long Term: Maintenance of blood pressure at goal levels.    Lipids  Yes    Intervention  Provide education and support for participant on nutrition & aerobic/resistive exercise along with prescribed medications to achieve LDL <6m, HDL >419m    Expected Outcomes  Short Term: Participant states understanding of desired cholesterol values and is compliant with medications prescribed. Participant is following exercise prescription and nutrition guidelines.;Long Term: Cholesterol controlled with medications as prescribed, with individualized exercise RX and with personalized nutrition plan. Value goals: LDL < 7073mHDL > 40 mg.    Stress  Yes ChaKeenennts to get back to doing more around the farm and other hobbies, such as trapping and hunting. However, Benjamin Frost shortness of breath limits that    Intervention  Offer individual and/or small group education and counseling on adjustment to heart disease, stress management and health-related lifestyle change. Teach and support self-help strategies.;Refer participants experiencing significant psychosocial distress to appropriate mental health specialists for further evaluation and treatment. When possible, include family members and significant others in education/counseling sessions.    Expected Outcomes  Short Term: Participant demonstrates changes in health-related behavior, relaxation and other stress management skills, ability to obtain effective social support, and compliance with psychotropic medications if prescribed.;Long Term: Emotional wellbeing is indicated by absence of clinically significant psychosocial distress or social isolation.        Personal  Goals Discharge: Goals and Risk Factor Review - 01/27/18 0913      Core Components/Risk Factors/Patient Goals Review   Personal Goals Review  Lipids;Hypertension    Review  Pt taking meds as directed.  BP has been good at home and in HT class.  He checks 02 at home as well.  Has been on prednisone and antibiotics past 2 weeks for bronchitis.    Expected Outcomes  Short - Pt will continue meds and exercise to maintain health and fitness Long - Pt will maintain exercise and healthy habits long term       Exercise Goals and Review: Exercise Goals    Row Name 12/01/17 1531             Exercise Goals   Increase Physical Activity  Yes       Intervention  Provide advice, education, support and counseling about physical activity/exercise needs.;Develop an individualized exercise prescription for aerobic and resistive training based on initial evaluation findings, risk stratification, comorbidities and participant's personal goals.       Expected Outcomes  Short Term: Attend rehab on a regular basis to increase amount of physical activity.;Long Term: Add in home exercise to make exercise part of routine and to increase amount of physical activity.;Long Term: Exercising regularly at least 3-5 days a week.       Increase Strength and Stamina  Yes       Intervention  Provide advice, education, support and counseling about physical activity/exercise needs.;Develop an individualized exercise prescription for aerobic and resistive training based on initial evaluation findings, risk stratification, comorbidities and participant's personal goals.       Expected Outcomes  Short Term: Increase workloads from initial exercise prescription for resistance, speed, and METs.;Short Term: Perform resistance training exercises routinely during rehab and add in resistance training at home;Long Term: Improve cardiorespiratory fitness, muscular endurance and strength as measured by increased METs and functional capacity  (6MWT)       Able to understand and use rate of perceived exertion (RPE) scale  Yes       Intervention  Provide education and explanation on how to use RPE scale       Expected Outcomes  Short Term: Able to use RPE daily in rehab to express subjective intensity level;Long Term:  Able to use RPE to guide intensity level when exercising independently       Able to understand and use Dyspnea scale  Yes       Intervention  Provide education and explanation on how to use Dyspnea scale       Expected Outcomes  Short Term: Able to use Dyspnea scale daily in rehab to express subjective sense of shortness of breath during exertion;Long Term: Able to use Dyspnea scale to guide intensity level when exercising independently       Knowledge and understanding of Target Heart Rate Range (THRR)  Yes       Intervention  Provide education and explanation of THRR including how the numbers were predicted and where they are located for reference       Expected Outcomes  Short Term: Able to state/look up THRR;Long Term: Able to use THRR to govern intensity when exercising independently;Short Term: Able to use daily as guideline for intensity in rehab       Able to check pulse independently  Yes       Intervention  Provide education and demonstration on how to check pulse in carotid and radial arteries.;Review the importance of being able to check your own pulse for safety during independent exercise       Expected Outcomes  Short Term: Able to explain why pulse checking is important during independent exercise;Long Term: Able to check pulse independently and accurately       Understanding of Exercise Prescription  Yes       Intervention  Provide education, explanation, and written materials on patient's individual exercise prescription       Expected Outcomes  Short Term: Able to explain program exercise prescription;Long Term: Able to explain home exercise prescription to exercise independently          Nutrition &  Weight - Outcomes: Pre Biometrics - 12/01/17 1532      Pre Biometrics   Height  5' 9.1" (1.755 m)    Weight  202 lb 4.8 oz (91.8 kg)    Waist Circumference  39.5 inches    Hip Circumference  44 inches    Waist to Hip Ratio  0.9 %    BMI (Calculated)  29.79    Single Leg Stand  4.8 seconds        Nutrition: Nutrition Therapy & Goals - 01/01/18 1027      Nutrition Therapy   Diet  TLC    Drug/Food Interactions  Statins/Certain Fruits    Protein (specify units)  8-9oz    Fiber  30 grams    Whole Grain Foods  3 servings    Saturated Fats  14 max. grams    Fruits and Vegetables  6 servings/day 8 ideal    Sodium  2000 grams 1500 ideal      Personal Nutrition Goals   Nutrition Goal  Increase intake of fruits consumed daily    Personal Goal #2  Consume sweets / desserts in moderation    Comments  he currently watches Benjamin Frost sodium intake, eats a variety of vegetables and lean meats, and eats out 1x/month      Intervention Plan   Intervention  Prescribe, educate and counsel regarding individualized specific dietary modifications aiming towards targeted core components such as weight,  hypertension, lipid management, diabetes, heart failure and other comorbidities.    Expected Outcomes  Short Term Goal: Understand basic principles of dietary content, such as calories, fat, sodium, cholesterol and nutrients.;Short Term Goal: A plan has been developed with personal nutrition goals set during dietitian appointment.;Long Term Goal: Adherence to prescribed nutrition plan.       Nutrition Discharge: Nutrition Assessments - 02/19/18 1203      MEDFICTS Scores   Pre Score  49    Post Score  58    Score Difference  9       Education Questionnaire Score: Knowledge Questionnaire Score - 02/19/18 1202      Knowledge Questionnaire Score   Pre Score  19/28    Post Score  24/28       Goals reviewed with patient; copy given to patient.

## 2018-02-20 ENCOUNTER — Other Ambulatory Visit: Payer: Self-pay | Admitting: *Deleted

## 2018-02-20 MED ORDER — IVABRADINE HCL 5 MG PO TABS: 5.0000 mg | ORAL_TABLET | Freq: Two times a day (BID) | ORAL | 2 refills | Status: DC

## 2018-02-24 ENCOUNTER — Encounter: Payer: Medicare Other | Attending: Cardiovascular Disease

## 2018-02-24 DIAGNOSIS — Z952 Presence of prosthetic heart valve: Secondary | ICD-10-CM | POA: Diagnosis not present

## 2018-02-24 NOTE — Progress Notes (Signed)
Daily Session Note  Patient Details  Name: Benjamin Frost MRN: [id number] Date of Birth: 1941-03-19 Referring Provider:     Cardiac Rehab from 12/01/2017 in [facility]  Referring Provider  Kathlyn Sacramento MD [Dr. Sherren Mocha (TAVR)]      Encounter Date: 02/24/2018  Check In: Session Check In - 02/24/18 0836      Check-In   Location  [facility]    Staff Present  Justin Mend RCP,RRT,BSRT;Heath Lark, RN, BSN, Lance Sell, BA, ACSM CEP, Exercise Physiologist    Supervising physician immediately available to respond to emergencies  See telemetry face sheet for immediately available ER MD    Medication changes reported      No    Fall or balance concerns reported     No    Tobacco Cessation  No Change    Warm-up and Cool-down  Performed on first and last piece of equipment    Resistance Training Performed  Yes    VAD Patient?  No      Pain Assessment   Currently in Pain?  No/denies          Social History   Tobacco Use  Smoking Status Former Smoker  . Years: 0.00  . Types: Cigarettes  . Last attempt to quit: 1992  . Years since quitting: 27.4  Smokeless Tobacco Former Systems developer  . Types: Snuff  Tobacco Comment   quit 25 years ago    Goals Met:  Independence with exercise equipment Exercise tolerated well No report of cardiac concerns or symptoms Strength training completed today  Goals Unmet:  Not Applicable  Comments:  Ashlee graduated today from  rehab with 36 sessions completed.  Details of the patient's exercise prescription and what He needs to do in order to continue the prescription and progress were discussed with patient.  Patient was given a copy of prescription and goals.  Patient verbalized understanding.  Johanna plans to continue to exercise by walking and going to [employer].   Dr. Emily Filbert is Medical Director for [facility].

## 2018-02-24 NOTE — Progress Notes (Signed)
Cardiac Individual Treatment Plan  Patient Details  Name: Benjamin Frost MRN: [id number] Date of Birth: 04/09/1941 Referring Provider:     Cardiac Rehab from 12/01/2017 in [facility] Cardiac and Pulmonary Rehab  Referring Provider  Kathlyn Sacramento MD [Dr. Sherren Mocha (TAVR)]      Initial Encounter Date:    Cardiac Rehab from 12/01/2017 in [facility] Cardiac and Pulmonary Rehab  Date  12/01/17  Referring Provider  Kathlyn Sacramento MD [Dr. Sherren Mocha (TAVR)]      Visit Diagnosis: S/P TAVR (transcatheter aortic valve replacement)  Patient's Home Medications on Admission:  Current Outpatient Medications:  .  albuterol (PROAIR HFA) 108 (90 BASE) MCG/ACT inhaler, Inhale 2 puffs into the lungs every 6 (six) hours as needed for wheezing or shortness of breath., Disp: , Rfl:  .  amoxicillin (AMOXIL) 500 MG tablet, Take 2,000 mg (four 500 mg tablets) one hour prior to dental appointments., Disp: 4 tablet, Rfl: 6 .  aspirin 81 MG tablet, Take 81 mg by mouth daily., Disp: , Rfl:  .  atorvastatin (LIPITOR) 40 MG tablet, Take 40 mg by mouth daily., Disp: , Rfl:  .  carvedilol (COREG) 3.125 MG tablet, Take 1 tablet (3.125 mg total) by mouth 2 (two) times daily., Disp: 60 tablet, Rfl: 3 .  clopidogrel (PLAVIX) 75 MG tablet, Take 1 tablet (75 mg total) by mouth daily with breakfast., Disp: 30 tablet, Rfl: 5 .  Fluticasone-Salmeterol (ADVAIR) 250-50 MCG/DOSE AEPB, Inhale 1 puff into the lungs 2 (two) times daily., Disp: , Rfl:  .  ivabradine (CORLANOR) 5 MG TABS tablet, Take 1 tablet (5 mg total) by mouth 2 (two) times daily with a meal., Disp: 60 tablet, Rfl: 2 .  losartan (COZAAR) 25 MG tablet, Take 1 tablet (25 mg total) by mouth daily., Disp: 30 tablet, Rfl: 3 .  pantoprazole (PROTONIX) 40 MG tablet, Take 1 tablet (40 mg total) by mouth daily., Disp: 30 tablet, Rfl: 6 .  spironolactone (ALDACTONE) 25 MG tablet, Take 1 tablet (25 mg total) by mouth daily., Disp: 30 tablet, Rfl: 0 .  tamsulosin (FLOMAX)  0.4 MG CAPS capsule, Take 0.4 mg by mouth daily., Disp: , Rfl:  .  tiotropium (SPIRIVA) 18 MCG inhalation capsule, Place 18 mcg into inhaler and inhale daily., Disp: , Rfl:   Past Medical History: Past Medical History:  Diagnosis Date  . AICD (automatic cardioverter/defibrillator) present    a. St Jude, placed after VF arrest during a dobuatamine echo.   . Aortic stenosis    a. mod-severe low flow low gradient AS.   Marland Kitchen Aortic stenosis, severe    a. 11/04/17: s/p TAVR by Dr. Burt Knack and Dr. Cyndia Bent.   . Chronic systolic heart failure ([place])   . COPD (chronic obstructive pulmonary disease) ([place])   . GERD (gastroesophageal reflux disease)   . History of BPH   . History of kidney stones   . Hyperlipidemia   . Hypertension   . Left bundle branch block   . S/P TAVR (transcatheter aortic valve replacement)    a. 10/2017: s/p TAVR with an Edwards Sapien 3 THV (size 26 mm, model # 9600TFX, serial # [id number])  . Sinus tachycardia     Tobacco Use: Social History   Tobacco Use  Smoking Status Former Smoker  . Years: 0.00  . Types: Cigarettes  . Last attempt to quit: 1992  . Years since quitting: 27.4  Smokeless Tobacco Former Systems developer  . Types: Snuff  Tobacco Comment   quit 25 years ago  Labs: Recent Review Flowsheet Data    Labs for ITP Cardiac and Pulmonary Rehab Latest Ref Rng & Units 05/16/2017 05/16/2017 10/31/2017 11/04/2017 11/04/2017   Cholestrol 0 - 200 mg/dL - - - - -   LDLCALC 0 - 100 mg/dL - - - - -   HDL 40 - 60 mg/dL - - - - -   Trlycerides 0 - 200 mg/dL - - - - -   Hemoglobin A1c 4.8 - 5.6 % - - 5.4 - -   PHART 7.350 - 7.450 7.402 - 7.432 - -   PCO2ART 32.0 - 48.0 mmHg 40.4 - 41.5 - -   HCO3 20.0 - 28.0 mmol/L 25.2 26.3 27.2 - -   TCO2 22 - 32 mmol/L 26 28 - 29 29   O2SAT % 95.0 67.0 95.5 - -       Exercise Target Goals:    Exercise Program Goal: Individual exercise prescription set using results from initial 6 min walk test and THRR while considering  patient's  activity barriers and safety.   Exercise Prescription Goal: Initial exercise prescription builds to 30-45 minutes a day of aerobic activity, 2-3 days per week.  Home exercise guidelines will be given to patient during program as part of exercise prescription that the participant will acknowledge.  Activity Barriers & Risk Stratification: Activity Barriers & Cardiac Risk Stratification - 12/01/17 1254      Activity Barriers & Cardiac Risk Stratification   Activity Barriers  Arthritis;Shortness of Breath    Cardiac Risk Stratification  Moderate       6 Minute Walk: 6 Minute Walk    Row Name 12/01/17 1527 02/12/18 1020       6 Minute Walk   Phase  Initial  Discharge    Distance  1035 feet  1268 feet    Distance % Change  -  22.5 %    Distance Feet Change  -  233 ft    Walk Time  6 minutes  6 minutes    # of Rest Breaks  0  0    MPH  1.96  2.4    METS  2.06  2.29    RPE  13  13    Perceived Dyspnea   3  3    VO2 Peak  7.2  8.02    Symptoms  Yes (comment)  Yes (comment)    Comments  SOB  SOB    Resting HR  73 bpm  64 bpm    Resting BP  144/74  130/70    Resting Oxygen Saturation   95 %  -    Exercise Oxygen Saturation  during 6 min walk  96 %  -    Max Ex. HR  98 bpm  96 bpm    Max Ex. BP  156/74  134/74    2 Minute Post BP  146/72  -      Interval HR   1 Minute HR  97  -    2 Minute HR  97  -    3 Minute HR  96  -    4 Minute HR  97  -    5 Minute HR  98  -    6 Minute HR  96  -    2 Minute Post HR  75  -    Interval Heart Rate?  Yes  -      Interval Oxygen   Interval Oxygen?  Yes  -  Baseline Oxygen Saturation %  95 %  -    1 Minute Oxygen Saturation %  92 %  -    1 Minute Liters of Oxygen  0 L Room Air  -    2 Minute Oxygen Saturation %  90 %  -    2 Minute Liters of Oxygen  0 L  -    3 Minute Oxygen Saturation %  91 %  -    3 Minute Liters of Oxygen  0 L  -    4 Minute Oxygen Saturation %  91 %  -    4 Minute Liters of Oxygen  0 L  -    5 Minute Oxygen  Saturation %  91 %  -    5 Minute Liters of Oxygen  0 L  -    6 Minute Oxygen Saturation %  91 %  -    6 Minute Liters of Oxygen  0 L  -    2 Minute Post Oxygen Saturation %  96 %  -    2 Minute Post Liters of Oxygen  0 L  -       Oxygen Initial Assessment:   Oxygen Re-Evaluation:   Oxygen Discharge (Final Oxygen Re-Evaluation):   Initial Exercise Prescription: Initial Exercise Prescription - 12/01/17 1500      Date of Initial Exercise RX and Referring Provider   Date  12/01/17    Referring Provider  Kathlyn Sacramento MD Dr. Sherren Mocha (TAVR)      Treadmill   MPH  1.4    Grade  0.5    Minutes  15    METs  2.17      REL-XR   Level  1    Speed  50    Minutes  15    METs  2      T5 Nustep   Level  1    SPM  80    Minutes  15    METs  2      Prescription Details   Frequency (times per week)  2    Duration  Progress to 30 minutes of continuous aerobic without signs/symptoms of physical distress      Intensity   THRR 40-80% of Max Heartrate  101-128    Ratings of Perceived Exertion  11-13    Perceived Dyspnea  0-4      Progression   Progression  Continue to progress workloads to maintain intensity without signs/symptoms of physical distress.      Resistance Training   Training Prescription  Yes    Weight  3 lbs    Reps  10-15       Perform Capillary Blood Glucose checks as needed.  Exercise Prescription Changes: Exercise Prescription Changes    Row Name 12/01/17 1300 12/23/17 1500 01/06/18 1500 01/20/18 1200 02/03/18 1100     Response to Exercise   Blood Pressure (Admit)  144/74  128/60  116/74  112/70  104/68   Blood Pressure (Exercise)  156/74  108/62  114/56  134/80  148/80   Blood Pressure (Exit)  146/72  124/70  132/74  116/70  128/62   Heart Rate (Admit)  73 bpm  82 bpm  77 bpm  84 bpm  86 bpm   Heart Rate (Exercise)  98 bpm  107 bpm  115 bpm  108 bpm  105 bpm   Heart Rate (Exit)  75 bpm  81 bpm  68 bpm  70  bpm  83 bpm   Oxygen Saturation  (Admit)  95 %  -  -  -  -   Oxygen Saturation (Exercise)  90 %  -  -  -  -   Rating of Perceived Exertion (Exercise)  13  13  12  12  12    Perceived Dyspnea (Exercise)  3  -  -  -  -   Symptoms  SOB  none  none  none  none   Comments  walk test results  -  -  -  -   Duration  -  Continue with 30 min of aerobic exercise without signs/symptoms of physical distress.  Continue with 30 min of aerobic exercise without signs/symptoms of physical distress.  Continue with 30 min of aerobic exercise without signs/symptoms of physical distress.  Continue with 30 min of aerobic exercise without signs/symptoms of physical distress.   Intensity  -  THRR unchanged  THRR unchanged  THRR unchanged  THRR unchanged     Progression   Progression  -  Continue to progress workloads to maintain intensity without signs/symptoms of physical distress.  Continue to progress workloads to maintain intensity without signs/symptoms of physical distress.  Continue to progress workloads to maintain intensity without signs/symptoms of physical distress.  Continue to progress workloads to maintain intensity without signs/symptoms of physical distress.   Average METs  -  2.09  2.32  2.39  2.93     Resistance Training   Training Prescription  -  Yes  Yes  Yes  Yes   Weight  -  3 lbs  3 lbs  3 lbs  4 lbs   Reps  -  10-15  10-15  10-15  10-15     Interval Training   Interval Training  -  -  -  No  No     Treadmill   MPH  -  1.4  1.4  1.4  1.8   Grade  -  0.5  0.5  0.5  0.5   Minutes  -  15  15  15  15    METs  -  2.17  2.17  2.17  2.5     REL-XR   Level  -  1  2  2  3    Minutes  -  15  15  15  15    METs  -  2  2.9  3  4.4     T5 Nustep   Level  -  1  2  2  2    Minutes  -  15  15  15  15    METs  -  2  1.9  1.9  1.9     Home Exercise Plan   Plans to continue exercise at  -  [employer] (comment) walking and the [employer] (comment) walking and the [employer] (comment) walking and the  [employer] (comment) walking and the [employer]  -  Add 1 additional day to program exercise sessions.  Add 1 additional day to program exercise sessions.  Add 1 additional day to program exercise sessions.  Add 1 additional day to program exercise sessions.   Initial Home Exercises Provided  -  12/23/17  12/23/17  12/23/17  12/23/17   Row Name 02/17/18 1400             Response to Exercise   Blood Pressure (Admit)  126/54  Blood Pressure (Exercise)  134/72       Blood Pressure (Exit)  130/70       Heart Rate (Admit)  67 bpm       Heart Rate (Exercise)  108 bpm       Heart Rate (Exit)  76 bpm       Rating of Perceived Exertion (Exercise)  13       Symptoms  none       Duration  Continue with 30 min of aerobic exercise without signs/symptoms of physical distress.       Intensity  THRR unchanged         Progression   Progression  Continue to progress workloads to maintain intensity without signs/symptoms of physical distress.       Average METs  2.87         Resistance Training   Training Prescription  Yes       Weight  4 lbs       Reps  10-15         Interval Training   Interval Training  No         Treadmill   MPH  1.8       Grade  0.5       Minutes  15       METs  2.5         REL-XR   Level  3       Minutes  15       METs  4         T5 Nustep   Level  3       Minutes  15       METs  2.1         Home Exercise Plan   Plans to continue exercise at  [employer] (comment) walking and [facility] [employer]       Frequency  Add 2 additional days to program exercise sessions.       Initial Home Exercises Provided  12/23/17          Exercise Comments: Exercise Comments    Row Name 12/11/17 0818 12/18/17 0940 02/24/18 0841       Exercise Comments  First full day of exercise!  Patient was oriented to gym and equipment including functions, settings, policies, and procedures.  Patient's individual exercise prescription and treatment plan were  reviewed.  All starting workloads were established based on the results of the 6 minute walk test done at initial orientation visit.  The plan for exercise progression was also introduced and progression will be customized based on patient's performance and goals.  Reviewed home exercise with pt today.  Pt plans to walk and go to the [place] for exercise.  Reviewed THR, pulse, RPE, sign and symptoms, NTG use, and when to call 911 or MD.  Also discussed weather considerations and indoor options.  Pt voiced understanding.  Enoch graduated today from  rehab with 36 sessions completed.  Details of the patient's exercise prescription and what He needs to do in order to continue the prescription and progress were discussed with patient.  Patient was given a copy of prescription and goals.  Patient verbalized understanding.  Kirubel plans to continue to exercise by walking and going to [employer].        Exercise Goals and Review: Exercise Goals    Row Name 12/01/17 1531             Exercise  Goals   Increase Physical Activity  Yes       Intervention  Provide advice, education, support and counseling about physical activity/exercise needs.;Develop an individualized exercise prescription for aerobic and resistive training based on initial evaluation findings, risk stratification, comorbidities and participant's personal goals.       Expected Outcomes  Short Term: Attend rehab on a regular basis to increase amount of physical activity.;Long Term: Add in home exercise to make exercise part of routine and to increase amount of physical activity.;Long Term: Exercising regularly at least 3-5 days a week.       Increase Strength and Stamina  Yes       Intervention  Provide advice, education, support and counseling about physical activity/exercise needs.;Develop an individualized exercise prescription for aerobic and resistive training based on initial evaluation findings, risk stratification, comorbidities and  participant's personal goals.       Expected Outcomes  Short Term: Increase workloads from initial exercise prescription for resistance, speed, and METs.;Short Term: Perform resistance training exercises routinely during rehab and add in resistance training at home;Long Term: Improve cardiorespiratory fitness, muscular endurance and strength as measured by increased METs and functional capacity (6MWT)       Able to understand and use rate of perceived exertion (RPE) scale  Yes       Intervention  Provide education and explanation on how to use RPE scale       Expected Outcomes  Short Term: Able to use RPE daily in rehab to express subjective intensity level;Long Term:  Able to use RPE to guide intensity level when exercising independently       Able to understand and use Dyspnea scale  Yes       Intervention  Provide education and explanation on how to use Dyspnea scale       Expected Outcomes  Short Term: Able to use Dyspnea scale daily in rehab to express subjective sense of shortness of breath during exertion;Long Term: Able to use Dyspnea scale to guide intensity level when exercising independently       Knowledge and understanding of Target Heart Rate Range (THRR)  Yes       Intervention  Provide education and explanation of THRR including how the numbers were predicted and where they are located for reference       Expected Outcomes  Short Term: Able to state/look up THRR;Long Term: Able to use THRR to govern intensity when exercising independently;Short Term: Able to use daily as guideline for intensity in rehab       Able to check pulse independently  Yes       Intervention  Provide education and demonstration on how to check pulse in carotid and radial arteries.;Review the importance of being able to check your own pulse for safety during independent exercise       Expected Outcomes  Short Term: Able to explain why pulse checking is important during independent exercise;Long Term: Able to check  pulse independently and accurately       Understanding of Exercise Prescription  Yes       Intervention  Provide education, explanation, and written materials on patient's individual exercise prescription       Expected Outcomes  Short Term: Able to explain program exercise prescription;Long Term: Able to explain home exercise prescription to exercise independently          Exercise Goals Re-Evaluation : Exercise Goals Re-Evaluation    Row Name 12/11/17 0818 12/18/17 0940 12/23/17 1458 01/06/18  1512 01/20/18 1222     Exercise Goal Re-Evaluation   Exercise Goals Review  Increase Physical Activity;Increase Strength and Stamina;Able to understand and use rate of perceived exertion (RPE) scale;Knowledge and understanding of Target Heart Rate Range (THRR)  Increase Physical Activity;Increase Strength and Stamina;Able to understand and use rate of perceived exertion (RPE) scale;Knowledge and understanding of Target Heart Rate Range (THRR);Able to check pulse independently;Understanding of Exercise Prescription  Increase Physical Activity;Increase Strength and Stamina;Understanding of Exercise Prescription  Increase Physical Activity;Increase Strength and Stamina;Understanding of Exercise Prescription  Increase Physical Activity;Increase Strength and Stamina;Understanding of Exercise Prescription   Comments  Reviewed RPE scale, THR and program prescription with pt today.  Pt voiced understanding and was given a copy of goals to take home.   Reviewed home exercise with pt today.  Pt plans to walk and go to the [place] for exercise.  Reviewed THR, pulse, RPE, sign and symptoms, NTG use, and when to call 911 or MD.  Also discussed weather considerations and indoor options.  Pt voiced understanding.  Eduard Clos is off to a good start in rehab.  He has already started to mesh with his classmates.  He should be ready to start to increase his workloads.  We will continue to monitor his progression.   Eduard Clos has been doing  well in rehab.  He is now on the XR at level 2.  We will continue to monitor his progression.  Eduard Clos has only attended once since last review.  As a result, we have not seen much progress.    Expected Outcomes  Short: Use RPE daily to regulate intensity.  Long: Follow program prescription in THR.  Short - Pt will add 1 day of exercise in addition to program sessions Long - Pt will exercise on his own  Short: Begin to increase workloads.  Long: Continue to add in 1 extra day of exercise at home.   Short: Move up workloads on T5 NuStep.  Long: Continue to exercise more at home.   Short: Atted consistently.  Long: Continue to build strength and stamina.    [place] Name 02/03/18 1113 02/17/18 1427           Exercise Goal Re-Evaluation   Exercise Goals Review  Increase Physical Activity;Increase Strength and Stamina;Understanding of Exercise Prescription  Increase Physical Activity;Increase Strength and Stamina;Understanding of Exercise Prescription      Comments  Eduard Clos has been doing well in rehab.  He is up to 1.8 mph on the treadmill.  We will continue to monitor his progression,.   Eduard Clos is nearing graduation.  He improved his post 6MWT by 258f!!  He plans to conitnue to walk and [place]       Expected Outcomes  Short: Continue to increase workloads.  Long: Continue to work on increasing activity.   Short: Graduate!  Long: Continue to exercise independently         Discharge Exercise Prescription (Final Exercise Prescription Changes): Exercise Prescription Changes - 02/17/18 1400      Response to Exercise   Blood Pressure (Admit)  126/54    Blood Pressure (Exercise)  134/72    Blood Pressure (Exit)  130/70    Heart Rate (Admit)  67 bpm    Heart Rate (Exercise)  108 bpm    Heart Rate (Exit)  76 bpm    Rating of Perceived Exertion (Exercise)  13    Symptoms  none    Duration  Continue with 30 min of  aerobic exercise without signs/symptoms of physical distress.     Intensity  THRR unchanged      Progression   Progression  Continue to progress workloads to maintain intensity without signs/symptoms of physical distress.    Average METs  2.87      Resistance Training   Training Prescription  Yes    Weight  4 lbs    Reps  10-15      Interval Training   Interval Training  No      Treadmill   MPH  1.8    Grade  0.5    Minutes  15    METs  2.5      REL-XR   Level  3    Minutes  15    METs  4      T5 Nustep   Level  3    Minutes  15    METs  2.1      Home Exercise Plan   Plans to continue exercise at  [employer] (comment) walking and [place] [employer]    Frequency  Add 2 additional days to program exercise sessions.    Initial Home Exercises Provided  12/23/17       Nutrition:  Target Goals: Understanding of nutrition guidelines, daily intake of sodium <1532m, cholesterol <2067m calories 30% from fat and 7% or less from saturated fats, daily to have 5 or more servings of fruits and vegetables.  Biometrics: Pre Biometrics - 12/01/17 1532      Pre Biometrics   Height  5' 9.1" (1.755 m)    Weight  202 lb 4.8 oz (91.8 kg)    Waist Circumference  39.5 inches    Hip Circumference  44 inches    Waist to Hip Ratio  0.9 %    BMI (Calculated)  29.79    Single Leg Stand  4.8 seconds        Nutrition Therapy Plan and Nutrition Goals: Nutrition Therapy & Goals - 01/01/18 1027      Nutrition Therapy   Diet  TLC    Drug/Food Interactions  Statins/Certain Fruits    Protein (specify units)  8-9oz    Fiber  30 grams    Whole Grain Foods  3 servings    Saturated Fats  14 max. grams    Fruits and Vegetables  6 servings/day 8 ideal    Sodium  2000 grams 1500 ideal      Personal Nutrition Goals   Nutrition Goal  Increase intake of fruits consumed daily    Personal Goal #2  Consume sweets / desserts in moderation    Comments  he currently watches his sodium intake, eats a variety of vegetables and lean meats, and eats out 1x/month       Intervention Plan   Intervention  Prescribe, educate and counsel regarding individualized specific dietary modifications aiming towards targeted core components such as weight, hypertension, lipid management, diabetes, heart failure and other comorbidities.    Expected Outcomes  Short Term Goal: Understand basic principles of dietary content, such as calories, fat, sodium, cholesterol and nutrients.;Short Term Goal: A plan has been developed with personal nutrition goals set during dietitian appointment.;Long Term Goal: Adherence to prescribed nutrition plan.       Nutrition Assessments: Nutrition Assessments - 02/19/18 1203      MEDFICTS Scores   Pre Score  49    Post Score  58    Score Difference  9       Nutrition  Goals Re-Evaluation: Nutrition Goals Re-Evaluation    Row Name 12/22/17 0927 01/27/18 0909           Goals   Nutrition Goal  Decrease consumption of fried foods. Choose baked, broiled or grilled instead. When ordering out, opt for vegetables or a baked potato as a side rather than french fries  -      Comment  Patient currently eats fried foods regularly  Pt states he only has fried food once per week, eats a lot of bananas.  They do use 2% milk.  They eat out only once per month.        Expected Outcome  Patient will choose non-fried meats and sides at least half the time  Short - Pt will ask wife buy more fruits and veggies and  if she buys them he will eat them  Long - maintain other heathy choices such as reduced sodium and not eating fried foods        Personal Goal #2 Re-Evaluation   Personal Goal #2  Choose lower-fat condiments, dressings and dairy products. Look for labeling such as "part skim," "reduced fat," or "light" patient currently chooses full fat condiments, dressings and dairy  -         Nutrition Goals Discharge (Final Nutrition Goals Re-Evaluation): Nutrition Goals Re-Evaluation - 01/27/18 0909      Goals   Comment  Pt states he only has fried  food once per week, eats a lot of bananas.  They do use 2% milk.  They eat out only once per month.      Expected Outcome  Short - Pt will ask wife buy more fruits and veggies and  if she buys them he will eat them  Long - maintain other heathy choices such as reduced sodium and not eating fried foods       Psychosocial: Target Goals: Acknowledge presence or absence of significant depression and/or stress, maximize coping skills, provide positive support system. Participant is able to verbalize types and ability to use techniques and skills needed for reducing stress and depression.   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 However, his shortness of breath limits that      Family Dynamics   Good Support System?  Yes wife, children      Barriers   Psychosocial barriers to participate in program  There are no identifiable barriers or psychosocial needs.;Psychosocial barriers identified (see note);The patient should benefit from training in stress management and relaxation.      Screening Interventions   Interventions  Encouraged to exercise;Provide feedback about the scores to participant;Program counselor consult;To provide support and resources with identified psychosocial needs    Expected Outcomes  Short Term goal: Utilizing psychosocial counselor, staff and physician to assist with identification of specific Stressors or current issues interfering with healing process. Setting desired goal for each stressor or current issue identified.;Long Term Goal: Stressors or current issues are controlled or eliminated.;Short Term goal: Identification and review with participant of any Quality of  Life or Depression concerns found by scoring the questionnaire.;Long Term goal: The participant improves quality of Life and PHQ9 Scores as seen  by post scores and/or verbalization of changes       Quality of Life Scores:  Quality of Life - 02/19/18 1203      Quality of Life Scores   Health/Function Pre  24.7 %    Health/Function Post  25.9 %    Health/Function % Change  4.86 %    Socioeconomic Pre  30 %    Socioeconomic Post  27.42 %    Socioeconomic % Change   -8.6 %    Psych/Spiritual Pre  29.14 %    Psych/Spiritual Post  29.29 %    Psych/Spiritual % Change  0.51 %    Family Pre  30 %    Family Post  28.5 %    Family % Change  -5 %    GLOBAL Pre  27.49 %    GLOBAL Post  27.29 %    GLOBAL % Change  -0.73 %      Scores of 19 and below usually indicate a poorer quality of life in these areas.  A difference of  2-3 points is a clinically meaningful difference.  A difference of 2-3 points in the total score of the Quality of Life Index has been associated with significant improvement in overall quality of life, self-image, physical symptoms, and general health in studies assessing change in quality of life.  PHQ-9: Recent Review Flowsheet Data    Depression screen [facility] 2/9 02/19/2018 12/01/2017   Decreased Interest 0 0   Down, Depressed, Hopeless 0 0   PHQ - 2 Score 0 0   Altered sleeping 0 0   Tired, decreased energy 0 0   Change in appetite 0 0   Feeling bad or failure about yourself  0 0   Trouble concentrating 0 0   Moving slowly or fidgety/restless 0 0   Suicidal thoughts 0 0   PHQ-9 Score 0 0     Interpretation of Total Score  Total Score Depression Severity:  1-4 = Minimal depression, 5-9 = Mild depression, 10-14 = Moderate depression, 15-19 = Moderately severe depression, 20-27 = Severe depression   Psychosocial Evaluation and Intervention: Psychosocial Evaluation - 12/16/17 0951      Psychosocial Evaluation & Interventions   Interventions  Encouraged to exercise  with the program and follow exercise prescription;Stress management education    Comments  Counselor met with Mr. [facility]kelley [facility]) today for initial psychosocial evaluation.  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  Eduard Clos has a strong support system with a spouse of 38 years; (4) adult children who live locally; and he is actively involved in his local church.  Eduard Clos reports having hearing problems and COPD in addition to his cardiac health concerns.  He sleeps well and has a good appetite.  Eduard Clos denies a history of depression or anxiety or any current symptoms and is typically in a positive mood.  Eduard Clos states he has minimal stress in his life other than his concern on occasion about his adult children.  He has goals to breathe better and increase his stamina while in this program.  Staff will follow with Charlie.    Expected Outcomes  Short: Eduard Clos will attend the educational workshop on Stress and anxiety to cope better with his concerns for his family.   Long Term:   Eduard Clos will learn to exercise more consistently for stress and his health goals.    Continue Psychosocial Services   Follow up required by staff  Psychosocial Re-Evaluation:   Psychosocial Discharge (Final Psychosocial Re-Evaluation):   Vocational Rehabilitation: Provide vocational rehab assistance to qualifying candidates.   Vocational Rehab Evaluation & Intervention: Vocational Rehab - 12/01/17 1252      Initial Vocational Rehab Evaluation & Intervention   Assessment shows need for Vocational Rehabilitation  No       Education: Education Goals: Education classes will be provided on a variety of topics geared toward better understanding of heart health and risk factor modification. Participant will state understanding/return demonstration of topics presented as noted by education test scores.  Learning Barriers/Preferences: Learning Barriers/Preferences - 12/01/17  1251      Learning Barriers/Preferences   Learning Barriers  Hearing;Sight;Exercise Concerns    Learning Preferences  Individual Instruction       Education Topics:  AED/CPR: - Group verbal and written instruction with the use of models to demonstrate the basic use of the AED with the basic ABC's of resuscitation.   Cardiac Rehab from 02/24/2018 in [facility] Cardiac and Pulmonary Rehab  Date  02/12/18  Educator  CE  Instruction Review Code  1- Verbalizes Understanding      General Nutrition Guidelines/Fats and Fiber: -Group instruction provided by verbal, written material, models and posters to present the general guidelines for heart healthy nutrition. Gives an explanation and review of dietary fats and fiber.   Cardiac Rehab from 02/24/2018 in [facility] Cardiac and Pulmonary Rehab  Date  02/10/18  Educator  PI  Instruction Review Code  1- Verbalizes Understanding      Controlling Sodium/Reading Food Labels: -Group verbal and written material supporting the discussion of sodium use in heart healthy nutrition. Review and explanation with models, verbal and written materials for utilization of the food label.   Cardiac Rehab from 02/24/2018 in [facility] Cardiac and Pulmonary Rehab  Date  02/17/18  Educator  PI  Instruction Review Code  1- Verbalizes Understanding      Exercise Physiology & General Exercise Guidelines: - Group verbal and written instruction with models to review the exercise physiology of the cardiovascular system and associated critical values. Provides general exercise guidelines with specific guidelines to those with heart or lung disease.    Cardiac Rehab from 02/24/2018 in [facility] Cardiac and Pulmonary Rehab  Date  02/24/18  Educator  AS  Instruction Review Code  1- Verbalizes Understanding      Aerobic Exercise & Resistance Training: - Gives group verbal and written instruction on the various components of exercise. Focuses on aerobic and resistive training programs and the  benefits of this training and how to safely progress through these programs..   Cardiac Rehab from 02/24/2018 in [facility] Cardiac and Pulmonary Rehab  Date  01/06/18  Educator  AS  Instruction Review Code  1- Verbalizes Understanding      Flexibility, Balance, Mind/Body Relaxation: Provides group verbal/written instruction on the benefits of flexibility and balance training, including mind/body exercise modes such as yoga, pilates and tai chi.  Demonstration and skill practice provided.   Stress and Anxiety: - Provides group verbal and written instruction about the health risks of elevated stress and causes of high stress.  Discuss the correlation between heart/lung disease and anxiety and treatment options. Review healthy ways to manage with stress and anxiety.   Depression: - Provides group verbal and written instruction on the correlation between heart/lung disease and depressed mood, treatment options, and the stigmas associated with seeking treatment.   Cardiac Rehab from 02/24/2018 in [facility] Cardiac and Pulmonary Rehab  Date  01/08/18  Educator  [place]  Instruction Review Code  1- Actuary & Physiology of the Heart: - Group verbal and written instruction and models provide basic cardiac anatomy and physiology, with the coronary electrical and arterial systems. Review of Valvular disease and Heart Failure   Cardiac Rehab from 02/24/2018 in [facility] Cardiac and Pulmonary Rehab  Date  12/11/17  Educator  CE  Instruction Review Code  1- Verbalizes Understanding      Cardiac Procedures: - Group verbal and written instruction to review commonly prescribed medications for heart disease. Reviews the medication, class of the drug, and side effects. Includes the steps to properly store meds and maintain the prescription regimen. (beta blockers and nitrates)   Cardiac Rehab from 02/24/2018 in [facility] Cardiac and Pulmonary Rehab  Date  02/05/18  Educator  CE  Instruction Review  Code  1- Verbalizes Understanding      Cardiac Medications I: - Group verbal and written instruction to review commonly prescribed medications for heart disease. Reviews the medication, class of the drug, and side effects. Includes the steps to properly store meds and maintain the prescription regimen.   Cardiac Rehab from 02/24/2018 in [facility] Cardiac and Pulmonary Rehab  Date  01/27/18  Educator  [place]  Instruction Review Code  1- Verbalizes Understanding      Cardiac Medications II: -Group verbal and written instruction to review commonly prescribed medications for heart disease. Reviews the medication, class of the drug, and side effects. (all other drug classes)    Go Sex-Intimacy & Heart Disease, Get SMART - Goal Setting: - Group verbal and written instruction through game format to discuss heart disease and the return to sexual intimacy. Provides group verbal and written material to discuss and apply goal setting through the application of the S.M.A.R.T. Method.   Cardiac Rehab from 02/24/2018 in [facility] Cardiac and Pulmonary Rehab  Date  02/05/18  Educator  CE  Instruction Review Code  1- Verbalizes Understanding      Other Matters of the Heart: - Provides group verbal, written materials and models to describe Stable Angina and Peripheral Artery. Includes description of the disease process and treatment options available to the cardiac patient.   Exercise & Equipment Safety: - Individual verbal instruction and demonstration of equipment use and safety with use of the equipment.   Cardiac Rehab from 02/24/2018 in [facility] Cardiac and Pulmonary Rehab  Date  12/01/17  Educator  [facility]  Instruction Review Code  1- Verbalizes Understanding      Infection Prevention: - Provides verbal and written material to individual with discussion of infection control including proper hand washing and proper equipment cleaning during exercise session.   Cardiac Rehab from 02/24/2018 in [facility] Cardiac and  Pulmonary Rehab  Date  12/01/17  Educator  [facility]  Instruction Review Code  1- Verbalizes Understanding      Falls Prevention: - Provides verbal and written material to individual with discussion of falls prevention and safety.   Cardiac Rehab from 02/24/2018 in [facility] Cardiac and Pulmonary Rehab  Date  12/01/17  Educator  [facility]  Instruction Review Code  1- Verbalizes Understanding      Diabetes: - Individual verbal and written instruction to review signs/symptoms of diabetes, desired ranges of glucose level fasting, after meals and with exercise. Acknowledge that pre and post exercise glucose checks will be done for 3 sessions at entry of program.   Know Your Numbers and Risk Factors: -Group verbal and written instruction about important  numbers in your health.  Discussion of what are risk factors and how they play a role in the disease process.  Review of Cholesterol, Blood Pressure, Diabetes, and BMI and the role they play in your overall health.   Sleep Hygiene: -Provides group verbal and written instruction about how sleep can affect your health.  Define sleep hygiene, discuss sleep cycles and impact of sleep habits. Review good sleep hygiene tips.    Cardiac Rehab from 02/24/2018 in [facility] Cardiac and Pulmonary Rehab  Date  02/03/18  Educator  [facility]  Instruction Review Code  1- Verbalizes Understanding      Other: -Provides group and verbal instruction on various topics (see comments)   Knowledge Questionnaire Score: Knowledge Questionnaire Score - 02/19/18 1202      Knowledge Questionnaire Score   Pre Score  19/28    Post Score  24/28       Core Components/Risk Factors/Patient Goals at Admission: Personal Goals and Risk Factors at Admission - 12/01/17 1247      Core Components/Risk Factors/Patient Goals on Admission    Weight Management  Yes;Weight Loss    Intervention  Weight Management: Develop a combined nutrition and exercise program designed to reach desired caloric  intake, while maintaining appropriate intake of nutrient and fiber, sodium and fats, and appropriate energy expenditure required for the weight goal.;Weight Management: Provide education and appropriate resources to help participant work on and attain dietary goals.;Weight Management/Obesity: Establish reasonable short term and long term weight goals.    Admit Weight  193 lb (87.5 kg) at home, weighs every morning; 202 lb on CR's scale    Goal Weight: Short Term  188 lb (85.3 kg)    Goal Weight: Long Term  185 lb (83.9 kg)    Expected Outcomes  Short Term: Continue to assess and modify interventions until short term weight is achieved;Long Term: Adherence to nutrition and physical activity/exercise program aimed toward attainment of established weight goal;Weight Loss: Understanding of general recommendations for a balanced deficit meal plan, which promotes 1-2 lb weight loss per week and includes a negative energy balance of [phone] kcal/d;Understanding recommendations for meals to include 15-35% energy as protein, 25-35% energy from fat, 35-60% energy from carbohydrates, less than 220m of dietary cholesterol, 20-35 gm of total fiber daily;Understanding of distribution of calorie intake throughout the day with the consumption of 4-5 meals/snacks    Heart Failure  Yes    Intervention  Provide a combined exercise and nutrition program that is supplemented with education, support and counseling about heart failure. Directed toward relieving symptoms such as shortness of breath, decreased exercise tolerance, and extremity edema.    Expected Outcomes  Improve functional capacity of life;Short term: Attendance in program 2-3 days a week with increased exercise capacity. Reported lower sodium intake. Reported increased fruit and vegetable intake. Reports medication compliance.;Short term: Daily weights obtained and reported for increase. Utilizing diuretic protocols set by physician.;Long term: Adoption of  self-care skills and reduction of barriers for early signs and symptoms recognition and intervention leading to self-care maintenance.    Hypertension  Yes    Intervention  Provide education on lifestyle modifcations including regular physical activity/exercise, weight management, moderate sodium restriction and increased consumption of fresh fruit, vegetables, and low fat dairy, alcohol moderation, and smoking cessation.;Monitor prescription use compliance.    Expected Outcomes  Short Term: Continued assessment and intervention until BP is < 140/957mHG in hypertensive participants. < 130/8055mG in hypertensive participants with diabetes, heart failure or  chronic kidney disease.;Long Term: Maintenance of blood pressure at goal levels.    Lipids  Yes    Intervention  Provide education and support for participant on nutrition & aerobic/resistive exercise along with prescribed medications to achieve LDL <15m, HDL >457m    Expected Outcomes  Short Term: Participant states understanding of desired cholesterol values and is compliant with medications prescribed. Participant is following exercise prescription and nutrition guidelines.;Long Term: Cholesterol controlled with medications as prescribed, with individualized exercise RX and with personalized nutrition plan. Value goals: LDL < 7041mHDL > 40 mg.    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 However, his shortness of breath limits that    Intervention  Offer individual and/or small group education and counseling on adjustment to heart disease, stress management and health-related lifestyle change. Teach and support self-help strategies.;Refer participants experiencing significant psychosocial distress to appropriate mental health specialists for further evaluation and treatment. When possible, include family members and significant others in education/counseling sessions.     Expected Outcomes  Short Term: Participant demonstrates changes in health-related behavior, relaxation and other stress management skills, ability to obtain effective social support, and compliance with psychotropic medications if prescribed.;Long Term: Emotional wellbeing is indicated by absence of clinically significant psychosocial distress or social isolation.       Core Components/Risk Factors/Patient Goals Review:  Goals and Risk Factor Review    Row Name 01/01/18 0948 01/27/18 0913           Core Components/Risk Factors/Patient Goals Review   Personal Goals Review  Weight Management/Obesity;Develop more efficient breathing techniques such as purse lipped breathing and diaphragmatic breathing and practicing self-pacing with activity.;Stress;Hypertension;Lipids  Lipids;Hypertension      Review  ChaEduard Clos taking all meds as directed.  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  He continues to avoid fried foods and drinks water and unsweet tea.  He still says he struggles with breathing due to COPD during strength training.  He does use PLB.  He syas he has no worries and is at peace with himself.  Pt taking meds as directed.  BP has been good at home and in HT class.  He checks 02 at home as well.  Has been on prednisone and antibiotics past 2 weeks for bronchitis.      Expected Outcomes  Short - ChaEduard Closll continue attending HT and making healthy choices  Long - ChaEduard Closll maintain fitness and healthy lifestyle choices  Short - Pt will continue meds and exercise to maintain health and fitness Long - Pt will maintain exercise and healthy habits long term         Core Components/Risk Factors/Patient Goals at Discharge (Final Review):  Goals and Risk Factor Review - 01/27/18 0913      Core Components/Risk Factors/Patient Goals Review   Personal Goals Review  Lipids;Hypertension    Review  Pt taking meds as directed.  BP has been good at home and in HT class.  He checks 02 at  home as well.  Has been on prednisone and antibiotics past 2 weeks for bronchitis.    Expected Outcomes  Short - Pt will continue meds and exercise to maintain health and fitness Long - Pt will maintain exercise and healthy habits long term       ITP Comments: ITP Comments    Row Name 12/01/17 1241 12/17/17 0624 01/14/18 0541 01/20/18  1622 02/11/18 0556   ITP Comments  Med Review completed. Initial ITP created. Diagnosis [facility] found in [facility] 10/06/17  30 Day review. Continue with ITP unless directed changes per Medical Director review.   First day 3/21  30 day review. Continue with ITP unless directed changes per Medical Director  Called to check on status of return.  He has been out with bronchitis and on second round of antibiotics and prednisone.  Today was the first day he was even able to get out of the house.  He is still coughing.  He wants to return and will when he is able.    30 day review. Continue with ITP unless directed changes per Medical Director   [place] Name 02/24/18 [phone]           ITP Comments  Discharge ITP sent and signed by Dr. Sabra Heck.  Discharge Summary routed to PCP and cardiologist.          Comments: Discharge ITP

## 2018-02-24 NOTE — Progress Notes (Signed)
Discharge Progress Report  Patient Details  Name: Benjamin Frost MRN: [id number] Date of Birth: 1941-05-10 Referring Provider:     Cardiac Rehab from 12/01/2017 in [facility]  Referring Provider  Kathlyn Sacramento MD [Dr. Sherren Mocha (TAVR)]       Number of Visits: 36/36  Reason for Discharge:  Patient reached a stable level of exercise. Patient independent in their exercise. Patient has met program and personal goals.  Smoking History:  Social History   Tobacco Use  Smoking Status Former Smoker  . Years: 0.00  . Types: Cigarettes  . Last attempt to quit: 1992  . Years since quitting: 27.4  Smokeless Tobacco Former Systems developer  . Types: Snuff  Tobacco Comment   quit 25 years ago    Diagnosis:  S/P TAVR (transcatheter aortic valve replacement)  ADL [facility]:   Initial Exercise Prescription: Initial Exercise Prescription - 12/01/17 1500      Date of Initial Exercise RX and Referring Provider   Date  12/01/17    Referring Provider  Kathlyn Sacramento MD Dr. Sherren Mocha (TAVR)      Treadmill   MPH  1.4    Grade  0.5    Minutes  15    METs  2.17      REL-XR   Level  1    Speed  50    Minutes  15    METs  2      T5 Nustep   Level  1    SPM  80    Minutes  15    METs  2      Prescription Details   Frequency (times per week)  2    Duration  Progress to 30 minutes of continuous aerobic without signs/symptoms of physical distress      Intensity   THRR 40-80% of Max Heartrate  101-128    Ratings of Perceived Exertion  11-13    Perceived Dyspnea  0-4      Progression   Progression  Continue to progress workloads to maintain intensity without signs/symptoms of physical distress.      Resistance Training   Training Prescription  Yes    Weight  3 lbs    Reps  10-15       Discharge Exercise Prescription (Final Exercise Prescription Changes): Exercise Prescription Changes - 02/17/18 1400      Response to Exercise   Blood Pressure  (Admit)  126/54    Blood Pressure (Exercise)  134/72    Blood Pressure (Exit)  130/70    Heart Rate (Admit)  67 bpm    Heart Rate (Exercise)  108 bpm    Heart Rate (Exit)  76 bpm    Rating of Perceived Exertion (Exercise)  13    Symptoms  none    Duration  Continue with 30 min of aerobic exercise without signs/symptoms of physical distress.    Intensity  THRR unchanged      Progression   Progression  Continue to progress workloads to maintain intensity without signs/symptoms of physical distress.    Average METs  2.87      Resistance Training   Training Prescription  Yes    Weight  4 lbs    Reps  10-15      Interval Training   Interval Training  No      Treadmill   MPH  1.8    Grade  0.5    Minutes  15    METs  2.5      REL-XR   Level  3    Minutes  15    METs  4      T5 Nustep   Level  3    Minutes  15    METs  2.1      Home Exercise Plan   Plans to continue exercise at  [facility] (comment) walking and the [facility]    Frequency  Add 2 additional days to program exercise sessions.    Initial Home Exercises Provided  12/23/17       Functional Capacity: 6 Minute Walk    Row Name 12/01/17 1527 02/12/18 1020       6 Minute Walk   Phase  Initial  Discharge    Distance  1035 feet  1268 feet    Distance % Change  -  22.5 %    Distance Feet Change  -  233 ft    Walk Time  6 minutes  6 minutes    # of Rest Breaks  0  0    MPH  1.96  2.4    METS  2.06  2.29    RPE  13  13    Perceived Dyspnea   3  3    VO2 Peak  7.2  8.02    Symptoms  Yes (comment)  Yes (comment)    Comments  SOB  SOB    Resting HR  73 bpm  64 bpm    Resting BP  144/74  130/70    Resting Oxygen Saturation   95 %  -    Exercise Oxygen Saturation  during 6 min walk  96 %  -    Max Ex. HR  98 bpm  96 bpm    Max Ex. BP  156/74  134/74    2 Minute Post BP  146/72  -      Interval HR   1 Minute HR  97  -    2 Minute HR  97  -    3 Minute HR  96  -    4 Minute HR  97  -    5 Minute HR   98  -    6 Minute HR  96  -    2 Minute Post HR  75  -    Interval Heart Rate?  Yes  -      Interval Oxygen   Interval Oxygen?  Yes  -    Baseline Oxygen Saturation %  95 %  -    1 Minute Oxygen Saturation %  92 %  -    1 Minute Liters of Oxygen  0 L Room Air  -    2 Minute Oxygen Saturation %  90 %  -    2 Minute Liters of Oxygen  0 L  -    3 Minute Oxygen Saturation %  91 %  -    3 Minute Liters of Oxygen  0 L  -    4 Minute Oxygen Saturation %  91 %  -    4 Minute Liters of Oxygen  0 L  -    5 Minute Oxygen Saturation %  91 %  -    5 Minute Liters of Oxygen  0 L  -    6 Minute Oxygen Saturation %  91 %  -    6 Minute Liters of Oxygen  0 L  -  2 Minute Post Oxygen Saturation %  96 %  -    2 Minute Post Liters of Oxygen  0 L  -       Psychological, QOL, Others - Outcomes: PHQ 2/9: Depression screen [facility] 2/9 02/19/2018 12/01/2017  Decreased Interest 0 0  Down, Depressed, Hopeless 0 0  PHQ - 2 Score 0 0  Altered sleeping 0 0  Tired, decreased energy 0 0  Change in appetite 0 0  Feeling bad or failure about yourself  0 0  Trouble concentrating 0 0  Moving slowly or fidgety/restless 0 0  Suicidal thoughts 0 0  PHQ-9 Score 0 0    Quality of Life: Quality of Life - 02/19/18 1203      Quality of Life Scores   Health/Function Pre  24.7 %    Health/Function Post  25.9 %    Health/Function % Change  4.86 %    Socioeconomic Pre  30 %    Socioeconomic Post  27.42 %    Socioeconomic % Change   -8.6 %    Psych/Spiritual Pre  29.14 %    Psych/Spiritual Post  29.29 %    Psych/Spiritual % Change  0.51 %    Family Pre  30 %    Family Post  28.5 %    Family % Change  -5 %    GLOBAL Pre  27.49 %    GLOBAL Post  27.29 %    GLOBAL % Change  -0.73 %       Personal Goals: Goals established at orientation with interventions provided to work toward goal. Personal Goals and Risk Factors at Admission - 12/01/17 1247      Core Components/Risk Factors/Patient Goals on Admission     Weight Management  Yes;Weight Loss    Intervention  Weight Management: Develop a combined nutrition and exercise program designed to reach desired caloric intake, while maintaining appropriate intake of nutrient and fiber, sodium and fats, and appropriate energy expenditure required for the weight goal.;Weight Management: Provide education and appropriate resources to help participant work on and attain dietary goals.;Weight Management/Obesity: Establish reasonable short term and long term weight goals.    Admit Weight  193 lb (87.5 kg) at home, weighs every morning; 202 lb on CR's scale    Goal Weight: Short Term  188 lb (85.3 kg)    Goal Weight: Long Term  185 lb (83.9 kg)    Expected Outcomes  Short Term: Continue to assess and modify interventions until short term weight is achieved;Long Term: Adherence to nutrition and physical activity/exercise program aimed toward attainment of established weight goal;Weight Loss: Understanding of general recommendations for a balanced deficit meal plan, which promotes 1-2 lb weight loss per week and includes a negative energy balance of [phone] kcal/d;Understanding recommendations for meals to include 15-35% energy as protein, 25-35% energy from fat, 35-60% energy from carbohydrates, less than 255m of dietary cholesterol, 20-35 gm of total fiber daily;Understanding of distribution of calorie intake throughout the day with the consumption of 4-5 meals/snacks    Heart Failure  Yes    Intervention  Provide a combined exercise and nutrition program that is supplemented with education, support and counseling about heart failure. Directed toward relieving symptoms such as shortness of breath, decreased exercise tolerance, and extremity edema.    Expected Outcomes  Improve functional capacity of life;Short term: Attendance in program 2-3 days a week with increased exercise capacity. Reported lower sodium intake. Reported increased fruit and vegetable intake. Reports  medication compliance.;Short term: Daily weights obtained and reported for increase. Utilizing diuretic protocols set by physician.;Long term: Adoption of self-care skills and reduction of barriers for early signs and symptoms recognition and intervention leading to self-care maintenance.    Hypertension  Yes    Intervention  Provide education on lifestyle modifcations including regular physical activity/exercise, weight management, moderate sodium restriction and increased consumption of fresh fruit, vegetables, and low fat dairy, alcohol moderation, and smoking cessation.;Monitor prescription use compliance.    Expected Outcomes  Short Term: Continued assessment and intervention until BP is < 140/62m HG in hypertensive participants. < 130/850mHG in hypertensive participants with diabetes, heart failure or chronic kidney disease.;Long Term: Maintenance of blood pressure at goal levels.    Lipids  Yes    Intervention  Provide education and support for participant on nutrition & aerobic/resistive exercise along with prescribed medications to achieve LDL <7020mHDL >49m76m  Expected Outcomes  Short Term: Participant states understanding of desired cholesterol values and is compliant with medications prescribed. Participant is following exercise prescription and nutrition guidelines.;Long Term: Cholesterol controlled with medications as prescribed, with individualized exercise RX and with personalized nutrition plan. Value goals: LDL < 70mg68mL > 40 mg.    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 However, his shortness of breath limits that    Intervention  Offer individual and/or small group education and counseling on adjustment to heart disease, stress management and health-related lifestyle change. Teach and support self-help strategies.;Refer participants experiencing significant psychosocial distress to appropriate mental  health specialists for further evaluation and treatment. When possible, include family members and significant others in education/counseling sessions.    Expected Outcomes  Short Term: Participant demonstrates changes in health-related behavior, relaxation and other stress management skills, ability to obtain effective social support, and compliance with psychotropic medications if prescribed.;Long Term: Emotional wellbeing is indicated by absence of clinically significant psychosocial distress or social isolation.        Personal Goals Discharge: Goals and Risk Factor Review    Row Name 01/01/18 0948 01/27/18 0913           Core Components/Risk Factors/Patient Goals Review   Personal Goals Review  Weight Management/Obesity;Develop more efficient breathing techniques such as purse lipped breathing and diaphragmatic breathing and practicing self-pacing with activity.;Stress;Hypertension;Lipids  Lipids;Hypertension      Review  Benjamin Closaking all meds as directed.  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  He continues to avoid fried foods and drinks water and unsweet tea.  He still says he struggles with breathing due to COPD during strength training.  He does use PLB.  He syas he has no worries and is at peace with himself.  Pt taking meds as directed.  BP has been good at home and in HT class.  He checks 02 at home as well.  Has been on prednisone and antibiotics past 2 weeks for bronchitis.      Expected Outcomes  Short - Benjamin Frost continue attending HT and making healthy choices  Long - Benjamin Frost maintain fitness and healthy lifestyle choices  Short - Pt will continue meds and exercise to maintain health and fitness Long - Pt will maintain exercise and healthy habits long term         Exercise Goals and Review: Exercise Goals    Row Name 12/01/17 1531  Exercise Goals   Increase Physical Activity  Yes       Intervention  Provide advice, education,  support and counseling about physical activity/exercise needs.;Develop an individualized exercise prescription for aerobic and resistive training based on initial evaluation findings, risk stratification, comorbidities and participant's personal goals.       Expected Outcomes  Short Term: Attend rehab on a regular basis to increase amount of physical activity.;Long Term: Add in home exercise to make exercise part of routine and to increase amount of physical activity.;Long Term: Exercising regularly at least 3-5 days a week.       Increase Strength and Stamina  Yes       Intervention  Provide advice, education, support and counseling about physical activity/exercise needs.;Develop an individualized exercise prescription for aerobic and resistive training based on initial evaluation findings, risk stratification, comorbidities and participant's personal goals.       Expected Outcomes  Short Term: Increase workloads from initial exercise prescription for resistance, speed, and METs.;Short Term: Perform resistance training exercises routinely during rehab and add in resistance training at home;Long Term: Improve cardiorespiratory fitness, muscular endurance and strength as measured by increased METs and functional capacity (6MWT)       Able to understand and use rate of perceived exertion (RPE) scale  Yes       Intervention  Provide education and explanation on how to use RPE scale       Expected Outcomes  Short Term: Able to use RPE daily in rehab to express subjective intensity level;Long Term:  Able to use RPE to guide intensity level when exercising independently       Able to understand and use Dyspnea scale  Yes       Intervention  Provide education and explanation on how to use Dyspnea scale       Expected Outcomes  Short Term: Able to use Dyspnea scale daily in rehab to express subjective sense of shortness of breath during exertion;Long Term: Able to use Dyspnea scale to guide intensity level when  exercising independently       Knowledge and understanding of Target Heart Rate Range (THRR)  Yes       Intervention  Provide education and explanation of THRR including how the numbers were predicted and where they are located for reference       Expected Outcomes  Short Term: Able to state/look up THRR;Long Term: Able to use THRR to govern intensity when exercising independently;Short Term: Able to use daily as guideline for intensity in rehab       Able to check pulse independently  Yes       Intervention  Provide education and demonstration on how to check pulse in carotid and radial arteries.;Review the importance of being able to check your own pulse for safety during independent exercise       Expected Outcomes  Short Term: Able to explain why pulse checking is important during independent exercise;Long Term: Able to check pulse independently and accurately       Understanding of Exercise Prescription  Yes       Intervention  Provide education, explanation, and written materials on patient's individual exercise prescription       Expected Outcomes  Short Term: Able to explain program exercise prescription;Long Term: Able to explain home exercise prescription to exercise independently          Nutrition & Weight - Outcomes: Pre Biometrics - 12/01/17 1532      Pre Biometrics  Height  5' 9.1" (1.755 m)    Weight  202 lb 4.8 oz (91.8 kg)    Waist Circumference  39.5 inches    Hip Circumference  44 inches    Waist to Hip Ratio  0.9 %    BMI (Calculated)  29.79    Single Leg Stand  4.8 seconds        Nutrition: Nutrition Therapy & Goals - 01/01/18 1027      Nutrition Therapy   Diet  TLC    Drug/Food Interactions  Statins/Certain Fruits    Protein (specify units)  8-9oz    Fiber  30 grams    Whole Grain Foods  3 servings    Saturated Fats  14 max. grams    Fruits and Vegetables  6 servings/day 8 ideal    Sodium  2000 grams 1500 ideal      Personal Nutrition Goals    Nutrition Goal  Increase intake of fruits consumed daily    Personal Goal #2  Consume sweets / desserts in moderation    Comments  he currently watches his sodium intake, eats a variety of vegetables and lean meats, and eats out 1x/month      Intervention Plan   Intervention  Prescribe, educate and counsel regarding individualized specific dietary modifications aiming towards targeted core components such as weight, hypertension, lipid management, diabetes, heart failure and other comorbidities.    Expected Outcomes  Short Term Goal: Understand basic principles of dietary content, such as calories, fat, sodium, cholesterol and nutrients.;Short Term Goal: A plan has been developed with personal nutrition goals set during dietitian appointment.;Long Term Goal: Adherence to prescribed nutrition plan.       Nutrition Discharge: Nutrition Assessments - 02/19/18 1203      MEDFICTS Scores   Pre Score  49    Post Score  58    Score Difference  9       Education Questionnaire Score: Knowledge Questionnaire Score - 02/19/18 1202      Knowledge Questionnaire Score   Pre Score  19/28    Post Score  24/28       Goals reviewed with patient; copy given to patient.

## 2018-02-27 ENCOUNTER — Encounter: Payer: Self-pay | Admitting: Cardiovascular Disease

## 2018-02-27 ENCOUNTER — Ambulatory Visit: Payer: Medicare Other | Admitting: Cardiovascular Disease

## 2018-02-27 VITALS — BP 128/84 | HR 72 | Ht 70.0 in | Wt 201.0 lb

## 2018-02-27 DIAGNOSIS — Z952 Presence of prosthetic heart valve: Secondary | ICD-10-CM | POA: Diagnosis not present

## 2018-02-27 DIAGNOSIS — I5022 Chronic systolic (congestive) heart failure: Secondary | ICD-10-CM | POA: Diagnosis not present

## 2018-02-27 DIAGNOSIS — E785 Hyperlipidemia, unspecified: Secondary | ICD-10-CM

## 2018-02-27 MED ORDER — CARVEDILOL 6.25 MG PO TABS: 6.2500 mg | ORAL_TABLET | Freq: Two times a day (BID) | ORAL | 1 refills | Status: DC

## 2018-02-27 NOTE — Progress Notes (Signed)
Cardiology Office Note   Date:  02/27/2018   ID:  Benjamin Frost, DOB [birth year], MRN [id number]  PCP:  Rory Percy, MD  Cardiologist:   Lorine Bears, MD   Chief Complaint  Patient presents with  . OTHER    3 month f/u no compliants today requesting Corlanor Samples due to cost. Meds reviewed verbally with pt.      History of Present Illness: Benjamin Frost is a 77 y.o. male who presents for a followup visit regarding chronic systolic heart failure with severely reduced LV systolic function, aortic stenosis and an underlying left bundle branch block. Previous ejection fraction was 10-15% in 2015. Right and  left cardiac catheterization in July, 2015 showed only mildly elevated filling pressures with normal cardiac output and no significant pulmonary hypertension. Coronary angiography showed no obstructive coronary artery disease. He had significant sinus tachycardia that improved significantly with Ivabradine.  He is status post ICD CRT placement.  He is also status post TAVR in February of this year.  Echocardiogram in March showed improvement in ejection fraction to 45-50% with normal functioning aortic valve.   He has been doing well and attending cardiac rehab. He has been doing extremely well with no recent chest pain, shortness of breath or palpitations.  I discontinued digoxin during last office visit. He is having hard time affording ivabradine.   Past Medical History:  Diagnosis Date  . AICD (automatic cardioverter/defibrillator) present    a. St Jude, placed after VF arrest during a dobuatamine echo.   . Aortic stenosis    a. mod-severe low flow low gradient AS.   Marland Kitchen Aortic stenosis, severe    a. 11/04/17: s/p TAVR by [name] and [name].   . Chronic systolic heart failure (HCC)   . COPD (chronic obstructive pulmonary disease) (HCC)   . GERD (gastroesophageal reflux disease)   . History of BPH   . History of kidney stones   . Hyperlipidemia   .  Hypertension   . Left bundle branch block   . S/P TAVR (transcatheter aortic valve replacement)    a. 10/2017: s/p TAVR with an Edwards Sapien 3 THV (size 26 mm, model # 9600TFX, serial # [id number])  . Sinus tachycardia     Past Surgical History:  Procedure Laterality Date  . 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 CARDIAC CATHETERIZATION  2012   [facility]  . CARDIAC CATHETERIZATION     [facility]  . CATARACT EXTRACTION, BILATERAL    . LEFT AND RIGHT HEART CATHETERIZATION WITH CORONARY ANGIOGRAM N/A 03/23/2014   Procedure: LEFT AND RIGHT HEART CATHETERIZATION WITH CORONARY ANGIOGRAM;  Surgeon: [name], MD;  Location: [facility] C[facility];  Service: Cardiovascular;  Laterality: N/A;  . LEG SURGERY Left    surgery d/t injury from nail gun  . RIGHT/LEFT HEART CATH AND CORONARY ANGIOGRAPHY N/A 05/16/2017   Procedure: RIGHT/LEFT HEART CATH AND CORONARY ANGIOGRAPHY;  Surgeon: [name], MD;  Location: [facility];  Service: Cardiovascular;  Laterality: N/A;  . TEE WITHOUT CARDIOVERSION N/A 11/04/2017   Procedure: TRANSESOPHAGEAL ECHOCARDIOGRAM (TEE);  Surgeon: [name], MD;  Location: [facility] OR;  Service: Open Heart Surgery;  Laterality: N/A;  . TRANSCATHETER AORTIC VALVE REPLACEMENT, TRANSFEMORAL N/A 11/04/2017   Procedure: TRANSCATHETER AORTIC VALVE REPLACEMENT, TRANSFEMORAL;  Surgeon: [name], MD;  Location: [facility] OR;  Service: Open Heart Surgery;  Laterality: N/A;  Current Outpatient Medications  Medication Sig Dispense Refill  . albuterol (PROAIR HFA) 108 (90 BASE) MCG/ACT inhaler Inhale 2 puffs into the lungs every 6 (six) hours as needed for wheezing or shortness of breath.    Marland Kitchen amoxicillin (AMOXIL) 500 MG tablet Take 2,000 mg (four 500 mg tablets) one hour prior to dental appointments. 4 tablet 6  . aspirin 81 MG tablet Take 81 mg by mouth daily.    Marland Kitchen atorvastatin (LIPITOR) 40 MG tablet Take 40  mg by mouth daily.    . carvedilol (COREG) 3.125 MG tablet Take 1 tablet (3.125 mg total) by mouth 2 (two) times daily. 60 tablet 3  . clopidogrel (PLAVIX) 75 MG tablet Take 1 tablet (75 mg total) by mouth daily with breakfast. 30 tablet 5  . Fluticasone-Salmeterol (ADVAIR) 250-50 MCG/DOSE AEPB Inhale 1 puff into the lungs 2 (two) times daily.    . ivabradine (CORLANOR) 5 MG TABS tablet Take 1 tablet (5 mg total) by mouth 2 (two) times daily with a meal. 60 tablet 2  . losartan (COZAAR) 25 MG tablet Take 1 tablet (25 mg total) by mouth daily. 30 tablet 3  . pantoprazole (PROTONIX) 40 MG tablet Take 1 tablet (40 mg total) by mouth daily. 30 tablet 6  . spironolactone (ALDACTONE) 25 MG tablet Take 1 tablet (25 mg total) by mouth daily. 30 tablet 0  . tamsulosin (FLOMAX) 0.4 MG CAPS capsule Take 0.4 mg by mouth daily.    Marland Kitchen tiotropium (SPIRIVA) 18 MCG inhalation capsule Place 18 mcg into inhaler and inhale daily.     No current facility-administered medications for this visit.     Allergies:   Lisinopril; Amlodipine; Dobutamine; and Metoprolol    Social History:  The patient  reports that he quit smoking about 27 years ago. His smoking use included cigarettes. He quit after 0.00 years of use. He has quit using smokeless tobacco. His smokeless tobacco use included snuff. He reports that he drinks alcohol. He reports that he does not use drugs.   Family History:  The patient's family history includes Heart disease in his father; Heart failure in his father; Hyperlipidemia in his mother; Hypertension in his mother.    ROS:  Please see the history of present illness.   Otherwise, review of systems are positive for none.   All other systems are reviewed and negative.    PHYSICAL EXAM: VS:  BP 128/84 (BP Location: Left Arm, Patient Position: Sitting, Cuff Size: Normal)   Pulse 72   Ht 5\' 10"  (1.778 m)   Wt 201 lb (91.2 kg)   BMI 28.84 kg/m  , BMI Body mass index is 28.84 kg/m. GEN: Well  nourished, well developed, in no acute distress  HEENT: normal  Neck: no JVD, carotid bruits, or masses Cardiac: RRR; no  rubs, or gallops,no edema . There is 1/6 systolic murmur in the aortic area . Respiratory: Clear to auscultation, normal work of breathing GI: soft, nontender, nondistended, + BS MS: no deformity or atrophy  Skin: warm and dry, no rash Neuro:  Strength and sensation are intact Psych: euthymic mood, full affect   EKG:  EKG is ordered today. The ekg ordered today demonstrates atrial sensed ventricular paced rhythm .   Recent Labs: 10/31/2017: ALT 14; B Natriuretic Peptide 23.9 11/05/2017: BUN 10; Creatinine, Ser 0.70; Hemoglobin 12.9; Magnesium 1.9; Platelets 90; Potassium 4.0; Sodium 138    Lipid Panel    Component Value Date/Time   CHOL 117 02/28/2014 0442  TRIG 65 02/28/2014 0442   HDL 44 02/28/2014 0442   VLDL 13 02/28/2014 0442   LDLCALC 60 02/28/2014 0442      Wt Readings from Last 3 Encounters:  02/27/18 201 lb (91.2 kg)  12/09/17 203 lb (92.1 kg)  12/01/17 202 lb 4.8 oz (91.8 kg)       ASSESSMENT AND PLAN:  1.  Chronic systolic heart failure: Due to nonischemic cardiomyopathy.  He is currently New York Heart Association class II.  Most recent ejection fraction was 45-50%.  He is having hard time affording ivabradine.  Given improvement in his status and sinus tachycardia, I elected to discontinue ivabradine today and increase carvedilol to 6.25 mg twice daily.  If there is recurrent sinus tachycardia, we can always consider resuming ivabradine.  Continue small dose losartan and spironolactone.  He appears to be euvolemic without a loop diuretic.  2.  Status post TAVR  for severe aortic stenosis: Stable.  Most recent echo showed normal functioning prosthesis.  3. Hyperlipidemia: Currently on atorvastatin.  4.  Status post ICD CRT: Followed by the EP clinic.  EKG today shows normal functioning pacemaker.   Disposition:   FU with me in 3  months  Signed,  Lorine Bears, MD  02/27/2018 10:05 AM    [facility]

## 2018-02-27 NOTE — Patient Instructions (Signed)
Medication Instructions: INCREASE Carvedilol to 6.25 twice daily STOP the Corlanor  If you need a refill on your cardiac medications before your next appointment, please call your pharmacy.    Follow-Up: Your physician wants you to follow-up in 3 months with [name].    Thank you for choosing [facility] at [facility]!

## 2018-03-11 ENCOUNTER — Ambulatory Visit (INDEPENDENT_AMBULATORY_CARE_PROVIDER_SITE_OTHER): Payer: Medicare Other | Admitting: *Deleted

## 2018-03-11 DIAGNOSIS — I5022 Chronic systolic (congestive) heart failure: Secondary | ICD-10-CM

## 2018-03-11 DIAGNOSIS — I428 Other cardiomyopathies: Secondary | ICD-10-CM

## 2018-03-11 NOTE — Progress Notes (Signed)
Remote pacemaker transmission.   

## 2018-03-31 LAB — CUP PACEART REMOTE DEVICE CHECK
Battery Remaining Longevity: 68 mo
Brady Statistic AP VS Percent: 1 %
Brady Statistic RA Percent Paced: 5.1 %
HIGH POWER IMPEDANCE MEASURED VALUE: 91 Ohm
HIGH POWER IMPEDANCE MEASURED VALUE: 91 Ohm
Implantable Lead Implant Date: 20180906
Implantable Lead Implant Date: 20180906
Implantable Lead Location: 753859
Implantable Lead Location: 753860
Implantable Pulse Generator Implant Date: 20180906
Lead Channel Impedance Value: 1175 Ohm
Lead Channel Pacing Threshold Amplitude: 0.5 V
Lead Channel Pacing Threshold Amplitude: 0.75 V
Lead Channel Pacing Threshold Amplitude: 2.125 V
Lead Channel Pacing Threshold Pulse Width: 0.5 ms
Lead Channel Sensing Intrinsic Amplitude: 11.7 mV
Lead Channel Setting Pacing Amplitude: 3.125
Lead Channel Setting Pacing Pulse Width: 0.5 ms
Lead Channel Setting Sensing Sensitivity: 0.5 mV
MDC IDC LEAD IMPLANT DT: 20180906
MDC IDC LEAD LOCATION: 753858
MDC IDC MSMT BATTERY REMAINING PERCENTAGE: 83 %
MDC IDC MSMT BATTERY VOLTAGE: 2.99 V
MDC IDC MSMT LEADCHNL LV PACING THRESHOLD PULSEWIDTH: 0.5 ms
MDC IDC MSMT LEADCHNL RA IMPEDANCE VALUE: 560 Ohm
MDC IDC MSMT LEADCHNL RA SENSING INTR AMPL: 3.9 mV
MDC IDC MSMT LEADCHNL RV IMPEDANCE VALUE: 560 Ohm
MDC IDC MSMT LEADCHNL RV PACING THRESHOLD PULSEWIDTH: 0.5 ms
MDC IDC SESS DTM: 20190619095942
MDC IDC SET LEADCHNL RA PACING AMPLITUDE: 2 V
MDC IDC SET LEADCHNL RV PACING AMPLITUDE: 2.5 V
MDC IDC SET LEADCHNL RV PACING PULSEWIDTH: 0.5 ms
MDC IDC STAT BRADY AP VP PERCENT: 6.3 %
MDC IDC STAT BRADY AS VP PERCENT: 90 %
MDC IDC STAT BRADY AS VS PERCENT: 1.7 %
Pulse Gen Serial Number: 7421078

## 2018-04-30 ENCOUNTER — Other Ambulatory Visit: Payer: Self-pay | Admitting: *Deleted

## 2018-04-30 MED ORDER — CLOPIDOGREL BISULFATE 75 MG PO TABS: 75.0000 mg | ORAL_TABLET | Freq: Every day | ORAL | 3 refills | Status: DC

## 2018-06-04 ENCOUNTER — Encounter: Payer: Self-pay | Admitting: Cardiovascular Disease

## 2018-06-04 ENCOUNTER — Ambulatory Visit: Payer: Medicare Other | Admitting: Cardiovascular Disease

## 2018-06-04 VITALS — BP 120/60 | HR 79 | Ht 70.0 in | Wt 206.0 lb

## 2018-06-04 DIAGNOSIS — I10 Essential (primary) hypertension: Secondary | ICD-10-CM

## 2018-06-04 DIAGNOSIS — E785 Hyperlipidemia, unspecified: Secondary | ICD-10-CM | POA: Diagnosis not present

## 2018-06-04 DIAGNOSIS — Z952 Presence of prosthetic heart valve: Secondary | ICD-10-CM

## 2018-06-04 DIAGNOSIS — I359 Nonrheumatic aortic valve disorder, unspecified: Secondary | ICD-10-CM | POA: Diagnosis not present

## 2018-06-04 DIAGNOSIS — I5022 Chronic systolic (congestive) heart failure: Secondary | ICD-10-CM | POA: Diagnosis not present

## 2018-06-04 NOTE — Patient Instructions (Addendum)
Medication Instructions:  Your physician recommends that you continue on your current medications as directed. Please refer to the Current Medication list given to you today.   Labwork: Your physician recommends that you return for lab work in: TODAY - BMP, LIPID, LIVER.   Testing/Procedures: Your physician has requested that you have an echocardiogram. Echocardiography is a painless test that uses sound waves to create images of your heart. It provides your doctor with information about the size and shape of your heart and how well your heart's chambers and valves are working. This procedure takes approximately one hour. There are no restrictions for this procedure. You may get an IV, if needed, to receive an ultrasound enhancing agent through to better visualize your heart.    Follow-Up: Your physician recommends that you schedule a follow-up appointment in: 6 MONTHS WITH DR ARIDA.   If you need a refill on your cardiac medications before your next appointment, please call your pharmacy.    Echocardiogram An echocardiogram, or echocardiography, uses sound waves (ultrasound) to produce an image of your heart. The echocardiogram is simple, painless, obtained within a short period of time, and offers valuable information to your health care provider. The images from an echocardiogram can provide information such as:  Evidence of coronary artery disease (CAD).  Heart size.  Heart muscle function.  Heart valve function.  Aneurysm detection.  Evidence of a past heart attack.  Fluid buildup around the heart.  Heart muscle thickening.  Assess heart valve function.  Tell a health care provider about:  Any allergies you have.  All medicines you are taking, including vitamins, herbs, eye drops, creams, and over-the-counter medicines.  Any problems you or family members have had with anesthetic medicines.  Any blood disorders you have.  Any surgeries you have had.  Any  medical conditions you have.  Whether you are pregnant or may be pregnant. What happens before the procedure? No special preparation is needed. Eat and drink normally. What happens during the procedure?  In order to produce an image of your heart, gel will be applied to your chest and a wand-like tool (transducer) will be moved over your chest. The gel will help transmit the sound waves from the transducer. The sound waves will harmlessly bounce off your heart to allow the heart images to be captured in real-time motion. These images will then be recorded.  You may need an IV to receive a medicine that improves the quality of the pictures. What happens after the procedure? You may return to your normal schedule including diet, activities, and medicines, unless your health care provider tells you otherwise. This information is not intended to replace advice given to you by your health care provider. Make sure you discuss any questions you have with your health care provider. Document Released: 09/06/2000 Document Revised: 04/27/2016 Document Reviewed: 05/17/2013 Elsevier Interactive Patient Education  2017 [employer].

## 2018-06-04 NOTE — Progress Notes (Signed)
Cardiology Office Note   Date:  06/04/2018   ID:  [name], DOB 02-03-1941, MRN [id number]  PCP:  Rory Percy, MD  Cardiologist:   Lorine Bears, MD   Chief Complaint  Patient presents with  . other    3 month follow up. Meds reviewed by the pt. verbally. "doing well."       History of Present Illness: Benjamin Frost is a 78 y.o. male who presents for a followup visit regarding chronic systolic heart failure with severely reduced LV systolic function, aortic stenosis and an underlying left bundle branch block. Previous ejection fraction was 10-15% in 2015. Right and  left cardiac catheterization in July, 2015 showed only mildly elevated filling pressures with normal cardiac output and no significant pulmonary hypertension. Coronary angiography showed no obstructive coronary artery disease. He had significant sinus tachycardia that improved significantly with Ivabradine.  He is status post ICD CRT placement.  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   Echocardiogram in March, 2019 showed improvement in ejection fraction to 45-50% with normal functioning aortic valve.    During last visit, I discontinued ivabradine due to cost reasons.  I did increase carvedilol to 6.25 mg twice daily.  He has been doing well with no recent chest pain or shortness of breath.  No palpitations.  He had recent bronchitis that improved with short course of prednisone and doxycycline.  Past Medical History:  Diagnosis Date  . AICD (automatic cardioverter/defibrillator) present    a. St Jude, placed after VF arrest during a dobuatamine echo.   . Aortic stenosis    a. mod-severe low flow low gradient AS.   Marland Kitchen Aortic stenosis, severe    a. 11/04/17: s/p TAVR by [name] and [name].   . Chronic systolic heart failure (HCC)   . COPD (chronic obstructive pulmonary disease) (HCC)   . GERD (gastroesophageal reflux disease)   . History of BPH   . History of kidney stones   .  Hyperlipidemia   . Hypertension   . Left bundle branch block   . S/P TAVR (transcatheter aortic valve replacement)    a. 10/2017: s/p TAVR with an Edwards Sapien 3 THV (size 26 mm, model # 9600TFX, serial # [id number])  . Sinus tachycardia     Past Surgical History:  Procedure Laterality Date  . 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 CARDIAC CATHETERIZATION  2012   [facility]  . CARDIAC CATHETERIZATION     [facility]  . CATARACT EXTRACTION, BILATERAL    . LEFT AND RIGHT HEART CATHETERIZATION WITH CORONARY ANGIOGRAM N/A 03/23/2014   Procedure: LEFT AND RIGHT HEART CATHETERIZATION WITH CORONARY ANGIOGRAM;  Surgeon: [name], MD;  Location: [facility] CATH [facility];  Service: Cardiovascular;  Laterality: N/A;  . LEG SURGERY Left    surgery d/t injury from nail gun  . RIGHT/LEFT HEART CATH AND CORONARY ANGIOGRAPHY N/A 05/16/2017   Procedure: RIGHT/LEFT HEART CATH AND CORONARY ANGIOGRAPHY;  Surgeon: [name], MD;  Location: [facility] INVASIVE CV LAB;  Service: Cardiovascular;  Laterality: N/A;  . TEE WITHOUT CARDIOVERSION N/A 11/04/2017   Procedure: TRANSESOPHAGEAL ECHOCARDIOGRAM (TEE);  Surgeon: [name], MD;  Location: [facility] OR;  Service: Open Heart Surgery;  Laterality: N/A;  . TRANSCATHETER AORTIC VALVE REPLACEMENT, TRANSFEMORAL N/A 11/04/2017   Procedure: TRANSCATHETER AORTIC VALVE REPLACEMENT, TRANSFEMORAL;  Surgeon: [name], MD;  Location: [facility] OR;  Service:  Open Heart Surgery;  Laterality: N/A;     Current Outpatient Medications  Medication Sig Dispense Refill  . albuterol (PROAIR HFA) 108 (90 BASE) MCG/ACT inhaler Inhale 2 puffs into the lungs every 6 (six) hours as needed for wheezing or shortness of breath.    Marland Kitchen amoxicillin (AMOXIL) 500 MG tablet Take 2,000 mg (four 500 mg tablets) one hour prior to dental appointments. 4 tablet 6  . aspirin 81 MG tablet Take 81 mg by mouth daily.    Marland Kitchen atorvastatin (LIPITOR)  40 MG tablet Take 40 mg by mouth daily.    . carvedilol (COREG) 6.25 MG tablet Take 1 tablet (6.25 mg total) by mouth 2 (two) times daily. 180 tablet 1  . clopidogrel (PLAVIX) 75 MG tablet Take 1 tablet (75 mg total) by mouth daily with breakfast. 30 tablet 3  . Fluticasone-Salmeterol (ADVAIR) 250-50 MCG/DOSE AEPB Inhale 1 puff into the lungs 2 (two) times daily.    Marland Kitchen losartan (COZAAR) 25 MG tablet Take 1 tablet (25 mg total) by mouth daily. 30 tablet 3  . pantoprazole (PROTONIX) 40 MG tablet Take 1 tablet (40 mg total) by mouth daily. 30 tablet 6  . spironolactone (ALDACTONE) 25 MG tablet Take 1 tablet (25 mg total) by mouth daily. 30 tablet 0  . tamsulosin (FLOMAX) 0.4 MG CAPS capsule Take 0.4 mg by mouth daily.    Marland Kitchen tiotropium (SPIRIVA) 18 MCG inhalation capsule Place 18 mcg into inhaler and inhale daily.     No current facility-administered medications for this visit.     Allergies:   Lisinopril; Dobutamine; Amlodipine; and Metoprolol    Social History:  The patient  reports that he quit smoking about 27 years ago. His smoking use included cigarettes. He quit after 0.00 years of use. He has quit using smokeless tobacco.  His smokeless tobacco use included snuff. He reports that he drinks alcohol. He reports that he does not use drugs.   Family History:  The patient's family history includes Heart disease in his father; Heart failure in his father; Hyperlipidemia in his mother; Hypertension in his mother.    ROS:  Please see the history of present illness.   Otherwise, review of systems are positive for none.   All other systems are reviewed and negative.    PHYSICAL EXAM: VS:  BP 120/60 (BP Location: Left Arm, Patient Position: Sitting, Cuff Size: Normal)   Pulse 79   Ht 5\' 10"  (1.778 m)   Wt 206 lb (93.4 kg)   BMI 29.56 kg/m  , BMI Body mass index is 29.56 kg/m. GEN: Well nourished, well developed, in no acute distress  HEENT: normal  Neck: no JVD, carotid bruits, or  masses Cardiac: RRR; no  rubs, or gallops,no edema . There is 1/6 systolic murmur in the aortic area . Respiratory: Clear to auscultation, normal work of breathing GI: soft, nontender, nondistended, + BS MS: no deformity or atrophy  Skin: warm and dry, no rash Neuro:  Strength and sensation are intact Psych: euthymic mood, full affect   EKG:  EKG is ordered today. The ekg ordered today demonstrates atrial sensed ventricular paced rhythm .   Recent Labs: 10/31/2017: ALT 14; B Natriuretic Peptide 23.9 11/05/2017: BUN 10; Creatinine, Ser 0.70; Hemoglobin 12.9; Magnesium 1.9; Platelets 90; Potassium 4.0; Sodium 138    Lipid Panel    Component Value Date/Time   CHOL 117 02/28/2014 0442   TRIG 65 02/28/2014 0442   HDL 44 02/28/2014 0442   VLDL 13 02/28/2014  0442   LDLCALC 60 02/28/2014 0442      Wt Readings from Last 3 Encounters:  06/04/18 206 lb (93.4 kg)  02/27/18 201 lb (91.2 kg)  12/09/17 203 lb (92.1 kg)       ASSESSMENT AND PLAN:  1.  Chronic systolic heart failure: Due to nonischemic cardiomyopathy.  He is currently New York Heart Association class II.  Most recent ejection fraction was 45-50%.  Continue treatment with carvedilol, losartan and spironolactone.  He appears to be euvolemic without a  Diuretic.  I requested basic metabolic profile.  2.  Status post TAVR  for severe aortic stenosis: Stable.  Most recent echo showed normal functioning prosthesis.  I requested a follow-up echocardiogram to be done in February.  3. Hyperlipidemia: Currently on atorvastatin.  I requested lipid and liver profile  4.  Status post ICD CRT: Followed by the EP clinic.  EKG today shows normal functioning pacemaker.   Disposition:   FU with me in 5 months  Signed,  Lorine Bears, MD  06/04/2018 10:59 AM    [facility]

## 2018-06-05 LAB — LIPID PANEL
CHOLESTEROL TOTAL: 156 mg/dL (ref 100–199)
Chol/HDL Ratio: 2.7 ratio (ref 0.0–5.0)
HDL: 57 mg/dL (ref >39)
LDL CALC: 75 mg/dL (ref 0–99)
TRIGLYCERIDES: 120 mg/dL (ref 0–149)
VLDL CHOLESTEROL CAL: 24 mg/dL (ref 5–40)

## 2018-06-05 LAB — BASIC METABOLIC PANEL
BUN/Creatinine Ratio: 14 (ref 10–24)
BUN: 11 mg/dL (ref 8–27)
CHLORIDE: 102 mmol/L (ref 96–106)
CO2: 25 mmol/L (ref 20–29)
CREATININE: 0.81 mg/dL (ref 0.76–1.27)
Calcium: 9 mg/dL (ref 8.6–10.2)
GFR calc Af Amer: 98 mL/min/{1.73_m2} (ref >59)
GFR calc non Af Amer: 85 mL/min/{1.73_m2} (ref >59)
GLUCOSE: 101 mg/dL — AB (ref 65–99)
Potassium: 3.9 mmol/L (ref 3.5–5.2)
Sodium: 142 mmol/L (ref 134–144)

## 2018-06-05 LAB — HEPATIC FUNCTION PANEL
ALK PHOS: 50 IU/L (ref 39–117)
ALT: 10 IU/L (ref 0–44)
AST: 14 IU/L (ref 0–40)
Albumin: 4.3 g/dL (ref 3.5–4.8)
Bilirubin Total: 0.8 mg/dL (ref 0.0–1.2)
Bilirubin, Direct: 0.19 mg/dL (ref 0.00–0.40)
Total Protein: 6.1 g/dL (ref 6.0–8.5)

## 2018-06-10 ENCOUNTER — Ambulatory Visit (INDEPENDENT_AMBULATORY_CARE_PROVIDER_SITE_OTHER): Payer: Medicare Other | Admitting: *Deleted

## 2018-06-10 ENCOUNTER — Telehealth: Payer: Self-pay | Admitting: Cardiovascular Disease

## 2018-06-10 DIAGNOSIS — I5022 Chronic systolic (congestive) heart failure: Secondary | ICD-10-CM | POA: Diagnosis not present

## 2018-06-10 DIAGNOSIS — I428 Other cardiomyopathies: Secondary | ICD-10-CM

## 2018-06-10 NOTE — Progress Notes (Signed)
Remote ICD transmission.   

## 2018-06-10 NOTE — Telephone Encounter (Signed)
° ° ° °*  STAT* If patient is at the pharmacy, call can be transferred to refill team.   1. Which medications need to be refilled? (please list name of each medication and dose if known) pantoprazole (PROTONIX) 40 MG tablet  2. Which pharmacy/location (including street and city if local pharmacy) is medication to be sent to? [facility] - [place], [place] - [address]  3. Do they need a 30 day or 90 day supply? 30

## 2018-06-11 MED ORDER — PANTOPRAZOLE SODIUM 40 MG PO TBEC: 40.0000 mg | DELAYED_RELEASE_TABLET | Freq: Every day | ORAL | 6 refills | Status: DC

## 2018-06-18 ENCOUNTER — Other Ambulatory Visit: Payer: Medicare Other

## 2018-07-13 LAB — CUP PACEART REMOTE DEVICE CHECK
Battery Remaining Longevity: 62 mo
Brady Statistic AP VP Percent: 4.5 %
Brady Statistic AP VS Percent: 1 %
Brady Statistic AS VP Percent: 93 %
Brady Statistic AS VS Percent: 1.5 %
Date Time Interrogation Session: 20190918083027
HighPow Impedance: 82 Ohm
HighPow Impedance: 82 Ohm
Implantable Lead Implant Date: 20180906
Implantable Lead Implant Date: 20180906
Implantable Lead Implant Date: 20180906
Implantable Lead Location: 753859
Lead Channel Impedance Value: 1175 Ohm
Lead Channel Pacing Threshold Amplitude: 0.75 V
Lead Channel Pacing Threshold Pulse Width: 0.5 ms
Lead Channel Pacing Threshold Pulse Width: 0.5 ms
Lead Channel Sensing Intrinsic Amplitude: 12 mV
Lead Channel Sensing Intrinsic Amplitude: 3.3 mV
Lead Channel Setting Pacing Amplitude: 2 V
Lead Channel Setting Pacing Amplitude: 2.5 V
Lead Channel Setting Pacing Pulse Width: 0.5 ms
MDC IDC LEAD LOCATION: 753858
MDC IDC LEAD LOCATION: 753860
MDC IDC MSMT BATTERY REMAINING PERCENTAGE: 79 %
MDC IDC MSMT BATTERY VOLTAGE: 2.98 V
MDC IDC MSMT LEADCHNL LV PACING THRESHOLD AMPLITUDE: 2.875 V
MDC IDC MSMT LEADCHNL RA IMPEDANCE VALUE: 540 Ohm
MDC IDC MSMT LEADCHNL RA PACING THRESHOLD PULSEWIDTH: 0.5 ms
MDC IDC MSMT LEADCHNL RV IMPEDANCE VALUE: 510 Ohm
MDC IDC MSMT LEADCHNL RV PACING THRESHOLD AMPLITUDE: 0.5 V
MDC IDC PG IMPLANT DT: 20180906
MDC IDC SET LEADCHNL LV PACING AMPLITUDE: 3.875
MDC IDC SET LEADCHNL RV PACING PULSEWIDTH: 0.5 ms
MDC IDC SET LEADCHNL RV SENSING SENSITIVITY: 0.5 mV
MDC IDC STAT BRADY RA PERCENT PACED: 3.5 %
Pulse Gen Serial Number: 7421078

## 2018-09-09 ENCOUNTER — Ambulatory Visit (INDEPENDENT_AMBULATORY_CARE_PROVIDER_SITE_OTHER): Payer: Medicare Other

## 2018-09-09 DIAGNOSIS — I428 Other cardiomyopathies: Secondary | ICD-10-CM

## 2018-09-09 DIAGNOSIS — I5022 Chronic systolic (congestive) heart failure: Secondary | ICD-10-CM

## 2018-09-09 NOTE — Progress Notes (Signed)
Remote ICD transmission.   

## 2018-09-10 ENCOUNTER — Encounter: Payer: Self-pay | Admitting: Cardiology

## 2018-09-10 ENCOUNTER — Other Ambulatory Visit: Payer: Self-pay | Admitting: *Deleted

## 2018-09-10 MED ORDER — CARVEDILOL 6.25 MG PO TABS: 6.2500 mg | ORAL_TABLET | Freq: Two times a day (BID) | ORAL | 1 refills | Status: AC

## 2018-09-24 ENCOUNTER — Ambulatory Visit: Payer: Medicare Other | Admitting: Nurse Practitioner

## 2018-09-24 ENCOUNTER — Encounter: Payer: Self-pay | Admitting: Nurse Practitioner

## 2018-09-24 VITALS — BP 128/82 | HR 103 | Ht 70.0 in | Wt 210.0 lb

## 2018-09-24 DIAGNOSIS — I428 Other cardiomyopathies: Secondary | ICD-10-CM

## 2018-09-24 DIAGNOSIS — I4901 Ventricular fibrillation: Secondary | ICD-10-CM | POA: Diagnosis not present

## 2018-09-24 DIAGNOSIS — Z952 Presence of prosthetic heart valve: Secondary | ICD-10-CM | POA: Diagnosis not present

## 2018-09-24 DIAGNOSIS — I5022 Chronic systolic (congestive) heart failure: Secondary | ICD-10-CM | POA: Diagnosis not present

## 2018-09-24 LAB — CUP PACEART INCLINIC DEVICE CHECK
Implantable Lead Implant Date: 20180906
Implantable Lead Location: 753858
MDC IDC LEAD IMPLANT DT: 20180906
MDC IDC LEAD IMPLANT DT: 20180906
MDC IDC LEAD LOCATION: 753859
MDC IDC LEAD LOCATION: 753860
MDC IDC PG IMPLANT DT: 20180906
MDC IDC PG SERIAL: 7421078
MDC IDC SESS DTM: 20200102095342

## 2018-09-24 NOTE — Progress Notes (Signed)
Electrophysiology Office Note Date: 09/24/2018  ID:  Benjamin Frost, DOB September 24, 1940, MRN [id number]  PCP: Rory Percy, MD Primary Cardiologist: Johney Frame Structural: Excell Seltzer Electrophysiologist: Allred  CC: Routine ICD follow-up  Benjamin Frost is a 78 y.o. male seen today for Dr Johney Frame.  He presents today for routine electrophysiology followup.  Since last being seen in our clinic, the patient reports doing very well. He has noted intermittent positional diaphragmatic stimulation.  He denies chest pain, palpitations, dyspnea (above baseline), PND, orthopnea, nausea, vomiting, dizziness, syncope, edema, weight gain, or early satiety.  He has not had ICD shocks.   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 AICD (automatic cardioverter/defibrillator) present    a. St Jude, placed after VF arrest during a dobuatamine echo.   . Aortic stenosis    a. mod-severe low flow low gradient AS.   Marland Kitchen Aortic stenosis, severe    a. 11/04/17: s/p TAVR by [name] and [name].   . Chronic systolic heart failure (HCC)   . COPD (chronic obstructive pulmonary disease) (HCC)   . GERD (gastroesophageal reflux disease)   . History of BPH   . History of kidney stones   . Hyperlipidemia   . Hypertension   . Left bundle branch block   . S/P TAVR (transcatheter aortic valve replacement)    a. 10/2017: s/p TAVR with an Edwards Sapien 3 THV (size 26 mm, model # 9600TFX, serial # [id number])  . Sinus tachycardia    Past Surgical History:  Procedure Laterality Date  . 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 CARDIAC CATHETERIZATION  2012   [facility]  . CARDIAC CATHETERIZATION     [facility]  . CATARACT EXTRACTION, BILATERAL    . LEFT AND RIGHT HEART CATHETERIZATION WITH CORONARY ANGIOGRAM N/A 03/23/2014   Procedure:  LEFT AND RIGHT HEART CATHETERIZATION WITH CORONARY ANGIOGRAM;  Surgeon: [name], MD;  Location: [facility] CATH [facility];  Service: Cardiovascular;  Laterality: N/A;  . LEG SURGERY Left    surgery d/t injury from nail gun  . RIGHT/LEFT HEART CATH AND CORONARY ANGIOGRAPHY N/A 05/16/2017   Procedure: RIGHT/LEFT HEART CATH AND CORONARY ANGIOGRAPHY;  Surgeon: [name], MD;  Location: [facility] INVASIVE CV LAB;  Service: Cardiovascular;  Laterality: N/A;  . TEE WITHOUT CARDIOVERSION N/A 11/04/2017   Procedure: TRANSESOPHAGEAL ECHOCARDIOGRAM (TEE);  Surgeon: [name], MD;  Location: [facility] OR;  Service: Open Heart Surgery;  Laterality: N/A;  . TRANSCATHETER AORTIC VALVE REPLACEMENT, TRANSFEMORAL N/A 11/04/2017   Procedure: TRANSCATHETER AORTIC VALVE REPLACEMENT, TRANSFEMORAL;  Surgeon: [name], MD;  Location: [facility] OR;  Service: Open Heart Surgery;  Laterality: N/A;    Current Outpatient Medications  Medication Sig Dispense Refill  . albuterol (PROAIR HFA) 108 (90 BASE) MCG/ACT inhaler Inhale 2 puffs into the lungs every 6 (six) hours as needed for wheezing or shortness of breath.    Marland Kitchen amoxicillin (AMOXIL) 500 MG tablet Take 2,000 mg (four 500 mg tablets) one hour prior to dental appointments. 4 tablet 6  . aspirin 81 MG tablet Take 81 mg by mouth daily.    Marland Kitchen atorvastatin (LIPITOR) 40 MG tablet Take 40 mg by mouth daily.    . carvedilol (COREG) 6.25 MG tablet Take 1 tablet (6.25 mg total) by mouth 2 (two) times daily. 180  tablet 1  . clopidogrel (PLAVIX) 75 MG tablet Take 1 tablet (75 mg total) by mouth daily with breakfast. 30 tablet 3  . Fluticasone-Salmeterol (ADVAIR) 250-50 MCG/DOSE AEPB Inhale 1 puff into the lungs 2 (two) times daily.    Marland Kitchen losartan (COZAAR) 25 MG tablet Take 1 tablet (25 mg total) by mouth daily. 30 tablet 3  . pantoprazole (PROTONIX) 40 MG tablet Take 1 tablet (40 mg total) by mouth daily. 30 tablet 6  . spironolactone (ALDACTONE) 25 MG tablet Take 1 tablet (25 mg total) by  mouth daily. 30 tablet 0  . tamsulosin (FLOMAX) 0.4 MG CAPS capsule Take 0.4 mg by mouth daily.    Marland Kitchen tiotropium (SPIRIVA) 18 MCG inhalation capsule Place 18 mcg into inhaler and inhale daily.     No current facility-administered medications for this visit.     Allergies:   Lisinopril; Dobutamine; Amlodipine; and Metoprolol   Social History: Social History   Socioeconomic History  . Marital status: Married    Spouse name: Not on file  . Number of children: Not on file  . Years of education: Not on file  . Highest education level: Not on file  Occupational History  . Not on file  Social Needs  . Financial resource strain: Not on file  . Food insecurity:    Worry: Not on file    Inability: Not on file  . Transportation needs:    Medical: Not on file    Non-medical: Not on file  Tobacco Use  . Smoking status: Former Smoker    Years: 0.00    Types: Cigarettes    Last attempt to quit: 1992    Years since quitting: 28.0  . Smokeless tobacco: Former Neurosurgeon    Types: Snuff  . Tobacco comment: quit 25 years ago  Substance and Sexual Activity  . Alcohol use: Yes    Comment: occasional Beer   . Drug use: No  . Sexual activity: Not on file  Lifestyle  . Physical activity:    Days per week: Not on file    Minutes per session: Not on file  . Stress: Not on file  Relationships  . Social connections:    Talks on phone: Not on file    Gets together: Not on file    Attends religious service: Not on file    Active member of club or organization: Not on file    Attends meetings of clubs or organizations: Not on file    Relationship status: Not on file  . Intimate partner violence:    Fear of current or ex partner: Not on file    Emotionally abused: Not on file    Physically abused: Not on file    Forced sexual activity: Not on file  Other Topics Concern  . Not on file  Social History Narrative  . Not on file    Family History: Family History  Problem Relation Age of Onset   . Heart disease Father   . Heart failure Father   . Hypertension Mother   . Hyperlipidemia Mother     Review of Systems: All other systems reviewed and are otherwise negative except as noted above.   Physical Exam: VS:  BP 128/82   Pulse (!) 103   Ht 5\' 10"  (1.778 m)   Wt 210 lb (95.3 kg)   SpO2 92%   BMI 30.13 kg/m  , BMI Body mass index is 30.13 kg/m.  GEN- The patient is well appearing, alert  and oriented x 3 today.   HEENT: normocephalic, atraumatic; sclera clear, conjunctiva pink; hearing intact; oropharynx clear; neck supple  Lungs- Clear to ausculation bilaterally, normal work of breathing.  No wheezes, rales, rhonchi Heart- Regular rate and rhythm (paced) GI- soft, non-tender, non-distended, bowel sounds present, no hepatosplenomegaly Extremities- no clubbing, cyanosis, or edema  MS- no significant deformity or atrophy Skin- warm and dry, no rash or lesion; ICD pocket well healed Psych- euthymic mood, full affect Neuro- strength and sensation are intact  ICD interrogation- reviewed in detail today,  See PACEART report  EKG:  EKG is not ordered today.  Recent Labs: 10/31/2017: B Natriuretic Peptide 23.9 11/05/2017: Hemoglobin 12.9; Magnesium 1.9; Platelets 90 06/04/2018: ALT 10; BUN 11; Creatinine, Ser 0.81; Potassium 3.9; Sodium 142   Wt Readings from Last 3 Encounters:  09/24/18 210 lb (95.3 kg)  06/04/18 206 lb (93.4 kg)  02/27/18 201 lb (91.2 kg)     Other studies Reviewed: Additional studies/ records that were reviewed today include: Dr Kirke Corin and Dr Jenel Lucks office notes   Assessment and Plan:  1.  Chronic systolic dysfunction euvolemic today Stable on an appropriate medical regimen EF improved to 45-50% post CRT Normal ICD function See Pace Art report No changes today LV threshold increased resulting in intermittent diaphragmatic stim. Pulse width extended to today and output decreased. I did not change vector as he has had a good response to  CRT. If diaphragmatic stim persists, can consider checking other vectors.   2.  Prior VF arrest No recent ventricular arrhythmias  3.  S/p TAVR Repeat echo due 10/2018   Current medicines are reviewed at length with the patient today.   The patient does not have concerns regarding his medicines.  The following changes were made today:  none  Labs/ tests ordered today include: none Orders Placed This Encounter  Procedures  . CUP PACEART INCLINIC DEVICE CHECK     Disposition:   Follow up with Arsenio Katz, Dr Allred 1 year, Dr Kirke Corin as scheduled     Signed, Gypsy Balsam, NP 09/24/2018 12:00 PM  [facility] [address] [place] [address] [phone] (office) [phone] (fax)

## 2018-09-24 NOTE — Patient Instructions (Signed)
Medication Instructions:  none If you need a refill on your cardiac medications before your next appointment, please call your pharmacy.   Lab work: none If you have labs (blood work) drawn today and your tests are completely normal, you will receive your results only by: Marland Kitchen MyChart Message (if you have MyChart) OR . A paper copy in the mail If you have any lab test that is abnormal or we need to change your treatment, we will call you to review the results.  Testing/Procedures: none  Follow-Up: At [facility], you and your health needs are our priority.  As part of our continuing mission to provide you with exceptional heart care, we have created designated Provider Care Teams.  These Care Teams include your primary Cardiologist (physician) and Advanced Practice Providers (APPs -  Physician Assistants and Nurse Practitioners) who all work together to provide you with the care you need, when you need it. You will need a follow up appointment in 1 years.  Please call our office 2 months in advance to schedule this appointment.  You may see Dr Johney Frame or one of the following Advanced Practice Providers on your designated Care Team:   Gypsy Balsam, NP . Francis Dowse, PA-C  Any Other Special Instructions Will Be Listed Below (If Applicable). Remote monitoring is used to monitor your of ICD from home. This monitoring reduces the number of office visits required to check your device to one time per year. It allows [place] to keep an eye on the functioning of your device to ensure it is working properly. You are scheduled for a device check from home on 12/09/18. You may send your transmission at any time that day. If you have a wireless device, the transmission will be sent automatically. After your physician reviews your transmission, you will receive a postcard with your next transmission date.

## 2018-10-10 LAB — CUP PACEART REMOTE DEVICE CHECK
Battery Remaining Longevity: 61 mo
Battery Remaining Percentage: 77 %
Battery Voltage: 2.96 V
Brady Statistic RA Percent Paced: 2.7 %
HIGH POWER IMPEDANCE MEASURED VALUE: 84 Ohm
HighPow Impedance: 84 Ohm
Implantable Lead Implant Date: 20180906
Implantable Lead Implant Date: 20180906
Implantable Lead Location: 753859
Implantable Lead Location: 753860
Implantable Pulse Generator Implant Date: 20180906
Lead Channel Impedance Value: 1300 Ohm
Lead Channel Impedance Value: 550 Ohm
Lead Channel Pacing Threshold Amplitude: 0.5 V
Lead Channel Pacing Threshold Amplitude: 0.75 V
Lead Channel Pacing Threshold Amplitude: 3 V
Lead Channel Pacing Threshold Pulse Width: 0.5 ms
Lead Channel Pacing Threshold Pulse Width: 0.5 ms
Lead Channel Sensing Intrinsic Amplitude: 11.7 mV
Lead Channel Setting Pacing Amplitude: 4 V
Lead Channel Setting Pacing Pulse Width: 0.5 ms
Lead Channel Setting Pacing Pulse Width: 0.5 ms
Lead Channel Setting Sensing Sensitivity: 0.5 mV
MDC IDC LEAD IMPLANT DT: 20180906
MDC IDC LEAD LOCATION: 753858
MDC IDC MSMT LEADCHNL RA SENSING INTR AMPL: 3.7 mV
MDC IDC MSMT LEADCHNL RV IMPEDANCE VALUE: 550 Ohm
MDC IDC MSMT LEADCHNL RV PACING THRESHOLD PULSEWIDTH: 0.5 ms
MDC IDC SESS DTM: 20191218070017
MDC IDC SET LEADCHNL RA PACING AMPLITUDE: 2 V
MDC IDC SET LEADCHNL RV PACING AMPLITUDE: 2.5 V
MDC IDC STAT BRADY AP VP PERCENT: 3.5 %
MDC IDC STAT BRADY AP VS PERCENT: 1 %
MDC IDC STAT BRADY AS VP PERCENT: 94 %
MDC IDC STAT BRADY AS VS PERCENT: 1.4 %
Pulse Gen Serial Number: 7421078

## 2018-10-16 ENCOUNTER — Telehealth: Payer: Self-pay

## 2018-10-16 NOTE — Telephone Encounter (Signed)
-----   Message from Hillis Range, MD sent at 10/15/2018  8:56 PM EST ----- Remote device check reviewed.   Device report notable for:  LV threshold has elevated over the past year.  He is overdue to see me.  Please schedule an office visit.

## 2018-10-16 NOTE — Telephone Encounter (Signed)
Spoke with pt informed him of Dr. Amedeo Plenty recommendation to see him the clinic pt agreeable to apt on 11/04/2018 at 4:15pm

## 2018-11-03 ENCOUNTER — Ambulatory Visit (INDEPENDENT_AMBULATORY_CARE_PROVIDER_SITE_OTHER): Payer: Medicare Other

## 2018-11-03 DIAGNOSIS — I359 Nonrheumatic aortic valve disorder, unspecified: Secondary | ICD-10-CM

## 2018-11-04 ENCOUNTER — Encounter: Payer: Self-pay | Admitting: Internal Medicine

## 2018-11-04 ENCOUNTER — Ambulatory Visit: Payer: Medicare Other | Admitting: Internal Medicine

## 2018-11-04 VITALS — BP 118/80 | HR 77 | Ht 70.0 in | Wt 200.2 lb

## 2018-11-04 DIAGNOSIS — I4901 Ventricular fibrillation: Secondary | ICD-10-CM

## 2018-11-04 DIAGNOSIS — I5022 Chronic systolic (congestive) heart failure: Secondary | ICD-10-CM | POA: Diagnosis not present

## 2018-11-04 DIAGNOSIS — Z952 Presence of prosthetic heart valve: Secondary | ICD-10-CM

## 2018-11-04 DIAGNOSIS — Z95810 Presence of automatic (implantable) cardiac defibrillator: Secondary | ICD-10-CM

## 2018-11-04 DIAGNOSIS — I447 Left bundle-branch block, unspecified: Secondary | ICD-10-CM | POA: Diagnosis not present

## 2018-11-04 DIAGNOSIS — T82110A Breakdown (mechanical) of cardiac electrode, initial encounter: Secondary | ICD-10-CM

## 2018-11-04 NOTE — Progress Notes (Signed)
PCP: Leanna Sato, MD Primary Cardiologist: Dr Kirke Corin Primary EP: Dr Mariane Baumgarten Crilley is a 78 y.o. male who presents today for routine electrophysiology followup.  Since last being seen in our clinic, the patient reports doing very well.  He has noticed rare positional diaphragmatic stim from his device.  Today, he denies symptoms of palpitations, chest pain, shortness of breath,  lower extremity edema, dizziness, presyncope, syncope, or ICD shocks.  The patient is otherwise without complaint today.   Past Medical History:  Diagnosis Date  . AICD (automatic cardioverter/defibrillator) present    a. St Jude, placed after VF arrest during a dobuatamine echo.   . Aortic stenosis    a. mod-severe low flow low gradient AS.   Marland Kitchen Aortic stenosis, severe    a. 11/04/17: s/p TAVR by [name] and [name].   . Chronic systolic heart failure (HCC)   . COPD (chronic obstructive pulmonary disease) (HCC)   . GERD (gastroesophageal reflux disease)   . History of BPH   . History of kidney stones   . Hyperlipidemia   . Hypertension   . Left bundle branch block   . S/P TAVR (transcatheter aortic valve replacement)    a. 10/2017: s/p TAVR with an Edwards Sapien 3 THV (size 26 mm, model # 9600TFX, serial # [id number])  . Sinus tachycardia    Past Surgical History:  Procedure Laterality Date  . 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 CARDIAC CATHETERIZATION  2012   [facility]  . CARDIAC CATHETERIZATION     [facility]  . CATARACT EXTRACTION, BILATERAL    . LEFT AND RIGHT HEART CATHETERIZATION WITH CORONARY ANGIOGRAM N/A 03/23/2014   Procedure: LEFT AND RIGHT HEART CATHETERIZATION WITH CORONARY ANGIOGRAM;  Surgeon: [name], MD;  Location: [facility] CATH LAB;  Service: Cardiovascular;  Laterality: N/A;  . LEG SURGERY Left    surgery d/t injury from nail gun  . RIGHT/LEFT HEART CATH AND CORONARY ANGIOGRAPHY N/A 05/16/2017   Procedure: RIGHT/LEFT HEART CATH AND CORONARY ANGIOGRAPHY;  Surgeon: [name], MD;  Location: [facility] INVASIVE CV LAB;  Service: Cardiovascular;  Laterality: N/A;  . TEE WITHOUT CARDIOVERSION N/A 11/04/2017   Procedure: TRANSESOPHAGEAL ECHOCARDIOGRAM (TEE);  Surgeon: [name], MD;  Location: [facility] OR;  Service: Open Heart Surgery;  Laterality: N/A;  . TRANSCATHETER AORTIC VALVE REPLACEMENT, TRANSFEMORAL N/A 11/04/2017   Procedure: TRANSCATHETER AORTIC VALVE REPLACEMENT, TRANSFEMORAL;  Surgeon: [name], MD;  Location: [facility] OR;  Service: Open Heart Surgery;  Laterality: N/A;    ROS- all systems are reviewed and negative except as per HPI above  Current Outpatient Medications  Medication Sig Dispense Refill  . albuterol (PROAIR HFA) 108 (90 BASE) MCG/ACT inhaler Inhale 2 puffs into the lungs every 6 (six) hours as needed for wheezing or shortness of breath.    Marland Kitchen amoxicillin (AMOXIL) 500 MG tablet Take 2,000 mg (four 500 mg tablets) one hour prior to dental appointments. 4 tablet 6  . aspirin 81 MG tablet Take 81 mg by mouth daily.    Marland Kitchen atorvastatin (LIPITOR) 40 MG tablet Take 40 mg by mouth daily.    . carvedilol (COREG) 6.25 MG tablet Take 1 tablet (6.25 mg total) by mouth 2 (two) times daily. 180 tablet 1  . clopidogrel (PLAVIX) 75 MG tablet Take 1 tablet (75 mg total) by mouth daily with breakfast. 30 tablet 3  . Fluticasone-Salmeterol (ADVAIR) 250-50 MCG/DOSE  AEPB Inhale 1 puff into the lungs 2 (two) times daily.    Marland Kitchen losartan (COZAAR) 25 MG tablet Take 1 tablet (25 mg total) by mouth daily. 30 tablet 3  . pantoprazole (PROTONIX) 40 MG tablet Take 1 tablet (40 mg total) by mouth daily. 30 tablet 6  . spironolactone (ALDACTONE) 25 MG tablet Take 1 tablet (25 mg total) by mouth daily. 30 tablet 0  . tamsulosin (FLOMAX) 0.4 MG CAPS capsule Take 0.4 mg by mouth daily.    Marland Kitchen tiotropium (SPIRIVA) 18 MCG inhalation capsule Place 18 mcg into inhaler and inhale daily.     No current  facility-administered medications for this visit.     Physical Exam: Vitals:   11/04/18 1633  BP: 118/80  Pulse: 77  SpO2: 99%  Weight: 200 lb 3.2 oz (90.8 kg)  Height: 5\' 10"  (1.778 m)    GEN- The patient is well appearing, alert and oriented x 3 today.   Head- normocephalic, atraumatic Eyes-  Sclera clear, conjunctiva pink Ears- hearing intact Oropharynx- clear Lungs- Clear to ausculation bilaterally, normal work of breathing Chest- ICD pocket is well healed Heart- Regular rate and rhythm, no murmurs, rubs or gallops, PMI not laterally displaced GI- soft, NT, ND, + BS Extremities- no clubbing, cyanosis, or edema  ICD interrogation- reviewed in detail today,  See PACEART report  ekg tracing ordered today is personally reviewed and shows  Sinus with BiV pacing  Wt Readings from Last 3 Encounters:  11/04/18 200 lb 3.2 oz (90.8 kg)  09/24/18 210 lb (95.3 kg)  06/04/18 206 lb (93.4 kg)    Assessment and Plan:  1.  Chronic systolic dysfunction/ LBBB euvolemic today Stable on an appropriate medical regimen Normal ICD function, though he has very few LV pacing options.  Only options are distal 1 to prox 4 (2.125V @ threshold) and distal 1 to mid 2 (2.125V @ threshold) with diaphragmatic stim on both 3V @ ) See Pace Art report I have changed LV lead output autocapture to 0.25V safety margin rather than 0.5V today.  No other changes EF has improved from 25%-->45% with CRT Enroll in ICM device clinic  Obtain PA/ lateral CXR to evaluate LV lead placement. We discussed turning LV lead off, keeping as is, or lead revision.  He is very clear that he would prefer a conservative approach.  He feels that his diaphragmatic stim is rare and bearable.  2. Prior VF arrest No recent arrhythmias  Merlin Return to see me in 2 months  Hillis Range MD, [facility] 11/04/2018 4:57 PM

## 2018-11-04 NOTE — Patient Instructions (Addendum)
Medication Instructions:  Your physician recommends that you continue on your current medications as directed. Please refer to the Current Medication list given to you today.  Labwork: None ordered.  Testing/Procedures: A chest x-ray takes a picture of the organs and structures inside the chest, including the heart, lungs, and blood vessels. This test can show several things, including, whether the heart is enlarges; whether fluid is building up in the lungs; and whether pacemaker / defibrillator leads are still in place.  Order placed for xray at [facility] hospital  Follow-Up: Your physician wants you to follow-up in: 2 months with [name].     Remote monitoring is used to monitor your ICD from home. This monitoring reduces the number of office visits required to check your device to one time per year. It allows [place] to keep an eye on the functioning of your device to ensure it is working properly. You are scheduled for a device check from home on 12/09/2018. You may send your transmission at any time that day. If you have a wireless device, the transmission will be sent automatically. After your physician reviews your transmission, you will receive a postcard with your next transmission date.  Any Other Special Instructions Will Be Listed Below (If Applicable).  If you need a refill on your cardiac medications before your next appointment, please call your pharmacy.

## 2018-11-05 ENCOUNTER — Ambulatory Visit
Admission: RE | Admit: 2018-11-05 | Discharge: 2018-11-05 | Disposition: A | Discharge status: Home / Self Care | Payer: Medicare Other | Source: Ambulatory Visit | Attending: Internal Medicine | Admitting: Internal Medicine

## 2018-11-05 ENCOUNTER — Ambulatory Visit
Admission: RE | Admit: 2018-11-05 | Discharge: 2018-11-05 | Disposition: A | Discharge status: Home / Self Care | Payer: Medicare Other | Attending: Internal Medicine | Admitting: Internal Medicine

## 2018-11-05 DIAGNOSIS — T82110A Breakdown (mechanical) of cardiac electrode, initial encounter: Secondary | ICD-10-CM | POA: Insufficient documentation

## 2018-11-06 ENCOUNTER — Telehealth: Payer: Self-pay | Admitting: *Deleted

## 2018-11-06 NOTE — Telephone Encounter (Signed)
Patient made aware of results and verbalized understanding.  

## 2018-11-06 NOTE — Telephone Encounter (Signed)
-----   Message from [name], MD sent at 11/04/2018 11:20 AM EST ----- Inform patient that echo was fine. Stable EF and TAVR.

## 2018-11-06 NOTE — Telephone Encounter (Signed)
There was no answer and voicemail was not set up.

## 2018-11-17 ENCOUNTER — Telehealth: Payer: Self-pay | Admitting: Physician Assistant

## 2018-11-17 NOTE — Telephone Encounter (Signed)
  [facility]   MULTIDISCIPLINARY HEART VALVE TEAM   Patient was due for his 1 year TAVR follow up. His echo was completed on 11/03/18 (report copied below). He was seen by [name] within the appropriate window. I completed a KCCQ with him over the phone. He is doing well with NYHA class II symptoms, mostly related to his COPD.    Echo 11/03/18 IMPRESSIONS  1. The left ventricle has mild-moderately reduced systolic function of 40-45%. The cavity size was mildly increased. There is mildly increased left ventricular wall thickness. Left ventricular diastolic Doppler parameters are consistent with impaired  relaxation.  2. The right ventricle has normal systolic function. The cavity was mildly enlarged. There is not assessed. Right ventricular systolic pressure could not be assessed.  3. Left atrial size was mildly dilated.  4. Right atrial size was mildly dilated.  5. The mitral valve is degenerative. There is mild thickening. There is moderate mitral annular calcification present.  6. The tricuspid valve is not assessed.  7. A 37mm Edwards Sapien bioprosthetic aortic valve valve is present in the aortic position. Procedure Date: 11/04/17. Mean gradient is 15 mmHg, decreased from 21 mmHg on prior study.  8. The aortic root and ascending aorta are normal in size and structure.  9. The interatrial septum was not well visualized.   Aortic Valve: The aortic valve was not well visualized. The aortic valve has been repaired/replaced Aortic valve regurgitation was not visualized by color flow Doppler. A 57mm Edwards Sapien bioprosthetic, stented aortic valve valve is present in the  aortic position. Procedure Date: 11/04/17.  AORTIC VALVE AV Vmean:     170.333 cm/s AV VTI:       0.486 m AV Mean Grad: 15.0 mmHg    Cline Crock PA-C  [facility]

## 2018-11-27 ENCOUNTER — Encounter: Payer: Self-pay | Admitting: Thoracic Surgery (Cardiothoracic Vascular Surgery)

## 2018-12-09 ENCOUNTER — Other Ambulatory Visit: Payer: Self-pay

## 2018-12-09 ENCOUNTER — Ambulatory Visit (INDEPENDENT_AMBULATORY_CARE_PROVIDER_SITE_OTHER): Payer: Medicare Other | Admitting: *Deleted

## 2018-12-09 DIAGNOSIS — I428 Other cardiomyopathies: Secondary | ICD-10-CM

## 2018-12-09 DIAGNOSIS — I5022 Chronic systolic (congestive) heart failure: Secondary | ICD-10-CM | POA: Diagnosis not present

## 2018-12-09 LAB — CUP PACEART REMOTE DEVICE CHECK
Battery Remaining Longevity: 61 mo
Battery Remaining Percentage: 73 %
Battery Voltage: 2.96 V
Brady Statistic AP VS Percent: 1 %
Brady Statistic RA Percent Paced: 1 %
Date Time Interrogation Session: 20200318060016
HIGH POWER IMPEDANCE MEASURED VALUE: 88 Ohm
HIGH POWER IMPEDANCE MEASURED VALUE: 88 Ohm
Implantable Lead Implant Date: 20180906
Implantable Lead Implant Date: 20180906
Implantable Lead Location: 753860
Implantable Pulse Generator Implant Date: 20180906
Lead Channel Impedance Value: 1250 Ohm
Lead Channel Impedance Value: 530 Ohm
Lead Channel Pacing Threshold Amplitude: 0.5 V
Lead Channel Pacing Threshold Amplitude: 0.5 V
Lead Channel Pacing Threshold Amplitude: 2.375 V
Lead Channel Pacing Threshold Pulse Width: 0.5 ms
Lead Channel Sensing Intrinsic Amplitude: 11.7 mV
Lead Channel Sensing Intrinsic Amplitude: 3.6 mV
Lead Channel Setting Pacing Amplitude: 2.625
Lead Channel Setting Pacing Pulse Width: 1 ms
MDC IDC LEAD IMPLANT DT: 20180906
MDC IDC LEAD LOCATION: 753858
MDC IDC LEAD LOCATION: 753859
MDC IDC MSMT LEADCHNL LV PACING THRESHOLD PULSEWIDTH: 1 ms
MDC IDC MSMT LEADCHNL RA IMPEDANCE VALUE: 530 Ohm
MDC IDC MSMT LEADCHNL RV PACING THRESHOLD PULSEWIDTH: 0.5 ms
MDC IDC PG SERIAL: 7421078
MDC IDC SET LEADCHNL RA PACING AMPLITUDE: 2 V
MDC IDC SET LEADCHNL RV PACING AMPLITUDE: 2.5 V
MDC IDC SET LEADCHNL RV PACING PULSEWIDTH: 0.5 ms
MDC IDC SET LEADCHNL RV SENSING SENSITIVITY: 0.5 mV
MDC IDC STAT BRADY AP VP PERCENT: 1 %
MDC IDC STAT BRADY AS VP PERCENT: 98 %
MDC IDC STAT BRADY AS VS PERCENT: 1.3 %

## 2018-12-11 ENCOUNTER — Telehealth: Payer: Self-pay

## 2018-12-11 ENCOUNTER — Ambulatory Visit (INDEPENDENT_AMBULATORY_CARE_PROVIDER_SITE_OTHER): Payer: Medicare Other

## 2018-12-11 ENCOUNTER — Other Ambulatory Visit: Payer: Self-pay

## 2018-12-11 DIAGNOSIS — I5022 Chronic systolic (congestive) heart failure: Secondary | ICD-10-CM | POA: Diagnosis not present

## 2018-12-11 DIAGNOSIS — Z95810 Presence of automatic (implantable) cardiac defibrillator: Secondary | ICD-10-CM | POA: Diagnosis not present

## 2018-12-11 NOTE — Telephone Encounter (Signed)
Referred to ICM clinic by Dr Johney Frame.    Spoke with patient and agreeable to monthly follow up once it is determined the copay cost.  See ICM note 12/11/2018 for transmission review. Provided ICM number and explained should call if experiencing any fluid symptoms such as weight gain, shortness of breath or extremity/abdominal swelling.

## 2018-12-11 NOTE — Progress Notes (Signed)
EPIC Encounter for ICM Monitoring  Patient Name: Benjamin Frost is a 78 y.o. male Date: 12/11/2018 Primary Care Physican: Leanna Sato, MD Primary Cardiologist: Kirke Corin Electrophysiologist: Allred Bi-V Pacing:  98 %  Today's Weight:  Unknown (typically weighs every few days)       1st ICM remote. Patient agreed to monthly ICM as long as he can afford the copay.  Will check how much insurance charges for todays service and let patient know in May.  Heart Failure questions reviewed.  Pt asymptomatic but gained a few pounds during most recent decreased impedance. He has COPD and is typically [name] of breath but it is not any worse than usual.    Report: Thoracic impedance normal but did have episodes of decrease impedance in last month.   Prescribed: Spironolactone 25 mg take 1 tablet daily.  Recommendations:  No changes.  Provided direct ICM number and encouraged to call for fluid symptoms.  Follow-up plan: ICM clinic phone appointment on 02/08/2019.   Office appt 01/04/2019 with [name].    Copy of ICM check sent to [name].   3 month ICM trend: 12/09/2018    1 Year ICM trend:       Karie Soda, RN 12/11/2018 3:01 PM

## 2018-12-17 ENCOUNTER — Encounter: Payer: Self-pay | Admitting: Cardiology

## 2018-12-17 NOTE — Progress Notes (Signed)
Remote ICD transmission.   

## 2018-12-28 ENCOUNTER — Telehealth: Payer: Self-pay

## 2018-12-28 NOTE — Telephone Encounter (Signed)
Spoke with pt regarding video visit on 12/29/18. Pt stated he does not have access to MyChart and was advise to keep televisit. Pt was also advise to check BP and pulse day of appt. Pt questions and concerns were address.

## 2018-12-29 ENCOUNTER — Other Ambulatory Visit: Payer: Self-pay

## 2018-12-29 ENCOUNTER — Telehealth (INDEPENDENT_AMBULATORY_CARE_PROVIDER_SITE_OTHER): Payer: Medicare Other | Admitting: Internal Medicine

## 2018-12-29 ENCOUNTER — Encounter: Payer: Self-pay | Admitting: Internal Medicine

## 2018-12-29 DIAGNOSIS — I428 Other cardiomyopathies: Secondary | ICD-10-CM | POA: Diagnosis not present

## 2018-12-29 DIAGNOSIS — I5022 Chronic systolic (congestive) heart failure: Secondary | ICD-10-CM

## 2018-12-29 DIAGNOSIS — I447 Left bundle-branch block, unspecified: Secondary | ICD-10-CM | POA: Diagnosis not present

## 2018-12-29 NOTE — Progress Notes (Signed)
Electrophysiology TeleHealth Note   Due to national recommendations of social distancing due to COVID 19, an audio telehealth visit is felt to be most appropriate for this patient at this time.  Verbal consent was obtained from the patient today.   Date:  12/29/2018   ID:  Benjamin Frost, DOB [birth year], MRN [id number]  Location: patient's home  Provider location: [address], [place]  Evaluation Performed: Follow-up visit  PCP:  Leanna Sato, MD  Cardiologist:  Dr Kirke Corin Electrophysiologist:  Dr Johney Frame  Chief Complaint:  CHF  History of Present Illness:    Benjamin Frost is a 78 y.o. male who presents via audio/video conferencing for a telehealth visit today.  Since last being seen in our clinic, the patient reports doing very well.  His diaphragmatic stim has improved.  He has not had any recent decline. Today, he denies symptoms of palpitations, chest pain, shortness of breath,  lower extremity edema, dizziness, presyncope, or syncope.  The patient is otherwise without complaint today.  The patient denies symptoms of fevers, chills, cough, or new SOB worrisome for COVID 19.  Past Medical History:  Diagnosis Date  . AICD (automatic cardioverter/defibrillator) present    a. St Jude, placed after VF arrest during a dobuatamine echo.   . Aortic stenosis    a. mod-severe low flow low gradient AS.   Marland Kitchen Aortic stenosis, severe    a. 11/04/17: s/p TAVR by [name] and [name].   . Chronic systolic heart failure (HCC)   . COPD (chronic obstructive pulmonary disease) (HCC)   . GERD (gastroesophageal reflux disease)   . History of BPH   . History of kidney stones   . Hyperlipidemia   . Hypertension   . Left bundle branch block   . S/P TAVR (transcatheter aortic valve replacement)    a. 10/2017: s/p TAVR with an Edwards Sapien 3 THV (size 26 mm, model # 9600TFX, serial # [id number])  . Sinus tachycardia     Past Surgical History:  Procedure Laterality Date  .  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 CARDIAC CATHETERIZATION  2012   [facility]  . CARDIAC CATHETERIZATION     [facility]  . CATARACT EXTRACTION, BILATERAL    . LEFT AND RIGHT HEART CATHETERIZATION WITH CORONARY ANGIOGRAM N/A 03/23/2014   Procedure: LEFT AND RIGHT HEART CATHETERIZATION WITH CORONARY ANGIOGRAM;  Surgeon: [name], MD;  Location: [facility] CATH LAB;  Service: Cardiovascular;  Laterality: N/A;  . LEG SURGERY Left    surgery d/t injury from nail gun  . RIGHT/LEFT HEART CATH AND CORONARY ANGIOGRAPHY N/A 05/16/2017   Procedure: RIGHT/LEFT HEART CATH AND CORONARY ANGIOGRAPHY;  Surgeon: [name], MD;  Location: [facility] INVASIVE CV LAB;  Service: Cardiovascular;  Laterality: N/A;  . TEE WITHOUT CARDIOVERSION N/A 11/04/2017   Procedure: TRANSESOPHAGEAL ECHOCARDIOGRAM (TEE);  Surgeon: [name], MD;  Location: [facility] OR;  Service: Open Heart Surgery;  Laterality: N/A;  . TRANSCATHETER AORTIC VALVE REPLACEMENT, TRANSFEMORAL N/A 11/04/2017   Procedure: TRANSCATHETER AORTIC VALVE REPLACEMENT, TRANSFEMORAL;  Surgeon: [name], MD;  Location: [facility] OR;  Service: Open Heart Surgery;  Laterality: N/A;    Current Outpatient Medications  Medication Sig Dispense Refill  . albuterol (PROAIR HFA) 108 (90 BASE) MCG/ACT inhaler Inhale 2 puffs into the lungs every 6 (six) hours as needed for wheezing or shortness of breath.    Marland Kitchen amoxicillin (  AMOXIL) 500 MG tablet Take 2,000 mg (four 500 mg tablets) one hour prior to dental appointments. 4 tablet 6  . aspirin 81 MG tablet Take 81 mg by mouth daily.    Marland Kitchen. atorvastatin (LIPITOR) 40 MG tablet Take 40 mg by mouth daily.    . carvedilol (COREG) 6.25 MG tablet Take 1 tablet (6.25 mg total) by mouth 2 (two) times daily. 180 tablet 1  . clopidogrel (PLAVIX) 75 MG tablet Take 1 tablet (75 mg total) by mouth daily with breakfast. 30 tablet 3  . Fluticasone-Salmeterol (ADVAIR) 250-50  MCG/DOSE AEPB Inhale 1 puff into the lungs 2 (two) times daily.    Marland Kitchen. losartan (COZAAR) 25 MG tablet Take 1 tablet (25 mg total) by mouth daily. 30 tablet 3  . pantoprazole (PROTONIX) 40 MG tablet Take 1 tablet (40 mg total) by mouth daily. 30 tablet 6  . spironolactone (ALDACTONE) 25 MG tablet Take 1 tablet (25 mg total) by mouth daily. 30 tablet 0  . tamsulosin (FLOMAX) 0.4 MG CAPS capsule Take 0.4 mg by mouth daily.    Marland Kitchen. tiotropium (SPIRIVA) 18 MCG inhalation capsule Place 18 mcg into inhaler and inhale daily.     No current facility-administered medications for this visit.     Allergies:   Lisinopril; Dobutamine; Amlodipine; and Metoprolol   Social History:  The patient  reports that he quit smoking about 28 years ago. His smoking use included cigarettes. He quit after 0.00 years of use. He has quit using smokeless tobacco.  His smokeless tobacco use included snuff. He reports current alcohol use. He reports that he does not use drugs.   Family History:  The patient's  family history includes Heart disease in his father; Heart failure in his father; Hyperlipidemia in his mother; Hypertension in his mother.   ROS:  Please see the history of present illness.   All other systems are personally reviewed and negative.    Exam:    Vital Signs:  BP 118/83   Pulse 86   Well appearing, alert and conversant, regular work of breathing,  good skin color Eyes- anicteric, neuro- grossly intact, skin- no apparent rash or lesions or cyanosis, mouth- oral mucosa is pink   Labs/Other Tests and Data Reviewed:    Recent Labs: 06/04/2018: ALT 10; BUN 11; Creatinine, Ser 0.81; Potassium 3.9; Sodium 142   Wt Readings from Last 3 Encounters:  11/04/18 200 lb 3.2 oz (90.8 kg)  09/24/18 210 lb (95.3 kg)  06/04/18 206 lb (93.4 kg)     Other studies personally reviewed: Additional studies/ records that were reviewed today include: my prior office notes  Review of the above records today demonstrates:  as above  Last device remote is reviewed from [employer] PDF dated 12/09/2018 which reveals normal device function, no arrhythmias    ASSESSMENT & PLAN:    1.  Chronic systolic dysfunction/ LBBB No CHF symptoms Doing reasonably well reduce diaphragmatic stim with recent reprogramming Follows with Randon GoldsmithLaurie Short for ICM device clinic  2. Prior VF arrest No recent arrhythmias  Doing well  3. COVID 19 screen The patient denies symptoms of COVID 19 at this time.  The importance of social distancing was discussed today.  Follow-up:  With EP NP in 6 months Next remote: 02/2019  Current medicines are reviewed at length with the patient today.   The patient does not have concerns regarding his medicines.  The following changes were made today:  none  Labs/ tests ordered today include:  No  orders of the defined types were placed in this encounter.    Patient Risk:  after full review of this patients clinical status, I feel that they are at moderate risk at this time.  Today, I have spent 8 minutes with the patient with telehealth technology discussing CHF .    Randolm Idol, MD  12/29/2018 11:54 AM     [facility] [address] [place] [address] [phone] (office) [phone] (fax)

## 2019-01-04 ENCOUNTER — Encounter: Payer: Medicare Other | Admitting: Internal Medicine

## 2019-01-05 ENCOUNTER — Other Ambulatory Visit: Payer: Self-pay

## 2019-01-05 MED ORDER — PANTOPRAZOLE SODIUM 40 MG PO TBEC: 40.0000 mg | DELAYED_RELEASE_TABLET | Freq: Every day | ORAL | 0 refills | Status: DC

## 2019-02-02 ENCOUNTER — Other Ambulatory Visit: Payer: Self-pay

## 2019-02-02 MED ORDER — PANTOPRAZOLE SODIUM 40 MG PO TBEC: 40.0000 mg | DELAYED_RELEASE_TABLET | Freq: Every day | ORAL | 0 refills | Status: DC

## 2019-02-02 NOTE — Telephone Encounter (Signed)
Refill sent for pantoprazole 40 mg one tablet daily. The patient will need a follow up due to the Covid 19 virus by telephone or video only visit.

## 2019-02-08 ENCOUNTER — Ambulatory Visit (INDEPENDENT_AMBULATORY_CARE_PROVIDER_SITE_OTHER): Payer: Medicare Other

## 2019-02-08 ENCOUNTER — Other Ambulatory Visit: Payer: Self-pay

## 2019-02-08 DIAGNOSIS — I5022 Chronic systolic (congestive) heart failure: Secondary | ICD-10-CM

## 2019-02-08 DIAGNOSIS — Z95810 Presence of automatic (implantable) cardiac defibrillator: Secondary | ICD-10-CM | POA: Diagnosis not present

## 2019-02-10 NOTE — Progress Notes (Signed)
EPIC Encounter for ICM Monitoring  Patient Name: Benjamin Frost is a 78 y.o. male Date: 02/10/2019 Primary Care Physican: Leanna Sato, MD Primary Cardiologist: Kirke Corin Electrophysiologist: Allred Bi-V Pacing:  98 %          02/10/2019 Weight: 195 lbs                                                           Patient reports he is feeling fine but did notice a few days ago he felt a little off but no specific symptoms.  He did have a lot of urine output 02/08/2019 and advised that he was more than likely eliminating extra fluid that showed on report 5/16-5/17.    Corvue Thoracic impedance normal but report was suggestive of fluid accumulation during 4/22 - 4/28 and  5/16-5/17.   Prescribed: Spironolactone 25 mg take 1 tablet daily.  Recommendations:  No changes. Discussed limiting salt intake and typical foods that contain high amount of salt.   Follow-up plan: ICM clinic phone appointment on 6/22/20200.      Copy of ICM check sent to [name].   Direct Trend Viewer through 02/09/2019    3 month ICM trend: 02/07/2019    1 Year ICM trend:       Karie Soda, RN 02/10/2019 9:19 AM

## 2019-03-04 ENCOUNTER — Telehealth: Payer: Self-pay | Admitting: Cardiovascular Disease

## 2019-03-04 ENCOUNTER — Other Ambulatory Visit: Payer: Self-pay

## 2019-03-04 MED ORDER — PANTOPRAZOLE SODIUM 40 MG PO TBEC: 40.0000 mg | DELAYED_RELEASE_TABLET | Freq: Every day | ORAL | 0 refills | Status: DC

## 2019-03-04 NOTE — Telephone Encounter (Signed)
Pending

## 2019-03-15 ENCOUNTER — Ambulatory Visit (INDEPENDENT_AMBULATORY_CARE_PROVIDER_SITE_OTHER): Payer: Medicare Other | Admitting: *Deleted

## 2019-03-15 DIAGNOSIS — I5022 Chronic systolic (congestive) heart failure: Secondary | ICD-10-CM

## 2019-03-15 DIAGNOSIS — I428 Other cardiomyopathies: Secondary | ICD-10-CM | POA: Diagnosis not present

## 2019-03-15 LAB — CUP PACEART REMOTE DEVICE CHECK
Battery Remaining Longevity: 57 mo
Battery Remaining Percentage: 69 %
Battery Voltage: 2.96 V
Brady Statistic AP VP Percent: 1 %
Brady Statistic AP VS Percent: 1 %
Brady Statistic AS VP Percent: 98 %
Brady Statistic AS VS Percent: 1.3 %
Brady Statistic RA Percent Paced: 1 %
Date Time Interrogation Session: 20200622060028
HighPow Impedance: 88 Ohm
HighPow Impedance: 88 Ohm
Implantable Lead Implant Date: 20180906
Implantable Lead Implant Date: 20180906
Implantable Lead Implant Date: 20180906
Implantable Lead Location: 753858
Implantable Lead Location: 753859
Implantable Lead Location: 753860
Implantable Pulse Generator Implant Date: 20180906
Lead Channel Impedance Value: 1225 Ohm
Lead Channel Impedance Value: 530 Ohm
Lead Channel Impedance Value: 540 Ohm
Lead Channel Pacing Threshold Amplitude: 0.5 V
Lead Channel Pacing Threshold Amplitude: 0.5 V
Lead Channel Pacing Threshold Amplitude: 2.25 V
Lead Channel Pacing Threshold Pulse Width: 0.5 ms
Lead Channel Pacing Threshold Pulse Width: 0.5 ms
Lead Channel Pacing Threshold Pulse Width: 1 ms
Lead Channel Sensing Intrinsic Amplitude: 11.6 mV
Lead Channel Sensing Intrinsic Amplitude: 3.6 mV
Lead Channel Setting Pacing Amplitude: 2 V
Lead Channel Setting Pacing Amplitude: 2.5 V
Lead Channel Setting Pacing Amplitude: 2.5 V
Lead Channel Setting Pacing Pulse Width: 0.5 ms
Lead Channel Setting Pacing Pulse Width: 1 ms
Lead Channel Setting Sensing Sensitivity: 0.5 mV
Pulse Gen Serial Number: 7421078

## 2019-03-16 ENCOUNTER — Ambulatory Visit (INDEPENDENT_AMBULATORY_CARE_PROVIDER_SITE_OTHER): Payer: Medicare Other

## 2019-03-16 DIAGNOSIS — I5022 Chronic systolic (congestive) heart failure: Secondary | ICD-10-CM

## 2019-03-16 DIAGNOSIS — Z95810 Presence of automatic (implantable) cardiac defibrillator: Secondary | ICD-10-CM | POA: Diagnosis not present

## 2019-03-19 ENCOUNTER — Telehealth: Payer: Self-pay

## 2019-03-19 NOTE — Progress Notes (Signed)
EPIC Encounter for ICM Monitoring  Patient Name: Benjamin Frost is a 79 y.o. male Date: 03/19/2019 Primary Care Physican: Marguerita Merles, MD Primary Cardiologist:Arida Electrophysiologist:Allred Bi-V Pacing:98% 02/10/2019 Weight: 195 lbs   Attempted call to patient and unable to reach.   Transmission reviewed.   Corvue Thoracic impedance normal but was suggesting of possible fluid accumulation 5/28 - 6/5.  Prescribed:Spironolactone 25 mg take 1 tablet daily.  Recommendations:Unable to reach.    Follow-up plan: ICM clinic phone appointment on7/27/20200. Office visit with Ignacia Bayley, NP 03/23/2019.  Copy of ICM check sent to Dr.Allred.   3 month ICM trend: 03/15/2019    1 Year ICM trend:       Rosalene Billings, RN 03/19/2019 10:39 AM

## 2019-03-19 NOTE — Telephone Encounter (Signed)
Remote ICM transmission received.  Attempted call to patient regarding ICM remote transmission and no message left. 

## 2019-03-22 ENCOUNTER — Telehealth: Payer: Self-pay | Admitting: Cardiovascular Disease

## 2019-03-22 NOTE — Telephone Encounter (Signed)
Patient returning call.

## 2019-03-22 NOTE — Telephone Encounter (Signed)

## 2019-03-23 ENCOUNTER — Other Ambulatory Visit: Payer: Self-pay

## 2019-03-23 ENCOUNTER — Encounter: Payer: Self-pay | Admitting: Nurse Practitioner

## 2019-03-23 ENCOUNTER — Ambulatory Visit: Payer: Medicare Other | Admitting: Nurse Practitioner

## 2019-03-23 VITALS — BP 128/80 | HR 70 | Ht 70.0 in | Wt 202.0 lb

## 2019-03-23 DIAGNOSIS — Z952 Presence of prosthetic heart valve: Secondary | ICD-10-CM

## 2019-03-23 DIAGNOSIS — E782 Mixed hyperlipidemia: Secondary | ICD-10-CM

## 2019-03-23 DIAGNOSIS — I5022 Chronic systolic (congestive) heart failure: Secondary | ICD-10-CM

## 2019-03-23 DIAGNOSIS — I10 Essential (primary) hypertension: Secondary | ICD-10-CM

## 2019-03-23 DIAGNOSIS — I428 Other cardiomyopathies: Secondary | ICD-10-CM

## 2019-03-23 MED ORDER — FUROSEMIDE 20 MG PO TABS: 20.0000 mg | ORAL_TABLET | Freq: Every day | ORAL | 3 refills | Status: DC | PRN

## 2019-03-23 NOTE — Patient Instructions (Signed)
Medication Instructions:  Your physician has recommended you make the following change in your medication:  1- TAKE AS NEEDED - Furosemide 20 mg (1 tablet) by mouth once a day as needed for weight gain of 2 lbs.   If you need a refill on your cardiac medications before your next appointment, please call your pharmacy.   Lab work: - None ordered.  If you have labs (blood work) drawn today and your tests are completely normal, you will receive your results only by: Marland Kitchen MyChart Message (if you have MyChart) OR . A paper copy in the mail If you have any lab test that is abnormal or we need to change your treatment, we will call you to review the results.  Testing/Procedures: - None ordered.   Follow-Up: At [facility], you and your health needs are our priority.  As part of our continuing mission to provide you with exceptional heart care, we have created designated Provider Care Teams.  These Care Teams include your primary Cardiologist (physician) and Advanced Practice Providers (APPs -  Physician Assistants and Nurse Practitioners) who all work together to provide you with the care you need, when you need it. You will need a follow up appointment in 3 months.  Please call our office 2 months in advance to schedule this appointment.  You may see Kathlyn Sacramento, MD or one of the following Advanced Practice Providers on your designated Care Team:   Murray Hodgkins, NP Christell Faith, PA-C . Marrianne Mood, PA-C

## 2019-03-23 NOTE — Progress Notes (Signed)
Office Visit    Patient Name: Benjamin Frost Date of Encounter: 03/23/2019  Primary Care Provider:  Leanna Sato, MD Primary Cardiologist:  Lorine Bears, MD  Chief Complaint    78 year old male with a history of nonischemic cardiomyopathy/HFrEF status post [facility] Jude CRT-D, aortic stenosis status post TAVR in February 2019, hypertension, hyperlipidemia, left bundle branch block, COPD, and GERD, who presents for follow-up related to heart failure and mild volume overload.  Past Medical History    Past Medical History:  Diagnosis Date  . AICD (automatic cardioverter/defibrillator) present    a. St Jude, placed after VF arrest during a dobuatamine echo.   . Aortic stenosis, severe    a. 11/04/17: s/p TAVR by [name] and [name]; b. 10/2018 Echo: 20mm Edwards Sapien bioprosthetic AoV. Mean grad (down from on prior study).  . Chronic systolic heart failure (HCC)    a. 2015 EF 10-15%; b. 10/2018 Echo: EF 40-45%, nl RV fxn. Mildly dil LA.  Marland Kitchen COPD (chronic obstructive pulmonary disease) (HCC)   . GERD (gastroesophageal reflux disease)   . History of BPH   . History of kidney stones   . Hyperlipidemia   . Hypertension   . Left bundle branch block   . NICM (nonischemic cardiomyopathy) (HCC)    a. 2015 EF 10-15%; b. 03/2014 cath: nl cors; c. S/p SJM CRT-D; d. 10/2018 Echo: EF 40-45%.  . S/P TAVR (transcatheter aortic valve replacement)    a. 10/2017: s/p TAVR with an Edwards Sapien 3 THV (size 26 mm, model # 9600TFX, serial # [id number])  . Sinus tachycardia    Past Surgical History:  Procedure Laterality Date  . 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 CARDIAC CATHETERIZATION  2012   [facility]  . CARDIAC CATHETERIZATION     [facility]  . CATARACT EXTRACTION, BILATERAL    . LEFT AND RIGHT HEART CATHETERIZATION WITH CORONARY ANGIOGRAM N/A 03/23/2014   Procedure: LEFT AND RIGHT HEART  CATHETERIZATION WITH CORONARY ANGIOGRAM;  Surgeon: [name], MD;  Location: [facility] CATH LAB;  Service: Cardiovascular;  Laterality: N/A;  . LEG SURGERY Left    surgery d/t injury from nail gun  . RIGHT/LEFT HEART CATH AND CORONARY ANGIOGRAPHY N/A 05/16/2017   Procedure: RIGHT/LEFT HEART CATH AND CORONARY ANGIOGRAPHY;  Surgeon: [name], MD;  Location: [facility] INVASIVE CV LAB;  Service: Cardiovascular;  Laterality: N/A;  . TEE WITHOUT CARDIOVERSION N/A 11/04/2017   Procedure: TRANSESOPHAGEAL ECHOCARDIOGRAM (TEE);  Surgeon: [name], MD;  Location: [facility] OR;  Service: Open Heart Surgery;  Laterality: N/A;  . TRANSCATHETER AORTIC VALVE REPLACEMENT, TRANSFEMORAL N/A 11/04/2017   Procedure: TRANSCATHETER AORTIC VALVE REPLACEMENT, TRANSFEMORAL;  Surgeon: [name], MD;  Location: [facility] OR;  Service: Open Heart Surgery;  Laterality: N/A;    Allergies  Allergies  Allergen Reactions  . Lisinopril Hives and Other (See Comments)      Hives  . Dobutamine     VF arrest during dobutamine echo  . Amlodipine Other (See Comments)    UNSPECIFIED REACTION  unknown  . Metoprolol Nausea And Vomiting and Other (See Comments)      Nausea and vomiting     History of Present Illness    78 year old male with the above complex past medical history including nonischemic cardiomyopathy and HFrEF status post [facility] CRT-D, aortic stenosis status post TAVR in February 2019, hypertension, hyperlipidemia, left bundle branch block,  COPD, and GERD.As noted in the past medical history, he was diagnosed with a nonischemic cardiomyopathy in July 2015, at which time his EF was 10 to 15%.  Catheterization showed only mildly elevated filling pressures with normal cardiac output and no significant pulmonary hypertension.  Coronary angiography showed no obstructive CAD.  He subsequently underwent placement of CRT-D and has been followed closely by electrophysiology.  In the setting of progressive aortic  stenosis, he was evaluated by our structural heart team and underwent successful TAVR in February 2019.  Post procedure echo in March 2019 showed improvement in LV function to 45 to 50% with normal functioning aortic valve.  Follow-up echo in February of this year, shows an EF of 40 to 45% with a mean aortic valve gradient of 15 mmHg which was down from 21 mmHg last year.  He has been seen in device and electrophysiology clinic on several occasions since his last general cardiology visit.  Overall, he notes that he has done reasonably well.  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  He weighs himself about 3 times a week and says that he typically runs about 195 completely naked.  Recently, he has felt that maybe he has had some increase in volume though he has not had any significant edema.  He does have chronic dyspnea on exertion which he attributes to his COPD.  His weight has crept up slightly recently and he is 202 pounds on our scale today.  Recent evaluation of CorVue impedance monitoring was normal but trending toward mild volume excess.  Patient believes this is accurate based on the weight has been feeling.  He denies chest pain, palpitations, PND, orthopnea, dizziness, syncope, or early satiety.  He reports compliance with his medications but also notes that he eats fast food almost every day as his wife does not cook and at his part-time job, they will frequently bring fast food for lunch.  Home Medications    Prior to Admission medications   Medication Sig Start Date End Date Taking? Authorizing Provider  albuterol (PROAIR HFA) 108 (90 BASE) MCG/ACT inhaler Inhale 2 puffs into the lungs every 6 (six) hours as needed for wheezing or shortness of breath.   Yes [provider]  amoxicillin (AMOXIL) 500 MG tablet Take 2,000 mg (four 500 mg tablets) one hour prior to dental appointments. 11/12/17  Yes Eileen Stanford, PA-C  aspirin 81 MG tablet Take 81 mg by mouth daily.    Yes [provider]  atorvastatin (LIPITOR) 40 MG tablet Take 40 mg by mouth daily.   Yes [provider]  carvedilol (COREG) 6.25 MG tablet Take 1 tablet (6.25 mg total) by mouth 2 (two) times daily. 09/10/18  Yes Wellington Hampshire, MD  clopidogrel (PLAVIX) 75 MG tablet Take 1 tablet (75 mg total) by mouth daily with breakfast. 04/30/18  Yes Wellington Hampshire, MD  Fluticasone-Salmeterol (ADVAIR) 250-50 MCG/DOSE AEPB Inhale 1 puff into the lungs 2 (two) times daily.   Yes [provider]  losartan (COZAAR) 25 MG tablet Take 1 tablet (25 mg total) by mouth daily. 06/16/17  Yes Gollan, Kathlene November, MD  pantoprazole (PROTONIX) 40 MG tablet Take 1 tablet (40 mg total) by mouth daily. 03/04/19  Yes Wellington Hampshire, MD  spironolactone (ALDACTONE) 25 MG tablet Take 1 tablet (25 mg total) by mouth daily. 07/17/17  Yes Wellington Hampshire, MD  tamsulosin (FLOMAX) 0.4 MG CAPS capsule Take 0.4 mg by mouth daily.  Yes [provider]  tiotropium (SPIRIVA) 18 MCG inhalation capsule Place 18 mcg into inhaler and inhale daily.   Yes [provider]    Review of Systems    Chronic dyspnea on exertion which is been stable.  He says he has felt like he might have a little excess fluid but is not able to be more specific than that.  He denies chest pain, palpitations, PND, orthopnea, dizziness, syncope, edema, or early satiety.  All other systems reviewed and are otherwise negative except as noted above.  Physical Exam    VS:  BP 128/80 (BP Location: Left Arm, Patient Position: Sitting, Cuff Size: Normal)   Pulse 70   Ht 5\' 10"  (1.778 m)   Wt 202 lb (91.6 kg)   BMI 28.98 kg/m  , BMI Body mass index is 28.98 kg/m. GEN: Well nourished, well developed, in no acute distress. HEENT: normal. Neck: Supple, no JVD, carotid bruits, or masses. Cardiac: RRR, 2/6 systolic ejection murmur at the upper sternal borders, no rubs, or gallops. No clubbing, cyanosis, trace bilateral  ankle edema.  Radials/DP/PT 1+ and equal bilaterally.  Respiratory:  Respirations regular and unlabored, faint bibasilar crackles with diminished breath sounds overall. GI: Soft, nontender, nondistended, BS + x 4. MS: no deformity or atrophy. Skin: warm and dry, no rash. Neuro:  Strength and sensation are intact. Psych: Normal affect.  Accessory Clinical Findings    ECG personally reviewed by me today -atrial sensed, V paced, 70 - no acute changes.  Lab Results  Component Value Date   CREATININE 0.81 06/04/2018   BUN 11 06/04/2018   NA 142 06/04/2018   K 3.9 06/04/2018   CL 102 06/04/2018   CO2 25 06/04/2018    Assessment & Plan    1.  Chronic systolic congestive heart failure/nonischemic cardiomyopathy: Overall, patient has done well though does have chronic dyspnea on exertion.  CorVue impedance has recently been trending toward volume overload and patient's weight is up a few pounds on his home scale and 2 pounds on our scale.  On exam, he has trace ankle edema and bibasilar crackles.  He is not interested in taking a daily diuretic and historically has not needed this.  He consumes quite a bit of salt in his diet as he regularly has fast food or restaurant food for lunch and sometimes even dinner.  We discussed this at length today.  Due to limitations in preparing food for him and his wife, he does not think that he will be able to change his diet dramatically.  He agreed to take a prescription for Lasix 20 mg daily as needed for weight gain of 2 pounds or greater.  I suggested that he take a dose tomorrow.  I also advised that if he takes more than 2 doses a week, he needs to contact us as we would need to follow-up labs.  He otherwise remains on beta-blocker, ARB, and Spironolactone therapy.  2.  History of aortic stenosis status post TAVR: Stable valve function by echo in February.  3.  Essential hypertension: Stable on beta-blocker, ARB, and Spironolactone therapy.  4.  Mixed  hyperlipidemia: Remains on statin therapy.  LDL was 75 with normal LFTs last September.  5.  Disposition: Follow-up in clinic in 3 months or sooner if necessary.   Nicolasa Duckinghristopher Kaelum Kissick, NP 03/23/2019, 12:55 PM

## 2019-03-23 NOTE — Progress Notes (Signed)
Remote ICD transmission.   

## 2019-03-24 NOTE — Addendum Note (Signed)
Addended by: Alba Destine on: 03/24/2019 09:51 AM   Modules accepted: Orders

## 2019-04-19 ENCOUNTER — Ambulatory Visit (INDEPENDENT_AMBULATORY_CARE_PROVIDER_SITE_OTHER): Payer: Medicare Other

## 2019-04-19 DIAGNOSIS — I5022 Chronic systolic (congestive) heart failure: Secondary | ICD-10-CM | POA: Diagnosis not present

## 2019-04-19 DIAGNOSIS — Z95810 Presence of automatic (implantable) cardiac defibrillator: Secondary | ICD-10-CM

## 2019-04-20 NOTE — Progress Notes (Signed)
EPIC Encounter for ICM Monitoring  Patient Name: Benjamin Frost is a 78 y.o. male Date: 04/20/2019 Primary Care Physican: Marguerita Merles, MD Primary Cardiologist:Arida Electrophysiologist:Allred Bi-V Pacing:99% 04/20/2019 Weight: 194 lbs (baseline)   Spoke with patient.  He had 3 lb weight gain, shortness of breath and swelling of both ankles but right worse than left during decreased impedance. He took PRN Furosemide x 2 days and symptoms resolved.  He feels fine now.   CorvueThoracic impedance suggesting possible dryness since 7/24 but was suggesting of possible fluid accumulation 7/12-7/21.  Prescribed:Furosemide 20 mg take 1 tablet daily as needed for weight gain of 2 lbs or greater.  Spironolactone 25 mg take 1 tablet daily.  Recommendations:Advised since the report currently shows dryness then he needs to increase fluid intake but continue to limit to about 64 oz daily.  Advised if outside in the heat then he will need to increase fluid intake.    Follow-up plan: ICM clinic phone appointment on8/31/20200.   Copy of ICM check sent to Dr.Allred.   3 month ICM trend: 04/19/2019    1 Year ICM trend:       Rosalene Billings, RN 04/20/2019 3:21 PM

## 2019-05-24 ENCOUNTER — Ambulatory Visit (INDEPENDENT_AMBULATORY_CARE_PROVIDER_SITE_OTHER): Payer: Medicare Other

## 2019-05-24 DIAGNOSIS — Z95810 Presence of automatic (implantable) cardiac defibrillator: Secondary | ICD-10-CM | POA: Diagnosis not present

## 2019-05-24 DIAGNOSIS — I5022 Chronic systolic (congestive) heart failure: Secondary | ICD-10-CM

## 2019-05-25 NOTE — Progress Notes (Signed)
EPIC Encounter for ICM Monitoring  Patient Name: Benjamin Frost is a 78 y.o. male Date: 05/25/2019 Primary Care Physican: Marguerita Merles, MD Primary Cardiologist:Arida Electrophysiologist:Allred Bi-V Pacing:99% 05/26/2019 Weight: 194 - 195 lbs (baseline)   Spoke with patient.  His weight was up 197 lbs during decreased impedance and he took PRN Furosemide.  Weight returned to baseline.  CorvueThoracic impedance normal but was suggesting of possible fluid accumulation 8/25 - 8/30.   Prescribed:Furosemide 20 mg take 1 tablet daily as needed for weight gain of 2 lbs or greater (takes on average once a month). Spironolactone 25 mg take 1 tablet daily.  Recommendations: No changes and encouraged to call if experiencing any fluid symptoms.  Follow-up plan: ICM clinic phone appointment on 07/12/2019.   91 day device clinic remote transmission 06/15/2019.     Copy of ICM check sent to Dr. Rayann Heman.   3 month ICM trend: 05/24/2019    1 Year ICM trend:       Rosalene Billings, RN 05/25/2019 10:35 AM

## 2019-06-15 ENCOUNTER — Ambulatory Visit (INDEPENDENT_AMBULATORY_CARE_PROVIDER_SITE_OTHER): Payer: Medicare Other | Admitting: *Deleted

## 2019-06-15 DIAGNOSIS — I4901 Ventricular fibrillation: Secondary | ICD-10-CM | POA: Diagnosis not present

## 2019-06-15 DIAGNOSIS — I5022 Chronic systolic (congestive) heart failure: Secondary | ICD-10-CM

## 2019-06-15 LAB — CUP PACEART REMOTE DEVICE CHECK
Battery Remaining Longevity: 56 mo
Battery Remaining Percentage: 66 %
Battery Voltage: 2.95 V
Brady Statistic AP VP Percent: 1 %
Brady Statistic AP VS Percent: 1 %
Brady Statistic AS VP Percent: 98 %
Brady Statistic AS VS Percent: 1.1 %
Brady Statistic RA Percent Paced: 1 %
Date Time Interrogation Session: 20200922080021
HighPow Impedance: 98 Ohm
HighPow Impedance: 98 Ohm
Implantable Lead Implant Date: 20180906
Implantable Lead Implant Date: 20180906
Implantable Lead Implant Date: 20180906
Implantable Lead Location: 753858
Implantable Lead Location: 753859
Implantable Lead Location: 753860
Implantable Pulse Generator Implant Date: 20180906
Lead Channel Impedance Value: 1350 Ohm
Lead Channel Impedance Value: 590 Ohm
Lead Channel Impedance Value: 730 Ohm
Lead Channel Pacing Threshold Amplitude: 0.5 V
Lead Channel Pacing Threshold Amplitude: 0.5 V
Lead Channel Pacing Threshold Amplitude: 2.375 V
Lead Channel Pacing Threshold Pulse Width: 0.5 ms
Lead Channel Pacing Threshold Pulse Width: 0.5 ms
Lead Channel Pacing Threshold Pulse Width: 1 ms
Lead Channel Sensing Intrinsic Amplitude: 11.2 mV
Lead Channel Sensing Intrinsic Amplitude: 4.3 mV
Lead Channel Setting Pacing Amplitude: 2 V
Lead Channel Setting Pacing Amplitude: 2.5 V
Lead Channel Setting Pacing Amplitude: 2.625
Lead Channel Setting Pacing Pulse Width: 0.5 ms
Lead Channel Setting Pacing Pulse Width: 1 ms
Lead Channel Setting Sensing Sensitivity: 0.5 mV
Pulse Gen Serial Number: 7421078

## 2019-06-23 NOTE — Progress Notes (Signed)
Remote ICD transmission.   

## 2019-07-12 ENCOUNTER — Ambulatory Visit (INDEPENDENT_AMBULATORY_CARE_PROVIDER_SITE_OTHER): Payer: Medicare Other

## 2019-07-12 DIAGNOSIS — I5022 Chronic systolic (congestive) heart failure: Secondary | ICD-10-CM

## 2019-07-12 DIAGNOSIS — Z95810 Presence of automatic (implantable) cardiac defibrillator: Secondary | ICD-10-CM | POA: Diagnosis not present

## 2019-07-12 NOTE — Progress Notes (Signed)
EPIC Encounter for ICM Monitoring  Patient Name: Benjamin Frost is a 78 y.o. male Date: 07/12/2019 Primary Care Physican: Marguerita Merles, MD Primary Cardiologist:Arida Electrophysiologist:Allred Bi-V Pacing:99% 07/12/2019 Weight: 195 lbs (baseline)   Spoke with patient. His is asymptomatic and feeling fine.  He did take 1 PRN Furosemide about a week ago due to slight increased in weight but is at baseline today.  CorvueThoracic impedancesuggesting of possible fluid accumulationsince 07/10/2019.   Prescribed:Furosemide 20 mg take 1 tablet daily as needed for weight gain of 2 lbs or greater (takes on average once a month).Spironolactone 25 mg take 1 tablet daily.  Recommendations: Advised to take PRN Furosemide x 1 day.  Follow-up plan: ICM clinic phone appointment on 07/20/2019 to recheck fluid levels.   91 day device clinic remote transmission 09/14/2019.    Copy of ICM check sent to Dr. Rayann Heman and Dr Fletcher Anon.   3 month ICM trend: 07/12/2019    1 Year ICM trend:       Rosalene Billings, RN 07/12/2019 4:37 PM

## 2019-07-20 ENCOUNTER — Ambulatory Visit (INDEPENDENT_AMBULATORY_CARE_PROVIDER_SITE_OTHER): Payer: Medicare Other

## 2019-07-20 DIAGNOSIS — I5022 Chronic systolic (congestive) heart failure: Secondary | ICD-10-CM

## 2019-07-20 DIAGNOSIS — Z95810 Presence of automatic (implantable) cardiac defibrillator: Secondary | ICD-10-CM

## 2019-07-21 NOTE — Progress Notes (Signed)
EPIC Encounter for ICM Monitoring  Patient Name: Benjamin Frost is a 78 y.o. male Date: 07/21/2019 Primary Care Physican: Marguerita Merles, MD Primary Cardiologist:Arida Electrophysiologist:Allred Bi-V Pacing:99% 07/12/2019 Weight: 195lbs (baseline)   Spoke with patient.  CorvueThoracic impedancesuggesting of possible fluid accumulationsince 07/10/2019.   Prescribed:Furosemide 20 mg take 1 tablet daily as needed for weight gain of 2 lbs or greater(takes on average once a month).Spironolactone 25 mg take 1 tablet daily.  Recommendations:  No changes and encouraged to call if experiencing any fluid symptoms.  Follow-up plan: ICM clinic phone appointment on 08/16/2019.   91 day device clinic remote transmission 09/14/2019.    Copy of ICM check sent to Dr. Rayann Heman.   3 month ICM trend: 07/20/2019    1 Year ICM trend:       Rosalene Billings, RN 07/21/2019 4:45 PM

## 2019-08-16 ENCOUNTER — Ambulatory Visit (INDEPENDENT_AMBULATORY_CARE_PROVIDER_SITE_OTHER): Payer: Medicare Other

## 2019-08-16 DIAGNOSIS — I5022 Chronic systolic (congestive) heart failure: Secondary | ICD-10-CM

## 2019-08-16 DIAGNOSIS — Z95810 Presence of automatic (implantable) cardiac defibrillator: Secondary | ICD-10-CM | POA: Diagnosis not present

## 2019-08-17 NOTE — Progress Notes (Addendum)
EPIC Encounter for ICM Monitoring  Patient Name: Benjamin Frost is a 78 y.o. male Date: 08/17/2019 Primary Care Physican: Marguerita Merles, MD Primary Cardiologist:Arida Electrophysiologist:Allred Bi-V Pacing:99% 08/17/2019 Weight: 195lbs (baseline)   Spoke with patient and he took PRN Furosemide during decreased impedance.   CorvueThoracic impedancenormal but suggestive of possible fluid accumulation from 07/28/2019 - 07/31/2019 and 08/11/2019 - 08/14/2019.   Prescribed:Furosemide 20 mg take 1 tablet daily as needed for weight gain of 2 lbs or greater(takes on average once a month).Spironolactone 25 mg take 1 tablet daily.  Recommendations: None  Follow-up plan: ICM clinic phone appointment on 09/27/2019.   91 day device clinic remote transmission 09/14/2019.    Copy of ICM check sent to Dr. Rayann Heman.   3 month ICM trend: 08/16/2019    1 Year ICM trend:       Rosalene Billings, RN 08/17/2019 8:23 AM

## 2019-09-14 ENCOUNTER — Ambulatory Visit (INDEPENDENT_AMBULATORY_CARE_PROVIDER_SITE_OTHER): Payer: Medicare Other | Admitting: *Deleted

## 2019-09-14 DIAGNOSIS — I4901 Ventricular fibrillation: Secondary | ICD-10-CM

## 2019-09-14 LAB — CUP PACEART REMOTE DEVICE CHECK
Battery Remaining Longevity: 52 mo
Battery Remaining Percentage: 63 %
Battery Voltage: 2.95 V
Brady Statistic AP VP Percent: 1 %
Brady Statistic AP VS Percent: 1 %
Brady Statistic AS VP Percent: 98 %
Brady Statistic AS VS Percent: 1 %
Brady Statistic RA Percent Paced: 1 %
Date Time Interrogation Session: 20201222040017
HighPow Impedance: 86 Ohm
HighPow Impedance: 86 Ohm
Implantable Lead Implant Date: 20180906
Implantable Lead Implant Date: 20180906
Implantable Lead Implant Date: 20180906
Implantable Lead Location: 753858
Implantable Lead Location: 753859
Implantable Lead Location: 753860
Implantable Pulse Generator Implant Date: 20180906
Lead Channel Impedance Value: 1175 Ohm
Lead Channel Impedance Value: 530 Ohm
Lead Channel Impedance Value: 540 Ohm
Lead Channel Pacing Threshold Amplitude: 0.5 V
Lead Channel Pacing Threshold Amplitude: 0.5 V
Lead Channel Pacing Threshold Amplitude: 2.125 V
Lead Channel Pacing Threshold Pulse Width: 0.5 ms
Lead Channel Pacing Threshold Pulse Width: 0.5 ms
Lead Channel Pacing Threshold Pulse Width: 1 ms
Lead Channel Sensing Intrinsic Amplitude: 3.5 mV
Lead Channel Sensing Intrinsic Amplitude: 6.8 mV
Lead Channel Setting Pacing Amplitude: 2 V
Lead Channel Setting Pacing Amplitude: 2.375
Lead Channel Setting Pacing Amplitude: 2.5 V
Lead Channel Setting Pacing Pulse Width: 0.5 ms
Lead Channel Setting Pacing Pulse Width: 1 ms
Lead Channel Setting Sensing Sensitivity: 0.5 mV
Pulse Gen Serial Number: 7421078

## 2019-10-05 ENCOUNTER — Telehealth: Payer: Self-pay

## 2019-10-05 ENCOUNTER — Ambulatory Visit (INDEPENDENT_AMBULATORY_CARE_PROVIDER_SITE_OTHER): Payer: Medicare Other

## 2019-10-05 DIAGNOSIS — Z95810 Presence of automatic (implantable) cardiac defibrillator: Secondary | ICD-10-CM | POA: Diagnosis not present

## 2019-10-05 DIAGNOSIS — I5022 Chronic systolic (congestive) heart failure: Secondary | ICD-10-CM

## 2019-10-05 NOTE — Telephone Encounter (Signed)
Left message for patient to remind of missed remote transmission.  

## 2019-10-08 NOTE — Progress Notes (Signed)
EPIC Encounter for ICM Monitoring  Patient Name: Benjamin Frost is a 79 y.o. male Date: 10/08/2019 Primary Care Physican: Leanna Sato, MD Primary Cardiologist:Arida Electrophysiologist:Allred Bi-V Pacing:99% 08/17/2019 Weight: 195lbs (baseline) 10/08/2019 199 lbs   Spoke with patient and he took PRN Furosemide during decreased impedance.  He reports feeling fine at this time. 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  CorvueThoracic impedancenormal but suggestive of possible fluid accumulation from 1/7 - 1/11.   Prescribed:Furosemide 20 mg take 1 tablet daily as needed for weight gain of 2 lbs or greater(takes on average once a month).Spironolactone 25 mg take 1 tablet daily.  Recommendations: No changes and encouraged to call if experiencing any fluid symptoms.  Follow-up plan: ICM clinic phone appointment on 11/08/2019.   91 day device clinic remote transmission 12/14/2019.    Copy of ICM check sent to [name].   3 month ICM trend: 10/06/2019    1 Year ICM trend:       Karie Soda, RN 10/08/2019 3:15 PM

## 2019-11-08 ENCOUNTER — Ambulatory Visit (INDEPENDENT_AMBULATORY_CARE_PROVIDER_SITE_OTHER): Payer: Medicare Other

## 2019-11-08 DIAGNOSIS — I5022 Chronic systolic (congestive) heart failure: Secondary | ICD-10-CM | POA: Diagnosis not present

## 2019-11-08 DIAGNOSIS — Z95810 Presence of automatic (implantable) cardiac defibrillator: Secondary | ICD-10-CM

## 2019-11-10 NOTE — Progress Notes (Signed)
EPIC Encounter for ICM Monitoring  Patient Name: Benjamin Frost is a 79 y.o. male Date: 11/10/2019 Primary Care Physican: Leanna Sato, MD Primary Cardiologist:Arida Electrophysiologist:Allred Bi-V Pacing:99% 11/10/2019 Weight: 199 lbs   Spoke with patient and he reports feeling fine at this time.   Denies any fluid symptoms. 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  CorvueThoracic impedancesuggesting possible fluid accumulation starting 11/09/2019.   Prescribed:Furosemide 20 mg take 1 tablet daily as needed for weight gain of 2 lbs or greater(takes on average once a month).Spironolactone 25 mg take 1 tablet daily.  Recommendations:  Advised to take PRN Furosemide 1 tablet x 2 days.  Follow-up plan: ICM clinic phone appointment on 11/18/2019 to recheck fluid levels.   91 day device clinic remote transmission 12/14/2019.    Copy of ICM check sent to [name].   3 month ICM direct trend: 11/10/2019    1 Year ICM trend:       Karie Soda, RN 11/10/2019 1:57 PM

## 2019-11-18 ENCOUNTER — Ambulatory Visit (INDEPENDENT_AMBULATORY_CARE_PROVIDER_SITE_OTHER): Payer: Medicare Other

## 2019-11-18 DIAGNOSIS — I5022 Chronic systolic (congestive) heart failure: Secondary | ICD-10-CM

## 2019-11-18 DIAGNOSIS — Z95810 Presence of automatic (implantable) cardiac defibrillator: Secondary | ICD-10-CM

## 2019-11-19 NOTE — Progress Notes (Signed)
EPIC Encounter for ICM Monitoring  Patient Name: Benjamin Frost is a 79 y.o. male Date: 11/19/2019 Primary Care Physican: Leanna Sato, MD Primary Cardiologist:Arida Electrophysiologist:Allred Bi-V Pacing:99% 11/10/2019 Weight: 199 lbs   Spoke with patient and he reports feeling fine at this time.   Denies any fluid symptoms. 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  Wife hospitalized for ruptured appendix and will still need surgery in a few weeks once the abdominal infection has resolved.  Patient goes by [facility].  CorvueThoracic impedancereturned to normal since taken PRN Furosemide.   Prescribed:Furosemide 20 mg take 1 tablet daily as needed for weight gain of 2 lbs or greater(takes on average once a month).Spironolactone 25 mg take 1 tablet daily.  Recommendations: No changes and encouraged to call if experiencing any fluid symptoms.  Follow-up plan: ICM clinic phone appointment on3/24/2021. 91 day device clinic remote transmission 12/14/2019.   Copy of ICM check sent to Dr.Allred.   3 month ICM trend: 11/19/2019    1 Year ICM trend:       Karie Soda, RN 11/19/2019 4:38 PM

## 2019-12-14 ENCOUNTER — Ambulatory Visit (INDEPENDENT_AMBULATORY_CARE_PROVIDER_SITE_OTHER): Payer: Medicare Other | Admitting: *Deleted

## 2019-12-14 DIAGNOSIS — I4901 Ventricular fibrillation: Secondary | ICD-10-CM

## 2019-12-15 ENCOUNTER — Telehealth: Payer: Self-pay

## 2019-12-15 LAB — CUP PACEART REMOTE DEVICE CHECK
Battery Remaining Longevity: 50 mo
Battery Remaining Percentage: 59 %
Battery Voltage: 2.93 V
Brady Statistic AP VP Percent: 1 %
Brady Statistic AP VS Percent: 1 %
Brady Statistic AS VP Percent: 98 %
Brady Statistic AS VS Percent: 1 %
Brady Statistic RA Percent Paced: 1 %
Date Time Interrogation Session: 20210324104716
HighPow Impedance: 83 Ohm
HighPow Impedance: 83 Ohm
Implantable Lead Implant Date: 20180906
Implantable Lead Implant Date: 20180906
Implantable Lead Implant Date: 20180906
Implantable Lead Location: 753858
Implantable Lead Location: 753859
Implantable Lead Location: 753860
Implantable Pulse Generator Implant Date: 20180906
Lead Channel Impedance Value: 1325 Ohm
Lead Channel Impedance Value: 540 Ohm
Lead Channel Impedance Value: 600 Ohm
Lead Channel Pacing Threshold Amplitude: 0.5 V
Lead Channel Pacing Threshold Amplitude: 0.5 V
Lead Channel Pacing Threshold Amplitude: 2.375 V
Lead Channel Pacing Threshold Pulse Width: 0.5 ms
Lead Channel Pacing Threshold Pulse Width: 0.5 ms
Lead Channel Pacing Threshold Pulse Width: 1 ms
Lead Channel Sensing Intrinsic Amplitude: 4.2 mV
Lead Channel Sensing Intrinsic Amplitude: 9.2 mV
Lead Channel Setting Pacing Amplitude: 2 V
Lead Channel Setting Pacing Amplitude: 2.5 V
Lead Channel Setting Pacing Amplitude: 2.625
Lead Channel Setting Pacing Pulse Width: 0.5 ms
Lead Channel Setting Pacing Pulse Width: 1 ms
Lead Channel Setting Sensing Sensitivity: 0.5 mV
Pulse Gen Serial Number: 7421078

## 2019-12-15 NOTE — Telephone Encounter (Signed)
The pt called very agitated because someone called stating his monitor was not working. I help the pt send a manual transmission. Transmission received. I told him I do not know who called but his monitor is working. I told him the nurse will review it and if everything looks good he will not receive a phone call. She will only call if she see an issue. The pt thanked me for my help and verbalized understanding.

## 2019-12-20 ENCOUNTER — Ambulatory Visit (INDEPENDENT_AMBULATORY_CARE_PROVIDER_SITE_OTHER): Payer: Medicare Other

## 2019-12-20 DIAGNOSIS — I5022 Chronic systolic (congestive) heart failure: Secondary | ICD-10-CM

## 2019-12-20 DIAGNOSIS — Z95810 Presence of automatic (implantable) cardiac defibrillator: Secondary | ICD-10-CM

## 2019-12-20 NOTE — Progress Notes (Signed)
EPIC Encounter for ICM Monitoring  Patient Name: Benjamin Frost is a 79 y.o. male Date: 12/20/2019 Primary Care Physican: Leanna Sato, MD Primary Cardiologist:Arida Electrophysiologist:Allred Bi-V Pacing:99% 2/17/2021Weight:199 lbs   Spoke with patient andhe reports feeling fine at this time.Denies any fluid symptoms. Wife recovering from ruptured appendix and surgery scheduled 4/16 after infection has resolved.  She will then have TKR.  Patient goes by Benjamin Frost.   CorvueThoracic impedancenormal and had taken PRN Furosemide once in last week.   Prescribed:Furosemide 20 mg take 1 tablet daily as needed for weight gain of 2 lbs or greater(takes on average once a month).Spironolactone 25 mg take 1 tablet daily.  Recommendations:No changes and encouraged to call if experiencing any fluid symptoms.  Follow-up plan: ICM clinic phone appointment on4/30/2021. 91 day device clinic remote transmission 03/14/2020.   Copy of ICM check sent to Dr.Allred.  3 month ICM trend: 12/15/2019    1 Year ICM trend:       Karie Soda, RN 12/20/2019 11:10 AM

## 2019-12-23 IMAGING — CT CT CTA ABD/PEL W/CM AND/OR W/O CM
3 of 14 series · 10 of 46 positions shown, 14 images · IV contrast (APPLIED)
Comparison: 03/16/2014 chest CT. 04/07/2014 unenhanced CT
abdomen/pelvis.

CLINICAL DATA: 76-year-old male with severe symptomatic aortic
stenosis. Pre-TAVR evaluation.

EXAM:
CT ANGIOGRAPHY CHEST, ABDOMEN AND PELVIS
TECHNIQUE: Multidetector CT imaging through the chest, abdomen and pelvis was
performed using the standard protocol during bolus administration of
intravenous contrast. Multiplanar reconstructed images and MIPs were
obtained and reviewed to evaluate the vascular anatomy.
CONTRAST:  100mL 2KR3CE-J0G IOPAMIDOL (2KR3CE-J0G) INJECTION 76%

[Series 7: 0-90% · axial · 0.39mm/px · z∈[+1171,+1279]mm · 6 of 3260 slices shown]
[im 363/3260  soft-tissue]
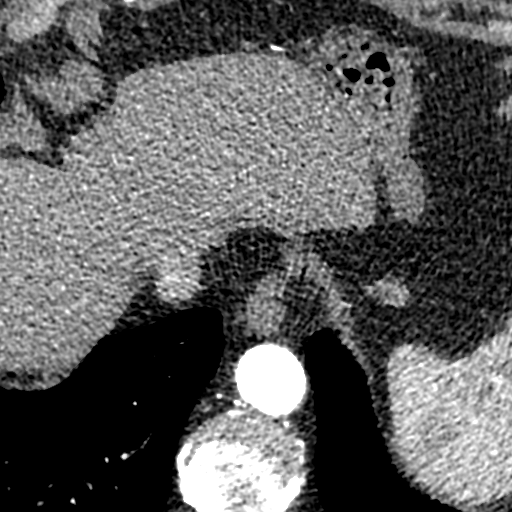
[im 725/3260  soft-tissue]
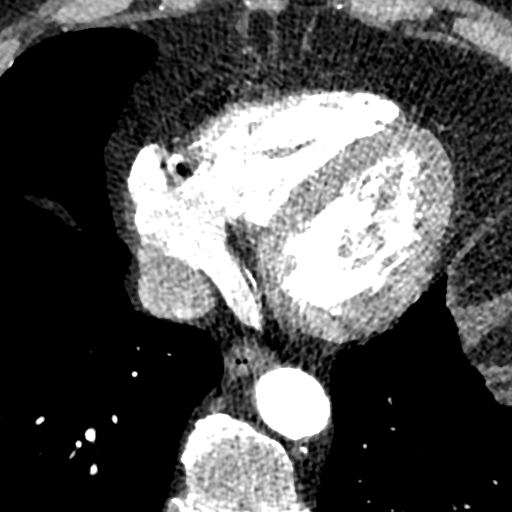
[im 1087/3260  soft-tissue]
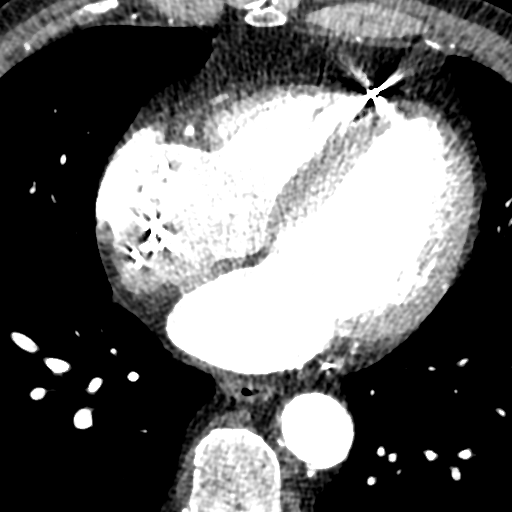
[im 1449/3260  soft-tissue]
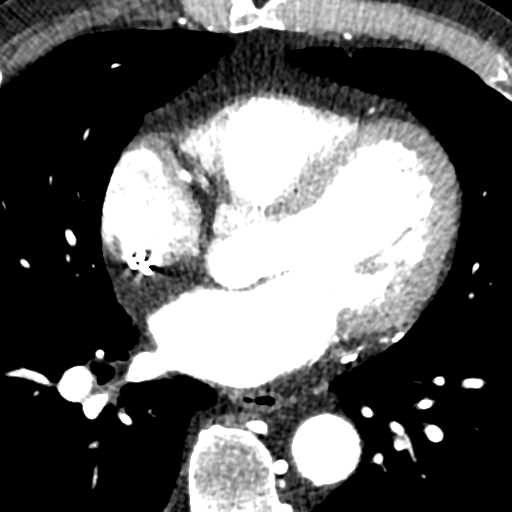
[im 1811/3260  soft-tissue]
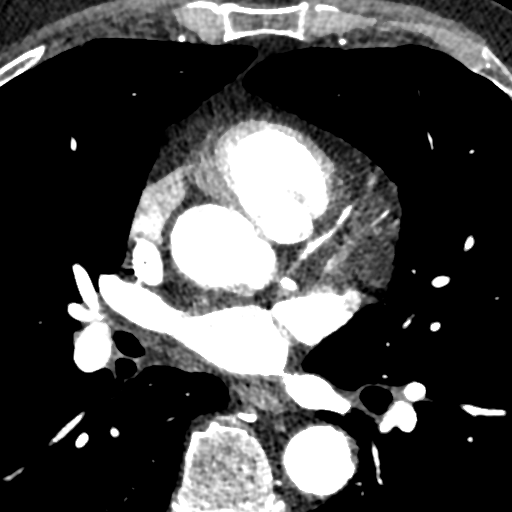
[im 2173/3260  soft-tissue]
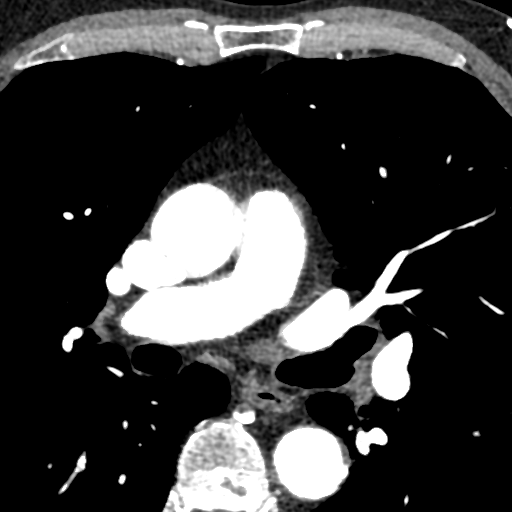

[Series 13: ax thins · axial · 0.59mm/px · z∈[+1006,+1224]mm · 2 of 656 slices shown, 5 images]
[im 219/656  soft-tissue]
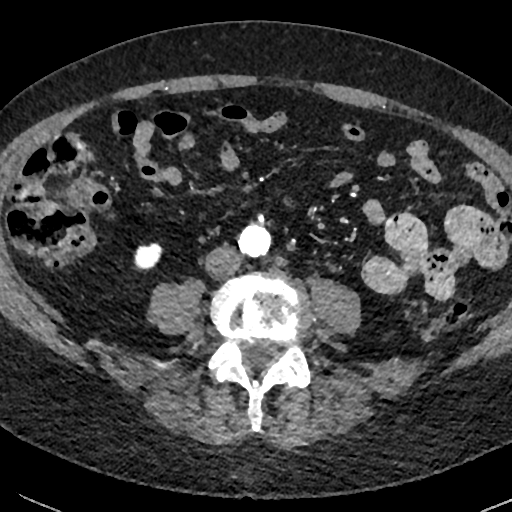
[im 219/656  lung]
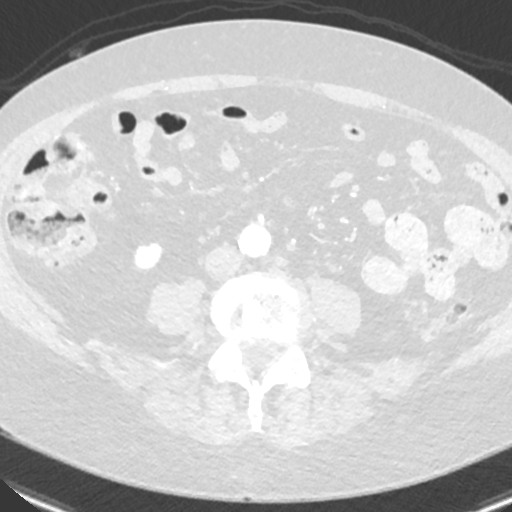
[im 219/656  bone]
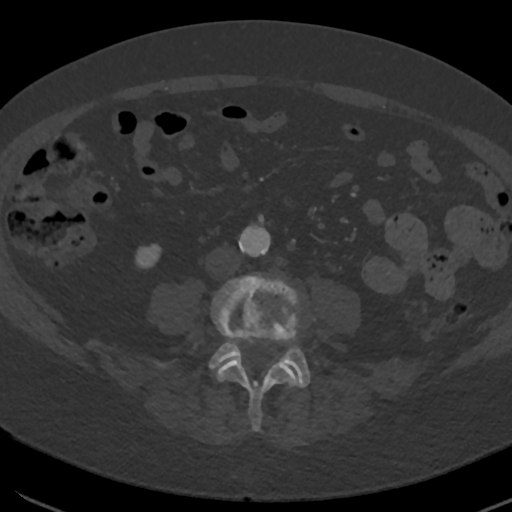
[im 437/656  soft-tissue]
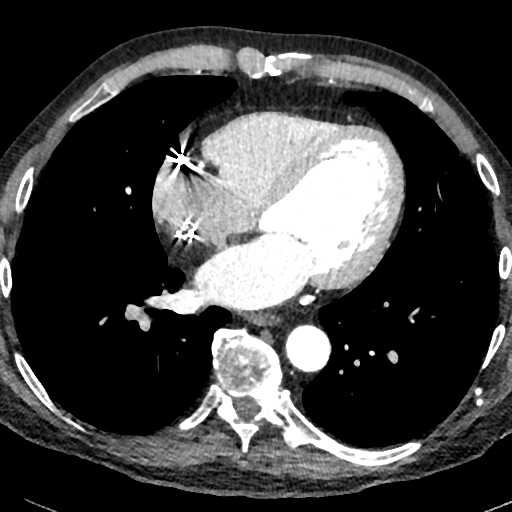
[im 437/656  lung]
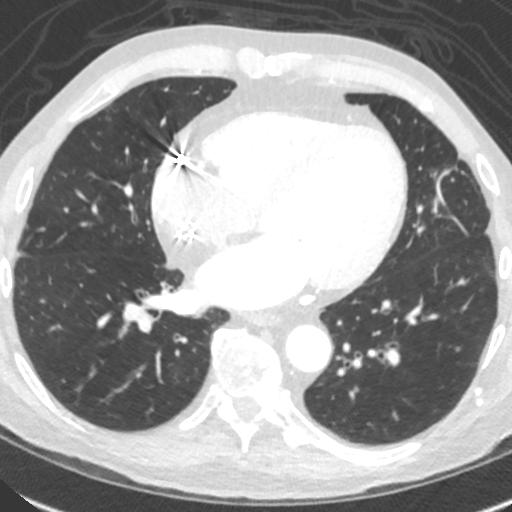

[Series 16: cor · coronal · 0.81mm/px · 2 of 164 slices shown, 3 images]
[im 55/164  soft-tissue]
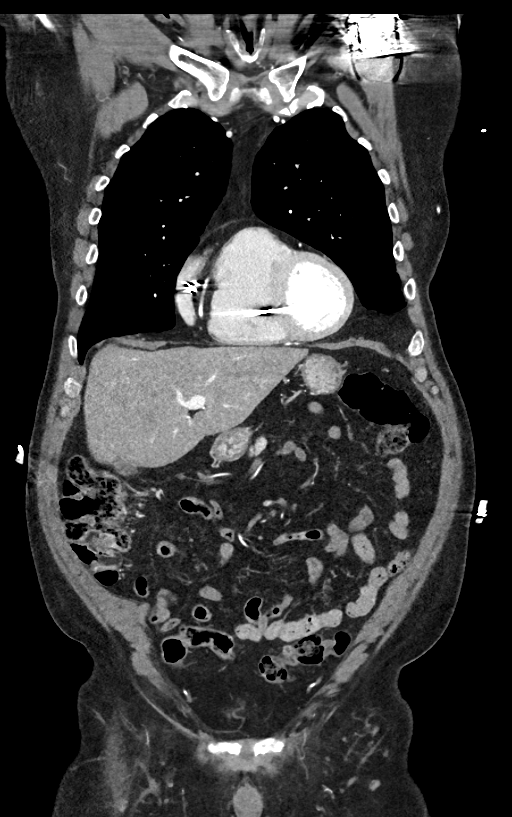
[im 55/164  bone]
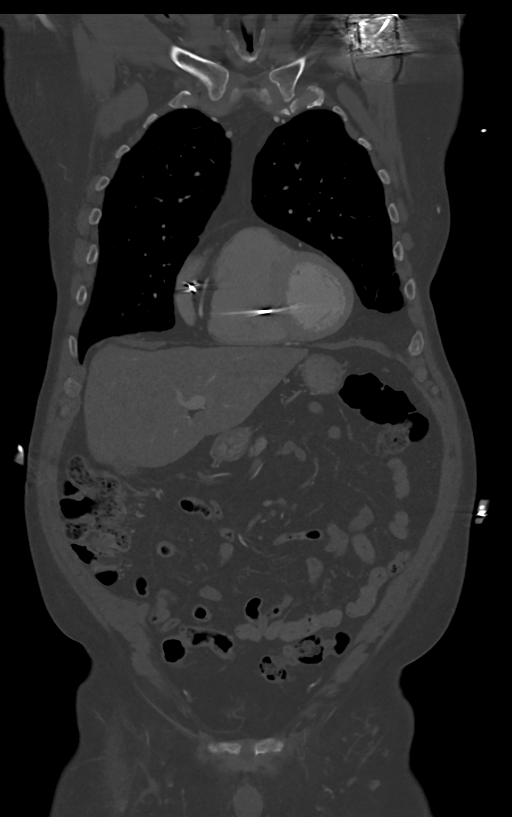
[im 109/164  soft-tissue]
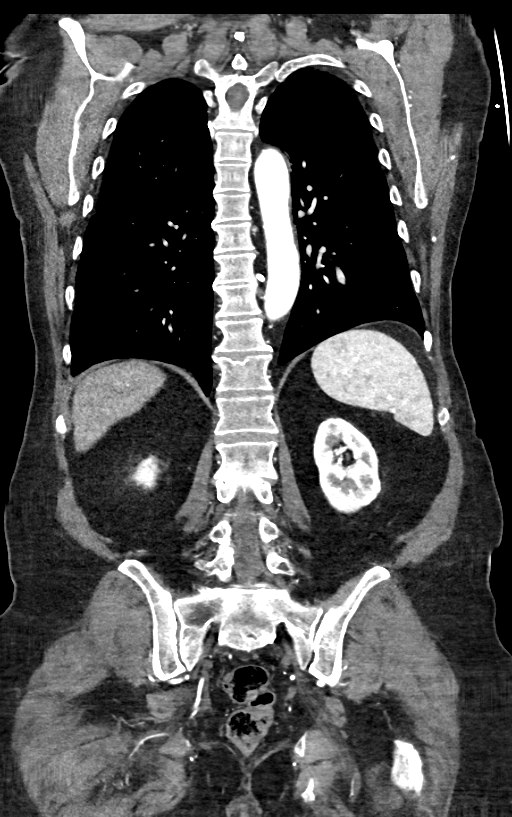

[10 of 46 positions shown; findings below may reference images not displayed]

FINDINGS: CTA CHEST FINDINGS

Cardiovascular: Top-normal heart size. No significant pericardial
fluid/thickening. Severe thickening and calcification of the aortic
valve. Left main, left anterior descending and left circumflex
coronary atherosclerosis. Three lead left subclavian ICD is noted
with lead tips in the right atrium, right ventricular apex and
coronary sinus. Atherosclerotic minimally tortuous nonaneurysmal
thoracic aorta. No evidence of acute intramural hematoma,
dissection, pseudoaneurysm or penetrating atherosclerotic ulcer in
the thoracic aorta. Aortic arch vessels are patent. Normal caliber
pulmonary arteries. No central pulmonary emboli.

Mediastinum/Nodes: No discrete thyroid nodules. Unremarkable
esophagus. No pathologically enlarged axillary, mediastinal or hilar
lymph nodes.

Lungs/Pleura: No pneumothorax. No pleural effusion. Mild
centrilobular emphysema with mild diffuse bronchial wall thickening.
No acute consolidative airspace disease, lung masses or significant
pulmonary nodules. Scattered small parenchymal bands in the mid to
lower lungs bilaterally, compatible with mild
postinfectious/postinflammatory scarring.

Musculoskeletal: No aggressive appearing focal osseous lesions. Mild
thoracic spondylosis. Nonspecific patchy sclerosis in the mid
sternum (series 17/image 96), new since 03/16/2014 chest CT, without
appreciable discrete lesion in this location. Mild gynecomastia,
asymmetric to the left, new.

CTA ABDOMEN AND PELVIS FINDINGS

Hepatobiliary: Normal liver size. Heterogeneously enhancing 0.9 cm
posterior right liver dome lesion (series 14/image 95) correlates
with a hypodense 0.9 cm lesion in this location on the 04/07/2014
unenhanced CT study, compatible with a benign lesion such as a
hemangioma. Otherwise no liver lesions. Normal gallbladder with no
radiopaque cholelithiasis. No biliary ductal dilatation.

Pancreas: Normal, with no mass or duct dilation.

Spleen: Normal size. No mass.

Adrenals/Urinary Tract: Normal adrenals. Simple 1.2 cm upper left
renal cyst. No additional contour deforming renal lesions. No
hydronephrosis. Normal bladder.

Stomach/Bowel: Normal non-distended stomach. Normal caliber small
bowel with no small bowel wall thickening. Normal appendix. Moderate
left colonic diverticulosis, most prominent in the sigmoid colon,
with no large bowel wall thickening or pericolonic fat stranding.

Vascular/Lymphatic: Atherosclerotic nonaneurysmal abdominal aorta.
Patent splenic and renal veins. No pathologically enlarged lymph
nodes in the abdomen or pelvis.

Reproductive: Mildly enlarged prostate with nonspecific coarse
internal prostatic calcifications.

Other: No pneumoperitoneum, ascites or focal fluid collection.

Musculoskeletal: No aggressive appearing focal osseous lesions.
Marked lumbar spondylosis.

VASCULAR MEASUREMENTS PERTINENT TO TAVR:

AORTA:

Minimal Aortic Ciameter-X6.L x 16.4 mm (infrarenal abdominal aorta
on series 13/image 441)

Severity of Aortic Calcification-moderate

RIGHT PELVIS:

Right Common Iliac Artery -

Minimal 4iameter-X.G x 8.3 mm

Tortuosity-mild

Calcification-moderate

Right External Iliac Artery -

Minimal Riameter-A.A x 6.5 mm

Tortuosity-severe

Calcification-none

Right Common Femoral Artery -

Minimal Eiameter-5.A x 7.2 mm

Tortuosity-mild

Calcification-none

LEFT PELVIS:

Left Common Iliac Artery -

Minimal Wiameter-M.P x 9.0 mm

Tortuosity-moderate

Calcification-mild

Left External Iliac Artery -

Minimal Qiameter-D.L x 7.4 mm

Tortuosity-moderate

Calcification-none

Left Common Femoral Artery -

Minimal Eiameter-5.A x 7.6 mm

Tortuosity-mild

Calcification-mild

Review of the MIP images confirms the above findings.
IMPRESSION: 1. Vascular findings and measurements pertinent to potential TAVR
procedure, as detailed above.
2. Severe thickening and calcification of the aortic valve,
compatible with the reported clinical history of severe aortic
stenosis.
3. Left main and two-vessel coronary atherosclerosis.
4. Mild centrilobular emphysema with mild diffuse bronchial wall
thickening, suggesting COPD.
5. Aortic Atherosclerosis (XE6H7-4BL.L) and Emphysema (XE6H7-FU0.6).
6. Moderate left colonic diverticulosis.
7. Mildly enlarged prostate.

## 2020-01-21 ENCOUNTER — Ambulatory Visit (INDEPENDENT_AMBULATORY_CARE_PROVIDER_SITE_OTHER): Payer: Medicare Other

## 2020-01-21 ENCOUNTER — Telehealth: Payer: Self-pay

## 2020-01-21 DIAGNOSIS — I5022 Chronic systolic (congestive) heart failure: Secondary | ICD-10-CM | POA: Diagnosis not present

## 2020-01-21 DIAGNOSIS — Z95810 Presence of automatic (implantable) cardiac defibrillator: Secondary | ICD-10-CM | POA: Diagnosis not present

## 2020-01-21 NOTE — Telephone Encounter (Signed)
Spoke with patient to remind of missed remote transmission 

## 2020-01-24 ENCOUNTER — Telehealth: Payer: Self-pay

## 2020-01-24 IMAGING — DX DG CHEST 1V PORT
1 series · 1 of 1 positions shown · non-contrast
Comparison: Radiographs October 31, 2017.

CLINICAL DATA: Status post transcatheter aortic valve replacement.

EXAM:
PORTABLE CHEST 1 VIEW

[chest ap]
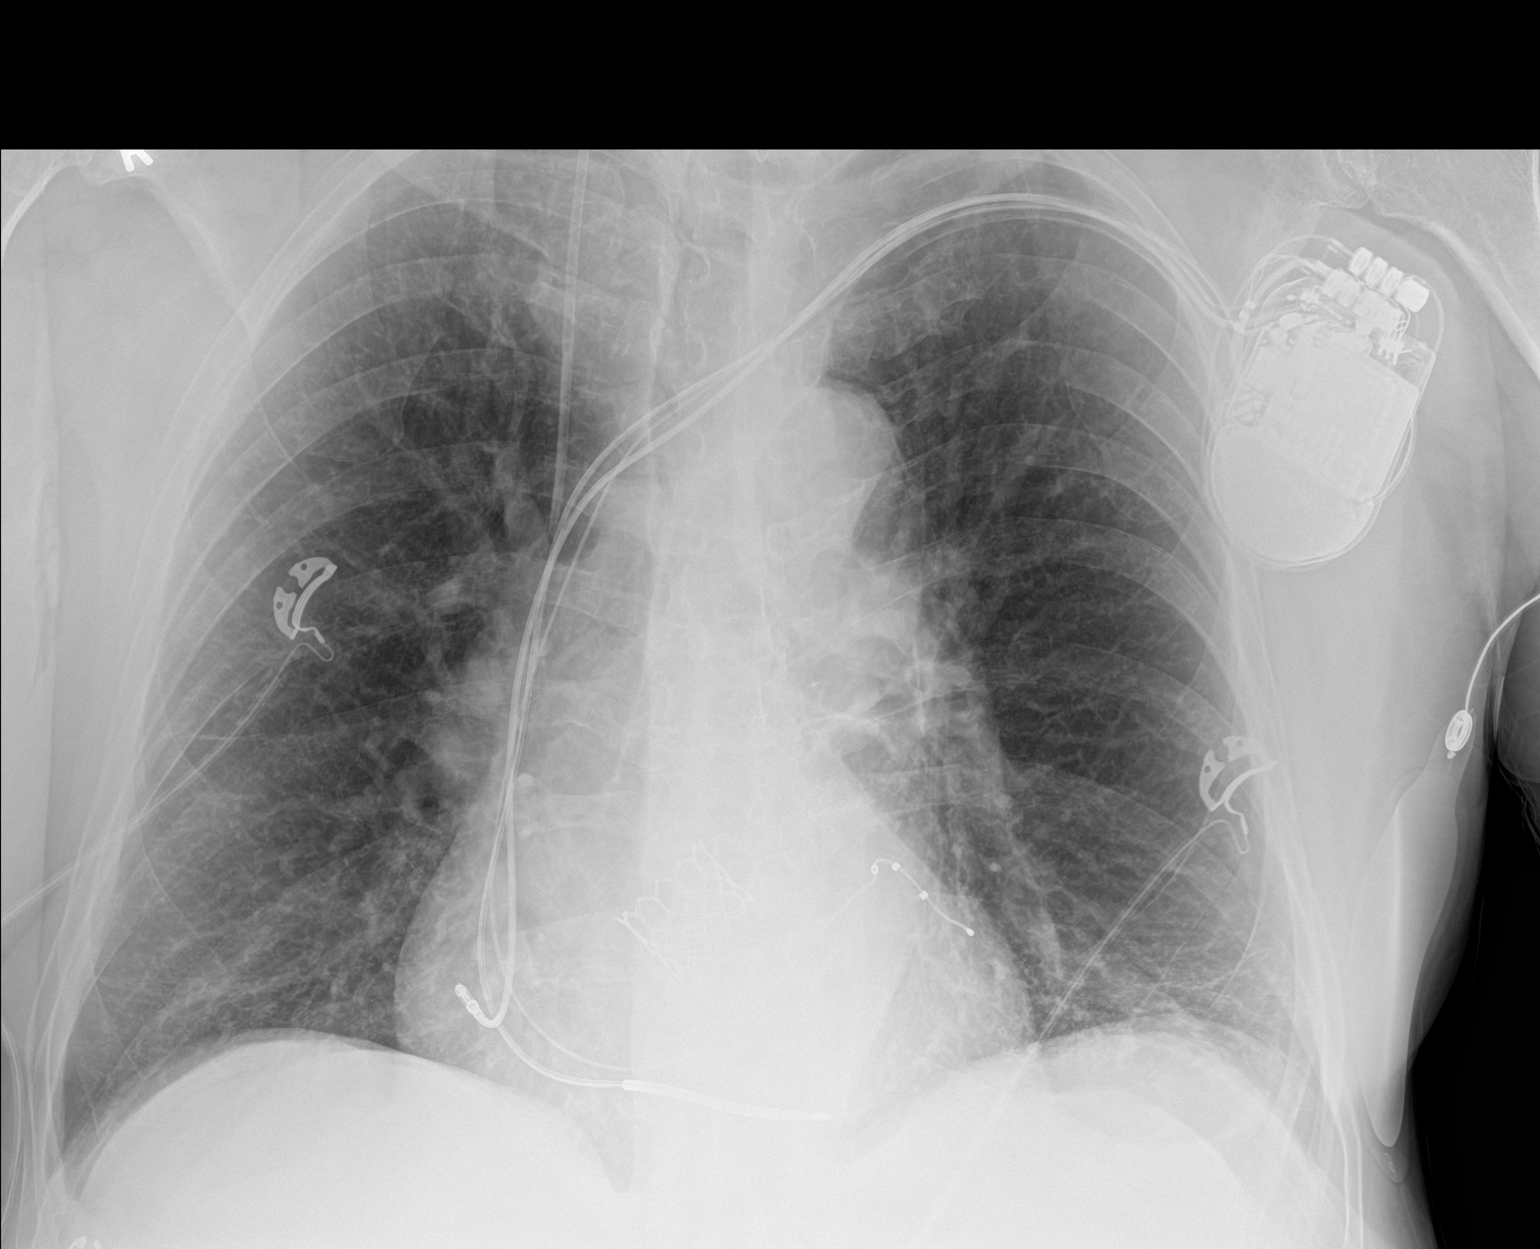

[1 of 1 positions shown; findings below may reference images not displayed]

FINDINGS: Stable cardiomediastinal silhouette. Left-sided pacemaker is
unchanged in position. Interval placement of right internal jugular
catheter with distal tip in expected position of the SVC. No
pneumothorax or pleural effusion is noted. Stable bibasilar
subsegmental atelectasis or scarring is noted. Bony thorax is
unremarkable.
IMPRESSION: Stable bibasilar subsegmental atelectasis or scarring.

## 2020-01-24 NOTE — Telephone Encounter (Signed)
Remote ICM transmission received.  Attempted call to patient regarding ICM remote transmission and left detailed message per DPR.  Advised to return call for any fluid symptoms or questions. Next ICM remote transmission scheduled 02/22/2020.     

## 2020-01-24 NOTE — Progress Notes (Signed)
EPIC Encounter for ICM Monitoring  Patient Name: Benjamin Frost is a 79 y.o. male Date: 01/24/2020 Primary Care Physican: Leanna Sato, MD Primary Cardiologist:Arida Electrophysiologist:Allred Bi-V Pacing:99% 2/17/2021Weight:199 lbs   Attempted call and left detail message regarding transmission per DPR    Wife recovering from ruptured appendix and surgery scheduled 4/16 after infection has resolved.  She will then have TKR. Patient goes by [name].   CorvueThoracic impedancenormal.   Prescribed:Furosemide 20 mg take 1 tablet daily as needed for weight gain of 2 lbs or greater(takes on average once a month).Spironolactone 25 mg take 1 tablet daily.  Recommendations:Left voice mail with ICM number and encouraged to call if experiencing any fluid symptoms.  Follow-up plan: ICM clinic phone appointment on6/09/2019. 91 day device clinic remote transmission 03/14/2020.   Copy of ICM check sent to Dr.Allred.  3 month ICM trend: 01/22/2020    1 Year ICM trend:       Karie Soda, RN 01/24/2020 5:25 PM

## 2020-01-26 NOTE — Progress Notes (Signed)
Patient returned call and doing well.  He said he took PRN Furosemide during decreased impedance and feeling fine.  He has no fluid symptoms at this time.  No changes and encouraged to call if experiencing any fluid symptoms.

## 2020-01-28 ENCOUNTER — Other Ambulatory Visit: Payer: Self-pay

## 2020-01-28 ENCOUNTER — Ambulatory Visit (INDEPENDENT_AMBULATORY_CARE_PROVIDER_SITE_OTHER): Payer: Medicare Other | Admitting: Physician Assistant

## 2020-01-28 ENCOUNTER — Encounter: Payer: Self-pay | Admitting: Physician Assistant

## 2020-01-28 VITALS — BP 130/80 | HR 75 | Ht 69.0 in | Wt 207.2 lb

## 2020-01-28 DIAGNOSIS — Z952 Presence of prosthetic heart valve: Secondary | ICD-10-CM

## 2020-01-28 DIAGNOSIS — I447 Left bundle-branch block, unspecified: Secondary | ICD-10-CM

## 2020-01-28 DIAGNOSIS — J449 Chronic obstructive pulmonary disease, unspecified: Secondary | ICD-10-CM

## 2020-01-28 DIAGNOSIS — I5022 Chronic systolic (congestive) heart failure: Secondary | ICD-10-CM | POA: Diagnosis not present

## 2020-01-28 DIAGNOSIS — E785 Hyperlipidemia, unspecified: Secondary | ICD-10-CM

## 2020-01-28 DIAGNOSIS — Z95810 Presence of automatic (implantable) cardiac defibrillator: Secondary | ICD-10-CM

## 2020-01-28 DIAGNOSIS — I10 Essential (primary) hypertension: Secondary | ICD-10-CM

## 2020-01-28 DIAGNOSIS — I428 Other cardiomyopathies: Secondary | ICD-10-CM

## 2020-01-28 NOTE — Patient Instructions (Signed)
Medication Instructions:  Your physician recommends that you continue on your current medications as directed. Please refer to the Current Medication list given to you today.  *If you need a refill on your cardiac medications before your next appointment, please call your pharmacy*   Lab Work: Bmet and Cbc today. If you have labs (blood work) drawn today and your tests are completely normal, you will receive your results only by: . MyChart Message (if you have MyChart) OR . A paper copy in the mail If you have any lab test that is abnormal or we need to change your treatment, we will call you to review the results.   Testing/Procedures: None ordered   Follow-Up: At [facility], you and your health needs are our priority.  As part of our continuing mission to provide you with exceptional heart care, we have created designated Provider Care Teams.  These Care Teams include your primary Cardiologist (physician) and Advanced Practice Providers (APPs -  Physician Assistants and Nurse Practitioners) who all work together to provide you with the care you need, when you need it.  We recommend signing up for the patient portal called "MyChart".  Sign up information is provided on this After Visit Summary.  MyChart is used to connect with patients for Virtual Visits (Telemedicine).  Patients are able to view lab/test results, encounter notes, upcoming appointments, etc.  Non-urgent messages can be sent to your provider as well.   To learn more about what you can do with MyChart, go to [email or web address].    Your next appointment:   6 month(s)  The format for your next appointment:   In Person  Provider:    You may see Muhammad Arida, MD or one of the following Advanced Practice Providers on your designated Care Team:    Christopher Berge, NP  Ryan Dunn, PA-C  Jacquelyn Visser, PA-C    Other Instructions N/A  

## 2020-01-28 NOTE — Progress Notes (Signed)
Office Visit    Patient Name: Antrone Walla Date of Encounter: 01/28/2020  Primary Care Provider:  Leanna Sato, MD Primary Cardiologist:  Lorine Bears, MD  Chief Complaint    Chief Complaint  Patient presents with  . office visit    3 month F/U; Meds verbally reviewed with patient.   79 yo male with NICM/HFrEF s/p CRT-D, AS s/p TAVR 10/2017, HTN, HLD, LBBB, COPD, and GERD, and who presents for follow-up.   Past Medical History    Past Medical History:  Diagnosis Date  . AICD (automatic cardioverter/defibrillator) present    a. St Jude, placed after VF arrest during a dobuatamine echo.   . Aortic stenosis, severe    a. 11/04/17: s/p TAVR by [name] and [name]; b. 10/2018 Echo: 90mm Edwards Sapien bioprosthetic AoV. Mean grad (down from on prior study).  . Chronic systolic heart failure (HCC)    a. 2015 EF 10-15%; b. 10/2018 Echo: EF 40-45%, nl RV fxn. Mildly dil LA.  Marland Kitchen COPD (chronic obstructive pulmonary disease) (HCC)   . GERD (gastroesophageal reflux disease)   . History of BPH   . History of kidney stones   . Hyperlipidemia   . Hypertension   . Left bundle branch block   . NICM (nonischemic cardiomyopathy) (HCC)    a. 2015 EF 10-15%; b. 03/2014 cath: nl cors; c. S/p SJM CRT-D; d. 10/2018 Echo: EF 40-45%.  . S/P TAVR (transcatheter aortic valve replacement)    a. 10/2017: s/p TAVR with an Edwards Sapien 3 THV (size 26 mm, model # 9600TFX, serial # [id number])  . Sinus tachycardia    Past Surgical History:  Procedure Laterality Date  . 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 CARDIAC CATHETERIZATION  2012   [facility]  . CARDIAC CATHETERIZATION     [facility]  . CATARACT EXTRACTION, BILATERAL    . LEFT AND RIGHT HEART CATHETERIZATION WITH CORONARY ANGIOGRAM N/A 03/23/2014   Procedure: LEFT AND RIGHT HEART CATHETERIZATION WITH CORONARY ANGIOGRAM;  Surgeon: [name], MD;   Location: [facility] CATH LAB;  Service: Cardiovascular;  Laterality: N/A;  . LEG SURGERY Left    surgery d/t injury from nail gun  . RIGHT/LEFT HEART CATH AND CORONARY ANGIOGRAPHY N/A 05/16/2017   Procedure: RIGHT/LEFT HEART CATH AND CORONARY ANGIOGRAPHY;  Surgeon: [name], MD;  Location: [facility] INVASIVE CV LAB;  Service: Cardiovascular;  Laterality: N/A;  . TEE WITHOUT CARDIOVERSION N/A 11/04/2017   Procedure: TRANSESOPHAGEAL ECHOCARDIOGRAM (TEE);  Surgeon: [name], MD;  Location[facility] OR;  Service: Open Heart Surgery;  Laterality: N/A;  . TRANSCATHETER AORTIC VALVE REPLACEMENT, TRANSFEMORAL N/A 11/04/2017   Procedure: TRANSCATHETER AORTIC VALVE REPLACEMENT, TRANSFEMORAL;  Surgeon: [name], MD;  Location: [facility] OR;  Service: Open Heart Surgery;  Laterality: N/A;    Allergies  Allergies  Allergen Reactions  . Lisinopril Hives and Other (See Comments)      Hives  . Dobutamine     VF arrest during dobutamine echo  . Amlodipine Other (See Comments)    UNSPECIFIED REACTION  unknown  . Metoprolol Nausea And Vomiting and Other (See Comments)      Nausea and vomiting     History of Present Illness    Markise Haymer is a 79 y.o. male with PMH as above. He has a history of NICM, diagnosed in 03/2014 with EF 10-15%. Catheterization showed mildly elevated filling pressures  with normal cardiac output and no significant pulmonary HTN. Coronary angiography showed no obstructive CAD. He underwent placement of St. Jude CRT-D and is followed by EP. 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 Post procedure echo 11/2017 showed improved LVSF 45-50% with normal functioning AV. 2020 echo showed EF 40-45% with mean aortic valve gradient of , which was down from 21 mm Hg the previous year. 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 He was weighing himself at home and ~195lbs. He felt as if he might be up in volume though denied significant  edema. He noted chronic DOE, attributed to his COPD. His weight was noted to have increased at 202 lbs. CorVue impedance monitoring was normal but trending towards mild volume excess. He noted eating fast food almost every day as his wife did not cook and given they frequently brought fast food for him at his part time job. He was started on PRN lasix 20mg  as needed for weight greater than 2 lbs.   Today, he returns to clinic and reports he has been doing fairly well.  He denies chest pain, palpitations, pnd, orthopnea, n, v, dizziness, syncope, weight gain, or early satiety. He reports chronic DOE but denies SOB at rests. He occasionally notes LEE, resolved with as needed lasix 20mg  and at a frequency of approximately 1x per month. He states that he receives regular device updates. He says that he finds it interesting that he is usually notified of signs of increased volume status (via his device) approximately a week after it occurs and well after he has already taken his lasix. He continues to weigh himself at home and notes stable home weight. Clinic weight today 207.4lbs, which is up from previous 03/23/2019 clinic weight of 202lbs. He reports that he does not have much control over his diet, given his wife does not cook. He does eat pre-processed foods, though he states he tries to go for the lower salt ones. Clinic BP today 130/80 with patient reporting this may be 2/2 the walk into clinic. He also admits to eating canned tomato soup the previous evening, which may be contributing to his current BP. He reports checking his BP at home with home SBP 120-125 and DBP 70-80s. He reports he is staying active and still working part time at a farm. He denies any exertional sx with this work. No s/sx of bleeding. He reports medication compliance.   Home Medications    Prior to Admission medications   Medication Sig Start Date End Date Taking? Authorizing Provider  albuterol (PROAIR HFA) 108 (90 BASE) MCG/ACT  inhaler Inhale 2 puffs into the lungs every 6 (six) hours as needed for wheezing or shortness of breath.   Yes [provider]  amoxicillin (AMOXIL) 500 MG tablet Take 2,000 mg (four 500 mg tablets) one hour prior to dental appointments. 11/12/17  Yes 03/25/2019, PA-C  aspirin 81 MG tablet Take 81 mg by mouth daily.   Yes [provider]  atorvastatin (LIPITOR) 40 MG tablet Take 40 mg by mouth daily.   Yes [provider]  carvedilol (COREG) 6.25 MG tablet Take 1 tablet (6.25 mg total) by mouth 2 (two) times daily. 09/10/18  Yes Janetta Hora, MD  clopidogrel (PLAVIX) 75 MG tablet Take 1 tablet (75 mg total) by mouth daily with breakfast. 04/30/18  Yes [name], MD  Fluticasone-Salmeterol (ADVAIR) 250-50 MCG/DOSE AEPB Inhale  1 puff into the lungs 2 (two) times daily.   Yes [provider]  furosemide (LASIX) 20 MG tablet Take 1 tablet (20 mg total) by mouth daily as needed. For weight gain greater than 2 lb. 03/23/19 01/27/29 Yes Creig Hines, NP  losartan (COZAAR) 25 MG tablet Take 1 tablet (25 mg total) by mouth daily. 06/16/17  Yes Gollan, Tollie Pizza, MD  pantoprazole (PROTONIX) 40 MG tablet Take 1 tablet (40 mg total) by mouth daily. 03/04/19  Yes [name], MD  spironolactone (ALDACTONE) 25 MG tablet Take 1 tablet (25 mg total) by mouth daily. 07/17/17  Yes [name], MD  tamsulosin (FLOMAX) 0.4 MG CAPS capsule Take 0.4 mg by mouth daily.   Yes [provider]  tiotropium (SPIRIVA) 18 MCG inhalation capsule Place 18 mcg into inhaler and inhale daily.   Yes [provider]    Review of Systems    He denies chest pain, palpitations, pnd, orthopnea, n, v, dizziness, syncope, edema, weight gain, or early satiety. He reports chronic DOE.   All other systems reviewed and are otherwise negative except as noted above.  Physical Exam    VS:  BP 130/80 (BP Location: Left Arm, Patient Position: Sitting,  Cuff Size: Normal)   Pulse 75   Ht 5\' 9"  (1.753 m)   Wt 207 lb 4 oz (94 kg)   SpO2 93%   BMI 30.61 kg/m  , BMI Body mass index is 30.61 kg/m. GEN: Well nourished, well developed, in no acute distress.  HEENT: normal. Neck: Supple, no JVD, carotid bruits, or masses. Cardiac: RRR, 2/6 systolic ejection murmur , rubs, or gallops. No clubbing, cyanosis, mild/nonpitting bilateral edema.  Radials/DP/PT 1+ and equal bilaterally.  Respiratory:  Respirations regular and unlabored, bilaterally reduced breath sounds, some right lower lobe crackles. GI: Soft, nontender, nondistended, BS + x 4. MS: no deformity or atrophy. Skin: warm and dry, no rash. Neuro:  Strength and sensation are intact. Psych: Normal affect.  Accessory Clinical Findings    ECG personally reviewed by me today -atrial sensed, V paced, 75 bpm- no acute changes.  VITALS Reviewed today   Temp Readings from Last 3 Encounters:  11/05/17 98 F (36.7 C) (Oral)  10/31/17 98.3 F (36.8 C) (Oral)  07/14/17 98.6 F (37 C) (Oral)   BP Readings from Last 3 Encounters:  01/28/20 130/80  03/23/19 128/80  12/29/18 118/83   Pulse Readings from Last 3 Encounters:  01/28/20 75  03/23/19 70  12/29/18 86    Wt Readings from Last 3 Encounters:  01/28/20 207 lb 4 oz (94 kg)  03/23/19 202 lb (91.6 kg)  11/04/18 200 lb 3.2 oz (90.8 kg)     LABS  reviewed today   CareEverwhere Labs present and most recent? Yes/No: No  Lab Results  Component Value Date   WBC 7.4 11/05/2017   HGB 12.9 (L) 11/05/2017   HCT 39.2 11/05/2017   MCV 93.1 11/05/2017   PLT 90 (L) 11/05/2017   Lab Results  Component Value Date   CREATININE 0.81 06/04/2018   BUN 11 06/04/2018   NA 142 06/04/2018   K 3.9 06/04/2018   CL 102 06/04/2018   CO2 25 06/04/2018   Lab Results  Component Value Date   ALT 10 06/04/2018   AST 14 06/04/2018   ALKPHOS 50 06/04/2018   BILITOT 0.8 06/04/2018   Lab Results  Component Value Date   CHOL 156 06/04/2018     HDL 57 06/04/2018  LDLCALC 75 06/04/2018   TRIG 120 06/04/2018   CHOLHDL 2.7 06/04/2018    Lab Results  Component Value Date   HGBA1C 5.4 10/31/2017   Lab Results  Component Value Date   TSH 0.455 03/13/2014     STUDIES/PROCEDURES reviewed today   Echo 11/03/2018 1. The left ventricle has mild-moderately reduced systolic function of  61-60%. The cavity size was mildly increased. There is mildly increased  left ventricular wall thickness. Left ventricular diastolic Doppler  parameters are consistent with impaired  relaxation.  2. The right ventricle has normal systolic function. The cavity was  mildly enlarged. There is not assessed. Right ventricular systolic  pressure could not be assessed.  3. Left atrial size was mildly dilated.  4. Right atrial size was mildly dilated.  5. The mitral valve is degenerative. There is mild thickening. There is  moderate mitral annular calcification present.  6. The tricuspid valve is not assessed.  7. A 48mm Edwards Sapien bioprosthetic aortic valve valve is present in  the aortic position. Procedure Date: 11/04/17. Mean gradient is 15 mmHg,  decreased from 21 mmHg on prior study.  8. The aortic root and ascending aorta are normal in size and structure.  9. The interatrial septum was not well visualized.   Assessment & Plan    Chronic systolic congestive heart failure NICM S/p St. Jude CRT-D -Reports chronic DOE with known COPD.  Most recent echo as above with EF 40-45%. He does seem to be slightly volume up on exam today, and he admits this may be 2/2 eating tomato soup last night. Clinic BP 130/80 and weight up from previous.  Home weight reported as stable and home SBP 120-125 with DBP 70-80s. He is currently taking his Lasix once monthly for extra weight gain with recommendation today that he take 1 tomorrow AM, given his current volume status. We will check a BMET today.  Continue current beta-blocker, ARB, and  spironolactone. Low salt diet and volume intake below 2L stressed, as well as BP control.   History of aortic stenosis s/p TAVR --S/p his annual follow-up and echo with phone note left by the TAVR team and indicating stable valve function by echo. He remains on Plavix. We will order a CBC today.  Essential hypertension --BP mildly elevated / borderline today in the setting of recent salt intake and slight volume overload on exam. He will take an additional Lasix 20 mg, as prescribed for extra fluid and given today's volume status.  Continue current beta-blocker, ARB, and spironolactone. If his BP remains elevated at RTC, consider increasing current Coreg. Will defer for now.   Mixed hyperlipidemia --Continue current statin.    COPD --Chronic DOE. SpO2 93% today, which patient reports as typical. He denies any current sx at rest.   Medication changes: None. Take an additional Lasix 20mg  for slight volume overload, as presecribed. Labs ordered: CBC, BMET Studies / Imaging ordered: None. Consider echo in future.  Disposition: RTC 6 mo  Arvil Chaco, PA-C 01/28/2020

## 2020-01-29 LAB — CBC WITH DIFFERENTIAL/PLATELET
Basophils Absolute: 0.1 10*3/uL (ref 0.0–0.2)
Basos: 1 %
EOS (ABSOLUTE): 0.3 10*3/uL (ref 0.0–0.4)
Eos: 4 %
Hematocrit: 43.2 % (ref 37.5–51.0)
Hemoglobin: 14.5 g/dL (ref 13.0–17.7)
Immature Grans (Abs): 0 10*3/uL (ref 0.0–0.1)
Immature Granulocytes: 0 %
Lymphocytes Absolute: 1.2 10*3/uL (ref 0.7–3.1)
Lymphs: 19 %
MCH: 31.9 pg (ref 26.6–33.0)
MCHC: 33.6 g/dL (ref 31.5–35.7)
MCV: 95 fL (ref 79–97)
Monocytes Absolute: 0.6 10*3/uL (ref 0.1–0.9)
Monocytes: 10 %
Neutrophils Absolute: 4 10*3/uL (ref 1.4–7.0)
Neutrophils: 66 %
Platelets: 145 10*3/uL — ABNORMAL LOW (ref 150–450)
RBC: 4.54 x10E6/uL (ref 4.14–5.80)
RDW: 12.1 % (ref 11.6–15.4)
WBC: 6.1 10*3/uL (ref 3.4–10.8)

## 2020-01-29 LAB — BASIC METABOLIC PANEL
BUN/Creatinine Ratio: 16 (ref 10–24)
BUN: 12 mg/dL (ref 8–27)
CO2: 27 mmol/L (ref 20–29)
Calcium: 9.5 mg/dL (ref 8.6–10.2)
Chloride: 101 mmol/L (ref 96–106)
Creatinine, Ser: 0.73 mg/dL — ABNORMAL LOW (ref 0.76–1.27)
GFR calc Af Amer: 101 mL/min/{1.73_m2} (ref >59)
GFR calc non Af Amer: 88 mL/min/{1.73_m2} (ref >59)
Glucose: 99 mg/dL (ref 65–99)
Potassium: 4.8 mmol/L (ref 3.5–5.2)
Sodium: 139 mmol/L (ref 134–144)

## 2020-01-31 ENCOUNTER — Encounter: Payer: Self-pay | Admitting: *Deleted

## 2020-02-14 ENCOUNTER — Telehealth: Payer: Self-pay

## 2020-02-14 NOTE — Telephone Encounter (Signed)
Returned patient call as requested by voice mail message.  He said he thought I had called him.  Advised the remote transmission is scheduled for next week and I will give him a call.  Advised no charge note on anyone calling him today.  He said he has a new phone and he could have been mistaken.  He did take a fluid pill today due to left ankle swelling. He doing well and no complaints.  No changes today and encouraged to call if needed.

## 2020-02-22 ENCOUNTER — Ambulatory Visit (INDEPENDENT_AMBULATORY_CARE_PROVIDER_SITE_OTHER): Payer: Medicare Other

## 2020-02-22 DIAGNOSIS — Z95810 Presence of automatic (implantable) cardiac defibrillator: Secondary | ICD-10-CM | POA: Diagnosis not present

## 2020-02-22 DIAGNOSIS — I5022 Chronic systolic (congestive) heart failure: Secondary | ICD-10-CM | POA: Diagnosis not present

## 2020-02-23 ENCOUNTER — Telehealth: Payer: Self-pay

## 2020-02-23 NOTE — Telephone Encounter (Signed)
Spoke with pt and reminded pt of remote transmission that is due today. Pt verbalized understanding.   

## 2020-02-23 NOTE — Telephone Encounter (Signed)
Received patient call.  He reported the device monitoring is not working and all the lights are flashing orange.  Provided [employer] support number and advised him to call for further assistance.

## 2020-02-23 NOTE — Progress Notes (Signed)
EPIC Encounter for ICM Monitoring  Patient Name: Keron Neenan is a 79 y.o. male Date: 02/23/2020 Primary Care Physican: Leanna Sato, MD Primary Cardiologist:Arida Electrophysiologist:Allred Bi-V Pacing:99% 5/7/2021Office Weight:207 lbs   Spoke with patient.  He is doing well and has no complaints today.  He had difficulty sending remote transmission and [employer] is sending a new one.  Transmission reviewed and advised to increase fluid intake today due to report suggest possible dryness.  CorvueThoracic impedancesuggesting possible dryness today.   Prescribed:Furosemide 20 mg take 1 tablet daily as needed for weight gain of 2 lbs or greater(takes on average once a month).Spironolactone 25 mg take 1 tablet daily.  Recommendations:Advised to increase fluid intake today to avoid risk of dehydration  Follow-up plan: ICM clinic phone appointment on7/02/2020. 91 day device clinic remote transmission6/22/2021.   Copy of ICM check sent to Dr.Allred.  3 month ICM trend: 02/23/2020    1 Year ICM trend:       Karie Soda, RN 02/23/2020 2:40 PM

## 2020-03-15 ENCOUNTER — Telehealth: Payer: Self-pay

## 2020-03-15 NOTE — Telephone Encounter (Signed)
Left message for patient to remind of missed remote transmission.  

## 2020-03-24 ENCOUNTER — Ambulatory Visit (INDEPENDENT_AMBULATORY_CARE_PROVIDER_SITE_OTHER): Payer: Medicare Other | Admitting: *Deleted

## 2020-03-24 DIAGNOSIS — I428 Other cardiomyopathies: Secondary | ICD-10-CM

## 2020-03-24 LAB — CUP PACEART REMOTE DEVICE CHECK
Battery Remaining Longevity: 46 mo
Battery Remaining Percentage: 55 %
Battery Voltage: 2.93 V
Brady Statistic AP VP Percent: 1 %
Brady Statistic AP VS Percent: 1 %
Brady Statistic AS VP Percent: 98 %
Brady Statistic AS VS Percent: 1 %
Brady Statistic RA Percent Paced: 1 %
Date Time Interrogation Session: 20210702022955
HighPow Impedance: 74 Ohm
HighPow Impedance: 74 Ohm
Implantable Lead Implant Date: 20180906
Implantable Lead Implant Date: 20180906
Implantable Lead Implant Date: 20180906
Implantable Lead Location: 753858
Implantable Lead Location: 753859
Implantable Lead Location: 753860
Implantable Pulse Generator Implant Date: 20180906
Lead Channel Impedance Value: 1175 Ohm
Lead Channel Impedance Value: 510 Ohm
Lead Channel Impedance Value: 560 Ohm
Lead Channel Pacing Threshold Amplitude: 0.5 V
Lead Channel Pacing Threshold Amplitude: 0.5 V
Lead Channel Pacing Threshold Amplitude: 2.5 V
Lead Channel Pacing Threshold Pulse Width: 0.5 ms
Lead Channel Pacing Threshold Pulse Width: 0.5 ms
Lead Channel Pacing Threshold Pulse Width: 1 ms
Lead Channel Sensing Intrinsic Amplitude: 12 mV
Lead Channel Sensing Intrinsic Amplitude: 3.6 mV
Lead Channel Setting Pacing Amplitude: 2 V
Lead Channel Setting Pacing Amplitude: 2.5 V
Lead Channel Setting Pacing Amplitude: 2.75 V
Lead Channel Setting Pacing Pulse Width: 0.5 ms
Lead Channel Setting Pacing Pulse Width: 1 ms
Lead Channel Setting Sensing Sensitivity: 0.5 mV
Pulse Gen Serial Number: 7421078

## 2020-03-28 ENCOUNTER — Ambulatory Visit (INDEPENDENT_AMBULATORY_CARE_PROVIDER_SITE_OTHER): Payer: Medicare Other

## 2020-03-28 DIAGNOSIS — I5022 Chronic systolic (congestive) heart failure: Secondary | ICD-10-CM | POA: Diagnosis not present

## 2020-03-28 DIAGNOSIS — Z95810 Presence of automatic (implantable) cardiac defibrillator: Secondary | ICD-10-CM

## 2020-03-28 NOTE — Progress Notes (Signed)
Remote ICD transmission.   

## 2020-03-28 NOTE — Progress Notes (Signed)
EPIC Encounter for ICM Monitoring  Patient Name: Da Authement is a 79 y.o. male Date: 03/28/2020 Primary Care Physican: Leanna Sato, MD Primary Cardiologist:Arida Electrophysiologist:Allred Bi-V Pacing:99% 5/7/2021Office Weight:207 lbs   Spoke with patient.  He is doing well and has no complaints today.  Received new monitor this past week.  CorvueThoracic impedance normal.   Prescribed:Furosemide 20 mg take 1 tablet daily as needed for weight gain of 2 lbs or greater(takes on average once a month).Spironolactone 25 mg take 1 tablet daily.  Recommendations: No changes and encouraged to call if experiencing any fluid symptoms.  Follow-up plan: ICM clinic phone appointment on8/05/2020. 91 day device clinic remote transmission10/09/2019.   Copy of ICM check sent to Dr.Allred.  3 month ICM trend: 03/28/2020    1 Year ICM trend:       Karie Soda, RN 03/28/2020 3:09 PM

## 2020-05-01 ENCOUNTER — Ambulatory Visit (INDEPENDENT_AMBULATORY_CARE_PROVIDER_SITE_OTHER): Payer: Medicare Other

## 2020-05-01 DIAGNOSIS — I5022 Chronic systolic (congestive) heart failure: Secondary | ICD-10-CM

## 2020-05-01 DIAGNOSIS — Z95810 Presence of automatic (implantable) cardiac defibrillator: Secondary | ICD-10-CM

## 2020-05-02 ENCOUNTER — Telehealth: Payer: Self-pay

## 2020-05-02 NOTE — Telephone Encounter (Signed)
The pt agreed to send missed ICM transmission. The pt monitor was not working. I gave him the number to tech support to get additional help.

## 2020-05-02 NOTE — Progress Notes (Signed)
EPIC Encounter for ICM Monitoring  Patient Name: Benjamin Frost is a 79 y.o. male Date: 05/02/2020 Primary Care Physican: Leanna Sato, MD Primary Cardiologist:Arida Electrophysiologist:Allred Bi-V Pacing:99% 8/10/2021Weight:196lbs   Spoke with patient. He is doing well and he has had some leg swelling.  He took Furosemide PRN during decreased impedance.   CorvueThoracic impedance normal.   Prescribed:  Furosemide 20 mg take 1 tablet daily as needed for weight gain of 2 lbs or greater(takes on average once a month).  Spironolactone 25 mg take 1 tablet daily.  Recommendations: No changes and encouraged to call if experiencing any fluid symptoms.  Follow-up plan: ICM clinic phone appointment on9/13/2021. 91 day device clinic remote transmission10/09/2019.   EP/Cardiology Office Visits: 08/01/2020 with [name].    Copy of ICM check sent to [name].   3 month ICM trend: 05/02/2020    1 Year ICM trend:       Karie Soda, RN 05/02/2020 5:07 PM

## 2020-06-05 ENCOUNTER — Ambulatory Visit (INDEPENDENT_AMBULATORY_CARE_PROVIDER_SITE_OTHER): Payer: Medicare Other

## 2020-06-05 DIAGNOSIS — I5022 Chronic systolic (congestive) heart failure: Secondary | ICD-10-CM

## 2020-06-05 DIAGNOSIS — Z95810 Presence of automatic (implantable) cardiac defibrillator: Secondary | ICD-10-CM

## 2020-06-06 ENCOUNTER — Telehealth: Payer: Self-pay

## 2020-06-06 NOTE — Progress Notes (Signed)
EPIC Encounter for ICM Monitoring  Patient Name: Benjamin Frost is a 79 y.o. male Date: 06/06/2020 Primary Care Physican: Leanna Sato, MD Primary Cardiologist:Arida Electrophysiologist:Allred Bi-V Pacing:99% 8/10/2021Weight:196lbs   Attempted call to patient and unable to reach.  Left detailed message per DPR regarding transmission. Transmission reviewed.    CorvueThoracic impedancenormal but suggesting possible fluid accumulation from 05/18/2020 - 05/28/2020.   Prescribed:  Furosemide 20 mg take 1 tablet daily as needed for weight gain of 2 lbs or greater(takes on average once a month).  Spironolactone 25 mg take 1 tablet daily.  Recommendations: Left voice mail with ICM number and encouraged to call if experiencing any fluid symptoms.  Follow-up plan: ICM clinic phone appointment on10/18/2021. 91 day device clinic remote transmission10/09/2019.   EP/Cardiology Office Visits: 08/01/2020 with [name].    Copy of ICM check sent to [name].   3 month ICM trend: 06/05/2020    1 Year ICM trend:       Karie Soda, RN 06/06/2020 4:36 PM

## 2020-06-06 NOTE — Telephone Encounter (Signed)
Remote ICM transmission received.  Attempted call to patient regarding ICM remote transmission and left detailed message per DPR.  Advised to return call for any fluid symptoms or questions. Next ICM remote transmission scheduled 07/10/2020.     

## 2020-07-03 ENCOUNTER — Ambulatory Visit (INDEPENDENT_AMBULATORY_CARE_PROVIDER_SITE_OTHER): Payer: Medicare Other

## 2020-07-03 DIAGNOSIS — I428 Other cardiomyopathies: Secondary | ICD-10-CM

## 2020-07-03 DIAGNOSIS — I5022 Chronic systolic (congestive) heart failure: Secondary | ICD-10-CM | POA: Diagnosis not present

## 2020-07-03 LAB — CUP PACEART REMOTE DEVICE CHECK
Battery Remaining Longevity: 38 mo
Battery Remaining Percentage: 51 %
Battery Voltage: 2.93 V
Brady Statistic AP VP Percent: 1 %
Brady Statistic AP VS Percent: 1 %
Brady Statistic AS VP Percent: 98 %
Brady Statistic AS VS Percent: 1 %
Brady Statistic RA Percent Paced: 1 %
Date Time Interrogation Session: 20211011040017
HighPow Impedance: 83 Ohm
HighPow Impedance: 83 Ohm
Implantable Lead Implant Date: 20180906
Implantable Lead Implant Date: 20180906
Implantable Lead Implant Date: 20180906
Implantable Lead Location: 753858
Implantable Lead Location: 753859
Implantable Lead Location: 753860
Implantable Pulse Generator Implant Date: 20180906
Lead Channel Impedance Value: 1200 Ohm
Lead Channel Impedance Value: 530 Ohm
Lead Channel Impedance Value: 540 Ohm
Lead Channel Pacing Threshold Amplitude: 0.5 V
Lead Channel Pacing Threshold Amplitude: 0.5 V
Lead Channel Pacing Threshold Amplitude: 2.5 V
Lead Channel Pacing Threshold Pulse Width: 0.5 ms
Lead Channel Pacing Threshold Pulse Width: 0.5 ms
Lead Channel Pacing Threshold Pulse Width: 1 ms
Lead Channel Sensing Intrinsic Amplitude: 11.7 mV
Lead Channel Sensing Intrinsic Amplitude: 3.5 mV
Lead Channel Setting Pacing Amplitude: 2 V
Lead Channel Setting Pacing Amplitude: 2.5 V
Lead Channel Setting Pacing Amplitude: 2.75 V
Lead Channel Setting Pacing Pulse Width: 0.5 ms
Lead Channel Setting Pacing Pulse Width: 1 ms
Lead Channel Setting Sensing Sensitivity: 0.5 mV
Pulse Gen Serial Number: 7421078

## 2020-07-05 NOTE — Progress Notes (Signed)
Remote ICD transmission.   

## 2020-07-10 ENCOUNTER — Ambulatory Visit (INDEPENDENT_AMBULATORY_CARE_PROVIDER_SITE_OTHER): Payer: Medicare Other

## 2020-07-10 DIAGNOSIS — I5022 Chronic systolic (congestive) heart failure: Secondary | ICD-10-CM | POA: Diagnosis not present

## 2020-07-10 DIAGNOSIS — Z95810 Presence of automatic (implantable) cardiac defibrillator: Secondary | ICD-10-CM | POA: Diagnosis not present

## 2020-07-11 NOTE — Progress Notes (Signed)
EPIC Encounter for ICM Monitoring  Patient Name: Benjamin Frost is a 79 y.o. male Date: 07/11/2020 Primary Care Physican: Leanna Sato, MD Primary Cardiologist:Arida Electrophysiologist:Allred Bi-V Pacing:99% (07/03/2020 report) 8/10/2021Weight:196lbs   Transmission reviewed.    CorvueThoracic impedancenormal.   Prescribed:  Furosemide 20 mg take 1 tablet daily as needed for weight gain of 2 lbs or greater(takes on average once a month).  Spironolactone 25 mg take 1 tablet daily.  Recommendations: No changes  Follow-up plan: ICM clinic phone appointment on11/22/2021. 91 day device clinic remote transmission1/06/2021.  EP/Cardiology Office Visits:08/01/2020 with Dr.Arida.   Copy of ICM check sent to Dr.Allred.    3 month ICM trend: 07/10/2020      Karie Soda, RN 07/11/2020 9:08 AM

## 2020-07-18 ENCOUNTER — Telehealth: Payer: Self-pay

## 2020-07-18 NOTE — Telephone Encounter (Signed)
Received call from patient.  He asked about a letter he received regarding remote transmission was not received.  Advised transmission has been received since the letter was sent and there is nothing he needs to do.  He has been Benjamin Frost of breath the last few days and took 2 doses of PRN Furosemide.  He sent a remote transmission during conversation for review.    Corvue thoracic impedance shows possible fluid accumulation from 10/23-10/25.  Fluid levels returned to normal after taking PRN Furosemide.      Advised to call back if he has any further fluid symptoms not relieved by PRN Furosemide.

## 2020-07-25 ENCOUNTER — Telehealth: Payer: Self-pay

## 2020-07-25 MED ORDER — FUROSEMIDE 20 MG PO TABS: 20.0000 mg | ORAL_TABLET | Freq: Every day | ORAL | 1 refills | Status: DC | PRN

## 2020-07-25 NOTE — Telephone Encounter (Signed)
Received call from patient. He request a Lasix refill.  He has an appointment with Dr Kirke Corin on 08/01/2020 but is currently out of medication.  He requested script be sent to [facility].

## 2020-08-01 ENCOUNTER — Encounter: Payer: Self-pay | Admitting: Cardiovascular Disease

## 2020-08-01 ENCOUNTER — Ambulatory Visit: Payer: Medicare Other | Admitting: Cardiovascular Disease

## 2020-08-01 ENCOUNTER — Other Ambulatory Visit: Payer: Self-pay

## 2020-08-01 VITALS — BP 130/72 | HR 66 | Ht 70.0 in | Wt 204.1 lb

## 2020-08-01 DIAGNOSIS — I5022 Chronic systolic (congestive) heart failure: Secondary | ICD-10-CM

## 2020-08-01 DIAGNOSIS — Z952 Presence of prosthetic heart valve: Secondary | ICD-10-CM | POA: Diagnosis not present

## 2020-08-01 DIAGNOSIS — E785 Hyperlipidemia, unspecified: Secondary | ICD-10-CM | POA: Diagnosis not present

## 2020-08-01 NOTE — Progress Notes (Signed)
Cardiology Office Note   Date:  08/01/2020   ID:  Benjamin Frost, DOB 06-25-1941, MRN [id number]  PCP:  Leanna Sato, MD  Cardiologist:   Lorine Bears, MD   Chief Complaint  Patient presents with   Other    6 month follow up. Meds reviewed by the pt. verbally. "doing well."      History of Present Illness: Benjamin Frost is a 79 y.o. male who presents for a followup visit regarding chronic systolic heart failure with severely reduced LV systolic function, aortic stenosis and an underlying left bundle branch block. Previous ejection fraction was 10-15% in 2015. Right and  left cardiac catheterization in July, 2015 showed only mildly elevated filling pressures with normal cardiac output and no significant pulmonary hypertension. Coronary angiography showed no obstructive coronary artery disease. He had significant sinus tachycardia that improved significantly with Ivabradine.  He is status post ICD CRT placement.  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   Most recent echocardiogram in February 2020 showed an EF of 40 to 45%, mild biatrial enlargement, normal functioning TAVR prosthesis with mean gradient of 15 mmHg.  He has some issues with volume overload that improved with small dose furosemide.  He does not have to take furosemide every day and on average he takes 1 or 2 doses a month.  He reports stable dyspnea and no chest pain.  Past Medical History:  Diagnosis Date   AICD (automatic cardioverter/defibrillator) present    a. St Jude, placed after VF arrest during a dobuatamine echo.    Aortic stenosis, severe    a. 11/04/17: s/p TAVR by [name] and [name]; b. 10/2018 Echo: 75mm Edwards Sapien bioprosthetic AoV. Mean grad (down from on prior study).   Chronic systolic heart failure (HCC)    a. 2015 EF 10-15%; b. 10/2018 Echo: EF 40-45%, nl RV fxn. Mildly dil LA.   COPD (chronic obstructive pulmonary disease) (HCC)    GERD  (gastroesophageal reflux disease)    History of BPH    History of kidney stones    Hyperlipidemia    Hypertension    Left bundle branch block    NICM (nonischemic cardiomyopathy) (HCC)    a. 2015 EF 10-15%; b. 03/2014 cath: nl cors; c. S/p SJM CRT-D; d. 10/2018 Echo: EF 40-45%.   S/P TAVR (transcatheter aortic valve replacement)    a. 10/2017: s/p TAVR with an Edwards Sapien 3 THV (size 26 mm, model # 9600TFX, serial # [id number])   Sinus tachycardia     Past Surgical History:  Procedure Laterality Date   BIV ICD INSERTION CRT-D N/A 05/29/2017   SJM Quadra Assura implanted by Dr Johney Frame for secondary prevention of sudden death   CARDIAC CATHETERIZATION  2012   [facility]   CARDIAC CATHETERIZATION     [facility]   CATARACT EXTRACTION, BILATERAL     LEFT AND RIGHT HEART CATHETERIZATION WITH CORONARY ANGIOGRAM N/A 03/23/2014   Procedure: LEFT AND RIGHT HEART CATHETERIZATION WITH CORONARY ANGIOGRAM;  Surgeon: [name], MD;  Location: [facility] CATH LAB;  Service: Cardiovascular;  Laterality: N/A;   LEG SURGERY Left    surgery d/t injury from nail gun   RIGHT/LEFT HEART CATH AND CORONARY ANGIOGRAPHY N/A 05/16/2017   Procedure: RIGHT/LEFT HEART CATH AND CORONARY ANGIOGRAPHY;  Surgeon: [name], MD;  Location: [facility] INVASIVE CV LAB;  Service: Cardiovascular;  Laterality: N/A;   TEE WITHOUT CARDIOVERSION N/A 11/04/2017   Procedure: TRANSESOPHAGEAL ECHOCARDIOGRAM (TEE);  Surgeon:  [name], MD;  Location: [facility] OR;  Service: Open Heart Surgery;  Laterality: N/A;   TRANSCATHETER AORTIC VALVE REPLACEMENT, TRANSFEMORAL N/A 11/04/2017   Procedure: TRANSCATHETER AORTIC VALVE REPLACEMENT, TRANSFEMORAL;  Surgeon: [name], MD;  Location: [facility] OR;  Service: Open Heart Surgery;  Laterality: N/A;     Current Outpatient Medications  Medication Sig Dispense Refill   albuterol (PROAIR HFA) 108 (90 BASE) MCG/ACT inhaler Inhale 2 puffs into the lungs every 6 (six) hours as needed for wheezing or  shortness of breath.     aspirin 81 MG tablet Take 81 mg by mouth daily.     atorvastatin (LIPITOR) 40 MG tablet Take 40 mg by mouth daily.     carvedilol (COREG) 6.25 MG tablet Take 1 tablet (6.25 mg total) by mouth 2 (two) times daily. 180 tablet 1   clopidogrel (PLAVIX) 75 MG tablet Take 1 tablet (75 mg total) by mouth daily with breakfast. 30 tablet 3   Fluticasone-Salmeterol (ADVAIR) 250-50 MCG/DOSE AEPB Inhale 1 puff into the lungs 2 (two) times daily.     furosemide (LASIX) 20 MG tablet Take 1 tablet (20 mg total) by mouth daily as needed. For weight gain greater than 2 lb. 30 tablet 1   losartan (COZAAR) 25 MG tablet Take 1 tablet (25 mg total) by mouth daily. 30 tablet 3   pantoprazole (PROTONIX) 40 MG tablet Take 1 tablet (40 mg total) by mouth daily. 30 tablet 0   spironolactone (ALDACTONE) 25 MG tablet Take 1 tablet (25 mg total) by mouth daily. 30 tablet 0   tamsulosin (FLOMAX) 0.4 MG CAPS capsule Take 0.4 mg by mouth daily.     tiotropium (SPIRIVA) 18 MCG inhalation capsule Place 18 mcg into inhaler and inhale daily.     amoxicillin (AMOXIL) 500 MG tablet Take 2,000 mg (four 500 mg tablets) one hour prior to dental appointments. (Patient not taking: Reported on 08/01/2020) 4 tablet 6   No current facility-administered medications for this visit.    Allergies:   Lisinopril, Dobutamine, Amlodipine, and Metoprolol    Social History:  The patient  reports that he quit smoking about 29 years ago. His smoking use included cigarettes. He quit after 0.00 years of use. He has quit using smokeless tobacco.  His smokeless tobacco use included snuff. He reports current alcohol use. He reports that he does not use drugs.   Family History:  The patient's family history includes Heart disease in his father; Heart failure in his father; Hyperlipidemia in his mother; Hypertension in his mother.    ROS:  Please see the history of present illness.   Otherwise, review of systems are  positive for none.   All other systems are reviewed and negative.    PHYSICAL EXAM: VS:  BP 130/72 (BP Location: Left Arm, Patient Position: Sitting, Cuff Size: Normal)    Pulse 66    Ht 5\' 10"  (1.778 m)    Wt 204 lb 2 oz (92.6 kg)    SpO2 95%    BMI 29.29 kg/m  , BMI Body mass index is 29.29 kg/m. GEN: Well nourished, well developed, in no acute distress  HEENT: normal  Neck: no JVD, carotid bruits, or masses Cardiac: RRR; no  rubs, or gallops,no edema . There is 1/6 systolic murmur in the aortic area . Respiratory: Clear to auscultation, normal work of breathing GI: soft, nontender, nondistended, + BS MS: no deformity or atrophy  Skin: warm and dry, no rash Neuro:  Strength and sensation are  intact Psych: euthymic mood, full affect   EKG:  EKG is ordered today. The ekg ordered today demonstrates atrial sensed ventricular paced rhythm .   Recent Labs: 01/28/2020: BUN 12; Creatinine, Ser 0.73; Hemoglobin 14.5; Platelets 145; Potassium 4.8; Sodium 139    Lipid Panel    Component Value Date/Time   CHOL 156 06/04/2018 1116   CHOL 117 02/28/2014 0442   TRIG 120 06/04/2018 1116   TRIG 65 02/28/2014 0442   HDL 57 06/04/2018 1116   HDL 44 02/28/2014 0442   CHOLHDL 2.7 06/04/2018 1116   VLDL 13 02/28/2014 0442   LDLCALC 75 06/04/2018 1116   LDLCALC 60 02/28/2014 0442      Wt Readings from Last 3 Encounters:  08/01/20 204 lb 2 oz (92.6 kg)  01/28/20 207 lb 4 oz (94 kg)  03/23/19 202 lb (91.6 kg)       ASSESSMENT AND PLAN:  1.  Chronic systolic heart failure: Due to nonischemic cardiomyopathy.  He is currently New York Heart Association class II.  Most recent ejection fraction was 40 to 45%. Continue treatment with carvedilol, losartan and spironolactone.  He appears to be euvolemic without a  Diuretic.  He uses furosemide only as needed.  If fluid overload becomes an issue, we can consider adding [place].  2.  Status post TAVR  for severe aortic stenosis: Stable.  Most  recent echo showed normal functioning prosthesis.  I discontinued clopidogrel today.  Continue aspirin indefinitely.  3. Hyperlipidemia: Currently on atorvastatin.    4.  Status post ICD CRT: Followed by the EP clinic.  EKG today shows normal functioning pacemaker.  5.  COPD: Seems to be stable.   Disposition:   FU with me in 6 months  Signed,  Lorine Bears, MD  08/01/2020 3:28 PM    [facility]

## 2020-08-01 NOTE — Patient Instructions (Signed)
Medication Instructions:  Your physician has recommended you make the following change in your medication:   STOP Plavix  *If you need a refill on your cardiac medications before your next appointment, please call your pharmacy*   Lab Work: None ordered If you have labs (blood work) drawn today and your tests are completely normal, you will receive your results only by: Marland Kitchen MyChart Message (if you have MyChart) OR . A paper copy in the mail If you have any lab test that is abnormal or we need to change your treatment, we will call you to review the results.   Testing/Procedures: None ordered   Follow-Up: At [facility], you and your health needs are our priority.  As part of our continuing mission to provide you with exceptional heart care, we have created designated Provider Care Teams.  These Care Teams include your primary Cardiologist (physician) and Advanced Practice Providers (APPs -  Physician Assistants and Nurse Practitioners) who all work together to provide you with the care you need, when you need it.  We recommend signing up for the patient portal called "MyChart".  Sign up information is provided on this After Visit Summary.  MyChart is used to connect with patients for Virtual Visits (Telemedicine).  Patients are able to view lab/test results, encounter notes, upcoming appointments, etc.  Non-urgent messages can be sent to your provider as well.   To learn more about what you can do with MyChart, go to [email or web address].    Your next appointment:   6 month(s)  The format for your next appointment:   In Person  Provider:   You may see Lorine Bears, MD or one of the following Advanced Practice Providers on your designated Care Team:    Nicolasa Ducking, NP  Eula Listen, PA-C  Marisue Ivan, PA-C  Cadence Fransico Michael, [place]    Other Instructions N/A

## 2020-08-14 ENCOUNTER — Ambulatory Visit (INDEPENDENT_AMBULATORY_CARE_PROVIDER_SITE_OTHER): Payer: Medicare Other

## 2020-08-14 DIAGNOSIS — I5022 Chronic systolic (congestive) heart failure: Secondary | ICD-10-CM

## 2020-08-14 DIAGNOSIS — Z95810 Presence of automatic (implantable) cardiac defibrillator: Secondary | ICD-10-CM

## 2020-08-15 NOTE — Progress Notes (Signed)
EPIC Encounter for ICM Monitoring  Patient Name: Benjamin Frost is a 79 y.o. male Date: 08/15/2020 Primary Care Physican: Leanna Sato, MD Primary Cardiologist:Arida Electrophysiologist:Allred Bi-V Pacing:99%  11/9/2021Office Weight:204 lbs   Spoke with patient and reports feeling well at this time.  Denies fluid symptoms.  He took a Furosemide last week.  CorvueThoracic impedancenormal.   Prescribed:  Furosemide 20 mg take 1 tablet daily as needed for weight gain of 2 lbs or greater(takes on average once a month).  Spironolactone 25 mg take 1 tablet daily.  Recommendations: No changes and encouraged to call if experiencing any fluid symptoms.  Follow-up plan: ICM clinic phone appointment on12/27/2021. 91 day device clinic remote transmission1/06/2021.  EP/Cardiology Office Visits: Recall for 01/28/2021 with Dr.Arida.   Copy of ICM check sent to Dr.Allred.   3 month ICM trend: 08/14/2020    1 Year ICM trend:       Karie Soda, RN 08/15/2020 4:59 PM

## 2020-09-18 ENCOUNTER — Ambulatory Visit (INDEPENDENT_AMBULATORY_CARE_PROVIDER_SITE_OTHER): Payer: Medicare Other

## 2020-09-18 DIAGNOSIS — I5022 Chronic systolic (congestive) heart failure: Secondary | ICD-10-CM | POA: Diagnosis not present

## 2020-09-18 DIAGNOSIS — Z95810 Presence of automatic (implantable) cardiac defibrillator: Secondary | ICD-10-CM | POA: Diagnosis not present

## 2020-09-19 NOTE — Progress Notes (Signed)
EPIC Encounter for ICM Monitoring  Patient Name: Benjamin Frost is a 79 y.o. male Date: 09/19/2020 Primary Care Physican: Leanna Sato, MD Primary Cardiologist:Arida Electrophysiologist:Allred Bi-V Pacing:99% 11/9/2021Office Weight:204 lbs   Spoke with patient and reports feeling like he has fluid and already taken 1 PRN Furosemide tablet.  CorvueThoracic impedancesuggesting possible fluid accumulation since 09/09/2020.   Prescribed:  Furosemide 20 mg take 1 tablet daily as needed for weight gain of 2 lbs or greater(takes on average once a month).  Spironolactone 25 mg take 1 tablet daily.  Recommendations: Advised to take PRN Furosemide x 1-2 days and he already took one on 09/18/2020.  Follow-up plan: ICM clinic phone appointment on1/01/2021 to recheck fluid levels. 91 day device clinic remote transmission1/06/2021.  EP/Cardiology Office Visits: Recall for 01/28/2021 with Dr.Arida.   Copy of ICM check sent to Dr.Allred and Dr Kirke Corin as Lorain Childes.    3 month ICM trend: 09/18/2020    1 Year ICM trend:       Karie Soda, RN 09/19/2020 12:48 PM

## 2020-09-27 ENCOUNTER — Ambulatory Visit (INDEPENDENT_AMBULATORY_CARE_PROVIDER_SITE_OTHER): Payer: Medicare Other

## 2020-09-27 DIAGNOSIS — I5022 Chronic systolic (congestive) heart failure: Secondary | ICD-10-CM

## 2020-09-27 DIAGNOSIS — Z95810 Presence of automatic (implantable) cardiac defibrillator: Secondary | ICD-10-CM

## 2020-09-29 NOTE — Progress Notes (Signed)
EPIC Encounter for ICM Monitoring  Patient Name: Luisantonio Adinolfi is a 80 y.o. male Date: 09/29/2020 Primary Care Physican: Leanna Sato, MD Primary Cardiologist:Arida Electrophysiologist:Allred Bi-V Pacing:99% 09/29/2020 Weight:196lbs   Spoke with patient and reports feeling well at this time.  Denies fluid symptoms.    CorvueThoracic impedancesuggesting fluid levels returned to normal after taking PRN Furosemide.   Prescribed:  Furosemide 20 mg take 1 tablet daily as needed for weight gain of 2 lbs or greater(takes on average once a month).  Spironolactone 25 mg take 1 tablet daily.  Recommendations: No changes and encouraged to call if experiencing any fluid symptoms.  Follow-up plan: ICM clinic phone appointment on2/04/2021. 91 day device clinic remote transmission1/06/2021.  EP/Cardiology Office Visits: Recall for 5/8/2022with Dr.Arida.   Copy of ICM check sent to Dr.Allred    3 month ICM trend: 09/27/2020.    1 Year ICM trend:       Karie Soda, RN 09/29/2020 5:03 PM

## 2020-10-02 ENCOUNTER — Ambulatory Visit (INDEPENDENT_AMBULATORY_CARE_PROVIDER_SITE_OTHER): Payer: Medicare Other

## 2020-10-02 DIAGNOSIS — I428 Other cardiomyopathies: Secondary | ICD-10-CM

## 2020-10-03 LAB — CUP PACEART REMOTE DEVICE CHECK
Battery Remaining Longevity: 35 mo
Battery Remaining Percentage: 48 %
Battery Voltage: 2.93 V
Brady Statistic AP VP Percent: 1 %
Brady Statistic AP VS Percent: 1 %
Brady Statistic AS VP Percent: 98 %
Brady Statistic AS VS Percent: 1 %
Brady Statistic RA Percent Paced: 1 %
Date Time Interrogation Session: 20220110183436
HighPow Impedance: 89 Ohm
HighPow Impedance: 89 Ohm
Implantable Lead Implant Date: 20180906
Implantable Lead Implant Date: 20180906
Implantable Lead Implant Date: 20180906
Implantable Lead Location: 753858
Implantable Lead Location: 753859
Implantable Lead Location: 753860
Implantable Pulse Generator Implant Date: 20180906
Lead Channel Impedance Value: 1200 Ohm
Lead Channel Impedance Value: 510 Ohm
Lead Channel Impedance Value: 530 Ohm
Lead Channel Pacing Threshold Amplitude: 0.5 V
Lead Channel Pacing Threshold Amplitude: 0.5 V
Lead Channel Pacing Threshold Amplitude: 1.875 V
Lead Channel Pacing Threshold Pulse Width: 0.5 ms
Lead Channel Pacing Threshold Pulse Width: 0.5 ms
Lead Channel Pacing Threshold Pulse Width: 1 ms
Lead Channel Sensing Intrinsic Amplitude: 11.7 mV
Lead Channel Sensing Intrinsic Amplitude: 3.2 mV
Lead Channel Setting Pacing Amplitude: 2 V
Lead Channel Setting Pacing Amplitude: 2.125
Lead Channel Setting Pacing Amplitude: 2.5 V
Lead Channel Setting Pacing Pulse Width: 0.5 ms
Lead Channel Setting Pacing Pulse Width: 1 ms
Lead Channel Setting Sensing Sensitivity: 0.5 mV
Pulse Gen Serial Number: 7421078

## 2020-10-16 NOTE — Progress Notes (Signed)
Remote ICD transmission.   

## 2020-10-31 ENCOUNTER — Ambulatory Visit (INDEPENDENT_AMBULATORY_CARE_PROVIDER_SITE_OTHER): Payer: Medicare Other

## 2020-10-31 DIAGNOSIS — Z95810 Presence of automatic (implantable) cardiac defibrillator: Secondary | ICD-10-CM

## 2020-10-31 DIAGNOSIS — I5022 Chronic systolic (congestive) heart failure: Secondary | ICD-10-CM | POA: Diagnosis not present

## 2020-11-01 NOTE — Progress Notes (Signed)
EPIC Encounter for ICM Monitoring  Patient Name: Charlene Cowdrey is a 80 y.o. male Date: 11/01/2020 Primary Care Physican: Leanna Sato, MD Primary Cardiologist:Arida Electrophysiologist:Allred Bi-V Pacing:99% 09/29/2020 Weight:196lbs   Spoke with patient and reports feeling well at this time.  Denies fluid symptoms.    CorvueThoracic impedancesuggesting possible fluid accumulation starting 10/29/2020.   Prescribed:  Furosemide 20 mg take 1 tablet daily as needed for weight gain of 2 lbs or greater(takes on average once a month).  Spironolactone 25 mg take 1 tablet daily.  Recommendations: Advised to take PRN Furosemide 1 tablet x 1 day.    Follow-up plan: ICM clinic phone appointment on2/17/2022 to recheck fluid levels. 91 day device clinic remote transmission4/07/2021.  EP/Cardiology Office Visits: Recall for 5/8/2022with Dr.Arida.   Copy of ICM check sent to Dr.Allred.   3 month ICM trend: 10/31/2020.    1 Year ICM trend:       Karie Soda, RN 11/01/2020 9:09 AM

## 2020-11-10 ENCOUNTER — Ambulatory Visit (INDEPENDENT_AMBULATORY_CARE_PROVIDER_SITE_OTHER): Payer: Medicare Other

## 2020-11-10 ENCOUNTER — Telehealth: Payer: Self-pay

## 2020-11-10 DIAGNOSIS — Z95810 Presence of automatic (implantable) cardiac defibrillator: Secondary | ICD-10-CM

## 2020-11-10 DIAGNOSIS — I5022 Chronic systolic (congestive) heart failure: Secondary | ICD-10-CM

## 2020-11-10 NOTE — Progress Notes (Signed)
EPIC Encounter for ICM Monitoring  Patient Name: Benjamin Frost is a 80 y.o. male Date: 11/10/2020 Primary Care Physican: Leanna Sato, MD Primary Cardiologist:Arida Electrophysiologist:Allred Bi-V Pacing:99% 1/7/2022Weight:196lbs   Attempted call to patient and unable to reach.  Left detailed message per DPR regarding transmission. Transmission reviewed.    CorvueThoracic impedancesuggesting fluid levels returned to normal after taking PRN Furosemide.   Prescribed:  Furosemide 20 mg take 1 tablet daily as needed for weight gain of 2 lbs or greater(takes on average once a month).  Spironolactone 25 mg take 1 tablet daily.  Recommendations: Left voice mail with ICM number and encouraged to call if experiencing any fluid symptoms.    Follow-up plan: ICM clinic phone appointment on3/14/2022. 91 day device clinic remote transmission4/07/2021.  EP/Cardiology Office Visits: Recall for 5/8/2022with Dr.Arida.   Copy of ICM check sent to Dr.Allred.   3 month ICM trend: 11/09/2020.    1 Year ICM trend:       Karie Soda, RN 11/10/2020 2:48 PM

## 2020-11-10 NOTE — Telephone Encounter (Signed)
Remote ICM transmission received.  Attempted call to patient regarding ICM remote transmission and left detailed message per DPR.  Advised to return call for any fluid symptoms or questions. Next ICM remote transmission scheduled 12/04/2020.     

## 2020-12-04 ENCOUNTER — Ambulatory Visit (INDEPENDENT_AMBULATORY_CARE_PROVIDER_SITE_OTHER): Payer: Medicare Other

## 2020-12-04 DIAGNOSIS — I5022 Chronic systolic (congestive) heart failure: Secondary | ICD-10-CM

## 2020-12-04 DIAGNOSIS — Z95810 Presence of automatic (implantable) cardiac defibrillator: Secondary | ICD-10-CM

## 2020-12-05 NOTE — Progress Notes (Signed)
EPIC Encounter for ICM Monitoring  Patient Name: Benjamin Frost is a 80 y.o. male Date: 12/05/2020 Primary Care Physican: Leanna Sato, MD Primary Cardiologist:Arida Electrophysiologist:Allred Bi-V Pacing:99% 3/15/2022Weight:194lbs   Spoke with patient and reports feeling well at this time.  Denies fluid symptoms.  He had a stomach bug during impedance trended above baseline.  CorvueThoracic impedancesuggesting normal fluid levels.   Prescribed:  Furosemide 20 mg take 1 tablet daily as needed for weight gain of 2 lbs or greater(takes on average once a month).  Spironolactone 25 mg take 1 tablet daily.  Recommendations: No changes and encouraged to call if experiencing any fluid symptoms.  Follow-up plan: ICM clinic phone appointment on 01/08/2021. 91 day device clinic remote transmission4/07/2021.  EP/Cardiology Office Visits: Recall for 5/8/2022with Dr.Arida.   Copy of ICM check sent to Dr.Allred.   3 month ICM trend: 12/04/2020.    1 Year ICM trend:       Karie Soda, RN 12/05/2020 9:25 AM

## 2021-01-01 ENCOUNTER — Ambulatory Visit (INDEPENDENT_AMBULATORY_CARE_PROVIDER_SITE_OTHER): Payer: Medicare Other

## 2021-01-01 DIAGNOSIS — I428 Other cardiomyopathies: Secondary | ICD-10-CM | POA: Diagnosis not present

## 2021-01-02 LAB — CUP PACEART REMOTE DEVICE CHECK
Battery Remaining Longevity: 36 mo
Battery Remaining Percentage: 45 %
Battery Voltage: 2.92 V
Brady Statistic AP VP Percent: 1 %
Brady Statistic AP VS Percent: 1 %
Brady Statistic AS VP Percent: 98 %
Brady Statistic AS VS Percent: 1 %
Brady Statistic RA Percent Paced: 1 %
Date Time Interrogation Session: 20220411040014
HighPow Impedance: 84 Ohm
HighPow Impedance: 84 Ohm
Implantable Lead Implant Date: 20180906
Implantable Lead Implant Date: 20180906
Implantable Lead Implant Date: 20180906
Implantable Lead Location: 753858
Implantable Lead Location: 753859
Implantable Lead Location: 753860
Implantable Pulse Generator Implant Date: 20180906
Lead Channel Impedance Value: 1175 Ohm
Lead Channel Impedance Value: 530 Ohm
Lead Channel Impedance Value: 530 Ohm
Lead Channel Pacing Threshold Amplitude: 0.5 V
Lead Channel Pacing Threshold Amplitude: 0.5 V
Lead Channel Pacing Threshold Amplitude: 2.375 V
Lead Channel Pacing Threshold Pulse Width: 0.5 ms
Lead Channel Pacing Threshold Pulse Width: 0.5 ms
Lead Channel Pacing Threshold Pulse Width: 1 ms
Lead Channel Sensing Intrinsic Amplitude: 11.7 mV
Lead Channel Sensing Intrinsic Amplitude: 3.4 mV
Lead Channel Setting Pacing Amplitude: 2 V
Lead Channel Setting Pacing Amplitude: 2.5 V
Lead Channel Setting Pacing Amplitude: 2.625
Lead Channel Setting Pacing Pulse Width: 0.5 ms
Lead Channel Setting Pacing Pulse Width: 1 ms
Lead Channel Setting Sensing Sensitivity: 0.5 mV
Pulse Gen Serial Number: 7421078

## 2021-01-08 ENCOUNTER — Ambulatory Visit (INDEPENDENT_AMBULATORY_CARE_PROVIDER_SITE_OTHER): Payer: Medicare Other

## 2021-01-08 DIAGNOSIS — Z95810 Presence of automatic (implantable) cardiac defibrillator: Secondary | ICD-10-CM | POA: Diagnosis not present

## 2021-01-08 DIAGNOSIS — I5022 Chronic systolic (congestive) heart failure: Secondary | ICD-10-CM | POA: Diagnosis not present

## 2021-01-09 NOTE — Progress Notes (Signed)
EPIC Encounter for ICM Monitoring  Patient Name: Benjamin Frost is a 80 y.o. male Date: 01/09/2021 Primary Care Physican: Leanna Sato, MD Primary Cardiologist:Arida Electrophysiologist:Allred Bi-V Pacing:99% 3/15/2022Weight:194lbs   Spoke with patient and reports sinus infection.  He is taking Amoxicillin and Prednisone.   CorvueThoracic impedancesuggestingnormal fluid levels. Impedance suggesting dryness from 4/11-4/17.  Prescribed:  Furosemide 20 mg take 1 tablet daily as needed for weight gain of 2 lbs or greater(takes on average once a month).  Spironolactone 25 mg take 1 tablet daily.  Recommendations:No changes and encouraged to call if experiencing any fluid symptoms.  Follow-up plan: ICM clinic phone appointment on 02/13/2021. 91 day device clinic remote transmission7/07/2021.  EP/Cardiology Office Visits:  5/12/2022with Dr.Arida.   Copy of ICM check sent to Dr.Allred.  3 month ICM trend: 01/08/2021.    1 Year ICM trend:       Karie Soda, RN 01/09/2021 11:16 AM

## 2021-01-15 NOTE — Progress Notes (Signed)
Remote ICD transmission.   

## 2021-02-01 ENCOUNTER — Ambulatory Visit: Payer: Medicare Other | Admitting: Cardiovascular Disease

## 2021-02-01 ENCOUNTER — Encounter: Payer: Self-pay | Admitting: Cardiovascular Disease

## 2021-02-01 ENCOUNTER — Other Ambulatory Visit: Payer: Self-pay

## 2021-02-01 VITALS — BP 124/84 | HR 80 | Ht 70.0 in | Wt 209.4 lb

## 2021-02-01 DIAGNOSIS — Z952 Presence of prosthetic heart valve: Secondary | ICD-10-CM | POA: Diagnosis not present

## 2021-02-01 DIAGNOSIS — I10 Essential (primary) hypertension: Secondary | ICD-10-CM | POA: Diagnosis not present

## 2021-02-01 DIAGNOSIS — I5022 Chronic systolic (congestive) heart failure: Secondary | ICD-10-CM

## 2021-02-01 DIAGNOSIS — Z95810 Presence of automatic (implantable) cardiac defibrillator: Secondary | ICD-10-CM | POA: Diagnosis not present

## 2021-02-01 DIAGNOSIS — J449 Chronic obstructive pulmonary disease, unspecified: Secondary | ICD-10-CM

## 2021-02-01 NOTE — Progress Notes (Signed)
Cardiology Office Note   Date:  02/01/2021   ID:  Benjamin Frost, DOB July 07, 1941, MRN [id number]  PCP:  Leanna Sato, MD  Cardiologist:   Lorine Bears, MD   Chief Complaint  Patient presents with  . Other    6 month f/u no complaints today. Meds reviewed verbally with pt.      History of Present Illness: Benjamin Frost is a 80 y.o. male who presents for a followup visit regarding chronic systolic heart failure with severely reduced LV systolic function, aortic stenosis and an underlying left bundle branch block. Previous ejection fraction was 10-15% in 2015. Right and  left cardiac catheterization in July, 2015 showed only mildly elevated filling pressures with normal cardiac output and no significant pulmonary hypertension. Coronary angiography showed no obstructive coronary artery disease. He had significant sinus tachycardia that improved significantly with Ivabradine.  He is status post ICD CRT placement.  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   Most recent echocardiogram in February 2020 showed an EF of 40 to 45%, mild biatrial enlargement, normal functioning TAVR prosthesis with mean gradient of 15 mmHg.  He has been doing very well with no chest pain, shortness of breath or palpitations.  He has mild bilateral leg edema.  He takes furosemide as needed and on average he takes 1 dose in a month.  Past Medical History:  Diagnosis Date  . AICD (automatic cardioverter/defibrillator) present    a. St Jude, placed after VF arrest during a dobuatamine echo.   . Aortic stenosis, severe    a. 11/04/17: s/p TAVR by [name] and [name]; b. 10/2018 Echo: 90mm Edwards Sapien bioprosthetic AoV. Mean grad (down from on prior study).  . Chronic systolic heart failure (HCC)    a. 2015 EF 10-15%; b. 10/2018 Echo: EF 40-45%, nl RV fxn. Mildly dil LA.  Marland Kitchen COPD (chronic obstructive pulmonary disease) (HCC)   . GERD (gastroesophageal reflux disease)   .  History of BPH   . History of kidney stones   . Hyperlipidemia   . Hypertension   . Left bundle branch block   . NICM (nonischemic cardiomyopathy) (HCC)    a. 2015 EF 10-15%; b. 03/2014 cath: nl cors; c. S/p SJM CRT-D; d. 10/2018 Echo: EF 40-45%.  . S/P TAVR (transcatheter aortic valve replacement)    a. 10/2017: s/p TAVR with an Edwards Sapien 3 THV (size 26 mm, model # 9600TFX, serial # [id number])  . Sinus tachycardia     Past Surgical History:  Procedure Laterality Date  . 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 CARDIAC CATHETERIZATION  2012   [facility]  . CARDIAC CATHETERIZATION     [facility]  . CATARACT EXTRACTION, BILATERAL    . LEFT AND RIGHT HEART CATHETERIZATION WITH CORONARY ANGIOGRAM N/A 03/23/2014   Procedure: LEFT AND RIGHT HEART CATHETERIZATION WITH CORONARY ANGIOGRAM;  Surgeon: [name], MD;  Location: [facility] CATH LAB;  Service: Cardiovascular;  Laterality: N/A;  . LEG SURGERY Left    surgery d/t injury from nail gun  . RIGHT/LEFT HEART CATH AND CORONARY ANGIOGRAPHY N/A 05/16/2017   Procedure: RIGHT/LEFT HEART CATH AND CORONARY ANGIOGRAPHY;  Surgeon: [name], MD;  Location: [facility] INVASIVE CV LAB;  Service: Cardiovascular;  Laterality: N/A;  . TEE WITHOUT CARDIOVERSION N/A 11/04/2017   Procedure: TRANSESOPHAGEAL ECHOCARDIOGRAM (TEE);  Surgeon: [name], MD;  Location: [facility]  OR;  Service: Open Heart Surgery;  Laterality: N/A;  . TRANSCATHETER AORTIC VALVE REPLACEMENT, TRANSFEMORAL N/A 11/04/2017   Procedure: TRANSCATHETER AORTIC VALVE REPLACEMENT, TRANSFEMORAL;  Surgeon: [name], MD;  Location: [facility] OR;  Service: Open Heart Surgery;  Laterality: N/A;     Current Outpatient Medications  Medication Sig Dispense Refill  . albuterol (VENTOLIN HFA) 108 (90 Base) MCG/ACT inhaler Inhale 2 puffs into the lungs every 6 (six) hours as needed for wheezing or shortness of breath.    Marland Kitchen amoxicillin  (AMOXIL) 500 MG tablet Take 2,000 mg (four 500 mg tablets) one hour prior to dental appointments. 4 tablet 6  . aspirin 81 MG tablet Take 81 mg by mouth daily.    Marland Kitchen atorvastatin (LIPITOR) 40 MG tablet Take 40 mg by mouth daily.    . carvedilol (COREG) 6.25 MG tablet Take 1 tablet (6.25 mg total) by mouth 2 (two) times daily. 180 tablet 1  . Fluticasone-Salmeterol (ADVAIR) 250-50 MCG/DOSE AEPB Inhale 1 puff into the lungs 2 (two) times daily.    . furosemide (LASIX) 20 MG tablet Take 1 tablet (20 mg total) by mouth daily as needed. For weight gain greater than 2 lb. 30 tablet 1  . losartan (COZAAR) 25 MG tablet Take 1 tablet (25 mg total) by mouth daily. 30 tablet 3  . pantoprazole (PROTONIX) 40 MG tablet Take 1 tablet (40 mg total) by mouth daily. 30 tablet 0  . spironolactone (ALDACTONE) 25 MG tablet Take 1 tablet (25 mg total) by mouth daily. 30 tablet 0  . tamsulosin (FLOMAX) 0.4 MG CAPS capsule Take 0.4 mg by mouth daily.    Marland Kitchen tiotropium (SPIRIVA) 18 MCG inhalation capsule Place 18 mcg into inhaler and inhale daily.     No current facility-administered medications for this visit.    Allergies:   Lisinopril, Dobutamine, Amlodipine, and Metoprolol    Social History:  The patient  reports that he quit smoking about 30 years ago. His smoking use included cigarettes. He quit after 0.00 years of use. He has quit using smokeless tobacco.  His smokeless tobacco use included snuff. He reports current alcohol use. He reports that he does not use drugs.   Family History:  The patient's family history includes Heart disease in his father; Heart failure in his father; Hyperlipidemia in his mother; Hypertension in his mother.    ROS:  Please see the history of present illness.   Otherwise, review of systems are positive for none.   All other systems are reviewed and negative.    PHYSICAL EXAM: VS:  BP 124/84 (BP Location: Left Arm, Patient Position: Sitting, Cuff Size: Normal)   Pulse 80   Ht 5'  10" (1.778 m)   Wt 209 lb 6 oz (95 kg)   SpO2 94%   BMI 30.04 kg/m  , BMI Body mass index is 30.04 kg/m. GEN: Well nourished, well developed, in no acute distress  HEENT: normal  Neck: no JVD, carotid bruits, or masses Cardiac: RRR; no  rubs, or gallops,no edema . There is 1/6 systolic murmur in the aortic area . Respiratory: Clear to auscultation, normal work of breathing GI: soft, nontender, nondistended, + BS MS: no deformity or atrophy  Skin: warm and dry, no rash Neuro:  Strength and sensation are intact Psych: euthymic mood, full affect   EKG:  EKG is ordered today. The ekg ordered today demonstrates atrial sensed ventricular paced rhythm .   Recent Labs: No results found for requested labs within last 8760  hours.    Lipid Panel    Component Value Date/Time   CHOL 156 06/04/2018 1116   CHOL 117 02/28/2014 0442   TRIG 120 06/04/2018 1116   TRIG 65 02/28/2014 0442   HDL 57 06/04/2018 1116   HDL 44 02/28/2014 0442   CHOLHDL 2.7 06/04/2018 1116   VLDL 13 02/28/2014 0442   LDLCALC 75 06/04/2018 1116   LDLCALC 60 02/28/2014 0442      Wt Readings from Last 3 Encounters:  02/01/21 209 lb 6 oz (95 kg)  08/01/20 204 lb 2 oz (92.6 kg)  01/28/20 207 lb 4 oz (94 kg)       ASSESSMENT AND PLAN:  1.  Chronic systolic heart failure: Due to nonischemic cardiomyopathy.  He is currently New York Heart Association class II.  Most recent ejection fraction was 40 to 45%. Continue treatment with carvedilol, losartan and spironolactone.  He appears to be euvolemic without a  Diuretic.  He uses furosemide only as needed.  I reviewed most recent labs done with his primary care physician in March which showed normal function and electrolytes.  2.  Status post TAVR  for severe aortic stenosis: Stable.  Most recent echo showed normal functioning prosthesis.  Continue aspirin.  3. Hyperlipidemia: Currently on atorvastatin.  I reviewed most recent lipid profile done in March which  showed an LDL of 81.  4.  Status post ICD CRT: Followed by the EP clinic.  EKG today shows normal functioning pacemaker.  5.  COPD: Seems to be stable.   Disposition:   FU with me in 6 months  Signed,  Lorine Bears, MD  02/01/2021 9:04 AM     [facility]

## 2021-02-01 NOTE — Patient Instructions (Signed)
Medication Instructions:  Your physician recommends that you continue on your current medications as directed. Please refer to the Current Medication list given to you today.  *If you need a refill on your cardiac medications before your next appointment, please call your pharmacy*   Lab Work: None orered If you have labs (blood work) drawn today and your tests are completely normal, you will receive your results only by: Marland Kitchen MyChart Message (if you have MyChart) OR . A paper copy in the mail If you have any lab test that is abnormal or we need to change your treatment, we will call you to review the results.   Testing/Procedures: None ordered   Follow-Up: At [facility], you and your health needs are our priority.  As part of our continuing mission to provide you with exceptional heart care, we have created designated Provider Care Teams.  These Care Teams include your primary Cardiologist (physician) and Advanced Practice Providers (APPs -  Physician Assistants and Nurse Practitioners) who all work together to provide you with the care you need, when you need it.  We recommend signing up for the patient portal called "MyChart".  Sign up information is provided on this After Visit Summary.  MyChart is used to connect with patients for Virtual Visits (Telemedicine).  Patients are able to view lab/test results, encounter notes, upcoming appointments, etc.  Non-urgent messages can be sent to your provider as well.   To learn more about what you can do with MyChart, go to [email or web address].    Your next appointment:   Your physician wants you to follow-up in: 6 months You will receive a reminder letter in the mail two months in advance. If you don't receive a letter, please call our office to schedule the follow-up appointment.   The format for your next appointment:   In Person  Provider:   You may see Lorine Bears, MD or one of the following Advanced Practice Providers on your  designated Care Team:    Nicolasa Ducking, NP  Eula Listen, PA-C  Marisue Ivan, PA-C  Cadence Francis, [place]  Gillian Shields, NP    Other Instructions N/A

## 2021-02-12 ENCOUNTER — Ambulatory Visit (INDEPENDENT_AMBULATORY_CARE_PROVIDER_SITE_OTHER): Payer: Medicare Other

## 2021-02-12 ENCOUNTER — Telehealth: Payer: Self-pay

## 2021-02-12 DIAGNOSIS — I5022 Chronic systolic (congestive) heart failure: Secondary | ICD-10-CM | POA: Diagnosis not present

## 2021-02-12 DIAGNOSIS — Z95810 Presence of automatic (implantable) cardiac defibrillator: Secondary | ICD-10-CM

## 2021-02-12 NOTE — Telephone Encounter (Signed)
Remote ICM transmission received.  Attempted call to patient regarding ICM remote transmission and mail box is full. 

## 2021-02-12 NOTE — Telephone Encounter (Signed)
Attempted call to home number.  No answer or voice mail option.

## 2021-02-12 NOTE — Progress Notes (Signed)
EPIC Encounter for ICM Monitoring  Patient Name: Benjamin Frost is a 80 y.o. male Date: 02/12/2021 Primary Care Physican: Leanna Sato, MD Primary Cardiologist:Arida Electrophysiologist:Allred Bi-V Pacing:99% 3/15/2022Weight:194lbs   Attempted call to patient on home and cell number and unable to reach. Transmission reviewed.   CorvueThoracic impedancesuggesting possible fluid accumulation starting 02/06/2021.  Also decreased impedance from 4/17-4/30 and 5/12-5/16.  Prescribed:  Furosemide 20 mg take 1 tablet daily as needed for weight gain of 2 lbs or greater(takes on average once a month).  Spironolactone 25 mg take 1 tablet daily.  Recommendations: Unable to reach.  If patient returns call, will advise pt to take PRN furosemide.  Follow-up plan: ICM clinic phone appointment on5/31/2022 to recheck fluid levels. 91 day device clinic remote transmission7/07/2021.  EP/Cardiology Office Visits:  Recall 07/31/2021 with Ward Givens, PA.   Copy of ICM check sent to Dr.Allred and Dr Kirke Corin as Lorain Childes.  3 month ICM trend: 02/12/2021.    1 Year ICM trend:       Karie Soda, RN 02/12/2021 3:40 PM

## 2021-02-13 NOTE — Progress Notes (Signed)
Spoke with patient and reports feeling well at this time.  Transmission reviewed.  He could tell last week he had some fluid and took PRN Furosemide.  He is unsure what is causing fluid accumulation over the last month.  Advised to take PRN Furosemide x 1 day and to limit salt intake.

## 2021-02-21 ENCOUNTER — Telehealth: Payer: Self-pay

## 2021-02-21 NOTE — Telephone Encounter (Signed)
Spoke with patient to request missed remote transmission.  He stated his wife has COVID and he has moved to a neighbors house until she is no longer contagious.  He should be able to send remote transmission on 02/26/2021 for review.

## 2021-02-26 ENCOUNTER — Ambulatory Visit (INDEPENDENT_AMBULATORY_CARE_PROVIDER_SITE_OTHER): Payer: Medicare Other

## 2021-02-26 DIAGNOSIS — Z95810 Presence of automatic (implantable) cardiac defibrillator: Secondary | ICD-10-CM

## 2021-02-26 DIAGNOSIS — I5022 Chronic systolic (congestive) heart failure: Secondary | ICD-10-CM

## 2021-02-26 NOTE — Progress Notes (Signed)
EPIC Encounter for ICM Monitoring  Patient Name: Benjamin Frost is a 80 y.o. male Date: 02/26/2021 Primary Care Physican: Leanna Sato, MD Primary Cardiologist:Arida Electrophysiologist:Allred Bi-V Pacing:99% 5/12/2022Office Weight:209lbs   Spoke with patient and reports feeling well at this time. Heart failure questions reviewed. Pt asymptomatic.  Wife had COVID but he remained negative.  He is thinking about getting 2nd booster.  CorvueThoracic impedancesuggesting possible fluid returned to normal.  Prescribed:  Furosemide 20 mg take 1 tablet daily as needed for weight gain of 2 lbs or greater(takes on average once a month).  Spironolactone 25 mg take 1 tablet daily.  Recommendations: No changes and encouraged to call if experiencing any fluid symptoms.  Follow-up plan: ICM clinic phone appointment on6/27/2022. 91 day device clinic remote transmission7/07/2021.  EP/Cardiology Office Visits: Recall 07/31/2021 with Ward Givens, PA.   Copy of ICM check sent to Dr.Allred.  3 month ICM trend: 02/24/2021.    1 Year ICM trend:       Karie Soda, RN 02/26/2021 1:11 PM

## 2021-03-19 ENCOUNTER — Ambulatory Visit (INDEPENDENT_AMBULATORY_CARE_PROVIDER_SITE_OTHER): Payer: Medicare Other

## 2021-03-19 DIAGNOSIS — I5022 Chronic systolic (congestive) heart failure: Secondary | ICD-10-CM

## 2021-03-19 DIAGNOSIS — Z95810 Presence of automatic (implantable) cardiac defibrillator: Secondary | ICD-10-CM

## 2021-03-19 NOTE — Progress Notes (Signed)
EPIC Encounter for ICM Monitoring  Patient Name: Benjamin Frost is a 80 y.o. male Date: 03/19/2021 Primary Care Physican: Leanna Sato, MD Primary Cardiologist: Kirke Corin Electrophysiologist: Allred Bi-V Pacing:  98%           02/01/2021 Office Weight: 209 lbs                                                            Spoke with patient and heart failure questions reviewed.  Pt reporting he is aware he has some fluid because he drank a quart of homemade tomato just had a lot of salt.     Corvue Thoracic impedance suggesting possible fluid accumulation starting 03/17/2021.   Prescribed:  Furosemide 20 mg take 1 tablet daily as needed for weight gain of 2 lbs or greater (takes on average once a month).  Spironolactone 25 mg take 1 tablet daily.   Recommendations:  He will take PRN Furosemide today.  Advised to limit salt intake.   Follow-up plan: ICM clinic phone appointment on 04/23/2021.   91 day device clinic remote transmission 04/02/2021.    EP/Cardiology Office Visits:   Recall 07/31/2021 with Ward Givens, PA.     Copy of ICM check sent to [name].   3 month ICM trend: 03/19/2021.    1 Year ICM trend:       Karie Soda, RN 03/19/2021 11:34 AM

## 2021-04-02 ENCOUNTER — Ambulatory Visit (INDEPENDENT_AMBULATORY_CARE_PROVIDER_SITE_OTHER): Payer: Medicare Other

## 2021-04-02 DIAGNOSIS — I428 Other cardiomyopathies: Secondary | ICD-10-CM | POA: Diagnosis not present

## 2021-04-02 LAB — CUP PACEART REMOTE DEVICE CHECK
Battery Remaining Longevity: 32 mo
Battery Remaining Percentage: 41 %
Battery Voltage: 2.92 V
Brady Statistic AP VP Percent: 1 %
Brady Statistic AP VS Percent: 1 %
Brady Statistic AS VP Percent: 98 %
Brady Statistic AS VS Percent: 1 %
Brady Statistic RA Percent Paced: 1 %
Date Time Interrogation Session: 20220711040017
HighPow Impedance: 78 Ohm
HighPow Impedance: 78 Ohm
Implantable Lead Implant Date: 20180906
Implantable Lead Implant Date: 20180906
Implantable Lead Implant Date: 20180906
Implantable Lead Location: 753858
Implantable Lead Location: 753859
Implantable Lead Location: 753860
Implantable Pulse Generator Implant Date: 20180906
Lead Channel Impedance Value: 1150 Ohm
Lead Channel Impedance Value: 510 Ohm
Lead Channel Impedance Value: 530 Ohm
Lead Channel Pacing Threshold Amplitude: 0.5 V
Lead Channel Pacing Threshold Amplitude: 0.5 V
Lead Channel Pacing Threshold Amplitude: 2.625 V
Lead Channel Pacing Threshold Pulse Width: 0.5 ms
Lead Channel Pacing Threshold Pulse Width: 0.5 ms
Lead Channel Pacing Threshold Pulse Width: 1 ms
Lead Channel Sensing Intrinsic Amplitude: 12 mV
Lead Channel Sensing Intrinsic Amplitude: 3.3 mV
Lead Channel Setting Pacing Amplitude: 2 V
Lead Channel Setting Pacing Amplitude: 2.5 V
Lead Channel Setting Pacing Amplitude: 2.875
Lead Channel Setting Pacing Pulse Width: 0.5 ms
Lead Channel Setting Pacing Pulse Width: 1 ms
Lead Channel Setting Sensing Sensitivity: 0.5 mV
Pulse Gen Serial Number: 7421078

## 2021-04-23 ENCOUNTER — Ambulatory Visit (INDEPENDENT_AMBULATORY_CARE_PROVIDER_SITE_OTHER): Payer: Medicare Other

## 2021-04-23 DIAGNOSIS — I5022 Chronic systolic (congestive) heart failure: Secondary | ICD-10-CM

## 2021-04-23 DIAGNOSIS — Z95810 Presence of automatic (implantable) cardiac defibrillator: Secondary | ICD-10-CM

## 2021-04-24 ENCOUNTER — Telehealth: Payer: Self-pay

## 2021-04-24 NOTE — Telephone Encounter (Signed)
Remote ICM transmission received.  Attempted call to patient regarding ICM remote transmission and mail box is full. 

## 2021-04-24 NOTE — Progress Notes (Signed)
EPIC Encounter for ICM Monitoring  Patient Name: Benjamin Frost is a 81 y.o. male Date: 04/24/2021 Primary Care Physican: Leanna Sato, MD Primary Cardiologist: Kirke Corin Electrophysiologist: Allred Bi-V Pacing:  98%           02/01/2021 Office Weight: 209 lbs                                                            Attempted call to patient and unable to reach.   Transmission reviewed.    Corvue Thoracic impedance suggesting normal fluid levels.   Prescribed:  Furosemide 20 mg take 1 tablet daily as needed for weight gain of 2 lbs or greater (takes on average once a month).  Spironolactone 25 mg take 1 tablet daily.   Recommendations:  Unable to reach.     Follow-up plan: ICM clinic phone appointment on 05/29/2021.   91 day device clinic remote transmission 07/02/2021.    EP/Cardiology Office Visits:   Recall 07/31/2021 with Ward Givens, PA.  Message sent to EP scheduler 8/2 to contact patient for overdue EP appointment.   Copy of ICM check sent to [name].    3 month ICM trend: 04/23/2021.    1 Year ICM trend:       Karie Soda, RN 04/24/2021 3:37 PM

## 2021-04-25 NOTE — Progress Notes (Signed)
Remote ICD transmission.   

## 2021-05-09 NOTE — Progress Notes (Signed)
Electrophysiology Office Note Date: 05/10/2021  ID:  Shahram Alexopoulos, DOB November 10, 1940, MRN [id number]  PCP: Leanna Sato, MD Primary Cardiologist: Lorine Bears, MD Electrophysiologist: Hillis Range, MD   CC: Routine ICD follow-up  Benjamin Frost is a 80 y.o. male seen today for Hillis Range, MD for routine electrophysiology followup.  Since last being seen in our clinic the patient reports doing very well. He continues to have intermittent diaphragmatic stim in a certain position in his recliner. He has "learned to live with it". He denies chest pain, palpitations, dyspnea, PND, orthopnea, nausea, vomiting, dizziness, syncope, edema, weight gain, or early satiety. He has not had ICD shocks.   Device History: STJ CRTD implanted 2018 for NICM, CHF History of appropriate therapy: No History of AAD therapy: No    Past Medical History:  Diagnosis Date   AICD (automatic cardioverter/defibrillator) present    a. St Jude, placed after VF arrest during a dobuatamine echo.    Aortic stenosis, severe    a. 11/04/17: s/p TAVR by [name] and [name]; b. 10/2018 Echo: 61mm Edwards Sapien bioprosthetic AoV. Mean grad (down from on prior study).   Chronic systolic heart failure (HCC)    a. 2015 EF 10-15%; b. 10/2018 Echo: EF 40-45%, nl RV fxn. Mildly dil LA.   COPD (chronic obstructive pulmonary disease) (HCC)    GERD (gastroesophageal reflux disease)    History of BPH    History of kidney stones    Hyperlipidemia    Hypertension    Left bundle branch block    NICM (nonischemic cardiomyopathy) (HCC)    a. 2015 EF 10-15%; b. 03/2014 cath: nl cors; c. S/p SJM CRT-D; d. 10/2018 Echo: EF 40-45%.   S/P TAVR (transcatheter aortic valve replacement)    a. 10/2017: s/p TAVR with an Edwards Sapien 3 THV (size 26 mm, model # 9600TFX, serial # [id number])   Sinus tachycardia    Past Surgical History:  Procedure Laterality Date   BIV ICD INSERTION CRT-D N/A 05/29/2017   SJM Quadra  Assura implanted by Dr Johney Frame for secondary prevention of sudden death   CARD[facility]AC [facility]HE[facility]IZATION  2012   [facility]   CARD[facility]AC [facility]IZATION     [facility]   CATARACT EXTRACTION, BILATERAL     LEFT AND RIGHT HEART CATHETERIZATION WITH CORONARY ANGIOGRAM N/A 03/23/2014   Procedure: LEFT AND RIGHT HEART CATHETERIZATION WITH CORONARY ANGIOGRAM;  Surgeon: [name], MD;  Location: [facility] CATH [facility];  Service: Cardiovascular;  Laterality: N/A;   LEG SURGERY Left    surgery d/t injury from nail gun   RIGHT/LEFT HEART CATH AND CORONARY ANGIOGRAPHY N/A 05/16/2017   Procedure: RIGHT/LEFT HEART CATH AND CORONARY ANGIOGRAPHY;  Surgeon: [name], MD;  [facility]: [facility] INVASIVE [facility];  Service: Cardiovascular;  Laterality: N/A;   TEE WITHOUT CARDIOVERSION N/A 11/04/2017   Procedure: TRANSESOPHAGEAL ECHOCARDIOGRAM (TEE);  Surgeon: [name], MD;  Location: [facility] OR;  Service: Open Heart Surgery;  Laterality: N/A;   TRANSCATHETER AORTIC VALVE REPLACEMENT, TRANSFEMORAL N/A 11/04/2017   Procedure: TRANSCATHETER AORTIC VALVE REPLACEMENT, TRANSFEMORAL;  Surgeon: [name], MD;  Location: [facility] OR;  Service: Open Heart Surgery;  Laterality: N/A;    Current Outpatient Medications  Medication Sig Dispense Refill   albuterol (VENTOLIN HFA) 108 (90 Base) MCG/ACT inhaler Inhale 2 puffs into the lungs every 6 (six) hours as needed for wheezing or shortness of breath.     amoxicillin (AMOXIL) 500 MG tablet Take 2,000 mg (four 500 mg tablets) one hour  prior to dental appointments. 4 tablet 6   aspirin 81 MG tablet Take 81 mg by mouth daily.     atorvastatin (LIPITOR) 40 MG tablet Take 40 mg by mouth daily.     carvedilol (COREG) 6.25 MG tablet Take 1 tablet (6.25 mg total) by mouth 2 (two) times daily. 180 tablet 1   cetirizine (ZYRTEC) 10 MG tablet Take 10 mg by mouth daily.     fluticasone (FLONASE) 50 MCG/ACT nasal spray Place 1 spray into both nostrils daily.     Fluticasone-Salmeterol (ADVAIR) 250-50 MCG/DOSE AEPB  Inhale 1 puff into the lungs 2 (two) times daily.     furosemide (LASIX) 20 MG tablet Take 1 tablet (20 mg total) by mouth daily as needed. For weight gain greater than 2 lb. 30 tablet 1   losartan (COZAAR) 25 MG tablet Take 1 tablet (25 mg total) by mouth daily. 30 tablet 3   pantoprazole (PROTONIX) 40 MG tablet Take 1 tablet (40 mg total) by mouth daily. 30 tablet 0   spironolactone (ALDACTONE) 25 MG tablet Take 1 tablet (25 mg total) by mouth daily. 30 tablet 0   tamsulosin (FLOMAX) 0.4 MG CAPS capsule Take 0.4 mg by mouth daily.     tiotropium (SPIRIVA) 18 MCG inhalation capsule Place 18 mcg into inhaler and inhale daily.     No current facility-administered medications for this visit.    Allergies:   Lisinopril, Dobutamine, Amlodipine, and Metoprolol   Social History: Social History   Socioeconomic History   Marital status: Married    Spouse name: Not on file   Number of children: Not on file   Years of education: Not on file   Highest education level: Not on file  Occupational History   Not on file  Tobacco Use   Smoking status: Former    Years: 0.00    Types: Cigarettes    Quit date: 1992    Years since quitting: 30.6   Smokeless tobacco: Former    Types: Snuff   Tobacco comments:    quit 25 years ago  Vaping Use   Vaping Use: Never used  Substance and Sexual Activity   Alcohol use: Yes    Comment: occasional Beer    Drug use: No   Sexual activity: Not on file  Other Topics Concern   Not on file  Social History Narrative   Not on file   Social Determinants of Health   Financial Resource Strain: Not on file  Food Insecurity: Not on file  Transportation Needs: Not on file  Physical Activity: Not on file  Stress: Not on file  Social Connections: Not on file  Intimate Partner Violence: Not on file    Family History: Family History  Problem Relation Age of Onset   Heart disease Father    Heart failure Father    Hypertension Mother    Hyperlipidemia  Mother     Review of Systems: All other systems reviewed and are otherwise negative except as noted above.   Physical Exam: Vitals:   05/10/21 0922  BP: 130/70  Pulse: 73  SpO2: 93%  Weight: 210 lb 9.6 oz (95.5 kg)  Height: 5\' 9"  (1.753 m)     GEN- The patient is well appearing, alert and oriented x 3 today.   HEENT: normocephalic, atraumatic; sclera clear, conjunctiva pink; hearing intact; oropharynx clear; neck supple, no JVP Lymph- no cervical lymphadenopathy Lungs- Clear to ausculation bilaterally, normal work of breathing.  No wheezes, rales, rhonchi Heart-  Regular rate and rhythm, no murmurs, rubs or gallops, PMI not laterally displaced GI- soft, non-tender, non-distended, bowel sounds present, no hepatosplenomegaly Extremities- no clubbing or cyanosis. No edema; DP/PT/radial pulses 2+ bilaterally MS- no significant deformity or atrophy Skin- warm and dry, no rash or lesion; ICD pocket well healed Psych- euthymic mood, full affect Neuro- strength and sensation are intact  ICD interrogation- reviewed in detail today,  See PACEART report  EKG:  EKG is not ordered today. Personal review of EKG ordered  02/01/21  shows AS-VP at 80 bpm, QRS 128 ms, initial upward deflection but more LBBB pattern in V1, initial downward deflection then small upright in Lead I.   Recent Labs: No results found for requested labs within last 8760 hours.   Wt Readings from Last 3 Encounters:  05/10/21 210 lb 9.6 oz (95.5 kg)  02/01/21 209 lb 6 oz (95 kg)  08/01/20 204 lb 2 oz (92.6 kg)     Other studies Reviewed: Additional studies/ records that were reviewed today include: Previous EP and gen cards notes   Assessment and Plan:  1.  Chronic systolic dysfunction s/p St. Jude CRT-D  euvolemic today Stable on an appropriate medical regimen Normal ICD function See Pace Art report No changes today He has good CRT response by recent EKG.  He has chronic intermittent diaphragmatic stim.  He  has NO alternative vectors that do not either have threshold > 5 or cause stim down to as low as 1.25 V @ 0.8 - 1.0 ms. He has a somewhat elevated threshold on his current vector of D1-M2 (~2.5-2.625).  I will update CXR for completeness. Per previous discussions, he wishes to avoid consideration of lead revision, and wants to "live with it" until gen change. Estimated time to ERI 2 years, 3 months His non paced QRS is > 150 ms in LBBB pattern.  2. Prior VF arrest No recent Ventricular arrhythmias  3. S/p TAVR Stable by most recent echo 10/2018. Per primary cards.  Current medicines are reviewed at length with the patient today.   The patient does not have concerns regarding his medicines.  The following changes were made today:  none  Labs/ tests ordered today include:  Orders Placed This Encounter  Procedures   DG Chest 2 View    Disposition:   Follow up with [name]  6 months .   Dustin Flock, PA-C  05/10/2021 9:52 AM  [facility] [address] [place] [address] [phone] (office) [phone] (fax)

## 2021-05-10 ENCOUNTER — Ambulatory Visit
Admission: RE | Admit: 2021-05-10 | Discharge: 2021-05-10 | Disposition: A | Discharge status: Home / Self Care | Payer: Medicare Other | Attending: Student | Admitting: Student

## 2021-05-10 ENCOUNTER — Ambulatory Visit (INDEPENDENT_AMBULATORY_CARE_PROVIDER_SITE_OTHER): Payer: Medicare Other | Admitting: Student

## 2021-05-10 ENCOUNTER — Ambulatory Visit
Admission: RE | Admit: 2021-05-10 | Discharge: 2021-05-10 | Disposition: A | Discharge status: Home / Self Care | Payer: Medicare Other | Source: Ambulatory Visit | Attending: Student | Admitting: Student

## 2021-05-10 ENCOUNTER — Encounter: Payer: Self-pay | Admitting: Student

## 2021-05-10 ENCOUNTER — Other Ambulatory Visit: Payer: Self-pay

## 2021-05-10 VITALS — BP 130/70 | HR 73 | Ht 69.0 in | Wt 210.6 lb

## 2021-05-10 DIAGNOSIS — Z952 Presence of prosthetic heart valve: Secondary | ICD-10-CM | POA: Diagnosis not present

## 2021-05-10 DIAGNOSIS — I5022 Chronic systolic (congestive) heart failure: Secondary | ICD-10-CM | POA: Insufficient documentation

## 2021-05-10 DIAGNOSIS — I4901 Ventricular fibrillation: Secondary | ICD-10-CM | POA: Insufficient documentation

## 2021-05-10 DIAGNOSIS — Z95810 Presence of automatic (implantable) cardiac defibrillator: Secondary | ICD-10-CM | POA: Insufficient documentation

## 2021-05-10 NOTE — Patient Instructions (Signed)
Medication Instructions:  Your physician recommends that you continue on your current medications as directed. Please refer to the Current Medication list given to you today.  *If you need a refill on your cardiac medications before your next appointment, please call your pharmacy*   Lab Work: None ordered.  If you have labs (blood work) drawn today and your tests are completely normal, you will receive your results only by: MyChart Message (if you have MyChart) OR A paper copy in the mail If you have any lab test that is abnormal or we need to change your treatment, we will call you to review the results.   Testing/Procedures: A chest x-ray takes a picture of the organs and structures inside the chest, including the heart, lungs, and blood vessels. This test can show several things, including, whether the heart is enlarges; whether fluid is building up in the lungs; and whether pacemaker / defibrillator leads are still in place.    Follow-Up: At [facility], you and your health needs are our priority.  As part of our continuing mission to provide you with exceptional heart care, we have created designated Provider Care Teams.  These Care Teams include your primary Cardiologist (physician) and Advanced Practice Providers (APPs -  Physician Assistants and Nurse Practitioners) who all work together to provide you with the care you need, when you need it.  We recommend signing up for the patient portal called "MyChart".  Sign up information is provided on this After Visit Summary.  MyChart is used to connect with patients for Virtual Visits (Telemedicine).  Patients are able to view lab/test results, encounter notes, upcoming appointments, etc.  Non-urgent messages can be sent to your provider as well.   To learn more about what you can do with MyChart, go to [email or web address].    Your next appointment:   6 month(s)  The format for your next appointment:   In Person  Provider:    Hillis Range, MD

## 2021-05-23 LAB — CUP PACEART INCLINIC DEVICE CHECK
Battery Remaining Longevity: 27 mo
Brady Statistic RA Percent Paced: 0.28 %
Brady Statistic RV Percent Paced: 98 %
Date Time Interrogation Session: 20220818160845
HighPow Impedance: 83.25 Ohm
Implantable Lead Implant Date: 20180906
Implantable Lead Implant Date: 20180906
Implantable Lead Implant Date: 20180906
Implantable Lead Location: 753858
Implantable Lead Location: 753859
Implantable Lead Location: 753860
Implantable Pulse Generator Implant Date: 20180906
Lead Channel Impedance Value: 1287.5 Ohm
Lead Channel Impedance Value: 512.5 Ohm
Lead Channel Impedance Value: 550 Ohm
Lead Channel Pacing Threshold Amplitude: 0.5 V
Lead Channel Pacing Threshold Amplitude: 0.5 V
Lead Channel Pacing Threshold Amplitude: 0.5 V
Lead Channel Pacing Threshold Amplitude: 0.5 V
Lead Channel Pacing Threshold Amplitude: 2.75 V
Lead Channel Pacing Threshold Amplitude: 2.75 V
Lead Channel Pacing Threshold Pulse Width: 0.5 ms
Lead Channel Pacing Threshold Pulse Width: 0.5 ms
Lead Channel Pacing Threshold Pulse Width: 0.5 ms
Lead Channel Pacing Threshold Pulse Width: 0.5 ms
Lead Channel Pacing Threshold Pulse Width: 0.8 ms
Lead Channel Pacing Threshold Pulse Width: 0.8 ms
Lead Channel Sensing Intrinsic Amplitude: 3.4 mV
Lead Channel Sensing Intrinsic Amplitude: 6.7 mV
Lead Channel Setting Pacing Amplitude: 2 V
Lead Channel Setting Pacing Amplitude: 2.5 V
Lead Channel Setting Pacing Amplitude: 2.625
Lead Channel Setting Pacing Pulse Width: 0.5 ms
Lead Channel Setting Pacing Pulse Width: 1 ms
Lead Channel Setting Sensing Sensitivity: 0.5 mV
Pulse Gen Serial Number: 7421078

## 2021-05-29 ENCOUNTER — Ambulatory Visit (INDEPENDENT_AMBULATORY_CARE_PROVIDER_SITE_OTHER): Payer: Medicare Other

## 2021-05-29 DIAGNOSIS — I5022 Chronic systolic (congestive) heart failure: Secondary | ICD-10-CM

## 2021-05-29 DIAGNOSIS — Z95810 Presence of automatic (implantable) cardiac defibrillator: Secondary | ICD-10-CM

## 2021-05-30 NOTE — Progress Notes (Signed)
EPIC Encounter for ICM Monitoring  Patient Name: Benjamin Frost is a 80 y.o. male Date: 05/30/2021 Primary Care Physican: Leanna Sato, MD Primary Cardiologist: Kirke Corin Electrophysiologist: Allred Bi-V Pacing:  98%           05/10/2021 Office Weight: 210 lbs                                                            Spoke with patient and heart failure questions reviewed.  Pt symptomatic for fluid accumulation and feeling well.   Corvue Thoracic impedance suggesting normal fluid levels.   Prescribed:  Furosemide 20 mg take 1 tablet daily as needed for weight gain of 2 lbs or greater (takes on average once a month).  Spironolactone 25 mg take 1 tablet daily.   Recommendations:  No changes and encouraged to call if experiencing any fluid symptoms.   Follow-up plan: ICM clinic phone appointment on 07/03/2021.   91 day device clinic remote transmission 07/02/2021.    EP/Cardiology Office Visits:   Recall 07/31/2021 with Ward Givens, PA.    Copy of ICM check sent to [name].   3 month ICM trend: 05/29/2021.    1 Year ICM trend:       Karie Soda, RN 05/30/2021 1:35 PM

## 2021-07-02 ENCOUNTER — Ambulatory Visit (INDEPENDENT_AMBULATORY_CARE_PROVIDER_SITE_OTHER): Payer: Medicare Other

## 2021-07-02 DIAGNOSIS — I428 Other cardiomyopathies: Secondary | ICD-10-CM

## 2021-07-03 ENCOUNTER — Ambulatory Visit (INDEPENDENT_AMBULATORY_CARE_PROVIDER_SITE_OTHER): Payer: Medicare Other

## 2021-07-03 DIAGNOSIS — Z95810 Presence of automatic (implantable) cardiac defibrillator: Secondary | ICD-10-CM

## 2021-07-03 DIAGNOSIS — I5022 Chronic systolic (congestive) heart failure: Secondary | ICD-10-CM | POA: Diagnosis not present

## 2021-07-04 ENCOUNTER — Telehealth: Payer: Self-pay

## 2021-07-04 LAB — CUP PACEART REMOTE DEVICE CHECK
Battery Remaining Longevity: 26 mo
Battery Remaining Percentage: 37 %
Battery Voltage: 2.9 V
Brady Statistic AP VP Percent: 1 %
Brady Statistic AP VS Percent: 1 %
Brady Statistic AS VP Percent: 97 %
Brady Statistic AS VS Percent: 1.2 %
Brady Statistic RA Percent Paced: 1 %
Date Time Interrogation Session: 20221010040017
HighPow Impedance: 86 Ohm
HighPow Impedance: 86 Ohm
Implantable Lead Implant Date: 20180906
Implantable Lead Implant Date: 20180906
Implantable Lead Implant Date: 20180906
Implantable Lead Location: 753858
Implantable Lead Location: 753859
Implantable Lead Location: 753860
Implantable Pulse Generator Implant Date: 20180906
Lead Channel Impedance Value: 1175 Ohm
Lead Channel Impedance Value: 510 Ohm
Lead Channel Impedance Value: 580 Ohm
Lead Channel Pacing Threshold Amplitude: 0.5 V
Lead Channel Pacing Threshold Amplitude: 0.5 V
Lead Channel Pacing Threshold Amplitude: 2.75 V
Lead Channel Pacing Threshold Pulse Width: 0.5 ms
Lead Channel Pacing Threshold Pulse Width: 0.5 ms
Lead Channel Pacing Threshold Pulse Width: 1 ms
Lead Channel Sensing Intrinsic Amplitude: 12 mV
Lead Channel Sensing Intrinsic Amplitude: 4.3 mV
Lead Channel Setting Pacing Amplitude: 2 V
Lead Channel Setting Pacing Amplitude: 2.5 V
Lead Channel Setting Pacing Amplitude: 3 V
Lead Channel Setting Pacing Pulse Width: 0.5 ms
Lead Channel Setting Pacing Pulse Width: 1 ms
Lead Channel Setting Sensing Sensitivity: 0.5 mV
Pulse Gen Serial Number: 7421078

## 2021-07-04 NOTE — Progress Notes (Signed)
EPIC Encounter for ICM Monitoring  Patient Name: Benjamin Frost is a 80 y.o. male Date: 07/04/2021 Primary Care Physican: Leanna Sato, MD Primary Cardiologist: Kirke Corin Electrophysiologist: Allred Bi-V Pacing:  98%           05/10/2021 Office Weight: 210 lbs                                                            Attempted call to patient and unable to reach. Transmission reviewed.    Corvue Thoracic impedance suggesting possible fluid accumulation starting 10/7.   Prescribed:  Furosemide 20 mg take 1 tablet daily as needed for weight gain of 2 lbs or greater (takes on average once a month).  Spironolactone 25 mg take 1 tablet daily.   Recommendations:  Unable to reach.  Will recommend patient take PRN Furosemide if reached.   Follow-up plan: ICM clinic phone appointment on 07/09/2021 (manual) to recheck fluid levels.   91 day device clinic remote transmission 10/01/2021.    EP/Cardiology Office Visits:   Recall 07/31/2021 with Ward Givens, PA.    Copy of ICM check sent to [name] and Dr Kirke Corin as Lorain Childes.   3 month ICM trend: 07/03/2021.    1 Year ICM trend:       Karie Soda, RN 07/04/2021 1:01 PM

## 2021-07-04 NOTE — Telephone Encounter (Signed)
Remote ICM transmission received.  Attempted call to patient on home and cell phone number regarding ICM remote transmission and no answer or voice mail option.   

## 2021-07-09 ENCOUNTER — Telehealth: Payer: Self-pay

## 2021-07-09 NOTE — Telephone Encounter (Signed)
Spoke with patient.  Advised to send remote transmission to recheck fluid levels.  Discussed last week remote transmission results and he was aware he had fluid buildup.  He took a PRN Furosemide last week but still feels Sasha Rogel of breath on movement.  He is not at home today and will send remote transmission for review tomorrow morning.

## 2021-07-10 ENCOUNTER — Ambulatory Visit: Payer: Medicare Other

## 2021-07-10 DIAGNOSIS — I5022 Chronic systolic (congestive) heart failure: Secondary | ICD-10-CM

## 2021-07-10 DIAGNOSIS — Z95810 Presence of automatic (implantable) cardiac defibrillator: Secondary | ICD-10-CM

## 2021-07-10 NOTE — Progress Notes (Signed)
EPIC Encounter for ICM Monitoring  Patient Name: Benjamin Frost is a 80 y.o. male Date: 07/10/2021 Primary Care Physican: Leanna Sato, MD Primary Cardiologist: Kirke Corin Electrophysiologist: Allred Bi-V Pacing:  98%           07/10/2021 Weight: 205 lbs                                                            Spoke with patient and heart failure questions reviewed.  Pt can tell he has fluid but not specific symptoms.  He took Furosemide this AM.  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   Corvue Thoracic impedance suggesting possible fluid accumulation starting 10/7.   Prescribed:  Furosemide 20 mg take 1 tablet daily as needed for weight gain of 2 lbs or greater (takes on average once a month).  Spironolactone 25 mg take 1 tablet daily.   Recommendations:  Advised to take PRN Furosemide x 2-3 days consecutively and then return to PRN.  Encouraged to limit salt intake.   Follow-up plan: ICM clinic phone appointment on 07/18/2021 to recheck fluid levels.   91 day device clinic remote transmission 10/01/2021.    EP/Cardiology Office Visits:   Recall 07/31/2021 with Ward Givens, PA.    Copy of ICM check sent to [name] and Dr Kirke Corin as Lorain Childes.    3 month ICM trend: 07/10/2021.    1 Year ICM trend:       Karie Soda, RN 07/10/2021 10:32 AM

## 2021-07-11 NOTE — Progress Notes (Signed)
Remote ICD transmission.   

## 2021-07-13 ENCOUNTER — Ambulatory Visit (INDEPENDENT_AMBULATORY_CARE_PROVIDER_SITE_OTHER): Payer: Medicare Other

## 2021-07-13 DIAGNOSIS — Z95810 Presence of automatic (implantable) cardiac defibrillator: Secondary | ICD-10-CM

## 2021-07-13 DIAGNOSIS — I5022 Chronic systolic (congestive) heart failure: Secondary | ICD-10-CM

## 2021-07-13 NOTE — Progress Notes (Signed)
EPIC Encounter for ICM Monitoring  Patient Name: Lang Zingg is a 80 y.o. male Date: 07/13/2021 Primary Care Physican: Leanna Sato, MD Primary Cardiologist: Kirke Corin Electrophysiologist: Allred Bi-V Pacing:  98%           07/10/2021 Weight: 205 lbs                                                            Spoke with patient and heart failure questions reviewed.  Pt said shortness of breath resolved.    Corvue Thoracic impedance suggesting possible dryness in response to taking PRN Furosemide for 2-3 days.   Prescribed:  Furosemide 20 mg take 1 tablet daily as needed for weight gain of 2 lbs or greater (takes on average once a month).  Spironolactone 25 mg take 1 tablet daily.   Recommendations: No changes and encouraged to call if experiencing any fluid symptoms.   Follow-up plan: ICM clinic phone appointment on 08/06/2021.   91 day device clinic remote transmission 10/01/2021.    EP/Cardiology Office Visits:   Recall 07/31/2021 with Ward Givens, PA.    Copy of ICM check sent to [name].    3 month ICM trend: 07/13/2021.    1 Year ICM trend:       Karie Soda, RN 07/13/2021 3:23 PM

## 2021-07-31 ENCOUNTER — Ambulatory Visit: Payer: Medicare Other | Admitting: Medical

## 2021-07-31 NOTE — Progress Notes (Deleted)
Cardiology Office Note:    Date:  07/31/2021   ID:  Benjamin Frost, DOB January 03, 1941, MRN [id number]  PCP:  Leanna Sato, MD  [facility] Cardiologist:  Lorine Bears, MD  [facility] Electrophysiologist:  Hillis Range, MD   Referring MD: Leanna Sato, MD   Chief Complaint: 6 month follow-up  History of Present Illness:    Benjamin Frost is a 80 y.o. male with a hx of HFrEF s/p ICD, aortic stenosis s/p TAVR in 2019, LBBB who presents for follow-up. Prior EF was down to 10-15% in 2015. Right and left cardiac cath in July 2015 showed only mild elevated filling pressures with normal cardiac output and no significant pulmonary HTN. Coronary angiogrpahy showed no obstructive CAD. HE had sinus tachycardia that improved with Ivabradine. HE is s/p ICD CRT placement and s/p TAVR in February 2019. Most recent echo 10/2018 showed EF 40-45%, mild biatral enlargement, normal functioning TAVR prosthesis with mean gradient .   Last seen 01/2021 and was doing well from a cardiac perspective.   Today,    Past Medical History:  Diagnosis Date   AICD (automatic cardioverter/defibrillator) present    a. St Jude, placed after VF arrest during a dobuatamine echo.    Aortic stenosis, severe    a. 11/04/17: s/p TAVR by [name] and [name]; b. 10/2018 Echo: 51mm Edwards Sapien bioprosthetic AoV. Mean grad (down from on prior study).   Chronic systolic heart failure (HCC)    a. 2015 EF 10-15%; b. 10/2018 Echo: EF 40-45%, nl RV fxn. Mildly dil LA.   COPD (chronic obstructive pulmonary disease) (HCC)    GERD (gastroesophageal reflux disease)    History of BPH    History of kidney stones    Hyperlipidemia    Hypertension    Left bundle branch block    NICM (nonischemic cardiomyopathy) (HCC)    a. 2015 EF 10-15%; b. 03/2014 cath: nl cors; c. S/p SJM CRT-D; d. 10/2018 Echo: EF 40-45%.   S/P TAVR (transcatheter aortic valve replacement)    a. 10/2017: s/p TAVR with an Edwards  Sapien 3 THV (size 26 mm, model # 9600TFX, serial # [id number])   Sinus tachycardia     Past Surgical History:  Procedure Laterality Date   BIV ICD INSERTION CRT-D N/A 05/29/2017   SJM Quadra Assura implanted by Dr Johney Frame for secondary prevention of sudden death   CARDIAC [facility]HETERIZATION  2012   [facility]   CARD[facility]AC [facility]TERIZATION     [facility]   CATARACT EXTRACTION, BILATERAL     LEFT AND RIGHT HEART CATHETERIZATION WITH CORONARY ANGIOGRAM N/A 03/23/2014   Procedure: LEFT AND RIGHT HEART CATHETERIZATION WITH CORONARY ANGIOGRAM;  Surgeon: [name], MD;  Location: [facility] CATH LAB;  Service: Cardiovascular;  Laterality: N/A;   LEG SURGERY Left    surgery d/t injury from nail gun   RIGHT/LEFT HEART CATH AND CORONARY ANGIOGRAPHY N/A 05/16/2017   Procedure: RIGHT/LEFT HEART CATH AND CORONARY ANGIOGRAPHY;  Surgeon: [name], MD;  Location: [facility] INVASIVE CV LAB;  Service: Cardiovascular;  Laterality: N/A;   TEE WITHOUT CARDIOVERSION N/A 11/04/2017   Procedure: TRANSESOPHAGEAL ECHOCARDIOGRAM (TEE);  Surgeon: [name], MD;  Location[facility] OR;  Service: Open Heart Surgery;  Laterality: N/A;   TRANSCATHETER AORTIC VALVE REPLACEMENT, TRANSFEMORAL N/A 11/04/2017   Procedure: TRANSCATHETER AORTIC VALVE REPLACEMENT, TRANSFEMORAL;  Surgeon: [name], MD;  Location: [facility] OR;  Service: Open Heart Surgery;  Laterality: N/A;    Current Medications: No outpatient medications have been  marked as taking for the 07/31/21 encounter (Appointment) with Fransico Michael, Sreekar Broyhill H, PA-C.     Allergies:   Lisinopril, Dobutamine, Amlodipine, and Metoprolol   Social History   Socioeconomic History   Marital status: Married    Spouse name: Not on file   Number of children: Not on file   Years of education: Not on file   Highest education level: Not on file  Occupational History   Not on file  Tobacco Use   Smoking status: Former    Years: 0.00    Types: Cigarettes    Quit date: 20    Years since quitting: 30.8    Smokeless tobacco: Former    Types: Snuff   Tobacco comments:    quit 25 years ago  Vaping Use   Vaping Use: Never used  Substance and Sexual Activity   Alcohol use: Yes    Comment: occasional Beer    Drug use: No   Sexual activity: Not on file  Other Topics Concern   Not on file  Social History Narrative   Not on file   Social Determinants of Health   Financial Resource Strain: Not on file  Food Insecurity: Not on file  Transportation Needs: Not on file  Physical Activity: Not on file  Stress: Not on file  Social Connections: Not on file     Family History: The patient's ***family history includes Heart disease in his father; Heart failure in his father; Hyperlipidemia in his mother; Hypertension in his mother.  ROS:   Please see the history of present illness.    *** All other systems reviewed and are negative.  EKGs/Labs/Other Studies Reviewed:    The following studies were reviewed today: ***  EKG:  EKG is *** ordered today.  The ekg ordered today demonstrates ***  Recent Labs: No results found for requested labs within last 8760 hours.  Recent Lipid Panel    Component Value Date/Time   CHOL 156 06/04/2018 1116   CHOL 117 02/28/2014 0442   TRIG 120 06/04/2018 1116   TRIG 65 02/28/2014 0442   HDL 57 06/04/2018 1116   HDL 44 02/28/2014 0442   CHOLHDL 2.7 06/04/2018 1116   VLDL 13 02/28/2014 0442   LDLCALC 75 06/04/2018 1116   LDLCALC 60 02/28/2014 0442     Risk Assessment/Calculations:   {Does this patient have ATRIAL FIBRILLATION?[phone]}   Physical Exam:    VS:  There were no vitals taken for this visit.    Wt Readings from Last 3 Encounters:  05/10/21 210 lb 9.6 oz (95.5 kg)  02/01/21 209 lb 6 oz (95 kg)  08/01/20 204 lb 2 oz (92.6 kg)     GEN: *** Well nourished, well developed in no acute distress HEENT: Normal NECK: No JVD; No carotid bruits LYMPHATICS: No lymphadenopathy CARDIAC: ***RRR, no murmurs, rubs, gallops RESPIRATORY:   Clear to auscultation without rales, wheezing or rhonchi  ABDOMEN: Soft, non-tender, non-distended MUSCULOSKELETAL:  No edema; No deformity  SKIN: Warm and dry NEUROLOGIC:  Alert and oriented x 3 PSYCHIATRIC:  Normal affect   ASSESSMENT:    No diagnosis found. PLAN:    In order of problems listed above:  HFrEF  S/p TAVR  HLD  S/p ICD  COP  Disposition: Follow up {follow up:15908} with ***   Shared Decision Making/Informed Consent   {Are you ordering a CV Procedure (e.g. stress test, cath, DCCV, TEE, etc)?   Press F2        :[id number]}  Signed, Davante Gerke David Stall, PA-C  07/31/2021 8:04 AM    [facility]

## 2021-08-01 ENCOUNTER — Encounter: Payer: Self-pay | Admitting: Medical

## 2021-08-14 ENCOUNTER — Ambulatory Visit (INDEPENDENT_AMBULATORY_CARE_PROVIDER_SITE_OTHER): Payer: Medicare Other

## 2021-08-14 DIAGNOSIS — I5022 Chronic systolic (congestive) heart failure: Secondary | ICD-10-CM

## 2021-08-14 DIAGNOSIS — Z95810 Presence of automatic (implantable) cardiac defibrillator: Secondary | ICD-10-CM

## 2021-08-14 NOTE — Progress Notes (Signed)
EPIC Encounter for ICM Monitoring  Patient Name: Benjamin Frost is a 80 y.o. male Date: 08/14/2021 Primary Care Physican: Leanna Sato, MD Primary Cardiologist: Kirke Corin Electrophysiologist: Allred Bi-V Pacing:  98%           07/10/2021 Weight: 205 lbs                                                            Spoke with patient and heart failure questions reviewed.  He is doing well and no fluid symptoms.    Corvue Thoracic impedance suggesting normal fluid levels.   Prescribed:  Furosemide 20 mg take 1 tablet daily as needed for weight gain of 2 lbs or greater (takes on average once a month).  Spironolactone 25 mg take 1 tablet daily.   Recommendations: No changes and encouraged to call if experiencing any fluid symptoms.   Follow-up plan: ICM clinic phone appointment on 09/25/2021.   91 day device clinic remote transmission 10/01/2021.    EP/Cardiology Office Visits:   Recall 07/31/2021 with Ward Givens, PA.    Copy of ICM check sent to [name].     3 month ICM trend: 08/14/2021.    12-14 Month ICM trend:       Karie Soda, RN 08/14/2021 4:49 PM

## 2021-09-25 ENCOUNTER — Ambulatory Visit (INDEPENDENT_AMBULATORY_CARE_PROVIDER_SITE_OTHER): Payer: Medicare Other

## 2021-09-25 DIAGNOSIS — I5022 Chronic systolic (congestive) heart failure: Secondary | ICD-10-CM | POA: Diagnosis not present

## 2021-09-25 DIAGNOSIS — Z95810 Presence of automatic (implantable) cardiac defibrillator: Secondary | ICD-10-CM

## 2021-09-28 NOTE — Progress Notes (Signed)
EPIC Encounter for ICM Monitoring  Patient Name: Benjamin Frost is a 81 y.o. male Date: 09/28/2021 Primary Care Physican: Marguerita Merles, MD Primary Cardiologist: Fletcher Anon Electrophysiologist: Allred Bi-V Pacing:  98%           09/28/2021 Weight: 202 lbs                                                            Spoke with patient and heart failure questions reviewed.  Pt asymptomatic for fluid accumulation.  Reports feeling reasonably well at this time and voices no complaints.    Corvue Thoracic impedance suggesting normal fluid levels but was suggesting possible fluid accumulation 12/19-12/26.   Prescribed:  Furosemide 20 mg take 1 tablet daily as needed for weight gain of 2 lbs or greater (takes on average once a month).  Spironolactone 25 mg take 1 tablet daily.   Recommendations:  No changes and encouraged to call if experiencing any fluid symptoms.   Follow-up plan: ICM clinic phone appointment on 10/29/2021.   91 day device clinic remote transmission 10/01/2021.    EP/Cardiology Office Visits:   10/02/2021 with Dr Fletcher Anon.    Copy of ICM check sent to Dr. Rayann Heman.     3 month ICM trend: 09/25/2021.    12-14 Month ICM trend:     Rosalene Billings, RN 09/28/2021 10:54 AM

## 2021-10-01 ENCOUNTER — Ambulatory Visit (INDEPENDENT_AMBULATORY_CARE_PROVIDER_SITE_OTHER): Payer: Medicare Other

## 2021-10-01 DIAGNOSIS — I428 Other cardiomyopathies: Secondary | ICD-10-CM

## 2021-10-01 LAB — CUP PACEART REMOTE DEVICE CHECK
Battery Remaining Longevity: 24 mo
Battery Remaining Percentage: 34 %
Battery Voltage: 2.9 V
Brady Statistic AP VP Percent: 1 %
Brady Statistic AP VS Percent: 1 %
Brady Statistic AS VP Percent: 97 %
Brady Statistic AS VS Percent: 1.2 %
Brady Statistic RA Percent Paced: 1 %
Date Time Interrogation Session: 20230109164614
HighPow Impedance: 89 Ohm
HighPow Impedance: 89 Ohm
Implantable Lead Implant Date: 20180906
Implantable Lead Implant Date: 20180906
Implantable Lead Implant Date: 20180906
Implantable Lead Location: 753858
Implantable Lead Location: 753859
Implantable Lead Location: 753860
Implantable Pulse Generator Implant Date: 20180906
Lead Channel Impedance Value: 1250 Ohm
Lead Channel Impedance Value: 530 Ohm
Lead Channel Impedance Value: 540 Ohm
Lead Channel Pacing Threshold Amplitude: 0.5 V
Lead Channel Pacing Threshold Amplitude: 0.5 V
Lead Channel Pacing Threshold Amplitude: 2.5 V
Lead Channel Pacing Threshold Pulse Width: 0.5 ms
Lead Channel Pacing Threshold Pulse Width: 0.5 ms
Lead Channel Pacing Threshold Pulse Width: 1 ms
Lead Channel Sensing Intrinsic Amplitude: 11.7 mV
Lead Channel Sensing Intrinsic Amplitude: 3.7 mV
Lead Channel Setting Pacing Amplitude: 2 V
Lead Channel Setting Pacing Amplitude: 2.5 V
Lead Channel Setting Pacing Amplitude: 2.75 V
Lead Channel Setting Pacing Pulse Width: 0.5 ms
Lead Channel Setting Pacing Pulse Width: 1 ms
Lead Channel Setting Sensing Sensitivity: 0.5 mV
Pulse Gen Serial Number: 7421078

## 2021-10-02 ENCOUNTER — Encounter: Payer: Self-pay | Admitting: Cardiovascular Disease

## 2021-10-02 ENCOUNTER — Other Ambulatory Visit: Payer: Self-pay

## 2021-10-02 ENCOUNTER — Ambulatory Visit: Payer: Medicare Other | Admitting: Cardiovascular Disease

## 2021-10-02 VITALS — BP 120/80 | HR 94 | Ht 70.0 in | Wt 211.0 lb

## 2021-10-02 DIAGNOSIS — I10 Essential (primary) hypertension: Secondary | ICD-10-CM | POA: Diagnosis not present

## 2021-10-02 DIAGNOSIS — Z952 Presence of prosthetic heart valve: Secondary | ICD-10-CM

## 2021-10-02 DIAGNOSIS — I5022 Chronic systolic (congestive) heart failure: Secondary | ICD-10-CM | POA: Diagnosis not present

## 2021-10-02 DIAGNOSIS — Z95810 Presence of automatic (implantable) cardiac defibrillator: Secondary | ICD-10-CM | POA: Diagnosis not present

## 2021-10-02 NOTE — Patient Instructions (Signed)

## 2021-10-02 NOTE — Progress Notes (Signed)
Cardiology Office Note   Date:  10/02/2021   ID:  Benjamin Frost, DOB November 18, 1940, MRN [id number]  PCP:  Leanna Sato, MD  Cardiologist:   Lorine Bears, MD   Chief Complaint  Patient presents with   Other    6 month f/u no complaints today. Meds reviewed verbally with pt.      History of Present Illness: Benjamin Frost is a 81 y.o. male who presents for a followup visit regarding chronic systolic heart failure with severely reduced LV systolic function, aortic stenosis and an underlying left bundle branch block. Previous ejection fraction was 10-15% in 2015. Right and  left cardiac catheterization in July, 2015 showed only mildly elevated filling pressures with normal cardiac output and no significant pulmonary hypertension. Coronary angiography showed no obstructive coronary artery disease. He had significant sinus tachycardia that improved significantly with Ivabradine.  He is status post ICD CRT placement.  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   Most recent echocardiogram in February 2020 showed an EF of 40 to 45%, mild biatrial enlargement, normal functioning TAVR prosthesis with mean gradient of 15 mmHg.  He has been doing well with no recent chest pain.  Reports stable exertional dyspnea.  No leg edema.  He takes his medications regularly.  Past Medical History:  Diagnosis Date   AICD (automatic cardioverter/defibrillator) present    a. St Jude, placed after VF arrest during a dobuatamine echo.    Aortic stenosis, severe    a. 11/04/17: s/p TAVR by [name] and [name]; b. 10/2018 Echo: 64mm Edwards Sapien bioprosthetic AoV. Mean grad (down from on prior study).   Chronic systolic heart failure (HCC)    a. 2015 EF 10-15%; b. 10/2018 Echo: EF 40-45%, nl RV fxn. Mildly dil LA.   COPD (chronic obstructive pulmonary disease) (HCC)    GERD (gastroesophageal reflux disease)    History of BPH    History of kidney stones    Hyperlipidemia     Hypertension    Left bundle branch block    NICM (nonischemic cardiomyopathy) (HCC)    a. 2015 EF 10-15%; b. 03/2014 cath: nl cors; c. S/p SJM CRT-D; d. 10/2018 Echo: EF 40-45%.   S/P TAVR (transcatheter aortic valve replacement)    a. 10/2017: s/p TAVR with an Edwards Sapien 3 THV (size 26 mm, model # 9600TFX, serial # [id number])   Sinus tachycardia     Past Surgical History:  Procedure Laterality Date   BIV ICD INSERTION CRT-D N/A 05/29/2017   SJM Quadra Assura implanted by Dr Johney Frame for secondary prevention of sudden death   CARDIAC CATHETERIZATION  2012   [facility]   CARD[facility]AC [facility]IZATION     [facility]   CATARACT EXTRACTION, BILATERAL     LEFT AND RIGHT HEART CATHETERIZATION WITH CORONARY ANGIOGRAM N/A 03/23/2014   Procedure: LEFT AND RIGHT HEART CATHETERIZATION WITH CORONARY ANGIOGRAM;  Surgeon: [name], MD;  Location: [facility] CATH [facility];  Service: Cardiovascular;  Laterality: N/A;   LEG SURGERY Left    surgery d/t injury from nail gun   RIGHT/LEFT HEART CATH AND CORONARY ANGIOGRAPHY N/A 05/16/2017   Procedure: RIGHT/LEFT HEART CATH AND CORONARY ANGIOGRAPHY;  Surgeon: [name], MD;  Location: [facility] INVASIVE [facility];  Service: Cardiovascular;  Laterality: N/A;   TEE WITHOUT CARDIOVERSION N/A 11/04/2017   Procedure: TRANSESOPHAGEAL ECHOCARDIOGRAM (TEE);  Surgeon: [name], MD;  Location: [facility] OR;  Service: Open Heart Surgery;  Laterality: N/A;   TRANSCATHETER AORTIC  VALVE REPLACEMENT, TRANSFEMORAL N/A 11/04/2017   Procedure: TRANSCATHETER AORTIC VALVE REPLACEMENT, TRANSFEMORAL;  Surgeon: [name], MD;  Location: [facility] OR;  Service: Open Heart Surgery;  Laterality: N/A;     Current Outpatient Medications  Medication Sig Dispense Refill   albuterol (VENTOLIN HFA) 108 (90 Base) MCG/ACT inhaler Inhale 2 puffs into the lungs every 6 (six) hours as needed for wheezing or shortness of breath.     amoxicillin (AMOXIL) 500 MG tablet Take 2,000 mg (four 500 mg tablets) one hour prior to dental  appointments. 4 tablet 6   aspirin 81 MG tablet Take 81 mg by mouth daily.     atorvastatin (LIPITOR) 40 MG tablet Take 40 mg by mouth daily.     carvedilol (COREG) 6.25 MG tablet Take 1 tablet (6.25 mg total) by mouth 2 (two) times daily. 180 tablet 1   cetirizine (ZYRTEC) 10 MG tablet Take 10 mg by mouth daily.     fluticasone (FLONASE) 50 MCG/ACT nasal spray Place 1 spray into both nostrils daily.     Fluticasone-Salmeterol (ADVAIR) 250-50 MCG/DOSE AEPB Inhale 1 puff into the lungs 2 (two) times daily.     furosemide (LASIX) 20 MG tablet Take 1 tablet (20 mg total) by mouth daily as needed. For weight gain greater than 2 lb. 30 tablet 1   losartan (COZAAR) 25 MG tablet Take 1 tablet (25 mg total) by mouth daily. 30 tablet 3   pantoprazole (PROTONIX) 40 MG tablet Take 1 tablet (40 mg total) by mouth daily. 30 tablet 0   spironolactone (ALDACTONE) 25 MG tablet Take 1 tablet (25 mg total) by mouth daily. 30 tablet 0   tamsulosin (FLOMAX) 0.4 MG CAPS capsule Take 0.4 mg by mouth daily.     tiotropium (SPIRIVA) 18 MCG inhalation capsule Place 18 mcg into inhaler and inhale daily.     No current facility-administered medications for this visit.    Allergies:   Lisinopril, Dobutamine, Amlodipine, and Metoprolol    Social History:  The patient  reports that he quit smoking about 31 years ago. His smoking use included cigarettes. He has quit using smokeless tobacco.  His smokeless tobacco use included snuff. He reports current alcohol use. He reports that he does not use drugs.   Family History:  The patient's family history includes Heart disease in his father; Heart failure in his father; Hyperlipidemia in his mother; Hypertension in his mother.    ROS:  Please see the history of present illness.   Otherwise, review of systems are positive for none.   All other systems are reviewed and negative.    PHYSICAL EXAM: VS:  BP 120/80 (BP Location: Left Arm, Patient Position: Sitting, Cuff Size:  Normal)    Pulse 94    Ht 5\' 10"  (1.778 m)    Wt 211 lb (95.7 kg)    SpO2 92%    BMI 30.28 kg/m  , BMI Body mass index is 30.28 kg/m. GEN: Well nourished, well developed, in no acute distress  HEENT: normal  Neck: no JVD, carotid bruits, or masses Cardiac: RRR; no  rubs, or gallops,no edema . There is 1/6 systolic murmur in the aortic area . Respiratory: Clear to auscultation, normal work of breathing GI: soft, nontender, nondistended, + BS MS: no deformity or atrophy  Skin: warm and dry, no rash Neuro:  Strength and sensation are intact Psych: euthymic mood, full affect   EKG:  EKG is ordered today. The ekg ordered today demonstrates atrial sensed ventricular paced  rhythm .   Recent Labs: No results found for requested labs within last 8760 hours.    Lipid Panel    Component Value Date/Time   CHOL 156 06/04/2018 1116   CHOL 117 02/28/2014 0442   TRIG 120 06/04/2018 1116   TRIG 65 02/28/2014 0442   HDL 57 06/04/2018 1116   HDL 44 02/28/2014 0442   CHOLHDL 2.7 06/04/2018 1116   VLDL 13 02/28/2014 0442   LDLCALC 75 06/04/2018 1116   LDLCALC 60 02/28/2014 0442      Wt Readings from Last 3 Encounters:  10/02/21 211 lb (95.7 kg)  05/10/21 210 lb 9.6 oz (95.5 kg)  02/01/21 209 lb 6 oz (95 kg)       ASSESSMENT AND PLAN:  1.  Chronic systolic heart failure: Due to nonischemic cardiomyopathy.  He is currently New York Heart Association class II.  Most recent ejection fraction was 40 to 45%. Continue treatment with carvedilol, losartan and spironolactone.  He appears to be euvolemic without a  Diuretic.  He uses furosemide only as needed.  I reviewed most recent labs done in August which showed normal renal function and electrolytes.  CBC was unremarkable.  2.  Status post TAVR  for severe aortic stenosis: Stable.  Most recent echo showed normal functioning prosthesis.  Continue aspirin.  3. Hyperlipidemia: Currently on atorvastatin.  I reviewed most recent lipid profile  done in March which showed an LDL of 81.  4.  Status post ICD CRT: Followed by the EP clinic.  EKG today shows normal functioning pacemaker.  5.  COPD: Seems to be stable.   Disposition:   FU with me in 6 months  Signed,  Lorine Bears, MD  10/02/2021 4:07 PM    [facility]

## 2021-10-09 NOTE — Progress Notes (Signed)
Remote ICD transmission.   

## 2021-10-23 ENCOUNTER — Telehealth: Payer: Self-pay

## 2021-10-23 NOTE — Telephone Encounter (Signed)
Spoke with patient and he wanted to know if he can use a piece of exercise equipment that stretches his back but extending backwards but has support.  His chiropractor advised would not effect his device and can proceed with using the equipment.  Advised that should not effect pacemaker but he may have some dizziness when returning to sitting position.  If he develops any problems he should not continue use.  He had a letter that he needs to make an appointment but unsure which doctor. Advised of Feb recall for Dr Johney Frame and to call the office for appointment.  He verbalized  understanding.

## 2021-10-29 ENCOUNTER — Ambulatory Visit (INDEPENDENT_AMBULATORY_CARE_PROVIDER_SITE_OTHER): Payer: Medicare Other

## 2021-10-29 DIAGNOSIS — Z95810 Presence of automatic (implantable) cardiac defibrillator: Secondary | ICD-10-CM | POA: Diagnosis not present

## 2021-10-29 DIAGNOSIS — I5022 Chronic systolic (congestive) heart failure: Secondary | ICD-10-CM | POA: Diagnosis not present

## 2021-10-30 NOTE — Progress Notes (Signed)
EPIC Encounter for ICM Monitoring  Patient Name: [name] is a 81 y.o. male Date: 10/30/2021 Primary Care Physican: Leanna Sato, MD Primary Cardiologist: Kirke Corin Electrophysiologist: Allred Bi-V Pacing:  98%           09/28/2021 Weight: 202 lbs                                                            Spoke with patient and heart failure questions reviewed.  Pt asymptomatic for fluid accumulation.  Reports feeling reasonably well at this time and reports he is seeing chiropractor for back pain.   Corvue Thoracic impedance suggesting normal fluid levels.   Prescribed:  Furosemide 20 mg take 1 tablet daily as needed for weight gain of 2 lbs or greater (takes on average once a month).  Spironolactone 25 mg take 1 tablet daily.   Recommendations:  No changes and encouraged to call if experiencing any fluid symptoms.   Follow-up plan: ICM clinic phone appointment on 12/03/2021.   91 day device clinic remote transmission 12/31/2021.    EP/Cardiology Office Visits:   11/21/2021 with Otilio Saber, PA.    Copy of ICM check sent to [name].     3 month ICM trend: 10/29/2021.    12-14 Month ICM trend:     Karie Soda, RN 10/30/2021 3:23 PM

## 2021-11-21 ENCOUNTER — Ambulatory Visit: Payer: Medicare Other | Admitting: Student

## 2021-11-21 ENCOUNTER — Encounter: Payer: Self-pay | Admitting: Student

## 2021-11-21 ENCOUNTER — Other Ambulatory Visit: Payer: Self-pay

## 2021-11-21 VITALS — BP 123/83 | HR 85 | Ht 69.0 in | Wt 211.0 lb

## 2021-11-21 DIAGNOSIS — I5022 Chronic systolic (congestive) heart failure: Secondary | ICD-10-CM

## 2021-11-21 DIAGNOSIS — Z952 Presence of prosthetic heart valve: Secondary | ICD-10-CM | POA: Diagnosis not present

## 2021-11-21 DIAGNOSIS — I4901 Ventricular fibrillation: Secondary | ICD-10-CM | POA: Diagnosis not present

## 2021-11-21 LAB — CUP PACEART INCLINIC DEVICE CHECK
Battery Remaining Longevity: 24 mo
Brady Statistic RA Percent Paced: 0.42 %
Brady Statistic RV Percent Paced: 98 %
Date Time Interrogation Session: 20230301125304
HighPow Impedance: 86.625
Implantable Lead Implant Date: 20180906
Implantable Lead Implant Date: 20180906
Implantable Lead Implant Date: 20180906
Implantable Lead Location: 753858
Implantable Lead Location: 753859
Implantable Lead Location: 753860
Implantable Pulse Generator Implant Date: 20180906
Lead Channel Impedance Value: 1387.5 Ohm
Lead Channel Impedance Value: 562.5 Ohm
Lead Channel Impedance Value: 562.5 Ohm
Lead Channel Pacing Threshold Amplitude: 0.5 V
Lead Channel Pacing Threshold Amplitude: 0.5 V
Lead Channel Pacing Threshold Amplitude: 0.5 V
Lead Channel Pacing Threshold Amplitude: 0.5 V
Lead Channel Pacing Threshold Amplitude: 2.625 V
Lead Channel Pacing Threshold Pulse Width: 0.5 ms
Lead Channel Pacing Threshold Pulse Width: 0.5 ms
Lead Channel Pacing Threshold Pulse Width: 0.5 ms
Lead Channel Pacing Threshold Pulse Width: 0.5 ms
Lead Channel Pacing Threshold Pulse Width: 1 ms
Lead Channel Sensing Intrinsic Amplitude: 3 mV
Lead Channel Sensing Intrinsic Amplitude: 5.1 mV
Lead Channel Setting Pacing Amplitude: 2 V
Lead Channel Setting Pacing Amplitude: 2.5 V
Lead Channel Setting Pacing Amplitude: 2.875
Lead Channel Setting Pacing Pulse Width: 0.5 ms
Lead Channel Setting Pacing Pulse Width: 1 ms
Lead Channel Setting Sensing Sensitivity: 0.5 mV
Pulse Gen Serial Number: 7421078

## 2021-11-21 NOTE — Progress Notes (Signed)
Electrophysiology Office Note Date: 11/21/2021  ID:  Calton Harshfield, DOB [birth year], MRN [id number]  PCP: Leanna Sato, MD Primary Cardiologist: Lorine Bears, MD Electrophysiologist: Hillis Range, MD   CC: Routine ICD follow-up  Benjamin Frost is a 81 y.o. male seen today for Hillis Range, MD for routine electrophysiology followup.  Since last being seen in our clinic the patient reports doing very well overall. Continues to have daily diaphragmatic stim. Not significant or limiting. Continues to not want revision. he denies chest pain, palpitations, dyspnea, PND, orthopnea, nausea, vomiting, dizziness, syncope, edema, weight gain, or early satiety. He has not had ICD shocks.   Device History: STJ CRTD implanted 2018 for NICM, CHF History of appropriate therapy: No History of AAD therapy: No  Past Medical History:  Diagnosis Date   AICD (automatic cardioverter/defibrillator) present    a. St Jude, placed after VF arrest during a dobuatamine echo.    Aortic stenosis, severe    a. 11/04/17: s/p TAVR by [name] and [name]; b. 10/2018 Echo: 23mm Edwards Sapien bioprosthetic AoV. Mean grad (down from on prior study).   Chronic systolic heart failure (HCC)    a. 2015 EF 10-15%; b. 10/2018 Echo: EF 40-45%, nl RV fxn. Mildly dil LA.   COPD (chronic obstructive pulmonary disease) (HCC)    GERD (gastroesophageal reflux disease)    History of BPH    History of kidney stones    Hyperlipidemia    Hypertension    Left bundle branch block    NICM (nonischemic cardiomyopathy) (HCC)    a. 2015 EF 10-15%; b. 03/2014 cath: nl cors; c. S/p SJM CRT-D; d. 10/2018 Echo: EF 40-45%.   S/P TAVR (transcatheter aortic valve replacement)    a. 10/2017: s/p TAVR with an Edwards Sapien 3 THV (size 26 mm, model # 9600TFX, serial # [id number])   Sinus tachycardia    Past Surgical History:  Procedure Laterality Date   BIV ICD INSERTION CRT-D N/A 05/29/2017   SJM Quadra Assura implanted by  Dr Johney Frame for secondary prevention of sudden death   CARDIAC CATHETERIZATION  2012   [facility]   CARDIAC CATHETERIZATION     [facility]   CATARACT EXTRACTION, BILATERAL     LEFT AND RIGHT HEART CATHETERIZATION WITH CORONARY ANGIOGRAM N/A 03/23/2014   Procedure: LEFT AND RIGHT HEART CATHETERIZATION WITH CORONARY ANGIOGRAM;  Surgeon: [name], MD;  Location: [facility] CATH LAB;  Service: Cardiovascular;  Laterality: N/A;   LEG SURGERY Left    surgery d/t injury from nail gun   RIGHT/LEFT HEART CATH AND CORONARY ANGIOGRAPHY N/A 05/16/2017   Procedure: RIGHT/LEFT HEART CATH AND CORONARY ANGIOGRAPHY;  Surgeon: [name], MD;  Location: [facility] INVASIVE CV LAB;  Service: Cardiovascular;  Laterality: N/A;   TEE WITHOUT CARDIOVERSION N/A 11/04/2017   Procedure: TRANSESOPHAGEAL ECHOCARDIOGRAM (TEE);  Surgeon: [name], MD;  Location: [facility] OR;  Service: Open Heart Surgery;  Laterality: N/A;   TRANSCATHETER AORTIC VALVE REPLACEMENT, TRANSFEMORAL N/A 11/04/2017   Procedure: TRANSCATHETER AORTIC VALVE REPLACEMENT, TRANSFEMORAL;  Surgeon: [name], MD;  Location: [facility] OR;  Service: Open Heart Surgery;  Laterality: N/A;    Current Outpatient Medications  Medication Sig Dispense Refill   albuterol (VENTOLIN HFA) 108 (90 Base) MCG/ACT inhaler Inhale 2 puffs into the lungs every 6 (six) hours as needed for wheezing or shortness of breath.     amoxicillin (AMOXIL) 500 MG tablet Take 2,000 mg (four 500 mg tablets) one hour prior to dental appointments. 4 tablet 6  aspirin 81 MG tablet Take 81 mg by mouth daily.     atorvastatin (LIPITOR) 40 MG tablet Take 40 mg by mouth daily.     carvedilol (COREG) 6.25 MG tablet Take 1 tablet (6.25 mg total) by mouth 2 (two) times daily. 180 tablet 1   cetirizine (ZYRTEC) 10 MG tablet Take 10 mg by mouth daily.     fluticasone (FLONASE) 50 MCG/ACT nasal spray Place 1 spray into both nostrils daily.     Fluticasone-Salmeterol (ADVAIR) 250-50 MCG/DOSE AEPB Inhale 1 puff into the  lungs 2 (two) times daily.     furosemide (LASIX) 20 MG tablet Take 1 tablet (20 mg total) by mouth daily as needed. For weight gain greater than 2 lb. 30 tablet 1   losartan (COZAAR) 25 MG tablet Take 1 tablet (25 mg total) by mouth daily. 30 tablet 3   pantoprazole (PROTONIX) 40 MG tablet Take 1 tablet (40 mg total) by mouth daily. 30 tablet 0   spironolactone (ALDACTONE) 25 MG tablet Take 1 tablet (25 mg total) by mouth daily. 30 tablet 0   tamsulosin (FLOMAX) 0.4 MG CAPS capsule Take 0.4 mg by mouth daily.     tiotropium (SPIRIVA) 18 MCG inhalation capsule Place 18 mcg into inhaler and inhale daily.     No current facility-administered medications for this visit.    Allergies:   Lisinopril, Dobutamine, Amlodipine, and Metoprolol   Social History: Social History   Socioeconomic History   Marital status: Married    Spouse name: Not on file   Number of children: Not on file   Years of education: Not on file   Highest education level: Not on file  Occupational History   Not on file  Tobacco Use   Smoking status: Former    Years: 0.00    Types: Cigarettes    Quit date: 1992    Years since quitting: 31.1   Smokeless tobacco: Former    Types: Snuff   Tobacco comments:    quit 25 years ago  Vaping Use   Vaping Use: Never used  Substance and Sexual Activity   Alcohol use: Yes    Comment: occasional Beer    Drug use: No   Sexual activity: Not on file  Other Topics Concern   Not on file  Social History Narrative   Not on file   Social Determinants of Health   Financial Resource Strain: Not on file  Food Insecurity: Not on file  Transportation Needs: Not on file  Physical Activity: Not on file  Stress: Not on file  Social Connections: Not on file  Intimate Partner Violence: Not on file    Family History: Family History  Problem Relation Age of Onset   Heart disease Father    Heart failure Father    Hypertension Mother    Hyperlipidemia Mother     Review of  Systems: All other systems reviewed and are otherwise negative except as noted above.   Physical Exam: Vitals:   11/21/21 1201  BP: 123/83  Pulse: 85  SpO2: 94%  Weight: 211 lb (95.7 kg)  Height: 5\' 9"  (1.753 m)     GEN- The patient is well appearing, alert and oriented x 3 today.   HEENT: normocephalic, atraumatic; sclera clear, conjunctiva pink; hearing intact; oropharynx clear; neck supple, no JVP Lymph- no cervical lymphadenopathy Lungs- Clear to ausculation bilaterally, normal work of breathing.  No wheezes, rales, rhonchi Heart- Regular rate and rhythm, no murmurs, rubs or gallops, PMI not  laterally displaced GI- soft, non-tender, non-distended, bowel sounds present, no hepatosplenomegaly Extremities- no clubbing or cyanosis. No edema; DP/PT/radial pulses 2+ bilaterally MS- no significant deformity or atrophy Skin- warm and dry, no rash or lesion; ICD pocket well healed Psych- euthymic mood, full affect Neuro- strength and sensation are intact  ICD interrogation- reviewed in detail today,  See PACEART report  EKG:  EKG is not ordered today. Personal review of EKG ordered  10/02/2021  shows A sensed BiV paced at 94 bpm  Recent Labs: No results found for requested labs within last 8760 hours.   Wt Readings from Last 3 Encounters:  11/21/21 211 lb (95.7 kg)  10/02/21 211 lb (95.7 kg)  05/10/21 210 lb 9.6 oz (95.5 kg)     Other studies Reviewed: Additional studies/ records that were reviewed today include: Previous EP office notes.   Assessment and Plan:  1.  Chronic systolic dysfunction s/p St. Jude CRT-D  euvolemic today Stable on an appropriate medical regimen Normal ICD function See Pace Art report No changes today He has good CRT response by recent EKG.  He has chronic intermittent diaphragmatic stim.  He has NO alternative vectors that do not either have threshold > 5 or cause stim down to as low as 1.25 V @ 0.8 - 1.0 ms. He has a somewhat elevated threshold  on his current vector of D1-M2 (~2.5-2.625).  CXR  05/01/2021 without significant change.  Per previous discussions, he wishes to avoid consideration of lead revision, and wants to "live with it" until gen change. Estimated time to ERI 2 years His non paced QRS is > 150 ms in LBBB pattern.   2. 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    3. S/p TAVR Stable by most recent echo 10/2018. Per primary cards.  Current medicines are reviewed at length with the patient today.    Disposition:   Follow up with [name] in 6 months   Signed, Graciella Freer, PA-C  11/21/2021 12:53 PM  [facility] [address] [place] [address] [phone] (office) [phone] (fax)

## 2021-11-21 NOTE — Patient Instructions (Signed)
Medication Instructions:  Your physician recommends that you continue on your current medications as directed. Please refer to the Current Medication list given to you today.  *If you need a refill on your cardiac medications before your next appointment, please call your pharmacy*   Lab Work: None If you have labs (blood work) drawn today and your tests are completely normal, you will receive your results only by: MyChart Message (if you have MyChart) OR A paper copy in the mail If you have any lab test that is abnormal or we need to change your treatment, we will call you to review the results.   Follow-Up: At [facility], you and your health needs are our priority.  As part of our continuing mission to provide you with exceptional heart care, we have created designated Provider Care Teams.  These Care Teams include your primary Cardiologist (physician) and Advanced Practice Providers (APPs -  Physician Assistants and Nurse Practitioners) who all work together to provide you with the care you need, when you need it.  We recommend signing up for the patient portal called "MyChart".  Sign up information is provided on this After Visit Summary.  MyChart is used to connect with patients for Virtual Visits (Telemedicine).  Patients are able to view lab/test results, encounter notes, upcoming appointments, etc.  Non-urgent messages can be sent to your provider as well.   To learn more about what you can do with MyChart, go to [email or web address].    Your next appointment:   6 month(s)  The format for your next appointment:   In Person  Provider:   Cameron Lambert, MD{   

## 2021-12-03 ENCOUNTER — Ambulatory Visit (INDEPENDENT_AMBULATORY_CARE_PROVIDER_SITE_OTHER): Payer: Medicare Other

## 2021-12-03 DIAGNOSIS — I5022 Chronic systolic (congestive) heart failure: Secondary | ICD-10-CM | POA: Diagnosis not present

## 2021-12-03 DIAGNOSIS — Z95810 Presence of automatic (implantable) cardiac defibrillator: Secondary | ICD-10-CM

## 2021-12-04 NOTE — Progress Notes (Signed)
EPIC Encounter for ICM Monitoring ? ?Patient Name: Benjamin Frost is a 81 y.o. male ?Date: 12/04/2021 ?Primary Care Physican: Leanna Sato, MD ?Primary Cardiologist: Kirke Corin ?Electrophysiologist: Allred ?Bi-V Pacing:  97%           ?12/04/2021 Weight: 198 lbs ?                                                         ?  ?Spoke with patient and heart failure questions reviewed.  Pt asymptomatic for fluid accumulation.  Reports feeling well at this time and voices no complaints.  ?  ?Corvue Thoracic impedance suggesting normal fluid levels. ?  ?Prescribed:  ?Furosemide 20 mg take 1 tablet daily as needed for weight gain of 2 lbs or greater (takes on average once a month).  ?Spironolactone 25 mg take 1 tablet daily. ?  ?Recommendations:  No changes and encouraged to call if experiencing any fluid symptoms. ?  ?Follow-up plan: ICM clinic phone appointment on 01/07/2022.   91 day device clinic remote transmission 12/31/2021.  ?  ?EP/Cardiology Office Visits:  Recall 03/31/2022 with Dr Kirke Corin.  Recall 05/20/2022 with Dr [name].  ?  ?Copy of ICM check sent to [name].    ? ?3 month ICM trend: 12/03/2021. ? ? ? ?12-14 Month ICM trend:  ? ? ? ?Karie Soda, RN ?12/04/2021 ?9:03 AM ? ?

## 2021-12-31 ENCOUNTER — Ambulatory Visit (INDEPENDENT_AMBULATORY_CARE_PROVIDER_SITE_OTHER): Payer: Medicare Other

## 2021-12-31 DIAGNOSIS — I428 Other cardiomyopathies: Secondary | ICD-10-CM | POA: Diagnosis not present

## 2022-01-01 LAB — CUP PACEART REMOTE DEVICE CHECK
Battery Remaining Longevity: 23 mo
Battery Remaining Percentage: 32 %
Battery Voltage: 2.89 V
Brady Statistic AP VP Percent: 1.6 %
Brady Statistic AP VS Percent: 1 %
Brady Statistic AS VP Percent: 95 %
Brady Statistic AS VS Percent: 2.1 %
Brady Statistic RA Percent Paced: 1 %
Date Time Interrogation Session: 20230410040017
HighPow Impedance: 83 Ohm
HighPow Impedance: 83 Ohm
Implantable Lead Implant Date: 20180906
Implantable Lead Implant Date: 20180906
Implantable Lead Implant Date: 20180906
Implantable Lead Location: 753858
Implantable Lead Location: 753859
Implantable Lead Location: 753860
Implantable Pulse Generator Implant Date: 20180906
Lead Channel Impedance Value: 1175 Ohm
Lead Channel Impedance Value: 460 Ohm
Lead Channel Impedance Value: 490 Ohm
Lead Channel Pacing Threshold Amplitude: 0.5 V
Lead Channel Pacing Threshold Amplitude: 0.5 V
Lead Channel Pacing Threshold Amplitude: 2.625 V
Lead Channel Pacing Threshold Pulse Width: 0.5 ms
Lead Channel Pacing Threshold Pulse Width: 0.5 ms
Lead Channel Pacing Threshold Pulse Width: 1 ms
Lead Channel Sensing Intrinsic Amplitude: 11.7 mV
Lead Channel Sensing Intrinsic Amplitude: 3.5 mV
Lead Channel Setting Pacing Amplitude: 2 V
Lead Channel Setting Pacing Amplitude: 2.5 V
Lead Channel Setting Pacing Amplitude: 2.875
Lead Channel Setting Pacing Pulse Width: 0.5 ms
Lead Channel Setting Pacing Pulse Width: 1 ms
Lead Channel Setting Sensing Sensitivity: 0.5 mV
Pulse Gen Serial Number: 7421078

## 2022-01-07 ENCOUNTER — Ambulatory Visit (INDEPENDENT_AMBULATORY_CARE_PROVIDER_SITE_OTHER): Payer: Medicare Other

## 2022-01-07 DIAGNOSIS — Z95810 Presence of automatic (implantable) cardiac defibrillator: Secondary | ICD-10-CM | POA: Diagnosis not present

## 2022-01-07 DIAGNOSIS — I5022 Chronic systolic (congestive) heart failure: Secondary | ICD-10-CM | POA: Diagnosis not present

## 2022-01-09 NOTE — Progress Notes (Signed)
EPIC Encounter for ICM Monitoring ? ?Patient Name: Benjamin Frost is a 81 y.o. male ?Date: 01/09/2022 ?Primary Care Physican: Leanna Sato, MD ?Primary Cardiologist: Kirke Corin ?Electrophysiologist: Allred ?Bi-V Pacing:  97%           ?12/04/2021 Weight: 198 lbs ?                                                         ?  ?Spoke with patient and heart failure questions reviewed.  Pt asymptomatic for fluid accumulation.  Reports feeling well at this time and voices no complaints.  ?  ?Corvue Thoracic impedance suggesting normal fluid levels. ?  ?Prescribed:  ?Furosemide 20 mg take 1 tablet daily as needed for weight gain of 2 lbs or greater (takes on average once a month).  ?Spironolactone 25 mg take 1 tablet daily. ?  ?Recommendations:  No changes and encouraged to call if experiencing any fluid symptoms. ?  ?Follow-up plan: ICM clinic phone appointment on 02/11/2022.   91 day device clinic remote transmission 04/01/2022.  ?  ?EP/Cardiology Office Visits:  Recall 03/31/2022 with Dr Kirke Corin.  Recall 05/20/2022 with Dr [name].  ?  ?Copy of ICM check sent to [name].   ? ?3 month ICM trend: 01/07/2022. ? ? ? ?12-14 Month ICM trend:  ? ? ? ?Karie Soda, RN ?01/09/2022 ?3:31 PM ? ?

## 2022-01-15 NOTE — Progress Notes (Signed)
Remote ICD transmission.   

## 2022-02-11 ENCOUNTER — Ambulatory Visit (INDEPENDENT_AMBULATORY_CARE_PROVIDER_SITE_OTHER): Payer: Medicare Other

## 2022-02-11 DIAGNOSIS — I5022 Chronic systolic (congestive) heart failure: Secondary | ICD-10-CM | POA: Diagnosis not present

## 2022-02-11 DIAGNOSIS — Z95810 Presence of automatic (implantable) cardiac defibrillator: Secondary | ICD-10-CM

## 2022-02-11 NOTE — Progress Notes (Signed)
EPIC Encounter for ICM Monitoring  Patient Name: Benjamin Frost is a 81 y.o. male Date: 02/11/2022 Primary Care Physican: Leanna Sato, MD Primary Cardiologist: Kirke Corin Electrophysiologist: Allred Bi-V Pacing:  97%           12/04/2021 Weight: 198 lbs                                                            Spoke with patient and heart failure questions reviewed.  Pt did have weight gain during decreased impedance and has only taken 1 PRN dose of Furosemide.  He eats foods higher in salt when his wife is not cooking and out of town.     Corvue Thoracic impedance suggesting possible dryness starting 5/17.  Also suggesting possible dryness 5/2-5/6 and possible fluid accumulation from 4/23-5/1 and 5/6-5/16.   Prescribed:  Furosemide 20 mg take 1 tablet daily as needed for weight gain of 2 lbs or greater (takes on average once a month).  Spironolactone 25 mg take 1 tablet daily.   Recommendations:  Advised to be consistent with salt and fluid intake.  Recommended to limit salt to 2000 mg daily and fluid intake to 64 oz daily.  Advised to increase fluid over the next 2 days since report is suggesting possible dryness.     Follow-up plan: ICM clinic phone appointment on 02/19/2022 to recheck fluid levels.   91 day device clinic remote transmission 04/01/2022.    EP/Cardiology Office Visits:  Recall 03/31/2022 with Dr Kirke Corin.  Recall 05/20/2022 with Dr [name].    Copy of ICM check sent to [name].    3 month ICM trend: 02/11/2022.    12-14 Month ICM trend:     Karie Soda, RN 02/11/2022 3:30 PM

## 2022-02-19 ENCOUNTER — Ambulatory Visit (INDEPENDENT_AMBULATORY_CARE_PROVIDER_SITE_OTHER): Payer: Medicare Other

## 2022-02-19 DIAGNOSIS — Z95810 Presence of automatic (implantable) cardiac defibrillator: Secondary | ICD-10-CM

## 2022-02-19 DIAGNOSIS — I5022 Chronic systolic (congestive) heart failure: Secondary | ICD-10-CM

## 2022-02-19 NOTE — Progress Notes (Signed)
EPIC Encounter for ICM Monitoring  Patient Name: Benjamin Frost is a 81 y.o. male Date: 02/19/2022 Primary Care Physican: Leanna Sato, MD Primary Cardiologist: Kirke Corin Electrophysiologist: Allred Bi-V Pacing:  97%           12/04/2021 Weight: 198 lbs 02/19/2022 Weight: 200 lbs                                                            Spoke with patient and heart failure questions reviewed.  Pt reports he is feeling well.    Corvue Thoracic impedance suggesting possible dryness from 5/17-5/24 and 5/26-5/29 and returned to normal 5/30.    Prescribed:  Furosemide 20 mg take 1 tablet daily as needed for weight gain of 2 lbs or greater (takes on average once a month).  Spironolactone 25 mg take 1 tablet daily.   Recommendations:  Advised to be consistent with fluid intake.  Recommended to limit salt to 2000 mg daily and fluid intake to 64 oz daily.  .     Follow-up plan: ICM clinic phone appointment on 03/18/2022.   91 day device clinic remote transmission 04/01/2022.    EP/Cardiology Office Visits:  Recall 03/31/2022 with Dr Kirke Corin.  Recall 05/20/2022 with Dr [name].    Copy of ICM check sent to [name].    3 month ICM trend: 02/19/2022.    12-14 Month ICM trend:     Karie Soda, RN 02/19/2022 3:51 PM

## 2022-03-18 ENCOUNTER — Ambulatory Visit (INDEPENDENT_AMBULATORY_CARE_PROVIDER_SITE_OTHER): Payer: Medicare Other

## 2022-03-18 DIAGNOSIS — I5022 Chronic systolic (congestive) heart failure: Secondary | ICD-10-CM | POA: Diagnosis not present

## 2022-03-18 DIAGNOSIS — Z95810 Presence of automatic (implantable) cardiac defibrillator: Secondary | ICD-10-CM | POA: Diagnosis not present

## 2022-03-19 ENCOUNTER — Other Ambulatory Visit: Payer: Self-pay | Admitting: Cardiovascular Disease

## 2022-03-19 NOTE — Telephone Encounter (Signed)
Please schedule 6 month F/U appt. Thank you!

## 2022-03-25 ENCOUNTER — Telehealth: Payer: Self-pay

## 2022-03-25 MED ORDER — FUROSEMIDE 20 MG PO TABS: 20.0000 mg | ORAL_TABLET | Freq: Every day | ORAL | 0 refills | Status: DC | PRN

## 2022-03-25 NOTE — Telephone Encounter (Signed)
Requested Prescriptions   Signed Prescriptions Disp Refills   furosemide (LASIX) 20 MG tablet 30 tablet 0    Sig: Take 1 tablet (20 mg total) by mouth daily as needed. For weight gain greater than 2 lb.    Authorizing Provider: Lorine Bears A    Ordering User: Kendrick Fries

## 2022-03-25 NOTE — Telephone Encounter (Signed)
Spoke with patient.  He requested Furosemide refill that he takes PRN. He is currently out of the medication.  Confirmed [facility] as preferred pharmacy.  He is a patient of Dr Kirke Corin and Dr Graciela Husbands. Request sent to [place] office for refill.

## 2022-03-25 NOTE — Addendum Note (Signed)
Addended by: Kendrick Fries on: 03/25/2022 11:43 AM   Modules accepted: Orders

## 2022-03-27 ENCOUNTER — Ambulatory Visit (INDEPENDENT_AMBULATORY_CARE_PROVIDER_SITE_OTHER): Payer: Medicare Other

## 2022-03-27 ENCOUNTER — Telehealth: Payer: Self-pay

## 2022-03-27 NOTE — Progress Notes (Signed)
Spoke with patient and advised fluid levels returned to normal.  He is doing well.  No changes and encouraged to call if experiencing any fluid symptoms.

## 2022-03-27 NOTE — Progress Notes (Signed)
EPIC Encounter for ICM Monitoring  Patient Name: Benjamin Frost is a 81 y.o. male Date: 03/27/2022 Primary Care Physican: Leanna Sato, MD Primary Cardiologist: Kirke Corin Electrophysiologist: Allred Bi-V Pacing:  97%           12/04/2021 Weight: 198 lbs 03/19/2022 Weight: 198-200 lbs                                                            Attempted call to patient and unable to reach.  Transmission reviewed.    Corvue Thoracic impedance suggesting fluid levels returned to normal.    Prescribed:  Furosemide 20 mg take 1 tablet daily as needed for weight gain of 2 lbs or greater (takes on average once a month).  Spironolactone 25 mg take 1 tablet daily.   Recommendations:  Unable to reach.      Follow-up plan: ICM clinic phone appointment on 04/29/2022.   91 day device clinic remote transmission 04/01/2022.    EP/Cardiology Office Visits:  05/29/2022 with Ward Givens, NP and Dr [name].      Copy of ICM check sent to [name].    3 month ICM trend: 03/27/2022.    12-14 Month ICM trend:     Karie Soda, RN 03/27/2022 3:49 PM

## 2022-03-27 NOTE — Telephone Encounter (Signed)
Remote ICM transmission received.  Attempted call to patient regarding ICM remote transmission and mail box is full. 

## 2022-04-01 ENCOUNTER — Ambulatory Visit (INDEPENDENT_AMBULATORY_CARE_PROVIDER_SITE_OTHER): Payer: Medicare Other

## 2022-04-01 DIAGNOSIS — I5022 Chronic systolic (congestive) heart failure: Secondary | ICD-10-CM

## 2022-04-02 LAB — CUP PACEART REMOTE DEVICE CHECK
Battery Remaining Longevity: 22 mo
Battery Remaining Percentage: 30 %
Battery Voltage: 2.87 V
Brady Statistic AP VP Percent: 1.4 %
Brady Statistic AP VS Percent: 1 %
Brady Statistic AS VP Percent: 96 %
Brady Statistic AS VS Percent: 1.9 %
Brady Statistic RA Percent Paced: 1 %
Date Time Interrogation Session: 20230710185006
HighPow Impedance: 78 Ohm
HighPow Impedance: 78 Ohm
Implantable Lead Implant Date: 20180906
Implantable Lead Implant Date: 20180906
Implantable Lead Implant Date: 20180906
Implantable Lead Location: 753858
Implantable Lead Location: 753859
Implantable Lead Location: 753860
Implantable Pulse Generator Implant Date: 20180906
Lead Channel Impedance Value: 1175 Ohm
Lead Channel Impedance Value: 480 Ohm
Lead Channel Impedance Value: 490 Ohm
Lead Channel Pacing Threshold Amplitude: 0.5 V
Lead Channel Pacing Threshold Amplitude: 0.5 V
Lead Channel Pacing Threshold Amplitude: 2.375 V
Lead Channel Pacing Threshold Pulse Width: 0.5 ms
Lead Channel Pacing Threshold Pulse Width: 0.5 ms
Lead Channel Pacing Threshold Pulse Width: 1 ms
Lead Channel Sensing Intrinsic Amplitude: 3.8 mV
Lead Channel Sensing Intrinsic Amplitude: 9.6 mV
Lead Channel Setting Pacing Amplitude: 2 V
Lead Channel Setting Pacing Amplitude: 2.5 V
Lead Channel Setting Pacing Amplitude: 2.625
Lead Channel Setting Pacing Pulse Width: 0.5 ms
Lead Channel Setting Pacing Pulse Width: 1 ms
Lead Channel Setting Sensing Sensitivity: 0.5 mV
Pulse Gen Serial Number: 7421078

## 2022-04-25 NOTE — Progress Notes (Signed)
Remote reviewed. Battery status noted.  Leads function stable

## 2022-04-29 ENCOUNTER — Ambulatory Visit (INDEPENDENT_AMBULATORY_CARE_PROVIDER_SITE_OTHER): Payer: Medicare Other

## 2022-04-29 DIAGNOSIS — Z95810 Presence of automatic (implantable) cardiac defibrillator: Secondary | ICD-10-CM

## 2022-04-29 DIAGNOSIS — I5022 Chronic systolic (congestive) heart failure: Secondary | ICD-10-CM | POA: Diagnosis not present

## 2022-04-30 ENCOUNTER — Telehealth: Payer: Self-pay

## 2022-04-30 NOTE — Progress Notes (Signed)
EPIC Encounter for ICM Monitoring  Patient Name: Benjamin Frost is a 81 y.o. male Date: 04/30/2022 Primary Care Physican: Leanna Sato, MD Primary Cardiologist: Kirke Corin Electrophysiologist: Allred Bi-V Pacing:  97%           12/04/2021 Weight: 198 lbs 03/19/2022 Weight: 198-200 lbs                                                            Attempted call to patient and unable to reach.  Transmission reviewed.    Corvue Thoracic impedance normal but was suggesting possible fluid accumulation starting 7/20-7/30.     Prescribed:  Furosemide 20 mg take 1 tablet daily as needed for weight gain of 2 lbs or greater (takes on average once a month).  Spironolactone 25 mg take 1 tablet daily.   Recommendations:  Unable to reach.     Follow-up plan: ICM clinic phone appointment on 06/03/2022.   91 day device clinic remote transmission 07/01/2022.    EP/Cardiology Office Visits:  05/29/2022 with Dr Ward Givens and Dr [name].    Copy of ICM check sent to [name].    3 month ICM trend: 04/29/2022.    12-14 Month ICM trend:     Karie Soda, RN 04/30/2022 3:30 PM

## 2022-04-30 NOTE — Telephone Encounter (Signed)
Remote ICM transmission received.  Attempted call to patient regarding ICM remote transmission and mail box is full. 

## 2022-05-01 NOTE — Progress Notes (Signed)
Remote ICD transmission.   

## 2022-05-28 NOTE — Progress Notes (Unsigned)
Electrophysiology Office Follow up Visit Note:    Date:  05/29/2022   ID:  Benjamin Frost, DOB [birth year], MRN [id number]  PCP:  Leanna Sato, MD  [facility] Cardiologist:  Lorine Bears, MD  [facility] Electrophysiologist:  [name], MD    Interval History:    Benjamin Frost is a 81 y.o. male who presents for a follow up visit. They were last seen in clinic November 21, 2021 by Otilio Saber.  The patient was previously followed by [name].  At the appointment with Mardelle Matte he reported daily diaphragmatic stimulation.  The patient has previously declined system revision.  No high-voltage therapies.  His CRT-D was implanted in 2018 for nonischemic cardiomyopathy.  He previously had a VF arrest during a dobutamine echocardiogram.  Today he is doing well.  No problems with his defibrillator.     Past Medical History:  Diagnosis Date   AICD (automatic cardioverter/defibrillator) present    a. St Jude, placed after VF arrest during a dobuatamine echo.    Aortic stenosis, severe    a. 11/04/17: s/p TAVR by [name] and [name]; b. 10/2018 Echo: 90mm Edwards Sapien bioprosthetic AoV. Mean grad (down from on prior study).   Chronic systolic heart failure (HCC)    a. 2015 EF 10-15%; b. 10/2018 Echo: EF 40-45%, nl RV fxn. Mildly dil LA.   COPD (chronic obstructive pulmonary disease) (HCC)    GERD (gastroesophageal reflux disease)    History of BPH    History of kidney stones    Hyperlipidemia    Hypertension    Left bundle branch block    NICM (nonischemic cardiomyopathy) (HCC)    a. 2015 EF 10-15%; b. 03/2014 cath: nl cors; c. S/p SJM CRT-D; d. 10/2018 Echo: EF 40-45%.   S/P TAVR (transcatheter aortic valve replacement)    a. 10/2017: s/p TAVR with an Edwards Sapien 3 THV (size 26 mm, model # 9600TFX, serial # [id number])   Sinus tachycardia     Past Surgical History:  Procedure Laterality Date   BIV ICD INSERTION CRT-D N/A 05/29/2017   SJM Quadra Assura  implanted by Dr Johney Frame for secondary prevention of sudden death   CARDIAC CATHETERIZATION  2012   [facility]   CARDIAC CATHETERIZATION     [facility]   CATARACT EXTRACTION, BILATERAL     LEFT AND RIGHT HEART CATHETERIZATION WITH CORONARY ANGIOGRAM N/A 03/23/2014   Procedure: LEFT AND RIGHT HEART CATHETERIZATION WITH CORONARY ANGIOGRAM;  Surgeon: [name], MD;  Location: [facility] CATH LAB;  Service: Cardiovascular;  Laterality: N/A;   LEG SURGERY Left    surgery d/t injury from nail gun   RIGHT/LEFT HEART CATH AND CORONARY ANGIOGRAPHY N/A 05/16/2017   Procedure: RIGHT/LEFT HEART CATH AND CORONARY ANGIOGRAPHY;  Surgeon: [name], MD;  Location: [facility] INVASIVE CV LAB;  Service: Cardiovascular;  Laterality: N/A;   TEE WITHOUT CARDIOVERSION N/A 11/04/2017   Procedure: TRANSESOPHAGEAL ECHOCARDIOGRAM (TEE);  Surgeon: [name], MD;  Location: [facility] OR;  Service: Open Heart Surgery;  Laterality: N/A;   TRANSCATHETER AORTIC VALVE REPLACEMENT, TRANSFEMORAL N/A 11/04/2017   Procedure: TRANSCATHETER AORTIC VALVE REPLACEMENT, TRANSFEMORAL;  Surgeon: [name], MD;  Location: [facility] OR;  Service: Open Heart Surgery;  Laterality: N/A;    Current Medications: Current Meds  Medication Sig   albuterol (VENTOLIN HFA) 108 (90 Base) MCG/ACT inhaler Inhale 2 puffs into the lungs every 6 (six) hours as needed for wheezing or shortness of breath.   amoxicillin (AMOXIL) 500 MG tablet  Take 2,000 mg (four 500 mg tablets) one hour prior to dental appointments.   aspirin 81 MG tablet Take 81 mg by mouth daily.   atorvastatin (LIPITOR) 40 MG tablet Take 40 mg by mouth daily.   carvedilol (COREG) 6.25 MG tablet Take 1 tablet (6.25 mg total) by mouth 2 (two) times daily.   cetirizine (ZYRTEC) 10 MG tablet Take 10 mg by mouth daily.   fluticasone (FLONASE) 50 MCG/ACT nasal spray Place 1 spray into both nostrils daily.   Fluticasone-Salmeterol (ADVAIR) 250-50 MCG/DOSE AEPB Inhale 1 puff into the lungs 2 (two) times daily.    furosemide (LASIX) 20 MG tablet Take 1 tablet (20 mg total) by mouth daily as needed. For weight gain greater than 2 lb.   losartan (COZAAR) 25 MG tablet Take 1 tablet (25 mg total) by mouth daily.   pantoprazole (PROTONIX) 40 MG tablet Take 1 tablet (40 mg total) by mouth daily.   spironolactone (ALDACTONE) 25 MG tablet Take 1 tablet (25 mg total) by mouth daily.   tamsulosin (FLOMAX) 0.4 MG CAPS capsule Take 0.4 mg by mouth daily.   tiotropium (SPIRIVA) 18 MCG inhalation capsule Place 18 mcg into inhaler and inhale daily.     Allergies:   Lisinopril, Dobutamine, Amlodipine, and Metoprolol   Social History   Socioeconomic History   Marital status: Married    Spouse name: Not on file   Number of children: Not on file   Years of education: Not on file   Highest education level: Not on file  Occupational History   Not on file  Tobacco Use   Smoking status: Former    Years: 0.00    Types: Cigarettes    Quit date: 56    Years since quitting: 31.7   Smokeless tobacco: Former    Types: Snuff   Tobacco comments:    quit 25 years ago  Vaping Use   Vaping Use: Never used  Substance and Sexual Activity   Alcohol use: Yes    Comment: occasional Beer    Drug use: No   Sexual activity: Not on file  Other Topics Concern   Not on file  Social History Narrative   Not on file   Social Determinants of Health   Financial Resource Strain: Not on file  Food Insecurity: Not on file  Transportation Needs: Not on file  Physical Activity: Not on file  Stress: Not on file  Social Connections: Not on file     Family History: The patient's family history includes Heart disease in his father; Heart failure in his father; Hyperlipidemia in his mother; Hypertension in his mother.  ROS:   Please see the history of present illness.    All other systems reviewed and are negative.  EKGs/Labs/Other Studies Reviewed:    The following studies were reviewed today:  May 29, 2022 in  clinic device interrogation personally reviewed Battery longevity 1.8 years Lead parameters stable with an elevated LV capture threshold and intermittent phrenic stimulation No high-voltage therapies BiV pacing 97% No changes made today  October 02, 2021 EKG reviewed and shows a biventricular paced rhythm.  QRS duration is approximately 120 ms.  Recent Labs: No results found for requested labs within last 365 days.  Recent Lipid Panel    Component Value Date/Time   CHOL 156 06/04/2018 1116   CHOL 117 02/28/2014 0442   TRIG 120 06/04/2018 1116   TRIG 65 02/28/2014 0442   HDL 57 06/04/2018 1116   HDL 44 02/28/2014  0442   CHOLHDL 2.7 06/04/2018 1116   VLDL 13 02/28/2014 0442   LDLCALC 75 06/04/2018 1116   LDLCALC 60 02/28/2014 0442    Physical Exam:    VS:  BP 120/78   Pulse 90   Ht 5\' 9"  (1.753 m)   Wt 208 lb 9.6 oz (94.6 kg)   SpO2 95%   BMI 30.80 kg/m     Wt Readings from Last 3 Encounters:  05/29/22 208 lb 9.6 oz (94.6 kg)  11/21/21 211 lb (95.7 kg)  10/02/21 211 lb (95.7 kg)     GEN:  Well nourished, well developed in no acute distress HEENT: Normal NECK: No JVD; No carotid bruits LYMPHATICS: No lymphadenopathy CARDIAC: RRR, no murmurs, rubs, gallops.  ICD pocket well-healed RESPIRATORY:  Clear to auscultation without rales, wheezing or rhonchi  ABDOMEN: Soft, non-tender, non-distended MUSCULOSKELETAL:  No edema; No deformity  SKIN: Warm and dry NEUROLOGIC:  Alert and oriented x 3 PSYCHIATRIC:  Normal affect        ASSESSMENT:    1. Chronic systolic heart failure (HCC)   2. NICM (nonischemic cardiomyopathy) (HCC)   3. Cardiac resynchronization therapy defibrillator (CRT-D) in place    PLAN:    In order of problems listed above:  #Chronic systolic heart failure #Nonischemic cardiomyopathy #CRT-D in situ NYHA class II.  Warm and dry on exam.  Continue current medical therapy.  CRT-D functioning appropriately although there is an elevated  capture threshold in the LV lead with diaphragmatic stimulation.  The patient has declined revision of his system in the past.  Continue remote monitoring.  Follow-up in 1 year or sooner as needed.  APP appointment okay.    Medication Adjustments/Labs and Tests Ordered: Current medicines are reviewed at length with the patient today.  Concerns regarding medicines are outlined above.  No orders of the defined types were placed in this encounter.  No orders of the defined types were placed in this encounter.    Signed, 11/30/21, MD, [facility], [facility] 05/29/2022 9:25 AM    Electrophysiology [facility]

## 2022-05-29 ENCOUNTER — Ambulatory Visit: Payer: Medicare Other | Admitting: Nurse Practitioner

## 2022-05-29 ENCOUNTER — Encounter: Payer: Self-pay | Admitting: Nurse Practitioner

## 2022-05-29 ENCOUNTER — Ambulatory Visit: Payer: Medicare Other | Attending: Cardiology | Admitting: Cardiology

## 2022-05-29 ENCOUNTER — Encounter: Payer: Self-pay | Admitting: Cardiology

## 2022-05-29 VITALS — BP 120/78 | HR 90 | Ht 69.0 in | Wt 208.6 lb

## 2022-05-29 VITALS — BP 120/78 | HR 66 | Ht 69.0 in | Wt 208.4 lb

## 2022-05-29 DIAGNOSIS — E782 Mixed hyperlipidemia: Secondary | ICD-10-CM

## 2022-05-29 DIAGNOSIS — Z95810 Presence of automatic (implantable) cardiac defibrillator: Secondary | ICD-10-CM

## 2022-05-29 DIAGNOSIS — I5022 Chronic systolic (congestive) heart failure: Secondary | ICD-10-CM

## 2022-05-29 DIAGNOSIS — Z952 Presence of prosthetic heart valve: Secondary | ICD-10-CM | POA: Diagnosis not present

## 2022-05-29 DIAGNOSIS — I10 Essential (primary) hypertension: Secondary | ICD-10-CM

## 2022-05-29 DIAGNOSIS — I428 Other cardiomyopathies: Secondary | ICD-10-CM | POA: Diagnosis not present

## 2022-05-29 NOTE — Patient Instructions (Addendum)
Medication Instructions:  - Your physician recommends that you continue on your current medications as directed. Please refer to the Current Medication list given to you today.  *If you need a refill on your cardiac medications before your next appointment, please call your pharmacy*   Lab Work: - none ordered  If you have labs (blood work) drawn today and your tests are completely normal, you will receive your results only by: MyChart Message (if you have MyChart) OR A paper copy in the mail If you have any lab test that is abnormal or we need to change your treatment, we will call you to review the results.   Testing/Procedures: - none ordered   Follow-Up: At [facility], you and your health needs are our priority.  As part of our continuing mission to provide you with exceptional heart care, we have created designated Provider Care Teams.  These Care Teams include your primary Cardiologist (physician) and Advanced Practice Providers (APPs -  Physician Assistants and Nurse Practitioners) who all work together to provide you with the care you need, when you need it.  We recommend signing up for the patient portal called "MyChart".  Sign up information is provided on this After Visit Summary.  MyChart is used to connect with patients for Virtual Visits (Telemedicine).  Patients are able to view lab/test results, encounter notes, upcoming appointments, etc.  Non-urgent messages can be sent to your provider as well.   To learn more about what you can do with MyChart, go to [email or web address].    Your next appointment:   6 month(s)  The format for your next appointment:   In Person  Provider:   You may see Muhammad Arida, MD or one of the following Advanced Practice Providers on your designated Care Team:   Christopher Berge, NP   Other Instructions N/a  Important Information About Sugar       

## 2022-05-29 NOTE — Progress Notes (Signed)
Office Visit    Patient Name: Benjamin Frost Date of Encounter: 05/29/2022  Primary Care Provider:  Leanna Sato, MD Primary Cardiologist:  Lorine Bears, MD  Chief Complaint    81 y/o ?  With a history of nonischemic cardiomyopathy/HFrEF status post 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  Past Medical History    Past Medical History:  Diagnosis Date   AICD (automatic cardioverter/defibrillator) present    a. St Jude, placed after VF arrest during a dobuatamine echo.    Aortic stenosis, severe    a. 11/04/17: s/p TAVR by [name] and [name]; b. 10/2018 Echo: 43mm Edwards Sapien bioprosthetic AoV. Mean grad (down from on prior study).   Chronic systolic heart failure (HCC)    a. 2015 EF 10-15%; b. 10/2018 Echo: EF 40-45%, nl RV fxn. Mildly dil LA.   COPD (chronic obstructive pulmonary disease) (HCC)    GERD (gastroesophageal reflux disease)    History of BPH    History of kidney stones    Hyperlipidemia    Hypertension    Left bundle branch block    NICM (nonischemic cardiomyopathy) (HCC)    a. 2015 EF 10-15%; b. 03/2014 cath: nl cors; c. S/p SJM CRT-D; d. 10/2018 Echo: EF 40-45%.   S/P TAVR (transcatheter aortic valve replacement)    a. 10/2017: s/p TAVR with an Edwards Sapien 3 THV (size 26 mm, model # 9600TFX, serial # [id number])   Sinus tachycardia    Past Surgical History:  Procedure Laterality Date   BIV ICD INSERTION CRT-D N/A 05/29/2017   SJM Quadra Assura implanted by Dr Johney Frame for secondary prevention of sudden death   CARDIAC CATHETERIZATION  2012   [facility]   CARDIAC CATHETERIZATION     [facility]   CATARACT EXTRACTION, BILATERAL     LEFT AND RIGHT HEART CATHETERIZATION WITH CORONARY ANGIOGRAM N/A 03/23/2014   Procedure: LEFT AND RIGHT HEART CATHETERIZATION WITH CORONARY ANGIOGRAM;  Surgeon:  [name], MD;  Location: [facility] CATH LAB;  Service: Cardiovascular;  Laterality: N/A;   LEG SURGERY Left    surgery d/t injury from nail gun   RIGHT/LEFT HEART CATH AND CORONARY ANGIOGRAPHY N/A 05/16/2017   Procedure: RIGHT/LEFT HEART CATH AND CORONARY ANGIOGRAPHY;  Surgeon: [name], MD;  Location: [facility] INVASIVE CV LAB;  Service: Cardiovascular;  Laterality: N/A;   TEE WITHOUT CARDIOVERSION N/A 11/04/2017   Procedure: TRANSESOPHAGEAL ECHOCARDIOGRAM (TEE);  Surgeon: [name], MD;  Location: [facility] OR;  Service: Open Heart Surgery;  Laterality: N/A;   TRANSCATHETER AORTIC VALVE REPLACEMENT, TRANSFEMORAL N/A 11/04/2017   Procedure: TRANSCATHETER AORTIC VALVE REPLACEMENT, TRANSFEMORAL;  Surgeon: [name], MD;  Location: [facility] OR;  Service: Open Heart Surgery;  Laterality: N/A;    Allergies  Allergies  Allergen Reactions   Lisinopril Hives and Other (See Comments)      Hives   Dobutamine     VF arrest during dobutamine echo   Amlodipine Other (See Comments)    UNSPECIFIED REACTION  unknown   Metoprolol Nausea And Vomiting and Other (See Comments)      Nausea and vomiting     History of Present Illness    81 year old male with above complex past medical history including nonischemic cardiomyopathy and HFrEF status post [facility] CRT-D, 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  He was diagnosed with nonischemic cardiomyopathy in July 2015, at which time his EF was 10 to 15%.  Catheterization showed only mildly elevated filling pressures with normal cardiac output and no significant pulmonary hypertension.  Coronary angiography showed no obstructive disease.  He subsequently underwent placement of CRT-D and has been followed closely by electrophysiology.  In the setting of progressive aortic stenosis, he was evaluated by our structural heart team and underwent successful TAVR in February 2019.   Postprocedure echo in March 2019 showed improvement in LV function to 40-45% with most recent echo in February 2020 showing an EF of 40 to 45% with a mean aortic valve gradient of 15 mmHg.  Benjamin Frost was last seen in cardiology clinic in January of this year at which time he was doing well.  Since then, he reports chronic, stable dyspnea on exertion which he believes is secondary to his COPD.  He does not experience wheezing and will often use an albuterol inhaler with improvement in symptoms.  He does not experience chest pain and denies palpitations, PND, orthopnea, dizziness, syncope, edema, or early satiety.  He only uses Lasix about once a month.  Home Medications    Current Outpatient Medications  Medication Sig Dispense Refill   albuterol (VENTOLIN HFA) 108 (90 Base) MCG/ACT inhaler Inhale 2 puffs into the lungs every 6 (six) hours as needed for wheezing or shortness of breath.     amoxicillin (AMOXIL) 500 MG tablet Take 2,000 mg (four 500 mg tablets) one hour prior to dental appointments. 4 tablet 6   aspirin 81 MG tablet Take 81 mg by mouth daily.     atorvastatin (LIPITOR) 40 MG tablet Take 40 mg by mouth daily.     carvedilol (COREG) 6.25 MG tablet Take 1 tablet (6.25 mg total) by mouth 2 (two) times daily. 180 tablet 1   cetirizine (ZYRTEC) 10 MG tablet Take 10 mg by mouth daily.     fluticasone (FLONASE) 50 MCG/ACT nasal spray Place 1 spray into both nostrils daily.     Fluticasone-Salmeterol (ADVAIR) 250-50 MCG/DOSE AEPB Inhale 1 puff into the lungs 2 (two) times daily.     furosemide (LASIX) 20 MG tablet Take 1 tablet (20 mg total) by mouth daily as needed. For weight gain greater than 2 lb. 30 tablet 0   losartan (COZAAR) 25 MG tablet Take 1 tablet (25 mg total) by mouth daily. 30 tablet 3   pantoprazole (PROTONIX) 40 MG tablet Take 1 tablet (40 mg total) by mouth daily. 30 tablet 0   spironolactone (ALDACTONE) 25 MG tablet Take 1 tablet (25 mg total) by mouth daily. 30 tablet 0    tamsulosin (FLOMAX) 0.4 MG CAPS capsule Take 0.4 mg by mouth daily.     tiotropium (SPIRIVA) 18 MCG inhalation capsule Place 18 mcg into inhaler and inhale daily.     No current facility-administered medications for this visit.     Review of Systems    Chronic, stable dyspnea on exertion associated with wheezing.  He denies chest pain, palpitations, PND, orthopnea, dizziness, syncope, edema, or early satiety.  All other systems reviewed and are otherwise negative except as noted above.    Physical Exam    VS:  BP 120/78 (BP Location: Left Arm, Patient Position: Sitting, Cuff Size: Normal)   Pulse 66   Ht 5\' 9"  (1.753 m)   Wt 208 lb 6 oz (94.5 kg)   SpO2 95%   BMI 30.77 kg/m  , BMI Body  mass index is 30.77 kg/m.     GEN: Well nourished, well developed, in no acute distress. HEENT: normal. Neck: Supple, no JVD, carotid bruits, or masses. Cardiac: RRR, no murmurs, rubs, or gallops. No clubbing, cyanosis, edema.  Radials/PT 2+ and equal bilaterally.  Respiratory:  Respirations regular and unlabored, scattered rhonchi. GI: Soft, nontender, nondistended, BS + x 4. MS: no deformity or atrophy. Skin: warm and dry, no rash. Neuro:  Strength and sensation are intact. Psych: Normal affect.  Accessory Clinical Findings    ECG personally reviewed by me today -a sensed V paced, 66- no acute changes.  Lab Results  Component Value Date   WBC 6.1 01/28/2020   HGB 14.5 01/28/2020   HCT 43.2 01/28/2020   MCV 95 01/28/2020   PLT 145 (L) 01/28/2020   Lab Results  Component Value Date   CREATININE 0.73 (L) 01/28/2020   BUN 12 01/28/2020   NA 139 01/28/2020   K 4.8 01/28/2020   CL 101 01/28/2020   CO2 27 01/28/2020   Lab Results  Component Value Date   ALT 10 06/04/2018   AST 14 06/04/2018   ALKPHOS 50 06/04/2018   BILITOT 0.8 06/04/2018   Lab Results  Component Value Date   CHOL 156 06/04/2018   HDL 57 06/04/2018   LDLCALC 75 06/04/2018   TRIG 120 06/04/2018   CHOLHDL  2.7 06/04/2018    Lab Results  Component Value Date   HGBA1C 5.4 10/31/2017    Assessment & Plan    1.  Chronic HFrEF/nonischemic cardiomyopathy: Normal coronary arteries by catheterization in 2015.  EF 40 to 45% by echo in April 2020.  He is status post CRT-D.  He has been feeling well and is euvolemic on examination today.  He has chronic dyspnea on exertion in the setting of COPD, which is often associated with wheezing and improved with albuterol inhaler.  He only uses Lasix about once a month.  He remains on beta-blocker, ARB, and spironolactone therapy.  2.  Essential hypertension: Blood pressure stable on beta-blocker and ARB therapy.  3.  Hyperlipidemia: Historically managed by his primary care provider.  He remains on atorvastatin therapy.  He is overdue for lab work and I offered to check today however, he prefers to have performed at the [facility].  4.  Status post TAVR: Normal functioning prosthesis by echo in 2020.  5.  COPD: Chronic, stable dyspnea.  Scattered rhonchi on examination today without wheezing.  He remains on inhaler therapy.  6.  Disposition: Follow-up in 6 months or sooner if necessary.  Murray Hodgkins, NP 05/29/2022, 12:41 PM

## 2022-05-29 NOTE — Patient Instructions (Signed)
Medication Instructions:  none *If you need a refill on your cardiac medications before your next appointment, please call your pharmacy*   Lab Work: none If you have labs (blood work) drawn today and your tests are completely normal, you will receive your results only by: MyChart Message (if you have MyChart) OR A paper copy in the mail If you have any lab test that is abnormal or we need to change your treatment, we will call you to review the results.   Testing/Procedures: non   Follow-Up: At [facility], you and your health needs are our priority.  As part of our continuing mission to provide you with exceptional heart care, we have created designated Provider Care Teams.  These Care Teams include your primary Cardiologist (physician) and Advanced Practice Providers (APPs -  Physician Assistants and Nurse Practitioners) who all work together to provide you with the care you need, when you need it.  We recommend signing up for the patient portal called "MyChart".  Sign up information is provided on this After Visit Summary.  MyChart is used to connect with patients for Virtual Visits (Telemedicine).  Patients are able to view lab/test results, encounter notes, upcoming appointments, etc.  Non-urgent messages can be sent to your provider as well.   To learn more about what you can do with MyChart, go to [email or web address].    Your next appointment:   1 year(s)  The format for your next appointment:   In Person  Provider:   You will see one of the following Advanced Practice Providers on your designated Care Team:   Nicolasa Ducking, NP Eula Listen, PA-C Cadence Fransico Michael, PA-C Charlsie Quest, NP      Other Instructions none  Important Information About Sugar

## 2022-06-03 ENCOUNTER — Ambulatory Visit (INDEPENDENT_AMBULATORY_CARE_PROVIDER_SITE_OTHER): Payer: Medicare Other

## 2022-06-03 DIAGNOSIS — I5022 Chronic systolic (congestive) heart failure: Secondary | ICD-10-CM | POA: Diagnosis not present

## 2022-06-03 DIAGNOSIS — Z95810 Presence of automatic (implantable) cardiac defibrillator: Secondary | ICD-10-CM

## 2022-06-05 ENCOUNTER — Telehealth: Payer: Self-pay

## 2022-06-05 NOTE — Telephone Encounter (Signed)
Remote ICM transmission received.  Attempted call to patient regarding ICM remote transmission and mail box is full. 

## 2022-06-05 NOTE — Progress Notes (Signed)
EPIC Encounter for ICM Monitoring  Patient Name: Benjamin Frost is a 81 y.o. male Date: 06/05/2022 Primary Care Physican: Leanna Sato, MD Primary Cardiologist: Kirke Corin Electrophysiologist: Allred Bi-V Pacing:  97%           12/04/2021 Weight: 198 lbs 03/19/2022 Weight: 198-200 lbs                                                            Attempted call to patient and unable to reach.  Left message to return call. Transmission reviewed.    Corvue Thoracic impedance normal but was suggesting possible fluid accumulation starting 8/28-9/2 followed by possible dryness 9/3-9/7.     Prescribed:  Furosemide 20 mg take 1 tablet daily as needed for weight gain of 2 lbs or greater (takes on average once a month).  Spironolactone 25 mg take 1 tablet daily.   Recommendations:  Unable to reach.      Follow-up plan: ICM clinic phone appointment on 07/08/2022.   91 day device clinic remote transmission 07/01/2022.    EP/Cardiology Office Visits:  11/28/2022 with Dr Kirke Corin.  Recall 05/24/2023 with Dr [name].    Copy of ICM check sent to [name].   3 month ICM trend: 06/03/2022.    12-14 Month ICM trend:     Karie Soda, RN 06/05/2022 8:58 AM

## 2022-07-01 ENCOUNTER — Ambulatory Visit (INDEPENDENT_AMBULATORY_CARE_PROVIDER_SITE_OTHER): Payer: Medicare Other

## 2022-07-01 DIAGNOSIS — I5022 Chronic systolic (congestive) heart failure: Secondary | ICD-10-CM

## 2022-07-02 LAB — CUP PACEART REMOTE DEVICE CHECK
Battery Remaining Longevity: 20 mo
Battery Remaining Percentage: 28 %
Battery Voltage: 2.86 V
Brady Statistic AP VP Percent: 1.5 %
Brady Statistic AP VS Percent: 1 %
Brady Statistic AS VP Percent: 97 %
Brady Statistic AS VS Percent: 1 %
Brady Statistic RA Percent Paced: 1 %
Date Time Interrogation Session: 20231009040014
HighPow Impedance: 74 Ohm
HighPow Impedance: 74 Ohm
Implantable Lead Implant Date: 20180906
Implantable Lead Implant Date: 20180906
Implantable Lead Implant Date: 20180906
Implantable Lead Location: 753858
Implantable Lead Location: 753859
Implantable Lead Location: 753860
Implantable Pulse Generator Implant Date: 20180906
Lead Channel Impedance Value: 1175 Ohm
Lead Channel Impedance Value: 480 Ohm
Lead Channel Impedance Value: 490 Ohm
Lead Channel Pacing Threshold Amplitude: 0.5 V
Lead Channel Pacing Threshold Amplitude: 0.5 V
Lead Channel Pacing Threshold Amplitude: 2.5 V
Lead Channel Pacing Threshold Pulse Width: 0.4 ms
Lead Channel Pacing Threshold Pulse Width: 0.5 ms
Lead Channel Pacing Threshold Pulse Width: 1 ms
Lead Channel Sensing Intrinsic Amplitude: 11.4 mV
Lead Channel Sensing Intrinsic Amplitude: 3.1 mV
Lead Channel Setting Pacing Amplitude: 1.5 V
Lead Channel Setting Pacing Amplitude: 2 V
Lead Channel Setting Pacing Amplitude: 2.75 V
Lead Channel Setting Pacing Pulse Width: 0.5 ms
Lead Channel Setting Pacing Pulse Width: 1 ms
Lead Channel Setting Sensing Sensitivity: 0.5 mV
Pulse Gen Serial Number: 7421078

## 2022-07-08 ENCOUNTER — Ambulatory Visit (INDEPENDENT_AMBULATORY_CARE_PROVIDER_SITE_OTHER): Payer: Medicare Other

## 2022-07-08 DIAGNOSIS — Z95810 Presence of automatic (implantable) cardiac defibrillator: Secondary | ICD-10-CM | POA: Diagnosis not present

## 2022-07-08 DIAGNOSIS — I5022 Chronic systolic (congestive) heart failure: Secondary | ICD-10-CM | POA: Diagnosis not present

## 2022-07-12 NOTE — Progress Notes (Signed)
EPIC Encounter for ICM Monitoring  Patient Name: Daimon Kean is a 81 y.o. male Date: 07/12/2022 Primary Care Physican: Marguerita Merles, MD Primary Cardiologist: Fletcher Anon Electrophysiologist: Allred Bi-V Pacing:  98%           12/04/2021 Weight: 198 lbs 03/19/2022 Weight: 198-200 lbs                                                            Spoke with patient and heart failure questions reviewed.  Transmission results reviewed.  Pt asymptomatic for fluid accumulation.  Reports feeling well at this time and voices no complaints.     Corvue Thoracic impedance suggesting normal fluid levels.     Prescribed:  Furosemide 20 mg take 1 tablet daily as needed for weight gain of 2 lbs or greater (takes on average once a month).  Spironolactone 25 mg take 1 tablet daily.   Recommendations:  No changes and encouraged to call if experiencing any fluid symptoms.   Follow-up plan: ICM clinic phone appointment on 08/12/2022.   91 day device clinic remote transmission 10/12/2022.    EP/Cardiology Office Visits:  11/28/2022 with Dr Fletcher Anon.  Recall 05/24/2023 with Dr Quentin Ore.    Copy of ICM check sent to Dr. Quentin Ore.   3 month ICM trend: 07/08/2022.    12-14 Month ICM trend:     Rosalene Billings, RN 07/12/2022 10:42 AM

## 2022-07-12 NOTE — Progress Notes (Signed)
Remote ICD transmission.   

## 2022-08-12 ENCOUNTER — Ambulatory Visit (INDEPENDENT_AMBULATORY_CARE_PROVIDER_SITE_OTHER): Payer: Medicare Other

## 2022-08-12 DIAGNOSIS — Z95810 Presence of automatic (implantable) cardiac defibrillator: Secondary | ICD-10-CM | POA: Diagnosis not present

## 2022-08-12 DIAGNOSIS — I5022 Chronic systolic (congestive) heart failure: Secondary | ICD-10-CM | POA: Diagnosis not present

## 2022-08-12 NOTE — Progress Notes (Signed)
EPIC Encounter for ICM Monitoring  Patient Name: Benjamin Frost is a 81 y.o. male Date: 08/12/2022 Primary Care Physican: Leanna Sato, MD Primary Cardiologist: Benjamin Frost Electrophysiologist: Benjamin Frost Pacing:  98%           12/04/2021 Weight: 198 lbs 03/19/2022 Weight: 198-200 lbs 08/12/2022 Weight: 199-200 lbs                                                            Spoke with patient and heart failure questions reviewed.  Transmission results reviewed.  Pt reports dry cough which is usually sign of fluid for him.   Corvue Thoracic impedance suggesting possible fluid accumulation starting 11/18.     Prescribed:  Furosemide 20 mg take 1 tablet daily as needed for weight gain of 2 lbs or greater (takes on average once a month).  Spironolactone 25 mg take 1 tablet daily.   Recommendations:   He will take PRN Furosemide 1 tablet for 1-2 days.     Follow-up plan: ICM clinic phone appointment on 08/26/2022 to recheck fluid levels.   91 day device clinic remote transmission 10/12/2022.    EP/Cardiology Office Visits:  11/28/2022 with Dr Benjamin Frost.  Recall 05/24/2023 with Dr [name].    Copy of ICM check sent to [name].   3 month ICM trend: 08/12/2022.    12-14 Month ICM trend:     Karie Soda, RN 08/12/2022 4:56 PM

## 2022-08-19 ENCOUNTER — Ambulatory Visit (INDEPENDENT_AMBULATORY_CARE_PROVIDER_SITE_OTHER): Payer: Medicare Other

## 2022-08-19 DIAGNOSIS — I5022 Chronic systolic (congestive) heart failure: Secondary | ICD-10-CM

## 2022-08-19 DIAGNOSIS — Z95810 Presence of automatic (implantable) cardiac defibrillator: Secondary | ICD-10-CM

## 2022-08-19 NOTE — Progress Notes (Unsigned)
EPIC Encounter for ICM Monitoring  Patient Name: Benjamin Frost is a 81 y.o. male Date: 08/19/2022 Primary Care Physican: Leanna Sato, MD Primary Cardiologist: Kirke Corin Electrophysiologist: Townsend Roger Pacing:  98%           12/04/2021 Weight: 198 lbs 03/19/2022 Weight: 198-200 lbs 08/12/2022 Weight: 199-200 lbs                                                            Spoke with patient and heart failure questions reviewed.  Transmission results reviewed.    He currently has COVID.    Corvue Thoracic impedance suggesting fluid levels returned to normal after taking PRN Furosemide x 2 days.      Prescribed:  Furosemide 20 mg take 1 tablet daily as needed for weight gain of 2 lbs or greater (takes on average once a month).  Spironolactone 25 mg take 1 tablet daily.   Recommendations:  No changes and encouraged to call if experiencing any fluid symptoms.   Follow-up plan: ICM clinic phone appointment on 10/01/2022.   91 day device clinic remote transmission 09/30/2022.    EP/Cardiology Office Visits:  11/28/2022 with Dr Kirke Corin.  Recall 05/24/2023 with Dr [name].    Copy of ICM check sent to [name].    3 month ICM trend: 08/19/2022.    12-14 Month ICM trend:     Karie Soda, RN 08/19/2022 8:07 AM

## 2022-09-30 ENCOUNTER — Ambulatory Visit (INDEPENDENT_AMBULATORY_CARE_PROVIDER_SITE_OTHER): Payer: Medicare Other

## 2022-09-30 DIAGNOSIS — I5022 Chronic systolic (congestive) heart failure: Secondary | ICD-10-CM

## 2022-09-30 DIAGNOSIS — I428 Other cardiomyopathies: Secondary | ICD-10-CM | POA: Diagnosis not present

## 2022-10-01 ENCOUNTER — Ambulatory Visit (INDEPENDENT_AMBULATORY_CARE_PROVIDER_SITE_OTHER): Payer: Medicare Other

## 2022-10-01 DIAGNOSIS — Z95810 Presence of automatic (implantable) cardiac defibrillator: Secondary | ICD-10-CM

## 2022-10-01 DIAGNOSIS — I5022 Chronic systolic (congestive) heart failure: Secondary | ICD-10-CM | POA: Diagnosis not present

## 2022-10-01 LAB — CUP PACEART REMOTE DEVICE CHECK
Battery Remaining Longevity: 20 mo
Battery Remaining Percentage: 28 %
Battery Voltage: 2.86 V
Brady Statistic AP VP Percent: 1.6 %
Brady Statistic AP VS Percent: 1 %
Brady Statistic AS VP Percent: 96 %
Brady Statistic AS VS Percent: 1.2 %
Brady Statistic RA Percent Paced: 1 %
Date Time Interrogation Session: 20240108045500
HighPow Impedance: 79 Ohm
HighPow Impedance: 79 Ohm
Implantable Lead Connection Status: 753985
Implantable Lead Connection Status: 753985
Implantable Lead Connection Status: 753985
Implantable Lead Implant Date: 20180906
Implantable Lead Implant Date: 20180906
Implantable Lead Implant Date: 20180906
Implantable Lead Location: 753858
Implantable Lead Location: 753859
Implantable Lead Location: 753860
Implantable Pulse Generator Implant Date: 20180906
Lead Channel Impedance Value: 1175 Ohm
Lead Channel Impedance Value: 510 Ohm
Lead Channel Impedance Value: 530 Ohm
Lead Channel Pacing Threshold Amplitude: 0.5 V
Lead Channel Pacing Threshold Amplitude: 0.5 V
Lead Channel Pacing Threshold Amplitude: 2.375 V
Lead Channel Pacing Threshold Pulse Width: 0.4 ms
Lead Channel Pacing Threshold Pulse Width: 0.5 ms
Lead Channel Pacing Threshold Pulse Width: 1 ms
Lead Channel Sensing Intrinsic Amplitude: 3.2 mV
Lead Channel Sensing Intrinsic Amplitude: 9.9 mV
Lead Channel Setting Pacing Amplitude: 1.5 V
Lead Channel Setting Pacing Amplitude: 2 V
Lead Channel Setting Pacing Amplitude: 2.625
Lead Channel Setting Pacing Pulse Width: 0.5 ms
Lead Channel Setting Pacing Pulse Width: 1 ms
Lead Channel Setting Sensing Sensitivity: 0.5 mV
Pulse Gen Serial Number: 7421078

## 2022-10-01 NOTE — Progress Notes (Signed)
EPIC Encounter for ICM Monitoring  Patient Name: Benjamin Frost is a 82 y.o. male Date: 10/01/2022 Primary Care Physican: Marguerita Merles, MD Primary Cardiologist: Fletcher Anon Electrophysiologist: Marisa Sprinkles Pacing:  98%           12/04/2021 Weight: 198 lbs 03/19/2022 Weight: 198-200 lbs 08/12/2022 Weight: 199-200 lbs 10/01/2022 Weight: 196 lbs                                                            Spoke with patient and heart failure questions reviewed.  Transmission results reviewed.  Pt reports weight gain during decreased impedance 12/30-1/4 and took PRN Furosemide 1/5 and weight returned to normal.      Corvue Thoracic impedance suggesting possible dryness starting 1/5.   Impedance was suggesting possible fluid accumulation from 12/16-12/24 and 12/30-09/26/22.   Prescribed:  Furosemide 20 mg take 1 tablet daily as needed for weight gain of 2 lbs or greater (takes on average once a month).  Spironolactone 25 mg take 1 tablet daily.   Recommendations:  Discussed drinking enough fluids to stay hydrated.  Encouraged to drink 64 oz daily.     Follow-up plan: ICM clinic phone appointment on 10/08/2022 to recheck fluid levels.   91 day device clinic remote transmission 12/30/2022.    EP/Cardiology Office Visits:  11/28/2022 with Dr Fletcher Anon.  Recall 05/24/2023 with Dr Quentin Ore.    Copy of ICM check sent to Dr. Quentin Ore.    3 month ICM trend: 10/01/2022.    12-14 Month ICM trend:     Rosalene Billings, RN 10/01/2022 6:54 AM

## 2022-10-08 ENCOUNTER — Ambulatory Visit (INDEPENDENT_AMBULATORY_CARE_PROVIDER_SITE_OTHER): Payer: Medicare Other

## 2022-10-08 DIAGNOSIS — I5022 Chronic systolic (congestive) heart failure: Secondary | ICD-10-CM

## 2022-10-08 DIAGNOSIS — Z95810 Presence of automatic (implantable) cardiac defibrillator: Secondary | ICD-10-CM

## 2022-10-08 NOTE — Progress Notes (Signed)
EPIC Encounter for ICM Monitoring  Patient Name: Benjamin Frost is a 82 y.o. male Date: 10/08/2022 Primary Care Physican: Marguerita Merles, MD Primary Cardiologist: Fletcher Anon Electrophysiologist: Marisa Sprinkles Pacing:  98%           12/04/2021 Weight: 198 lbs 03/19/2022 Weight: 198-200 lbs 08/12/2022 Weight: 199-200 lbs 10/01/2022 Weight: 196 lbs                                                            Spoke with patient and heart failure questions reviewed.  Transmission results reviewed.  Pt asymptomatic for fluid accumulation.  He has been drinking more than recommended 64 oz fluid daily which may contribute to decreased impedance.       Corvue Thoracic impedance suggesting fluid levels changed from dryness to possible fluid accumulation starting 1/13.   Prescribed:  Furosemide 20 mg take 1 tablet daily as needed for weight gain of 2 lbs or greater (takes on average once a month).  Spironolactone 25 mg take 1 tablet daily.   Recommendations:  Advised to take 1 PRN Furosemide and limit fluid intake to 64 oz daily.   Follow-up plan: ICM clinic phone appointment on 10/15/2022 to recheck fluid levels.   91 day device clinic remote transmission 12/30/2022.    EP/Cardiology Office Visits:  11/28/2022 with Dr Fletcher Anon.  Recall 05/24/2023 with Dr Quentin Ore.    Copy of ICM check sent to Dr. Quentin Ore.     3 month ICM trend: 10/08/2022.    12-14 Month ICM trend:     Rosalene Billings, RN 10/08/2022 8:23 AM

## 2022-10-15 ENCOUNTER — Ambulatory Visit: Payer: Medicare Other | Attending: Cardiology

## 2022-10-15 DIAGNOSIS — Z95810 Presence of automatic (implantable) cardiac defibrillator: Secondary | ICD-10-CM

## 2022-10-15 DIAGNOSIS — I5022 Chronic systolic (congestive) heart failure: Secondary | ICD-10-CM

## 2022-10-15 NOTE — Progress Notes (Signed)
EPIC Encounter for ICM Monitoring  Patient Name: Benjamin Frost is a 82 y.o. male Date: 10/15/2022 Primary Care Physican: Marguerita Merles, MD Primary Cardiologist: Fletcher Anon Electrophysiologist: Marisa Sprinkles Pacing:  98%           12/04/2021 Weight: 198 lbs 03/19/2022 Weight: 198-200 lbs 08/12/2022 Weight: 199-200 lbs 10/01/2022 Weight: 196 lbs 10/15/2022 Weight: 195 lbs                                                            Spoke with patient and heart failure questions reviewed.  Transmission results reviewed.  Pt asymptomatic for fluid accumulation.  Reports feeling well at this time and voices no complaints.         Corvue Thoracic impedance suggesting fluid levels returned to normal.   Prescribed:  Furosemide 20 mg take 1 tablet daily as needed for weight gain of 2 lbs or greater (takes on average once a month).  Spironolactone 25 mg take 1 tablet daily.   Recommendations:  No changes and encouraged to call if experiencing any fluid symptoms.   Follow-up plan: ICM clinic phone appointment on 11/04/2022.   91 day device clinic remote transmission 12/30/2022.    EP/Cardiology Office Visits:  11/28/2022 with Dr Fletcher Anon.  Recall 05/24/2023 with Dr Quentin Ore.    Copy of ICM check sent to Dr. Quentin Ore.      3 month ICM trend: 10/15/2022.    12-14 Month ICM trend:     Rosalene Billings, RN 10/15/2022 8:14 AM

## 2022-11-04 ENCOUNTER — Ambulatory Visit: Payer: Medicare Other | Attending: Cardiology

## 2022-11-04 DIAGNOSIS — Z95810 Presence of automatic (implantable) cardiac defibrillator: Secondary | ICD-10-CM

## 2022-11-04 DIAGNOSIS — I5022 Chronic systolic (congestive) heart failure: Secondary | ICD-10-CM

## 2022-11-04 NOTE — Progress Notes (Signed)
Remote ICD transmission.   

## 2022-11-07 NOTE — Progress Notes (Signed)
EPIC Encounter for ICM Monitoring  Patient Name: Benjamin Frost is a 82 y.o. male Date: 11/07/2022 Primary Care Physican: Marguerita Merles, MD Primary Cardiologist: Fletcher Anon Electrophysiologist: Marisa Sprinkles Pacing:  98%           12/04/2021 Weight: 198 lbs 03/19/2022 Weight: 198-200 lbs 08/12/2022 Weight: 199-200 lbs 10/01/2022 Weight: 196 lbs 10/15/2022 Weight: 195 lbs 11/07/2022 Weight: 195 lbs                                                            Spoke with patient and heart failure questions reviewed.  Transmission results reviewed.  Pt asymptomatic for fluid accumulation.  Reports feeling well at this time and voices no complaints.    He has taken 1 PRN furosemide in the last 3 weeks.     Corvue Thoracic impedance normal but was suggesting possible fluid accumulation from 1/13-1/18, 1/27-2/2, 2/8-2/10.   Prescribed:  Furosemide 20 mg take 1 tablet daily as needed for weight gain of 2 lbs or greater (takes on average once a month).  Spironolactone 25 mg take 1 tablet daily.   Recommendations:  No changes and encouraged to call if experiencing any fluid symptoms.   Follow-up plan: ICM clinic phone appointment on 12/09/2022.   91 day device clinic remote transmission 12/30/2022.    EP/Cardiology Office Visits:  11/28/2022 with Dr Fletcher Anon.  Recall 05/24/2023 with Dr Quentin Ore.    Copy of ICM check sent to Dr. Quentin Ore.      3 month ICM trend: 11/04/2022.    12-14 Month ICM trend:     Rosalene Billings, RN 11/07/2022 9:41 AM

## 2022-11-28 ENCOUNTER — Ambulatory Visit: Payer: Medicare Other | Attending: Cardiovascular Disease | Admitting: Cardiovascular Disease

## 2022-11-28 ENCOUNTER — Encounter: Payer: Self-pay | Admitting: Cardiovascular Disease

## 2022-11-28 VITALS — BP 120/82 | HR 79 | Ht 69.0 in | Wt 209.0 lb

## 2022-11-28 DIAGNOSIS — I5022 Chronic systolic (congestive) heart failure: Secondary | ICD-10-CM | POA: Diagnosis not present

## 2022-11-28 DIAGNOSIS — Z952 Presence of prosthetic heart valve: Secondary | ICD-10-CM | POA: Diagnosis not present

## 2022-11-28 DIAGNOSIS — E785 Hyperlipidemia, unspecified: Secondary | ICD-10-CM | POA: Diagnosis not present

## 2022-11-28 DIAGNOSIS — Z95810 Presence of automatic (implantable) cardiac defibrillator: Secondary | ICD-10-CM

## 2022-11-28 NOTE — Progress Notes (Addendum)
Cardiology Office Note   Date:  11/28/2022   ID:  Benjamin Frost, DOB [birth year], MRN [id number]  PCP:  Marguerita Merles, MD  Cardiologist:   Kathlyn Sacramento, MD   Chief Complaint  Patient presents with   Follow-up    6 month F/U-No new cardiac concerns      History of Present Illness: Benjamin Frost is a 82 y.o. male who presents for a followup visit regarding chronic systolic heart failure with severely reduced LV systolic function, aortic stenosis and an underlying left bundle branch block. Previous ejection fraction was 10-15% in 2015. Right and  left cardiac catheterization in July, 2015 showed only mildly elevated filling pressures with normal cardiac output and no significant pulmonary hypertension. Coronary angiography showed no obstructive coronary artery disease. He had significant sinus tachycardia that improved significantly with Ivabradine.  He is status post ICD CRT placement.  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   Most recent echocardiogram in February 2020 showed an EF of 40 to 45%, mild biatrial enlargement, normal functioning TAVR prosthesis with mean gradient of 15 mmHg.  He has been doing extremely well with no reported chest pain.  He has mild exertional dyspnea with no significant leg edema.  He uses furosemide only as needed and has not required this very often.  Past Medical History:  Diagnosis Date   AICD (automatic cardioverter/defibrillator) present    a. St Jude, placed after VF arrest during a dobuatamine echo.    Aortic stenosis, severe    a. 11/04/17: s/p TAVR by Dr. Burt Knack and Dr. Cyndia Bent; b. 10/2018 Echo: 77m Edwards Sapien bioprosthetic AoV. Mean grad 117mg (down from 2159m on prior study).   Chronic systolic heart failure ([facility]  a. 2015 EF 10-15%; b. 10/2018 Echo: EF 40-45%, nl RV fxn. Mildly dil LA.   COPD (chronic obstructive pulmonary disease) (HCC)    GERD (gastroesophageal reflux disease)    History of BPH    History of  kidney stones    Hyperlipidemia    Hypertension    Left bundle branch block    N[facility]ischemic cardiomyopathy) [place]  a. 2015 EF 10-15%; b. 03/2014 cath: nl cors; c. S/p SJM CRT-D; d. 10/2018 Echo: EF 40-45%.   S/P TAVR (transcatheter aortic valve replacement)    a. 10/2017: s/p TAVR with an Edwards Sapien 3 THV (size 26 mm, model # 9600TFX, serial # [id number] Sinus tachycardia     Past Surgical History:  Procedure Laterality Date   BIV ICD INSERTION CRT-D N/A 05/29/2017   SJM Quadra Assura implanted by Dr AllRayann Hemanr secondary prevention of sudden death   [facility]  [facility]     LEFT AND RIGHT HEART CATHETERIZATION WITH CORONARY ANGIOGRAM N/A 03/23/2014   Procedure: LEFT AND RIGHT HEART CATHETERIZATION WITH CORONARY ANGIOGRAM;  Surgeon: MuhWellington HampshireD;  [facility];  Service: Cardiovascular;  Laterality: N/A;   LEG SURGERY Left    surgery d/t injury from nail gun   RIGHT/LEFT HEART CATH AND CORONARY ANGIOGRAPHY N/A 05/16/2017   Procedure: RIGHT/LEFT HEART CATH AND CORONARY ANGIOGRAPHY;  Surgeon: EndNelva BushD;  [facility];  Service: Cardiovascular;  Laterality: N/A;   TEE WITHOUT CARDIOVERSION N/A 11/04/2017   Procedure: TRANSESOPHAGEAL ECHOCARDIOGRAM (TEE);  Surgeon: CooSherren MochaD;  [facility] [place] [facility];  Laterality: N/A;   TRANSCATHETER AORTIC VALVE REPLACEMENT, TRANSFEMORAL N/A 11/04/2017   Procedure: TRANSCATHETER AORTIC VALVE REPLACEMENT, TRANSFEMORAL;  Surgeon: Sherren Mocha, MD;  Location: [facility];  Service: Open Heart Surgery;  Laterality: N/A;     Current Outpatient Medications  Medication Sig Dispense Refill   albuterol (VENTOLIN HFA) 108 (90 Base) MCG/ACT inhaler Inhale 2 puffs into the lungs every 6 (six) hours as needed for wheezing or shortness of breath.     amoxicillin (AMOXIL) 500 MG tablet Take 2,000 mg (four 500 mg  tablets) one hour prior to dental appointments. 4 tablet 6   aspirin 81 MG tablet Take 81 mg by mouth daily.     atorvastatin (LIPITOR) 40 MG tablet Take 40 mg by mouth daily.     carvedilol (COREG) 6.25 MG tablet Take 1 tablet (6.25 mg total) by mouth 2 (two) times daily. 180 tablet 1   cetirizine (ZYRTEC) 10 MG tablet Take 10 mg by mouth daily.     fluticasone (FLONASE) 50 MCG/ACT nasal spray Place 1 spray into both nostrils daily.     Fluticasone-Salmeterol (ADVAIR) 250-50 MCG/DOSE AEPB Inhale 1 puff into the lungs 2 (two) times daily.     furosemide (LASIX) 20 MG tablet Take 1 tablet (20 mg total) by mouth daily as needed. For weight gain greater than 2 lb. 30 tablet 0   losartan (COZAAR) 25 MG tablet Take 1 tablet (25 mg total) by mouth daily. 30 tablet 3   pantoprazole (PROTONIX) 40 MG tablet Take 1 tablet (40 mg total) by mouth daily. 30 tablet 0   spironolactone (ALDACTONE) 25 MG tablet Take 1 tablet (25 mg total) by mouth daily. 30 tablet 0   tamsulosin (FLOMAX) 0.4 MG CAPS capsule Take 0.4 mg by mouth daily.     tiotropium (SPIRIVA) 18 MCG inhalation capsule Place 18 mcg into inhaler and inhale daily.     No current facility-administered medications for this visit.    Allergies:   Lisinopril, Dobutamine, Amlodipine, and Metoprolol    Social History:  The patient  reports that he quit smoking about 32 years ago. His smoking use included cigarettes. He has quit using smokeless tobacco.  His smokeless tobacco use included snuff. He reports current alcohol use. He reports that he does not use drugs.   Family History:  The patient's family history includes Heart disease in his father; Heart failure in his father; Hyperlipidemia in his mother; Hypertension in his mother.    ROS:  Please see the history of present illness.   Otherwise, review of systems are positive for none.   All other systems are reviewed and negative.    PHYSICAL EXAM: VS:  BP 120/82 (BP Location: Left Arm,  Patient Position: Sitting, Cuff Size: Normal)   Pulse 79   Ht '5\' 9"'$  (1.753 m)   Wt 209 lb (94.8 kg)   SpO2 93%   BMI 30.86 kg/m  , BMI Body mass index is 30.86 kg/m. GEN: Well nourished, well developed, in no acute distress  HEENT: normal  Neck: no JVD, carotid bruits, or masses Cardiac: RRR; no  rubs, or gallops,no edema . There is 1/6 systolic murmur in the aortic area . Respiratory: Clear to auscultation, normal work of breathing GI: soft, nontender, nondistended, + BS MS: no deformity or atrophy  Skin: warm and dry, no rash Neuro:  Strength and sensation are intact Psych: euthymic mood, full affect   EKG:  EKG is ordered today. The ekg ordered today demonstrates atrial sensed ventricular  paced rhythm .   Recent Labs: No results found for requested labs within last 365 days.    Lipid Panel    Component Value Date/Time   CHOL 156 06/04/2018 1116   CHOL 117 02/28/2014 0442   TRIG 120 06/04/2018 1116   TRIG 65 02/28/2014 0442   HDL 57 06/04/2018 1116   HDL 44 02/28/2014 0442   CHOLHDL 2.7 06/04/2018 1116   VLDL 13 02/28/2014 0442   LDLCALC 75 06/04/2018 1116   LDLCALC 60 02/28/2014 0442      Wt Readings from Last 3 Encounters:  11/28/22 209 lb (94.8 kg)  05/29/22 208 lb 6 oz (94.5 kg)  05/29/22 208 lb 9.6 oz (94.6 kg)       ASSESSMENT AND PLAN:  1.  Chronic systolic heart failure: Due to nonischemic cardiomyopathy.  He is currently New York Heart Association class II.  Most recent ejection fraction was 40 to 45%. Continue treatment with carvedilol, losartan and spironolactone.  He appears to be euvolemic without a diuretic.  He uses furosemide only as needed.   I reviewed his labs done last month which showed normal renal function with a creatinine of 0.86 and a potassium of 4.2. CBC was unremarkable except for mild thrombocytopenia at 135 which has been stable.  2.  Status post TAVR  for severe aortic stenosis: Stable.  Most recent echo showed normal  functioning prosthesis.  Continue aspirin.  3. Hyperlipidemia: Currently on atorvastatin 40 mg daily.  He had lipid profile done last month which showed an LDL of 84 and triglyceride of 61.  4.  Status post ICD CRT: Followed by the EP clinic.  EKG today shows normal functioning pacemaker.  5.  COPD: Seems to be stable.   Disposition:   FU with me in 6 months  Signed,  Kathlyn Sacramento, MD  11/28/2022 9:00 AM    [place]

## 2022-11-28 NOTE — Patient Instructions (Signed)
Medication Instructions:  No changes *If you need a refill on your cardiac medications before your next appointment, please call your pharmacy*   Lab Work: None ordered If you have labs (blood work) drawn today and your tests are completely normal, you will receive your results only by: [place] (if you have MyChart) OR A paper copy in the mail If you have any lab test that is abnormal or we need to change your treatment, we will call you to review the results.   Testing/Procedures: None ordered   Follow-Up: At [facility], you and your health needs are our priority.  As part of our continuing mission to provide you with exceptional heart care, we have created designated Provider Care Teams.  These Care Teams include your primary Cardiologist (physician) and Advanced Practice Providers (APPs -  Physician Assistants and Nurse Practitioners) who all work together to provide you with the care you need, when you need it.  We recommend signing up for the patient portal called "MyChart".  Sign up information is provided on this After Visit Summary.  MyChart is used to connect with patients for Virtual Visits (Telemedicine).  Patients are able to view lab/test results, encounter notes, upcoming appointments, etc.  Non-urgent messages can be sent to your provider as well.   To learn more about what you can do with MyChart, go to [email or web address].    Your next appointment:   6 month(s)  Provider:   You may see Kathlyn Sacramento, MD or one of the following Advanced Practice Providers on your designated Care Team:   Murray Hodgkins, NP Christell Faith, PA-C Cadence Kathlen Mody, PA-C Gerrie Nordmann, NP

## 2022-12-09 ENCOUNTER — Ambulatory Visit: Payer: Medicare Other | Attending: Cardiology

## 2022-12-09 DIAGNOSIS — Z95810 Presence of automatic (implantable) cardiac defibrillator: Secondary | ICD-10-CM

## 2022-12-09 DIAGNOSIS — I5022 Chronic systolic (congestive) heart failure: Secondary | ICD-10-CM

## 2022-12-10 NOTE — Progress Notes (Signed)
EPIC Encounter for ICM Monitoring  Patient Name: Benjamin Frost is a 82 y.o. male Date: 12/10/2022 Primary Care Physican: Marguerita Merles, MD Primary Cardiologist: Fletcher Anon Electrophysiologist: Marisa Sprinkles Pacing:  98%           12/04/2021 Weight: 198 lbs 03/19/2022 Weight: 198-200 lbs 08/12/2022 Weight: 199-200 lbs 10/01/2022 Weight: 196 lbs 10/15/2022 Weight: 195 lbs 11/07/2022 Weight: 195 lbs                                                            Spoke with patient and heart failure questions reviewed.  Transmission results reviewed.  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     Corvue Thoracic impedance suggesting possible fluid accumulation starting 3/14.   Prescribed:  Furosemide 20 mg take 1 tablet daily as needed for weight gain of 2 lbs or greater (takes on average once a month).  Spironolactone 25 mg take 1 tablet daily.   Recommendations:  No changes and encouraged to call if experiencing any fluid symptoms.   Follow-up plan: ICM clinic phone appointment on 12/16/2022 to recheck fluid levels   91 day device clinic remote transmission 12/30/2022.    EP/Cardiology Office Visits:  11/28/2022 with Dr Fletcher Anon.  Recall 05/24/2023 with Dr Quentin Ore.    Copy of ICM check sent to Dr. Quentin Ore.    3 month ICM trend: 12/09/2022.    12-14 Month ICM trend:     Rosalene Billings, RN 12/10/2022 2:30 PM

## 2022-12-16 ENCOUNTER — Ambulatory Visit: Payer: Medicare Other | Attending: Cardiology

## 2022-12-16 DIAGNOSIS — I5022 Chronic systolic (congestive) heart failure: Secondary | ICD-10-CM

## 2022-12-16 DIAGNOSIS — Z95810 Presence of automatic (implantable) cardiac defibrillator: Secondary | ICD-10-CM

## 2022-12-17 NOTE — Progress Notes (Signed)
EPIC Encounter for ICM Monitoring  Patient Name: Benjamin Frost is a 82 y.o. male Date: 12/17/2022 Primary Care Physican: Marguerita Merles, MD Primary Cardiologist: Fletcher Anon Electrophysiologist: Marisa Sprinkles Pacing:  98%           12/04/2021 Weight: 198 lbs 03/19/2022 Weight: 198-200 lbs 08/12/2022 Weight: 199-200 lbs 10/01/2022 Weight: 196 lbs 10/15/2022 Weight: 195 lbs 11/07/2022 Weight: 195 lbs 12/17/2022 Weight: 198 - 200 lbs                                                            Spoke with patient and heart failure questions reviewed.  Transmission results reviewed.  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     Corvue Thoracic impedance suggesting fluid levels returned to normal after taking PRN Lasix.   Prescribed:  Furosemide 20 mg take 1 tablet daily as needed for weight gain of 2 lbs or greater (takes on average once a month).  Spironolactone 25 mg take 1 tablet daily.   Recommendations:  No changes and encouraged to call if experiencing any fluid symptoms.   Follow-up plan: ICM clinic phone appointment on 01/06/2023.   91 day device clinic remote transmission 12/30/2022.    EP/Cardiology Office Visits:    Recall 05/24/2023 with Dr Quentin Ore.    Copy of ICM check sent to Dr. Quentin Ore.     3 month ICM trend: 12/16/2022.    12-14 Month ICM trend:     Rosalene Billings, RN 12/17/2022 2:40 PM

## 2022-12-30 ENCOUNTER — Ambulatory Visit (INDEPENDENT_AMBULATORY_CARE_PROVIDER_SITE_OTHER): Payer: Medicare Other

## 2022-12-30 DIAGNOSIS — I5022 Chronic systolic (congestive) heart failure: Secondary | ICD-10-CM

## 2022-12-30 DIAGNOSIS — I428 Other cardiomyopathies: Secondary | ICD-10-CM

## 2022-12-30 LAB — CUP PACEART REMOTE DEVICE CHECK
Battery Remaining Longevity: 20 mo
Battery Remaining Percentage: 28 %
Battery Voltage: 2.84 V
Brady Statistic AP VP Percent: 1.3 %
Brady Statistic AP VS Percent: 1 %
Brady Statistic AS VP Percent: 97 %
Brady Statistic AS VS Percent: 1.1 %
Brady Statistic RA Percent Paced: 1 %
Date Time Interrogation Session: 20240408040015
HighPow Impedance: 73 Ohm
HighPow Impedance: 73 Ohm
Implantable Lead Connection Status: 753985
Implantable Lead Connection Status: 753985
Implantable Lead Connection Status: 753985
Implantable Lead Implant Date: 20180906
Implantable Lead Implant Date: 20180906
Implantable Lead Implant Date: 20180906
Implantable Lead Location: 753858
Implantable Lead Location: 753859
Implantable Lead Location: 753860
Implantable Pulse Generator Implant Date: 20180906
Lead Channel Impedance Value: 1150 Ohm
Lead Channel Impedance Value: 490 Ohm
Lead Channel Impedance Value: 510 Ohm
Lead Channel Pacing Threshold Amplitude: 0.5 V
Lead Channel Pacing Threshold Amplitude: 0.625 V
Lead Channel Pacing Threshold Amplitude: 2.625 V
Lead Channel Pacing Threshold Pulse Width: 0.4 ms
Lead Channel Pacing Threshold Pulse Width: 0.5 ms
Lead Channel Pacing Threshold Pulse Width: 1 ms
Lead Channel Sensing Intrinsic Amplitude: 3.2 mV
Lead Channel Sensing Intrinsic Amplitude: 6.5 mV
Lead Channel Setting Pacing Amplitude: 1.625
Lead Channel Setting Pacing Amplitude: 2 V
Lead Channel Setting Pacing Amplitude: 2.875
Lead Channel Setting Pacing Pulse Width: 0.5 ms
Lead Channel Setting Pacing Pulse Width: 1 ms
Lead Channel Setting Sensing Sensitivity: 0.5 mV
Pulse Gen Serial Number: 7421078

## 2023-01-06 ENCOUNTER — Ambulatory Visit: Payer: Medicare Other | Attending: Cardiology

## 2023-01-06 DIAGNOSIS — Z95810 Presence of automatic (implantable) cardiac defibrillator: Secondary | ICD-10-CM

## 2023-01-06 DIAGNOSIS — I5022 Chronic systolic (congestive) heart failure: Secondary | ICD-10-CM

## 2023-01-07 ENCOUNTER — Telehealth: Payer: Self-pay

## 2023-01-07 NOTE — Progress Notes (Unsigned)
EPIC Encounter for ICM Monitoring  Patient Name: Telesfor Turso is a 82 y.o. male Date: 01/07/2023 Primary Care Physican: Leanna Sato, MD Primary Cardiologist: Kirke Corin Electrophysiologist: Townsend Roger Pacing:  98%           10/01/2022 Weight: 196 lbs 10/15/2022 Weight: 195 lbs 11/07/2022 Weight: 195 lbs 12/17/2022 Weight: 198 - 200 lbs                                                            Attempted call to patient and unable to reach.  Transmission reviewed.    Corvue Thoracic impedance suggesting possible fluid accumulation from 3/31-4/9 and changed to possible dryness 4/13.   Prescribed:  Furosemide 20 mg take 1 tablet daily as needed for weight gain of 2 lbs or greater (takes on average once a month).  Spironolactone 25 mg take 1 tablet daily.   Recommendations:  Unable to reach.     Follow-up plan: ICM clinic phone appointment on 02/10/2023.   91 day device clinic remote transmission 03/31/2023.    EP/Cardiology Office Visits:    Recall 05/24/2023 with Dr [name].    Copy of ICM check sent to [name].    3 month ICM trend: 01/06/2023.    12-14 Month ICM trend:     Karie Soda, RN 01/07/2023 3:57 PM

## 2023-01-07 NOTE — Telephone Encounter (Signed)
Remote ICM transmission received.  Attempted call to patient on home and cell number regarding ICM remote transmission and no answer or voice mail option.

## 2023-01-08 NOTE — Progress Notes (Signed)
Spoke with patient and heart failure questions reviewed.  Transmission results reviewed.  Pt asymptomatic for fluid accumulation.  Reports feeling well at this time and voices no complaints.  No changes and encouraged to call if experiencing any fluid symptoms. 

## 2023-02-05 NOTE — Progress Notes (Signed)
Remote ICD transmission.   

## 2023-02-10 ENCOUNTER — Ambulatory Visit: Payer: Medicare Other | Attending: Cardiology

## 2023-02-10 DIAGNOSIS — I5022 Chronic systolic (congestive) heart failure: Secondary | ICD-10-CM | POA: Diagnosis not present

## 2023-02-10 DIAGNOSIS — Z95810 Presence of automatic (implantable) cardiac defibrillator: Secondary | ICD-10-CM | POA: Diagnosis not present

## 2023-02-11 ENCOUNTER — Telehealth: Payer: Self-pay

## 2023-02-11 NOTE — Telephone Encounter (Signed)
Remote ICM transmission received.  Attempted call to patient regarding ICM remote transmission and no answer on home or cell phone number.  Unable to leave message.

## 2023-02-11 NOTE — Progress Notes (Signed)
EPIC Encounter for ICM Monitoring  Patient Name: Benjamin Frost is a 82 y.o. male Date: 02/11/2023 Primary Care Physican: Leanna Sato, MD Primary Cardiologist: Kirke Corin Electrophysiologist: Townsend Roger Pacing:  98%           10/01/2022 Weight: 196 lbs 10/15/2022 Weight: 195 lbs 11/07/2022 Weight: 195 lbs 12/17/2022 Weight: 198 - 200 lbs                                                            Attempted call to patient and unable to reach.  Transmission reviewed.    Corvue Thoracic impedance suggesting some days with possible dryness within the last month.   Prescribed:  Furosemide 20 mg take 1 tablet daily as needed for weight gain of 2 lbs or greater (takes on average once a month).  Spironolactone 25 mg take 1 tablet daily.   Recommendations:  Unable to reach.     Follow-up plan: ICM clinic phone appointment on 03/17/2023.   91 day device clinic remote transmission 03/31/2023.    EP/Cardiology Office Visits:    Recall 05/24/2023 with Dr [name].    Copy of ICM check sent to [name].  3 month ICM trend: 02/10/2023.    12-14 Month ICM trend:     Karie Soda, RN 02/11/2023 1:17 PM

## 2023-02-11 NOTE — Progress Notes (Signed)
Spoke with patient and heart failure questions reviewed.  Transmission results reviewed.  Pt asymptomatic for fluid accumulation.  Reports feeling well at this time and voices no complaints.  Taken PRN Furosemide a couple of times in the last month. No changes and encouraged to call if experiencing any fluid symptoms.

## 2023-03-03 ENCOUNTER — Other Ambulatory Visit: Payer: Self-pay | Admitting: Cardiovascular Disease

## 2023-03-17 ENCOUNTER — Ambulatory Visit: Payer: Medicare Other | Attending: Cardiology

## 2023-03-17 DIAGNOSIS — I5022 Chronic systolic (congestive) heart failure: Secondary | ICD-10-CM | POA: Diagnosis not present

## 2023-03-17 DIAGNOSIS — Z95810 Presence of automatic (implantable) cardiac defibrillator: Secondary | ICD-10-CM

## 2023-03-19 NOTE — Progress Notes (Signed)
EPIC Encounter for ICM Monitoring  Patient Name: Benjamin Frost is a 82 y.o. male Date: 03/19/2023 Primary Care Physican: Leanna Sato, MD Primary Cardiologist: Kirke Corin Electrophysiologist: Townsend Roger Pacing:  98%           03/19/2023 Weight: 198 - 200 lbs                                                         Spoke with patient and heart failure questions reviewed.  Transmission results reviewed.  Pt asymptomatic for fluid accumulation.  Reports feeling well at this time and voices no complaints.  He did take PRN Furosemide during decreased impedance.   Corvue Thoracic impedance suggesting intermittent days with possible fluid accumulation within the last month.   Prescribed:  Furosemide 20 mg take 1 tablet daily as needed for weight gain of 2 lbs or greater (takes on average once a month).  Spironolactone 25 mg take 1 tablet daily.   Recommendations:  No changes and encouraged to call if experiencing any fluid symptoms.   Follow-up plan: ICM clinic phone appointment on 04/21/2023.   91 day device clinic remote transmission 03/31/2023.    EP/Cardiology Office Visits:    Recall 05/24/2023 with Dr [name].   Recall 05/27/2023 with Dr Kirke Corin.   Copy of ICM check sent to [name].  3 month ICM trend: 03/17/2023.    12-14 Month ICM trend:     Karie Soda, RN 03/19/2023 10:45 AM

## 2023-03-31 ENCOUNTER — Ambulatory Visit: Payer: Medicare Other

## 2023-03-31 DIAGNOSIS — I428 Other cardiomyopathies: Secondary | ICD-10-CM | POA: Diagnosis not present

## 2023-03-31 DIAGNOSIS — I5022 Chronic systolic (congestive) heart failure: Secondary | ICD-10-CM

## 2023-03-31 LAB — CUP PACEART REMOTE DEVICE CHECK
Battery Remaining Longevity: 18 mo
Battery Remaining Percentage: 25 %
Battery Voltage: 2.81 V
Brady Statistic AP VP Percent: 1.2 %
Brady Statistic AP VS Percent: 1 %
Brady Statistic AS VP Percent: 97 %
Brady Statistic AS VS Percent: 1.3 %
Brady Statistic RA Percent Paced: 1 %
Date Time Interrogation Session: 20240708040017
HighPow Impedance: 83 Ohm
HighPow Impedance: 83 Ohm
Implantable Lead Connection Status: 753985
Implantable Lead Connection Status: 753985
Implantable Lead Connection Status: 753985
Implantable Lead Implant Date: 20180906
Implantable Lead Implant Date: 20180906
Implantable Lead Implant Date: 20180906
Implantable Lead Location: 753858
Implantable Lead Location: 753859
Implantable Lead Location: 753860
Implantable Pulse Generator Implant Date: 20180906
Lead Channel Impedance Value: 1200 Ohm
Lead Channel Impedance Value: 530 Ohm
Lead Channel Impedance Value: 540 Ohm
Lead Channel Pacing Threshold Amplitude: 0.5 V
Lead Channel Pacing Threshold Amplitude: 0.625 V
Lead Channel Pacing Threshold Amplitude: 2.5 V
Lead Channel Pacing Threshold Pulse Width: 0.4 ms
Lead Channel Pacing Threshold Pulse Width: 0.5 ms
Lead Channel Pacing Threshold Pulse Width: 1 ms
Lead Channel Sensing Intrinsic Amplitude: 12 mV
Lead Channel Sensing Intrinsic Amplitude: 3.6 mV
Lead Channel Setting Pacing Amplitude: 1.625
Lead Channel Setting Pacing Amplitude: 2 V
Lead Channel Setting Pacing Amplitude: 2.75 V
Lead Channel Setting Pacing Pulse Width: 0.5 ms
Lead Channel Setting Pacing Pulse Width: 1 ms
Lead Channel Setting Sensing Sensitivity: 0.5 mV
Pulse Gen Serial Number: 7421078

## 2023-04-16 NOTE — Progress Notes (Signed)
Remote ICD transmission.   

## 2023-04-21 ENCOUNTER — Ambulatory Visit: Payer: Medicare Other | Attending: Cardiology

## 2023-04-21 DIAGNOSIS — I5022 Chronic systolic (congestive) heart failure: Secondary | ICD-10-CM | POA: Diagnosis not present

## 2023-04-21 DIAGNOSIS — Z95810 Presence of automatic (implantable) cardiac defibrillator: Secondary | ICD-10-CM

## 2023-04-23 NOTE — Progress Notes (Signed)
EPIC Encounter for ICM Monitoring  Patient Name: Benjamin Frost is a 82 y.o. male Date: 04/23/2023 Primary Care Physican: Leanna Sato, MD Primary Cardiologist: Kirke Corin Electrophysiologist: Townsend Roger Pacing:  98%           03/19/2023 Weight: 198 - 200 lbs                                                         Spoke with patient and heart failure questions reviewed.  Transmission results reviewed.  Pt asymptomatic for fluid accumulation.   He did take PRN Furosemide during decreased impedance.   Corvue Thoracic impedance suggesting normal fluid levels with the exception of possible fluid accumulation 7/20-7/26.   Prescribed:  Furosemide 20 mg take 1 tablet daily as needed for weight gain of 2 lbs or greater (takes on average once a month).  Spironolactone 25 mg take 1 tablet daily.   Recommendations:  No changes and encouraged to call if experiencing any fluid symptoms.   Follow-up plan: ICM clinic phone appointment on 05/27/2023.   91 day device clinic remote transmission 06/30/2023.    EP/Cardiology Office Visits:    Recall 05/24/2023 with Dr [name].   Recall 05/27/2023 with Dr Kirke Corin.   Copy of ICM check sent to [name].  3 month ICM trend: 04/21/2023.    12-14 Month ICM trend:     Karie Soda, RN 04/23/2023 1:31 PM

## 2023-05-27 ENCOUNTER — Ambulatory Visit (INDEPENDENT_AMBULATORY_CARE_PROVIDER_SITE_OTHER): Payer: Medicare Other

## 2023-05-27 ENCOUNTER — Encounter: Payer: Self-pay | Admitting: Cardiovascular Disease

## 2023-05-27 ENCOUNTER — Ambulatory Visit: Payer: Medicare Other | Attending: Cardiovascular Disease | Admitting: Cardiovascular Disease

## 2023-05-27 VITALS — BP 108/60 | HR 78 | Ht 70.0 in | Wt 212.1 lb

## 2023-05-27 DIAGNOSIS — I5022 Chronic systolic (congestive) heart failure: Secondary | ICD-10-CM

## 2023-05-27 DIAGNOSIS — I4901 Ventricular fibrillation: Secondary | ICD-10-CM | POA: Diagnosis not present

## 2023-05-27 DIAGNOSIS — I350 Nonrheumatic aortic (valve) stenosis: Secondary | ICD-10-CM

## 2023-05-27 DIAGNOSIS — I428 Other cardiomyopathies: Secondary | ICD-10-CM

## 2023-05-27 DIAGNOSIS — I447 Left bundle-branch block, unspecified: Secondary | ICD-10-CM | POA: Diagnosis not present

## 2023-05-27 DIAGNOSIS — Z95810 Presence of automatic (implantable) cardiac defibrillator: Secondary | ICD-10-CM | POA: Diagnosis not present

## 2023-05-27 DIAGNOSIS — E785 Hyperlipidemia, unspecified: Secondary | ICD-10-CM

## 2023-05-27 MED ORDER — DAPAGLIFLOZIN PROPANEDIOL 10 MG PO TABS: 10.0000 mg | ORAL_TABLET | Freq: Every day | ORAL | 3 refills | Status: DC

## 2023-05-27 NOTE — Patient Instructions (Addendum)
Medication Instructions:  Start Farxiga 10 mg daily   *If you need a refill on your cardiac medications before your next appointment, please call your pharmacy*   Lab Work: BMET in 1 week (take lab order with you to have completed)   If you have labs (blood work) drawn today and your tests are completely normal, you will receive your results only by: MyChart Message (if you have MyChart) OR A paper copy in the mail If you have any lab test that is abnormal or we need to change your treatment, we will call you to review the results.   Follow-Up: At [facility], you and your health needs are our priority.  As part of our continuing mission to provide you with exceptional heart care, we have created designated Provider Care Teams.  These Care Teams include your primary Cardiologist (physician) and Advanced Practice Providers (APPs -  Physician Assistants and Nurse Practitioners) who all work together to provide you with the care you need, when you need it.  We recommend signing up for the patient portal called "MyChart".  Sign up information is provided on this After Visit Summary.  MyChart is used to connect with patients for Virtual Visits (Telemedicine).  Patients are able to view lab/test results, encounter notes, upcoming appointments, etc.  Non-urgent messages can be sent to your provider as well.   To learn more about what you can do with MyChart, go to [email or web address].    Your next appointment:   6 month(s)  Provider:   Lorine Bears, MD

## 2023-05-27 NOTE — Progress Notes (Signed)
Cardiology Office Note   Date:  05/27/2023   ID:  Benjamin Frost, DOB [birth year], MRN [id number]  PCP:  Leanna Sato, MD  Cardiologist:   Lorine Bears, MD   Chief Complaint  Patient presents with   Follow-up    6 month f/u no complaints today. Meds reviewed verbally with pt.      History of Present Illness: Benjamin Frost is a 82 y.o. male who presents for a followup visit regarding chronic systolic heart failure with severely reduced LV systolic function, aortic stenosis and an underlying left bundle branch block. Previous ejection fraction was 10-15% in 2015. Right and  left cardiac catheterization in July, 2015 showed only mildly elevated filling pressures with normal cardiac output and no significant pulmonary hypertension. Coronary angiography showed no obstructive coronary artery disease. He had significant sinus tachycardia that improved significantly with Ivabradine.  He is status post ICD CRT placement.  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   Most recent echocardiogram in February 2020 showed an EF of 40 to 45%, mild biatrial enlargement, normal functioning TAVR prosthesis with mean gradient of 15 mmHg.  He has been doing extremely well with no reported chest pain.  He has mild exertional dyspnea with occasional lower extremity edema.  He does feel bloated and gained about 3 pounds last week.  Past Medical History:  Diagnosis Date   AICD (automatic cardioverter/defibrillator) present    a. St Jude, placed after VF arrest during a dobuatamine echo.    Aortic stenosis, severe    a. 11/04/17: s/p TAVR by [name] and [name]; b. 10/2018 Echo: 26mm Edwards Sapien bioprosthetic AoV. Mean grad (down from on prior study).   Chronic systolic heart failure (HCC)    a. 2015 EF 10-15%; b. 10/2018 Echo: EF 40-45%, nl RV fxn. Mildly dil LA.   COPD (chronic obstructive pulmonary disease) (HCC)    GERD (gastroesophageal reflux disease)    History of  BPH    History of kidney stones    Hyperlipidemia    Hypertension    Left bundle branch block    NICM (nonischemic cardiomyopathy) (HCC)    a. 2015 EF 10-15%; b. 03/2014 cath: nl cors; c. S/p SJM CRT-D; d. 10/2018 Echo: EF 40-45%.   S/P TAVR (transcatheter aortic valve replacement)    a. 10/2017: s/p TAVR with an Edwards Sapien 3 THV (size 26 mm, model # 9600TFX, serial # [id number])   Sinus tachycardia     Past Surgical History:  Procedure Laterality Date   BIV ICD INSERTION CRT-D N/A 05/29/2017   SJM Quadra Assura implanted by Dr Johney Frame for secondary prevention of sudden death   CARDIAC CATHETERIZATION  2012   [facility]   CARD[facility]AC [facility]TERIZATION     [facility]   CATARACT EXTRACTION, BILATERAL     LEFT AND RIGHT HEART CATHETERIZATION WITH CORONARY ANGIOGRAM N/A 03/23/2014   Procedure: LEFT AND RIGHT HEART CATHETERIZATION WITH CORONARY ANGIOGRAM;  Surgeon: [name], MD;  Location: [facility] CATH [facility];  Service: Cardiovascular;  Laterality: N/A;   LEG SURGERY Left    surgery d/t injury from nail gun   RIGHT/LEFT HEART CATH AND CORONARY ANGIOGRAPHY N/A 05/16/2017   Procedure: RIGHT/LEFT HEART CATH AND CORONARY ANGIOGRAPHY;  Surgeon: [name], MD;  Location: [facility] INVAS[facility];  Service: Cardiovascular;  Laterality: N/A;   TEE WITHOUT CARDIOVERSION N/A 11/04/2017   Procedure: TRANSESOPHAGEAL ECHOCARDIOGRAM (TEE);  Surgeon: [name], MD;  Location: [facility] OR;  Service: Open  Heart Surgery;  Laterality: N/A;   TRANSCATHETER AORTIC VALVE REPLACEMENT, TRANSFEMORAL N/A 11/04/2017   Procedure: TRANSCATHETER AORTIC VALVE REPLACEMENT, TRANSFEMORAL;  Surgeon: [name], MD;  Location: [facility] OR;  Service: Open Heart Surgery;  Laterality: N/A;     Current Outpatient Medications  Medication Sig Dispense Refill   albuterol (VENTOLIN HFA) 108 (90 Base) MCG/ACT inhaler Inhale 2 puffs into the lungs every 6 (six) hours as needed for wheezing or shortness of breath.     amoxicillin (AMOXIL) 500 MG tablet Take  2,000 mg (four 500 mg tablets) one hour prior to dental appointments. 4 tablet 6   aspirin 81 MG tablet Take 81 mg by mouth daily.     atorvastatin (LIPITOR) 40 MG tablet Take 40 mg by mouth daily.     carvedilol (COREG) 6.25 MG tablet Take 1 tablet (6.25 mg total) by mouth 2 (two) times daily. 180 tablet 1   cetirizine (ZYRTEC) 10 MG tablet Take 10 mg by mouth daily.     fluticasone (FLONASE) 50 MCG/ACT nasal spray Place 1 spray into both nostrils daily.     Fluticasone-Salmeterol (ADVAIR) 250-50 MCG/DOSE AEPB Inhale 1 puff into the lungs 2 (two) times daily.     furosemide (LASIX) 20 MG tablet Take 1 tablet (20 mg total) by mouth daily as needed. For weight gain greater than 2 lb. 30 tablet 3   losartan (COZAAR) 25 MG tablet Take 1 tablet (25 mg total) by mouth daily. 30 tablet 3   pantoprazole (PROTONIX) 40 MG tablet Take 1 tablet (40 mg total) by mouth daily. 30 tablet 0   spironolactone (ALDACTONE) 25 MG tablet Take 1 tablet (25 mg total) by mouth daily. 30 tablet 0   tamsulosin (FLOMAX) 0.4 MG CAPS capsule Take 0.4 mg by mouth daily.     tiotropium (SPIRIVA) 18 MCG inhalation capsule Place 18 mcg into inhaler and inhale daily.     No current facility-administered medications for this visit.    Allergies:   Lisinopril, Dobutamine, Amlodipine, and Metoprolol    Social History:  The patient  reports that he quit smoking about 32 years ago. His smoking use included cigarettes. He started smoking about 32 years ago. He has quit using smokeless tobacco.  His smokeless tobacco use included snuff. He reports current alcohol use. He reports that he does not use drugs.   Family History:  The patient's family history includes Heart disease in his father; Heart failure in his father; Hyperlipidemia in his mother; Hypertension in his mother.    ROS:  Please see the history of present illness.   Otherwise, review of systems are positive for none.   All other systems are reviewed and negative.     PHYSICAL EXAM: VS:  BP 108/60 (BP Location: Left Arm, Patient Position: Sitting, Cuff Size: Normal)   Pulse 78   Ht 5\' 10"  (1.778 m)   Wt 212 lb 2 oz (96.2 kg)   SpO2 97%   BMI 30.44 kg/m  , BMI Body mass index is 30.44 kg/m. GEN: Well nourished, well developed, in no acute distress  HEENT: normal  Neck: no JVD, carotid bruits, or masses Cardiac: RRR; no  rubs, or gallops,no edema . There is 1/6 systolic murmur in the aortic area . Respiratory: Clear to auscultation, normal work of breathing GI: soft, nontender, nondistended, + BS MS: no deformity or atrophy  Skin: warm and dry, no rash Neuro:  Strength and sensation are intact Psych: euthymic mood, full affect   EKG:  EKG is ordered today. The ekg ordered today demonstrates atrial sensed ventricular paced rhythm .   Recent Labs: No results found for requested labs within last 365 days.    Lipid Panel    Component Value Date/Time   CHOL 156 06/04/2018 1116   CHOL 117 02/28/2014 0442   TRIG 120 06/04/2018 1116   TRIG 65 02/28/2014 0442   HDL 57 06/04/2018 1116   HDL 44 02/28/2014 0442   CHOLHDL 2.7 06/04/2018 1116   VLDL 13 02/28/2014 0442   LDLCALC 75 06/04/2018 1116   LDLCALC 60 02/28/2014 0442      Wt Readings from Last 3 Encounters:  05/27/23 212 lb 2 oz (96.2 kg)  11/28/22 209 lb (94.8 kg)  05/29/22 208 lb 6 oz (94.5 kg)       ASSESSMENT AND PLAN:  1.  Chronic systolic heart failure: Due to nonischemic cardiomyopathy.  He is currently New York Heart Association class II.  Most recent ejection fraction was 40 to 45%. Continue treatment with carvedilol, losartan and spironolactone.  He seems to be mildly volume overloaded based on symptoms and slightly elevated weight above baseline.  He uses furosemide as needed.  I elected to add Farxiga 10 mg daily.  Check basic metabolic profile in 1 week.    2.  Status post TAVR  for severe aortic stenosis: Stable.  Most recent echo showed normal functioning  prosthesis.  Continue aspirin.  3. Hyperlipidemia: Currently on atorvastatin 40 mg daily.  He had lipid profile done last month which showed an LDL of 84 and triglyceride of 61.  4.  Status post ICD CRT: Followed by the EP clinic.  EKG today shows normal functioning pacemaker.  5.  COPD: Seems to be stable.   Disposition:   FU with me in 6 months  Signed,  Lorine Bears, MD  05/27/2023 4:02 PM    [facility]

## 2023-05-27 NOTE — Progress Notes (Signed)
EPIC Encounter for ICM Monitoring  Patient Name: Benjamin Frost is a 82 y.o. male Date: 05/27/2023 Primary Care Physican: Leanna Sato, MD Primary Cardiologist: Kirke Corin Electrophysiologist: Townsend Roger Pacing:  98%           03/19/2023 Weight: 198 - 200 lbs                                                         Spoke with patient and heart failure questions reviewed.  Transmission results reviewed.  Pt asymptomatic for fluid accumulation.      Corvue Thoracic impedance suggesting normal fluid levels with the exception of possible fluid accumulation 8/10-8/16.   Prescribed:  Furosemide 20 mg take 1 tablet daily as needed for weight gain of 2 lbs or greater (takes on average once a month).  Spironolactone 25 mg take 1 tablet daily.   Recommendations:  No changes and encouraged to call if experiencing any fluid symptoms.   Follow-up plan: ICM clinic phone appointment on 07/01/2023.   91 day device clinic remote transmission 06/30/2023.    EP/Cardiology Office Visits:    Recall 05/24/2023 with Dr [name].   Recall 05/27/2023 with Dr Kirke Corin.   Copy of ICM check sent to [name]  3 month ICM trend: 05/27/2023.    12-14 Month ICM trend:     Karie Soda, RN 05/27/2023 2:48 PM

## 2023-06-24 NOTE — Progress Notes (Unsigned)
Cardiology Office Note Date:  06/25/2023  Patient ID:  Benjamin Frost, [place] March 28, 1941, MRN [id number] PCP:  Leanna Sato, MD  Cardiologist:  Lorine Bears, MD Electrophysiologist: [name], MD     Chief Complaint: CRT-D follow-up  History of Present Illness: Benjamin Frost is a 82 y.o. male with PMH notable for NICM, HFmrEF, s/p CRT-D, AS s/p TAVR, COPD, HTN; seen today for [name], MD for routine electrophysiology followup.  He last saw [name] 05/2022 and was doing well. He had elevated LV threshold with diaphragmatic stim and declined revision of the system.  He saw [name] 05/2023, having intermittent edema, lasix PRN. Added farxiga.  On follow-up today, he is overall feeling well. He had some increased edema and increased SOB earlier this week so took a PRN lasix yesterday. Spent much of last night in the bathroom but feels better this morning. He weighs himself 2-3 times per week. Thinks SOB is mostly d/t COPD and worsens with increased fluid. 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 He continues to have diaphragmatic stim when he sits very upright and on exhaling. This is not bothersome to him.  He denies chest pain, chest pressure. Has good appetite, no bloating or early satiety.    Device Information: St. Jude CRT-D, imp 05/2017; dx NICM, HFrEF  Past Medical History:  Diagnosis Date   AICD (automatic cardioverter/defibrillator) present    a. St Jude, placed after VF arrest during a dobuatamine echo.    Aortic stenosis, severe    a. 11/04/17: s/p TAVR by [name] and [name]; b. 10/2018 Echo: 26mm Edwards Sapien bioprosthetic AoV. Mean grad (down from on prior study).   Chronic systolic heart failure (HCC)    a. 2015 EF 10-15%; b. 10/2018 Echo: EF 40-45%, nl RV fxn. Mildly dil LA.   COPD (chronic obstructive pulmonary disease) (HCC)    GERD (gastroesophageal reflux disease)    History of BPH     History of kidney stones    Hyperlipidemia    Hypertension    Left bundle branch block    NICM (nonischemic cardiomyopathy) (HCC)    a. 2015 EF 10-15%; b. 03/2014 cath: nl cors; c. S/p SJM CRT-D; d. 10/2018 Echo: EF 40-45%.   S/P TAVR (transcatheter aortic valve replacement)    a. 10/2017: s/p TAVR with an Edwards Sapien 3 THV (size 26 mm, model # 9600TFX, serial # [id number])   Sinus tachycardia     Past Surgical History:  Procedure Laterality Date   BIV ICD INSERTION CRT-D N/A 05/29/2017   SJM Quadra Assura implanted by Dr Johney Frame for secondary prevention of sudden death   CARDIAC CATHETERIZATION  2012   [facility]   CARDIAC CATHETERIZATION     [facility]   CATARACT EXTRACTION, BILATERAL     LEFT AND RIGHT HEART CATHETERIZATION WITH CORONARY ANGIOGRAM N/A 03/23/2014   Procedure: LEFT AND RIGHT HEART CATHETERIZATION WITH CORONARY ANGIOGRAM;  Surgeon: [name], MD;  Location: [facility] CATH [facility];  Service: Cardiovascular;  Laterality: N/A;   LEG SURGERY Left    surgery d/t injury from nail gun   RIGHT/LEFT HEART CATH AND CORONARY ANGIOGRAPHY N/A 05/16/2017   Procedure: RIGHT/LEFT HEART CATH AND CORONARY ANGIOGRAPHY;  Surgeon: [name], MD;  Location: [facility] INVAS[facility] CV [facility];  Service: Cardiovascular;  Laterality: N/A;   TEE WITHOUT CARDIOVERSION N/A 11/04/2017   Procedure: TRANSESOPHAGEAL ECHOCARDIOGRAM (TEE);  Surgeon: [name], MD;  Location: [facility] OR;  Service: Open Heart Surgery;  Laterality: N/A;   TRANSCATHETER AORTIC VALVE REPLACEMENT, TRANSFEMORAL N/A 11/04/2017   Procedure: TRANSCATHETER AORTIC VALVE REPLACEMENT, TRANSFEMORAL;  Surgeon: [name], MD;  Location: [facility] OR;  Service: Open Heart Surgery;  Laterality: N/A;    Current Outpatient Medications  Medication Instructions   albuterol (VENTOLIN HFA) 108 (90 Base) MCG/ACT inhaler 2 puffs, Inhalation, Every 6 hours PRN   amoxicillin (AMOXIL) 500 MG tablet Take 2,000 mg (four 500 mg tablets) one hour prior to dental appointments.    aspirin 81 mg, Oral, Daily   atorvastatin (LIPITOR) 40 mg, Oral, Daily   carvedilol (COREG) 6.25 mg, Oral, 2 times daily   cetirizine (ZYRTEC) 10 mg, Oral, Daily   dapagliflozin propanediol (FARXIGA) 10 mg, Oral, Daily before breakfast   fluticasone (FLONASE) 50 MCG/ACT nasal spray 1 spray, Each Nare, Daily   Fluticasone-Salmeterol (ADVAIR) 250-50 MCG/DOSE AEPB 1 puff, Inhalation, 2 times daily   furosemide (LASIX) 20 mg, Oral, Daily PRN, For weight gain greater than 2 lb.   losartan (COZAAR) 25 mg, Oral, Daily   pantoprazole (PROTONIX) 40 mg, Oral, Daily   spironolactone (ALDACTONE) 25 mg, Oral, Daily   tamsulosin (FLOMAX) 0.4 mg, Oral, Daily   tiotropium (SPIRIVA) 18 mcg, Inhalation, Daily    Social History:  The patient  reports that he quit smoking about 32 years ago. His smoking use included cigarettes. He started smoking about 32 years ago. He has quit using smokeless tobacco.  His smokeless tobacco use included snuff. He reports current alcohol use. He reports that he does not use drugs.   Family History:  The patient's family history includes Heart disease in his father; Heart failure in his father; Hyperlipidemia in his mother; Hypertension in his mother.  ROS:  Please see the history of present illness. All other systems are reviewed and otherwise negative.   PHYSICAL EXAM:  VS:  BP 110/64 (BP Location: Left Arm, Patient Position: Sitting, Cuff Size: Normal)   Pulse 78   Ht 5\' 9"  (1.753 m)   Wt 210 lb 6 oz (95.4 kg)   SpO2 94%   BMI 31.07 kg/m  BMI: Body mass index is 31.07 kg/m.  GEN- The patient is well appearing, alert and oriented x 3 today.   Lungs- Clear to ausculation bilaterally, normal work of breathing.  Heart- Regular rate and rhythm, Murmur appreciated, rubs or gallops Extremities- Trace peripheral edema, warm, dry Skin-  device pocket well-healed, no tethering   Device interrogation done today and reviewed by myself:  Battery 1.6 years Lead thresholds,  impedence, sensing stable  Consistently elevated LV threshold VP 98% Two brief NSVT episodes many months ago No changes made today  EKG is ordered. Personal review of EKG from today shows:        05/27/2023: AS-VP, rate 78;  QRS  Recent Labs: No results found for requested labs within last 365 days.  No results found for requested labs within last 365 days.   CrCl cannot be calculated (Patient's most recent lab result is older than the maximum 21 days allowed.).   Wt Readings from Last 3 Encounters:  06/25/23 210 lb 6 oz (95.4 kg)  05/27/23 212 lb 2 oz (96.2 kg)  11/28/22 209 lb (94.8 kg)     Additional studies reviewed include: Previous EP, cardiology notes.   TTE, 11/03/2018  1. The left ventricle has mild-moderately reduced systolic function of 40-45%. The cavity size was mildly increased. There is mildly increased left ventricular wall thickness. Left ventricular  diastolic Doppler parameters are consistent with impaired relaxation.   2. The right ventricle has normal systolic function. The cavity was mildly enlarged. There is not assessed. Right ventricular systolic pressure could not be assessed.   3. Left atrial size was mildly dilated.   4. Right atrial size was mildly dilated.   5. The mitral valve is degenerative. There is mild thickening. There is moderate mitral annular calcification present.   6. The tricuspid valve is not assessed.   7. A 26mm Edwards Sapien bioprosthetic aortic valve valve is present in the aortic position. Procedure Date: 11/04/17. Mean gradient is 15 mmHg, decreased from 21 mmHg on prior study.   8. The aortic root and ascending aorta are normal in size and structure.   9. The interatrial septum was not well visualized.   TTE, 11/05/2017 - Left ventricle: The cavity size was mildly dilated. Wall thickness was normal. Systolic function was moderately to severely reduced. The estimated ejection fraction was in the range of 30% to 35%. Features  are consistent with a pseudonormal left ventricular filling pattern, with concomitant abnormal relaxation and increased filling pressure (grade 2 diastolic dysfunction).  - Aortic valve: Valve area (VTI): 1.35 cm^2. Valve area (Vmax): 1.28 cm^2. Valve area (Vmean): 1.35 cm^2.   ASSESSMENT AND PLAN:  #) St. Jude CRT-D in situ #) NICM High VP Consistently high LV threshold Discussed diaphragmatic stim with LV lead, patient will consider lead revision when gen replacement is due.   #) HFmrEF NYHA II symptoms Warm with trace lower extremity edema Recommended he weigh himself daily and take lasix if weight increases greater than 3lbs overnight or 5lbs in a week GDMT: coreg 6.25 BID, farxiga, losartan 25mg , spiro 25 Diuretic: lasix 20mg  PRN        Current medicines are reviewed at length with the patient today.   The patient does not have concerns regarding his medicines.  The following changes were made today:  none  Labs/ tests ordered today include:  No orders of the defined types were placed in this encounter.    Disposition: Follow up with [name] or EP APP in 6 months   Signed, Sherie Don, NP  06/25/23  11:49 AM  Electrophysiology [facility]

## 2023-06-25 ENCOUNTER — Encounter: Payer: Self-pay | Admitting: Cardiology

## 2023-06-25 ENCOUNTER — Ambulatory Visit: Payer: Medicare Other | Attending: Cardiology | Admitting: Cardiology

## 2023-06-25 VITALS — BP 110/64 | HR 78 | Ht 69.0 in | Wt 210.4 lb

## 2023-06-25 DIAGNOSIS — I5022 Chronic systolic (congestive) heart failure: Secondary | ICD-10-CM | POA: Diagnosis not present

## 2023-06-25 DIAGNOSIS — Z95810 Presence of automatic (implantable) cardiac defibrillator: Secondary | ICD-10-CM

## 2023-06-25 LAB — CUP PACEART INCLINIC DEVICE CHECK
Battery Remaining Longevity: 19 mo
Brady Statistic RA Percent Paced: 0.5 %
Brady Statistic RV Percent Paced: 98 %
Date Time Interrogation Session: 20241002120328
HighPow Impedance: 86.625
Implantable Lead Connection Status: 753985
Implantable Lead Connection Status: 753985
Implantable Lead Connection Status: 753985
Implantable Lead Implant Date: 20180906
Implantable Lead Implant Date: 20180906
Implantable Lead Implant Date: 20180906
Implantable Lead Location: 753858
Implantable Lead Location: 753859
Implantable Lead Location: 753860
Implantable Pulse Generator Implant Date: 20180906
Lead Channel Impedance Value: 1225 Ohm
Lead Channel Impedance Value: 550 Ohm
Lead Channel Impedance Value: 587.5 Ohm
Lead Channel Pacing Threshold Amplitude: 0.5 V
Lead Channel Pacing Threshold Amplitude: 0.5 V
Lead Channel Pacing Threshold Amplitude: 0.5 V
Lead Channel Pacing Threshold Amplitude: 0.5 V
Lead Channel Pacing Threshold Amplitude: 2.25 V
Lead Channel Pacing Threshold Amplitude: 2.25 V
Lead Channel Pacing Threshold Pulse Width: 0.4 ms
Lead Channel Pacing Threshold Pulse Width: 0.4 ms
Lead Channel Pacing Threshold Pulse Width: 0.5 ms
Lead Channel Pacing Threshold Pulse Width: 0.5 ms
Lead Channel Pacing Threshold Pulse Width: 1 ms
Lead Channel Pacing Threshold Pulse Width: 1 ms
Lead Channel Sensing Intrinsic Amplitude: 3.7 mV
Lead Channel Sensing Intrinsic Amplitude: 6.7 mV
Lead Channel Setting Pacing Amplitude: 1.5 V
Lead Channel Setting Pacing Amplitude: 2 V
Lead Channel Setting Pacing Amplitude: 3 V
Lead Channel Setting Pacing Pulse Width: 0.5 ms
Lead Channel Setting Pacing Pulse Width: 1 ms
Lead Channel Setting Sensing Sensitivity: 0.5 mV
Pulse Gen Serial Number: 7421078

## 2023-06-25 NOTE — Patient Instructions (Signed)
Medication Instructions:  The current medical regimen is effective;  continue present plan and medications as directed. Please refer to the Current Medication list given to you today.   *If you need a refill on your cardiac medications before your next appointment, please call your pharmacy*   Follow-Up: At [facility], you and your health needs are our priority.  As part of our continuing mission to provide you with exceptional heart care, we have created designated Provider Care Teams.  These Care Teams include your primary Cardiologist (physician) and Advanced Practice Providers (APPs -  Physician Assistants and Nurse Practitioners) who all work together to provide you with the care you need, when you need it.  We recommend signing up for the patient portal called "MyChart".  Sign up information is provided on this After Visit Summary.  MyChart is used to connect with patients for Virtual Visits (Telemedicine).  Patients are able to view lab/test results, encounter notes, upcoming appointments, etc.  Non-urgent messages can be sent to your provider as well.   To learn more about what you can do with MyChart, go to [email or web address].    Your next appointment:   6 month(s)  Provider:   Sherie Don, NP

## 2023-06-30 ENCOUNTER — Ambulatory Visit (INDEPENDENT_AMBULATORY_CARE_PROVIDER_SITE_OTHER): Payer: Medicare Other

## 2023-06-30 DIAGNOSIS — I5022 Chronic systolic (congestive) heart failure: Secondary | ICD-10-CM

## 2023-06-30 DIAGNOSIS — I428 Other cardiomyopathies: Secondary | ICD-10-CM

## 2023-06-30 LAB — CUP PACEART REMOTE DEVICE CHECK
Battery Remaining Longevity: 17 mo
Battery Remaining Percentage: 22 %
Battery Voltage: 2.78 V
Brady Statistic AP VP Percent: 1 %
Brady Statistic AP VS Percent: 1 %
Brady Statistic AS VP Percent: 97 %
Brady Statistic AS VS Percent: 1 %
Brady Statistic RA Percent Paced: 1 %
Date Time Interrogation Session: 20241007040017
HighPow Impedance: 79 Ohm
HighPow Impedance: 79 Ohm
Implantable Lead Connection Status: 753985
Implantable Lead Connection Status: 753985
Implantable Lead Connection Status: 753985
Implantable Lead Implant Date: 20180906
Implantable Lead Implant Date: 20180906
Implantable Lead Implant Date: 20180906
Implantable Lead Location: 753858
Implantable Lead Location: 753859
Implantable Lead Location: 753860
Implantable Pulse Generator Implant Date: 20180906
Lead Channel Impedance Value: 1125 Ohm
Lead Channel Impedance Value: 490 Ohm
Lead Channel Impedance Value: 650 Ohm
Lead Channel Pacing Threshold Amplitude: 0.5 V
Lead Channel Pacing Threshold Amplitude: 0.5 V
Lead Channel Pacing Threshold Amplitude: 2.25 V
Lead Channel Pacing Threshold Pulse Width: 0.4 ms
Lead Channel Pacing Threshold Pulse Width: 0.5 ms
Lead Channel Pacing Threshold Pulse Width: 1 ms
Lead Channel Sensing Intrinsic Amplitude: 12 mV
Lead Channel Sensing Intrinsic Amplitude: 3.3 mV
Lead Channel Setting Pacing Amplitude: 1.5 V
Lead Channel Setting Pacing Amplitude: 2 V
Lead Channel Setting Pacing Amplitude: 2.5 V
Lead Channel Setting Pacing Pulse Width: 0.5 ms
Lead Channel Setting Pacing Pulse Width: 1 ms
Lead Channel Setting Sensing Sensitivity: 0.5 mV
Pulse Gen Serial Number: 7421078

## 2023-07-01 ENCOUNTER — Ambulatory Visit: Payer: Medicare Other | Attending: Cardiology

## 2023-07-01 DIAGNOSIS — I5022 Chronic systolic (congestive) heart failure: Secondary | ICD-10-CM | POA: Diagnosis not present

## 2023-07-01 DIAGNOSIS — Z95810 Presence of automatic (implantable) cardiac defibrillator: Secondary | ICD-10-CM

## 2023-07-02 NOTE — Progress Notes (Signed)
EPIC Encounter for ICM Monitoring  Patient Name: Benjamin Frost is a 82 y.o. male Date: 07/02/2023 Primary Care Physican: Leanna Sato, MD Primary Cardiologist: Kirke Corin Electrophysiologist: Townsend Roger Pacing:  98%           03/19/2023 Weight: 198 - 200 lbs 07/02/2023 Weight: 200 lbs                                                          Spoke with patient and heart failure questions reviewed.  Transmission results reviewed.  Pt asymptomatic for fluid accumulation.      Corvue Thoracic impedance suggesting possible fluid accumulation starting 10/6.   Prescribed:  Furosemide 20 mg take 1 tablet daily as needed for weight gain of 2 lbs or greater (takes on average once a month).  Spironolactone 25 mg take 1 tablet daily.   Recommendations:  Advised to take PRN Lasix x 1 day.   Follow-up plan: ICM clinic phone appointment on 07/08/2023 to recheck fluid levels.   91 day device clinic remote transmission 09/29/2023.    EP/Cardiology Office Visits:    Last EP visit was 06/25/2023 (no recall)   Recall 11/24/2023 with Dr Kirke Corin.   Copy of ICM check sent to [name].  3 month ICM trend: 07/01/2023.    12-14 Month ICM trend:     Karie Soda, RN 07/02/2023 12:38 PM

## 2023-07-08 ENCOUNTER — Ambulatory Visit: Payer: Medicare Other | Attending: Cardiology

## 2023-07-08 DIAGNOSIS — Z95810 Presence of automatic (implantable) cardiac defibrillator: Secondary | ICD-10-CM

## 2023-07-08 DIAGNOSIS — I5022 Chronic systolic (congestive) heart failure: Secondary | ICD-10-CM

## 2023-07-09 NOTE — Progress Notes (Signed)
EPIC Encounter for ICM Monitoring  Patient Name: Benjamin Frost is a 82 y.o. male Date: 07/09/2023 Primary Care Physican: Leanna Sato, MD Primary Cardiologist: Kirke Corin Electrophysiologist: Townsend Roger Pacing:  97%           03/19/2023 Weight: 198 - 200 lbs 07/02/2023 Weight: 200 lbs 07/09/2023 Weight: 200 lbs                                                        Spoke with patient and heart failure questions reviewed.  Transmission results reviewed.  Pt asymptomatic for fluid accumulation.  Reports feeling well at this time and voices no complaints.     Corvue Thoracic impedance suggesting fluid levels returned to normal after taking PRN Lasix.   Prescribed:  Furosemide 20 mg take 1 tablet daily as needed for weight gain of 2 lbs or greater (takes on average once a month).  Spironolactone 25 mg take 1 tablet daily.   Recommendations:  No changes and encouraged to call if experiencing any fluid symptoms.   Follow-up plan: ICM clinic phone appointment on 08/03/2024.   91 day device clinic remote transmission 09/29/2023.    EP/Cardiology Office Visits:    Last EP visit was 06/25/2023 (no recall)   Recall 11/24/2023 with Dr Kirke Corin.   Copy of ICM check sent to [name].  3 month ICM trend: 07/08/2023.    12-14 Month ICM trend:     Karie Soda, RN 07/09/2023 8:00 AM

## 2023-07-14 NOTE — Progress Notes (Signed)
Remote ICD transmission.   

## 2023-08-04 ENCOUNTER — Ambulatory Visit: Payer: Medicare Other | Attending: Cardiology

## 2023-08-04 DIAGNOSIS — Z95810 Presence of automatic (implantable) cardiac defibrillator: Secondary | ICD-10-CM

## 2023-08-04 DIAGNOSIS — I5022 Chronic systolic (congestive) heart failure: Secondary | ICD-10-CM | POA: Diagnosis not present

## 2023-08-08 NOTE — Progress Notes (Signed)
EPIC Encounter for ICM Monitoring  Patient Name: Benjamin Frost is a 82 y.o. male Date: 08/08/2023 Primary Care Physican: Leanna Sato, MD Primary Cardiologist: Kirke Corin Electrophysiologist: Townsend Roger Pacing:  98%           03/19/2023 Weight: 198 - 200 lbs 07/02/2023 Weight: 200 lbs 07/09/2023 Weight: 200 lbs      08/08/2023 Weight: 200 lbs                                                   Spoke with patient and heart failure questions reviewed.  Transmission results reviewed.  Pt asymptomatic for fluid accumulation.  Reports feeling well at this time and voices no complaints.     Corvue Thoracic impedance suggesting intermittent days with possible fluid accumulation and possible dryness within the last month.   Prescribed:  Furosemide 20 mg take 1 tablet daily as needed for weight gain of 2 lbs or greater (takes on average once a month).  Spironolactone 25 mg take 1 tablet daily.   Recommendations:  Advised to maintain fluid intake around 64 oz daily to help fluid levels stay balanced.    No changes and encouraged to call if experiencing any fluid symptoms.   Follow-up plan: ICM clinic phone appointment on 09/21/2024.   91 day device clinic remote transmission 09/29/2023.    EP/Cardiology Office Visits:    12/24/2023 with Sherie Don, NP.   Recall 11/24/2023 with Dr Kirke Corin.   Copy of ICM check sent to [name].  3 month ICM trend: 08/04/2023.    12-14 Month ICM trend:     Karie Soda, RN 08/08/2023 9:09 AM

## 2023-09-18 ENCOUNTER — Other Ambulatory Visit: Payer: Self-pay | Admitting: Cardiovascular Disease

## 2023-09-22 ENCOUNTER — Ambulatory Visit: Payer: Medicare Other | Attending: Cardiology

## 2023-09-22 DIAGNOSIS — I5022 Chronic systolic (congestive) heart failure: Secondary | ICD-10-CM

## 2023-09-22 DIAGNOSIS — Z95810 Presence of automatic (implantable) cardiac defibrillator: Secondary | ICD-10-CM | POA: Diagnosis not present

## 2023-09-23 NOTE — Progress Notes (Signed)
EPIC Encounter for ICM Monitoring  Patient Name: Benjamin Frost is a 82 y.o. male Date: 09/23/2023 Primary Care Physican: Leanna Sato, MD Primary Cardiologist: Kirke Corin Electrophysiologist: Townsend Roger Pacing:  98%           03/19/2023 Weight: 198 - 200 lbs 07/02/2023 Weight: 200 lbs 07/09/2023 Weight: 200 lbs      08/08/2023 Weight: 200 lbs                                                   Spoke with patient and heart failure questions reviewed.  Transmission results reviewed.  Pt asymptomatic for fluid accumulation.  Reports feeling well at this time and voices no complaints.       Corvue Thoracic impedance suggesting possible fluid accumulation starting 12/29.  Also suggesting possible fluid accumulation from 12/7-12/18.   Prescribed:  Furosemide 20 mg take 1 tablet daily as needed for weight gain of 2 lbs or greater (takes on average once a month).  Spironolactone 25 mg take 1 tablet daily.   Recommendations:  Pt will take PRN Lasix x 1 day.     Follow-up plan: ICM clinic phone appointment on 09/29/2023 to recheck fluid levels.   91 day device clinic remote transmission 09/29/2023.    EP/Cardiology Office Visits:    12/24/2023 with Sherie Don, NP.   Recall 11/24/2023 with Dr Kirke Corin.   Copy of ICM check sent to [name].  3 month ICM trend: 09/22/2023.    12-14 Month ICM trend:     Karie Soda, RN 09/23/2023 1:40 PM

## 2023-09-29 ENCOUNTER — Ambulatory Visit (INDEPENDENT_AMBULATORY_CARE_PROVIDER_SITE_OTHER): Payer: Medicare Other

## 2023-09-29 ENCOUNTER — Ambulatory Visit: Payer: Medicare Other | Attending: Cardiology

## 2023-09-29 ENCOUNTER — Other Ambulatory Visit: Payer: Self-pay | Admitting: Family Medicine

## 2023-09-29 DIAGNOSIS — R109 Unspecified abdominal pain: Secondary | ICD-10-CM

## 2023-09-29 DIAGNOSIS — I5022 Chronic systolic (congestive) heart failure: Secondary | ICD-10-CM

## 2023-09-29 DIAGNOSIS — Z95810 Presence of automatic (implantable) cardiac defibrillator: Secondary | ICD-10-CM

## 2023-09-29 DIAGNOSIS — I428 Other cardiomyopathies: Secondary | ICD-10-CM

## 2023-09-29 NOTE — Progress Notes (Signed)
 EPIC Encounter for ICM Monitoring  Patient Name: Benjamin Frost is a 83 y.o. male Date: 09/29/2023 Primary Care Physican: Buren Rock HERO, MD Primary Cardiologist: Darron Electrophysiologist: Cindie Pore Pacing:  98%           03/19/2023 Weight: 198 - 200 lbs 07/02/2023 Weight: 200 lbs 07/09/2023 Weight: 200 lbs      08/08/2023 Weight: 200 lbs                                                   Spoke with patient and heart failure questions reviewed.  Transmission results reviewed.  Pt has kidney stone that passed which may contributed to the return of possible fluid accumulation.    He has increased fluid intake due to Kidney stone   Corvue Thoracic impedance suggesting fluid levels returned to normal on 1/1 but possible fluid accumulation restarted 1/4.     Prescribed:  Furosemide  20 mg take 1 tablet daily as needed for weight gain of 2 lbs or greater (takes on average once a month).  Spironolactone  25 mg take 1 tablet daily.   Recommendations:  He will take PRN Lasix  as needed.     Follow-up plan: ICM clinic phone appointment on 10/14/2023 to recheck fluid levels.   91 day device clinic remote transmission 12/29/2023.    EP/Cardiology Office Visits:    12/24/2023 with Suzann Riddle, NP.   Recall 11/24/2023 with Dr Darron.   Copy of ICM check sent to Dr. Cindie and Dr Darron as RICK.   3 month ICM trend: 09/28/2022.    12-14 Month ICM trend:     [name], RN 09/29/2023 7:13 AM

## 2023-09-30 LAB — CUP PACEART REMOTE DEVICE CHECK
Battery Remaining Longevity: 13 mo
Battery Remaining Percentage: 18 %
Battery Voltage: 2.75 V
Brady Statistic AP VP Percent: 1 %
Brady Statistic AP VS Percent: 1 %
Brady Statistic AS VP Percent: 97 %
Brady Statistic AS VS Percent: 1.2 %
Brady Statistic RA Percent Paced: 1 %
Date Time Interrogation Session: 20250106040013
HighPow Impedance: 79 Ohm
HighPow Impedance: 79 Ohm
Implantable Lead Connection Status: 753985
Implantable Lead Connection Status: 753985
Implantable Lead Connection Status: 753985
Implantable Lead Implant Date: 20180906
Implantable Lead Implant Date: 20180906
Implantable Lead Implant Date: 20180906
Implantable Lead Location: 753858
Implantable Lead Location: 753859
Implantable Lead Location: 753860
Implantable Pulse Generator Implant Date: 20180906
Lead Channel Impedance Value: 1175 Ohm
Lead Channel Impedance Value: 490 Ohm
Lead Channel Impedance Value: 590 Ohm
Lead Channel Pacing Threshold Amplitude: 0.5 V
Lead Channel Pacing Threshold Amplitude: 0.5 V
Lead Channel Pacing Threshold Amplitude: 2.75 V
Lead Channel Pacing Threshold Pulse Width: 0.4 ms
Lead Channel Pacing Threshold Pulse Width: 0.5 ms
Lead Channel Pacing Threshold Pulse Width: 1 ms
Lead Channel Sensing Intrinsic Amplitude: 12 mV
Lead Channel Sensing Intrinsic Amplitude: 3.5 mV
Lead Channel Setting Pacing Amplitude: 1.5 V
Lead Channel Setting Pacing Amplitude: 2 V
Lead Channel Setting Pacing Amplitude: 3 V
Lead Channel Setting Pacing Pulse Width: 0.5 ms
Lead Channel Setting Pacing Pulse Width: 1 ms
Lead Channel Setting Sensing Sensitivity: 0.5 mV
Pulse Gen Serial Number: 7421078

## 2023-10-01 ENCOUNTER — Ambulatory Visit
Admission: RE | Admit: 2023-10-01 | Discharge: 2023-10-01 | Disposition: A | Discharge status: Home / Self Care | Payer: Medicare Other | Source: Ambulatory Visit | Attending: Family Medicine | Admitting: Family Medicine

## 2023-10-01 DIAGNOSIS — R109 Unspecified abdominal pain: Secondary | ICD-10-CM | POA: Diagnosis present

## 2023-10-14 ENCOUNTER — Ambulatory Visit: Payer: Medicare Other | Attending: Cardiology

## 2023-10-14 DIAGNOSIS — Z95810 Presence of automatic (implantable) cardiac defibrillator: Secondary | ICD-10-CM

## 2023-10-14 DIAGNOSIS — I5022 Chronic systolic (congestive) heart failure: Secondary | ICD-10-CM

## 2023-10-15 ENCOUNTER — Telehealth: Payer: Self-pay

## 2023-10-15 NOTE — Progress Notes (Signed)
EPIC Encounter for ICM Monitoring  Patient Name: Benjamin Frost is a 83 y.o. male Date: 10/15/2023 Primary Care Physican: Leanna Sato, MD Primary Cardiologist: Kirke Corin Electrophysiologist: Townsend Roger Pacing:  98%           03/19/2023 Weight: 198 - 200 lbs 07/02/2023 Weight: 200 lbs 07/09/2023 Weight: 200 lbs      08/08/2023 Weight: 200 lbs                                                   Attempted call to patient and unable to reach.  Left detailed message per DPR regarding transmission.  Transmission results reviewed.    Corvue Thoracic impedance suggesting fluid levels returned to normal.     Prescribed:  Furosemide 20 mg take 1 tablet daily as needed for weight gain of 2 lbs or greater (takes on average once a month).  Spironolactone 25 mg take 1 tablet daily.   Recommendations:  Left voice mail with ICM number and encouraged to call if experiencing any fluid symptoms.   Follow-up plan: ICM clinic phone appointment on 10/27/2023.   91 day device clinic remote transmission 12/29/2023.    EP/Cardiology Office Visits:    12/24/2023 with Sherie Don, NP.   Recall 11/24/2023 with Dr Kirke Corin.   Copy of ICM check sent to [name]  3 month ICM trend: 10/14/2023.    12-14 Month ICM trend:     Karie Soda, RN 10/15/2023 3:48 PM

## 2023-10-15 NOTE — Telephone Encounter (Signed)
Remote ICM transmission received.  Attempted call to patient regarding ICM remote transmission and left detailed message per DPR.  Left ICM phone number and advised to return call for any fluid symptoms or questions. Next ICM remote transmission scheduled 10/27/2023.

## 2023-10-27 ENCOUNTER — Ambulatory Visit: Payer: Medicare Other | Attending: Cardiology

## 2023-10-27 DIAGNOSIS — Z95810 Presence of automatic (implantable) cardiac defibrillator: Secondary | ICD-10-CM | POA: Diagnosis not present

## 2023-10-27 DIAGNOSIS — I5022 Chronic systolic (congestive) heart failure: Secondary | ICD-10-CM

## 2023-10-29 NOTE — Progress Notes (Signed)
EPIC Encounter for ICM Monitoring  Patient Name: Benjamin Frost is a 83 y.o. male Date: 10/29/2023 Primary Care Physican: Leanna Sato, MD Primary Cardiologist: Kirke Corin Electrophysiologist: Townsend Roger Pacing:  98%           03/19/2023 Weight: 198 - 200 lbs 07/02/2023 Weight: 200 lbs 07/09/2023 Weight: 200 lbs      08/08/2023 Weight: 200 lbs     10/29/2023 Weight: 198 lbs                                               Spoke with patient and heart failure questions reviewed.  Transmission results reviewed.  Pt asymptomatic for fluid accumulation.  No fluid symptoms during decreased impedance.  He had a kidney stone about a month ago.      Corvue Thoracic impedance suggesting normal fluid levels with the exception of possible fluid accumulation starting 1/26 and returned to normal 2/2.   Prescribed:  Furosemide 20 mg take 1 tablet daily as needed for weight gain of 2 lbs or greater (takes on average once a month).  Spironolactone 25 mg take 1 tablet daily.   Recommendations:  No changes and encouraged to call if experiencing any fluid symptoms.   Follow-up plan: ICM clinic phone appointment on 12/01/2023.   91 day device clinic remote transmission 12/29/2023.    EP/Cardiology Office Visits:    12/24/2023 with Sherie Don, NP.   Recall 11/24/2023 with Dr Kirke Corin.   Copy of ICM check sent to [name].   3 month ICM trend: 10/27/2023.    12-14 Month ICM trend:     Karie Soda, RN 10/29/2023 4:37 PM

## 2023-11-03 ENCOUNTER — Ambulatory Visit (INDEPENDENT_AMBULATORY_CARE_PROVIDER_SITE_OTHER): Payer: Medicare Other

## 2023-11-03 DIAGNOSIS — I5022 Chronic systolic (congestive) heart failure: Secondary | ICD-10-CM | POA: Diagnosis not present

## 2023-11-03 DIAGNOSIS — I428 Other cardiomyopathies: Secondary | ICD-10-CM

## 2023-11-04 LAB — CUP PACEART REMOTE DEVICE CHECK
Battery Remaining Longevity: 12 mo
Battery Remaining Percentage: 16 %
Battery Voltage: 2.74 V
Brady Statistic AP VP Percent: 1 %
Brady Statistic AP VS Percent: 1 %
Brady Statistic AS VP Percent: 97 %
Brady Statistic AS VS Percent: 1.2 %
Brady Statistic RA Percent Paced: 1 %
Date Time Interrogation Session: 20250210040016
HighPow Impedance: 95 Ohm
HighPow Impedance: 95 Ohm
Implantable Lead Connection Status: 753985
Implantable Lead Connection Status: 753985
Implantable Lead Connection Status: 753985
Implantable Lead Implant Date: 20180906
Implantable Lead Implant Date: 20180906
Implantable Lead Implant Date: 20180906
Implantable Lead Location: 753858
Implantable Lead Location: 753859
Implantable Lead Location: 753860
Implantable Pulse Generator Implant Date: 20180906
Lead Channel Impedance Value: 1200 Ohm
Lead Channel Impedance Value: 540 Ohm
Lead Channel Impedance Value: 560 Ohm
Lead Channel Pacing Threshold Amplitude: 0.5 V
Lead Channel Pacing Threshold Amplitude: 0.625 V
Lead Channel Pacing Threshold Amplitude: 2.375 V
Lead Channel Pacing Threshold Pulse Width: 0.4 ms
Lead Channel Pacing Threshold Pulse Width: 0.5 ms
Lead Channel Pacing Threshold Pulse Width: 1 ms
Lead Channel Sensing Intrinsic Amplitude: 12 mV
Lead Channel Sensing Intrinsic Amplitude: 3.6 mV
Lead Channel Setting Pacing Amplitude: 1.625
Lead Channel Setting Pacing Amplitude: 2 V
Lead Channel Setting Pacing Amplitude: 2.625
Lead Channel Setting Pacing Pulse Width: 0.5 ms
Lead Channel Setting Pacing Pulse Width: 1 ms
Lead Channel Setting Sensing Sensitivity: 0.5 mV
Pulse Gen Serial Number: 7421078

## 2023-11-05 NOTE — Progress Notes (Signed)
Remote ICD transmission.

## 2023-11-08 ENCOUNTER — Encounter: Payer: Self-pay | Admitting: Cardiology

## 2023-12-01 ENCOUNTER — Ambulatory Visit: Payer: Medicare Other | Attending: Cardiology

## 2023-12-01 DIAGNOSIS — Z95810 Presence of automatic (implantable) cardiac defibrillator: Secondary | ICD-10-CM

## 2023-12-01 DIAGNOSIS — I5022 Chronic systolic (congestive) heart failure: Secondary | ICD-10-CM | POA: Diagnosis not present

## 2023-12-03 NOTE — Progress Notes (Signed)
 EPIC Encounter for ICM Monitoring  Patient Name: Benjamin Frost is a 83 y.o. male Date: 12/03/2023 Primary Care Physican: Leanna Sato, MD Primary Cardiologist: Kirke Corin Electrophysiologist: Townsend Roger Pacing:  98%           03/19/2023 Weight: 198 - 200 lbs 07/02/2023 Weight: 200 lbs 07/09/2023 Weight: 200 lbs      08/08/2023 Weight: 200 lbs     10/29/2023 Weight: 198 lbs   12/03/2023 Weight: 196-197 lbs                                             Spoke with patient and heart failure questions reviewed.  Transmission results reviewed.  Pt asymptomatic for fluid accumulation.  He took Lasix during decreased fluid.     Corvue Thoracic impedance suggesting possible fluid accumulation from 2/26-3/3 followed by possible dryness 3/4 and back to normal 3/10.   Prescribed:  Furosemide 20 mg take 1 tablet daily as needed for weight gain of 2 lbs or greater (takes on average once a month).    Recommendations:  No changes and encouraged to call if experiencing any fluid symptoms.   Follow-up plan: ICM clinic phone appointment on 01/05/2024.   91 day device clinic remote transmission 02/02/2024.    EP/Cardiology Office Visits:    12/24/2023 with Sherie Don, NP.   Recall 11/24/2023 with Dr Kirke Corin (6 month f/u).   Copy of ICM check sent to [name].   3 month ICM trend: 12/01/2023.    12-14 Month ICM trend:     Karie Soda, RN 12/03/2023 7:39 AM

## 2023-12-08 NOTE — Progress Notes (Signed)
 Remote ICD transmission.

## 2023-12-08 NOTE — Addendum Note (Signed)
 Addended by: Geralyn Flash D on: 12/08/2023 04:30 PM   Modules accepted: Orders

## 2023-12-23 ENCOUNTER — Other Ambulatory Visit: Payer: Self-pay | Admitting: Cardiovascular Disease

## 2023-12-23 NOTE — Progress Notes (Unsigned)
 Electrophysiology Clinic Note    Date:  12/24/2023  Patient ID:  Benjamin Frost, Benjamin Frost 1941-08-15, MRN [id number] PCP:  Leanna Sato, MD  Cardiologist:  Lorine Bears, MD Electrophysiologist: [name], MD   Discussed the use of AI scribe software for clinical note transcription with the patient, who gave verbal consent to proceed.   Patient Profile    Chief Complaint: routine CRT-D follow-up  History of Present Illness: Benjamin Frost is a 83 y.o. male with PMH notable for NICM, VF arrest, HFimpEF, s/p CRT-D, AS s/p TAVR, COPD, HTN ; seen today for [name], MD for routine electrophysiology followup.   I last saw him 06/2023. He was needing his PRN lasix intermittently. He has chronically elevated LV threshold, but not agreeable to lead revision.   He is followed monthly by HF nurse. Last appt 3/10 with stable fluid level.  On follow-up today, he continues to have DOE needing oxygen at night and when around the house. He is able to sit without feeling winded. His breathing is worse when he has extra fluid, and he takes his PRN lasix for this. Does not typically have increased lower extremity edema. He weighs himself a couple times a week, dry weight is 195/200lbs. He typical needs PRN lasix about 1 / month.   He continues to have "chest thumping" when he lays flat. It is not painful, and he is not bothered by it. He sleeps on his side to reduce the thumping, and sleeps well.      Arrhythmia/Device History [employer] Jude CRT-D, imp 05/2017; dx NICM, HFrEF  + diaphragmatic stim      ROS:  Please see the history of present illness. All other systems are reviewed and otherwise negative.    Physical Exam    VS:  BP 131/83 (BP Location: Left Arm, Patient Position: Sitting, Cuff Size: Large)   Pulse 70   Ht 5\' 9"  (1.753 m)   Wt 206 lb 6.4 oz (93.6 kg)   SpO2 92% Comment: wears 2L at home  BMI 30.48 kg/m  BMI: Body mass index is 30.48 kg/m.  Wt Readings  from Last 3 Encounters:  12/24/23 206 lb 6.4 oz (93.6 kg)  06/25/23 210 lb 6 oz (95.4 kg)  05/27/23 212 lb 2 oz (96.2 kg)     GEN- The patient is well appearing, alert and oriented x 3 today.   Lungs- Clear but diminished throughout, normal work of breathing.  Heart- Regular rate and rhythm, no murmurs, rubs or gallops Extremities- No peripheral edema, warm, dry Skin-   device pocket well-healed, no tethering   Device interrogation done today and reviewed by myself:  Battery 10 months Lead thresholds, impedence, sensing stable  Chronically elevated LV threshold High VP Two brief SVT episodes No changes made today   Studies Reviewed   Previous EP, cardiology notes.    EKG is ordered. Personal review of EKG from today shows:  VP at 70bpm; QRS         TTE, 11/03/2018  1. The left ventricle has mild-moderately reduced systolic function of 40-45%. The cavity size was mildly increased. There is mildly increased left ventricular wall thickness. Left ventricular diastolic Doppler parameters are consistent with impaired relaxation.   2. The right ventricle has normal systolic function. The cavity was mildly enlarged. There is not assessed. Right ventricular systolic pressure could not be assessed.   3. Left atrial size was mildly dilated.   4. Right atrial size  was mildly dilated.   5. The mitral valve is degenerative. There is mild thickening. There is moderate mitral annular calcification present.   6. The tricuspid valve is not assessed.   7. A 26mm Edwards Sapien bioprosthetic aortic valve valve is present in the aortic position. Procedure Date: 11/04/17. Mean gradient is 15 mmHg, decreased from 21 mmHg on prior study.   8. The aortic root and ascending aorta are normal in size and structure.  9. The interatrial septum was not well visualized.       Assessment and Plan     #) NICM #) VF arrest #) s/p CRT-D High VP Corvue stable Continues to have chronically  elevated LV threshold with diaphragmatic stimulation.  Unable to program around stim VT remains quiescent    #) HFmrEF, previously LVEF reduced #) COPD Unclear how much of patient's symptoms are related to HF vs COPD Continue monthly ICM nursing visits Continue PRN lasix for weight gain and/or worsening breathing status Continue 6.25mg  coreg BID, 10mg  farxiga, 25mg  losartan, 25mg  spiro CorVue stable at this time Update BMP, mag         Current medicines are reviewed at length with the patient today.   The patient does not have concerns regarding his medicines.  The following changes were made today:  none  Labs/ tests ordered today include:  Orders Placed This Encounter  Procedures   Basic metabolic panel with GFR   Magnesium   EKG 12-Lead     Disposition: Follow up with [name]  in 6 months to discuss possible revision of LV lead and gen change  Follow-up with general cardiology in 3 months   Signed, Sherie Don, NP  12/24/23  12:07 PM  Electrophysiology [facility]

## 2023-12-24 ENCOUNTER — Ambulatory Visit: Payer: Medicare Other | Attending: Cardiology | Admitting: Cardiology

## 2023-12-24 VITALS — BP 131/83 | HR 70 | Ht 69.0 in | Wt 206.4 lb

## 2023-12-24 DIAGNOSIS — I5022 Chronic systolic (congestive) heart failure: Secondary | ICD-10-CM | POA: Diagnosis not present

## 2023-12-24 DIAGNOSIS — I4901 Ventricular fibrillation: Secondary | ICD-10-CM | POA: Diagnosis not present

## 2023-12-24 DIAGNOSIS — I428 Other cardiomyopathies: Secondary | ICD-10-CM

## 2023-12-24 DIAGNOSIS — Z95810 Presence of automatic (implantable) cardiac defibrillator: Secondary | ICD-10-CM | POA: Diagnosis not present

## 2023-12-24 LAB — CUP PACEART INCLINIC DEVICE CHECK
Battery Remaining Longevity: 10 mo
Brady Statistic RA Percent Paced: 0.27 %
Brady Statistic RV Percent Paced: 98 %
Date Time Interrogation Session: 20250402123420
HighPow Impedance: 85.5 Ohm
Implantable Lead Connection Status: 753985
Implantable Lead Connection Status: 753985
Implantable Lead Connection Status: 753985
Implantable Lead Implant Date: 20180906
Implantable Lead Implant Date: 20180906
Implantable Lead Implant Date: 20180906
Implantable Lead Location: 753858
Implantable Lead Location: 753859
Implantable Lead Location: 753860
Implantable Pulse Generator Implant Date: 20180906
Lead Channel Impedance Value: 1287.5 Ohm
Lead Channel Impedance Value: 562.5 Ohm
Lead Channel Impedance Value: 575 Ohm
Lead Channel Pacing Threshold Amplitude: 0.5 V
Lead Channel Pacing Threshold Amplitude: 0.5 V
Lead Channel Pacing Threshold Amplitude: 0.5 V
Lead Channel Pacing Threshold Amplitude: 0.5 V
Lead Channel Pacing Threshold Amplitude: 2.125 V
Lead Channel Pacing Threshold Pulse Width: 0.4 ms
Lead Channel Pacing Threshold Pulse Width: 0.4 ms
Lead Channel Pacing Threshold Pulse Width: 0.5 ms
Lead Channel Pacing Threshold Pulse Width: 0.5 ms
Lead Channel Pacing Threshold Pulse Width: 1 ms
Lead Channel Sensing Intrinsic Amplitude: 3.8 mV
Lead Channel Sensing Intrinsic Amplitude: 4.5 mV
Lead Channel Setting Pacing Amplitude: 1.5 V
Lead Channel Setting Pacing Amplitude: 2 V
Lead Channel Setting Pacing Amplitude: 2.375
Lead Channel Setting Pacing Pulse Width: 0.5 ms
Lead Channel Setting Pacing Pulse Width: 1 ms
Lead Channel Setting Sensing Sensitivity: 0.5 mV
Pulse Gen Serial Number: 7421078

## 2023-12-24 NOTE — Patient Instructions (Signed)
 Medication Instructions:  The current medical regimen is effective;  continue present plan and medications as directed. Please refer to the Current Medication list given to you today.   *If you need a refill on your cardiac medications before your next appointment, please call your pharmacy*  Lab Work: Your provider would like for you to have following labs drawn today BMET, MAG.   If you have labs (blood work) drawn today and your tests are completely normal, you will receive your results only by: MyChart Message (if you have MyChart) OR A paper copy in the mail If you have any lab test that is abnormal or we need to change your treatment, we will call you to review the results.  Follow-Up: At [facility], you and your health needs are our priority.  As part of our continuing mission to provide you with exceptional heart care, our providers are all part of one team.  This team includes your primary Cardiologist (physician) and Advanced Practice Providers or APPs (Physician Assistants and Nurse Practitioners) who all work together to provide you with the care you need, when you need it.  Your next appointment:   3 month(s)  Provider:   Lorine Bears, MD or Nicolasa Ducking, NP    Follow up appointment with EP: 6 months with Dr.Lambert (ONLY)      We recommend signing up for the patient portal called "MyChart".  Sign up information is provided on this After Visit Summary.  MyChart is used to connect with patients for Virtual Visits (Telemedicine).  Patients are able to view lab/test results, encounter notes, upcoming appointments, etc.  Non-urgent messages can be sent to your provider as well.   To learn more about what you can do with MyChart, go to [email or web address].

## 2023-12-25 LAB — BASIC METABOLIC PANEL WITH GFR
BUN/Creatinine Ratio: 17 (ref 10–24)
BUN: 15 mg/dL (ref 8–27)
CO2: 27 mmol/L (ref 20–29)
Calcium: 9.1 mg/dL (ref 8.6–10.2)
Chloride: 99 mmol/L (ref 96–106)
Creatinine, Ser: 0.86 mg/dL (ref 0.76–1.27)
Glucose: 91 mg/dL (ref 70–99)
Potassium: 4.5 mmol/L (ref 3.5–5.2)
Sodium: 141 mmol/L (ref 134–144)
eGFR: 85 mL/min/{1.73_m2} (ref >59)

## 2023-12-25 LAB — MAGNESIUM: Magnesium: 2.1 mg/dL (ref 1.6–2.3)

## 2023-12-27 ENCOUNTER — Encounter: Payer: Self-pay | Admitting: Cardiology

## 2024-01-02 ENCOUNTER — Ambulatory Visit: Admitting: Cardiology

## 2024-01-05 ENCOUNTER — Ambulatory Visit: Attending: Cardiology

## 2024-01-05 DIAGNOSIS — Z95810 Presence of automatic (implantable) cardiac defibrillator: Secondary | ICD-10-CM | POA: Diagnosis not present

## 2024-01-05 DIAGNOSIS — I5022 Chronic systolic (congestive) heart failure: Secondary | ICD-10-CM

## 2024-01-06 NOTE — Progress Notes (Signed)
 EPIC Encounter for ICM Monitoring  Patient Name: Arcadio Cope is a 83 y.o. male Date: 01/06/2024 Primary Care Physican: Macie Saxon, MD Primary Cardiologist: Alvenia Aus Electrophysiologist: Kasandra Pain Pacing:  95%           03/19/2023 Weight: 198 - 200 lbs 07/02/2023 Weight: 200 lbs 07/09/2023 Weight: 200 lbs      08/08/2023 Weight: 200 lbs     10/29/2023 Weight: 198 lbs   12/03/2023 Weight: 196-197 lbs  01/06/2024 Weight: 195 lbs                                            Spoke with patient and heart failure questions reviewed.  Transmission results reviewed.  Pt asymptomatic for fluid accumulation except for sinus congestion.  BP was low at MD office yesterday.   Corvue Thoracic impedance suggesting possible dryness 4/9.  Suggesting possible fluid accumulation from 3/17-3/24.      Prescribed:  Furosemide 20 mg take 1 tablet daily as needed for weight gain of 2 lbs or greater (takes on average once a month).    Recommendations:  He increased fluid intake 4/15 and today, 4/16 to help with hydration.   Follow-up plan: ICM clinic phone appointment on 01/12/2024 to recheck fluid levels.   91 day device clinic remote transmission 02/01/2024.    EP/Cardiology Office Visits:   Recall 06/21/2024 with Dr Marven Slimmer.   03/25/2024 with Dr Alvenia Aus (6 month f/u).   Copy of ICM check sent to Dr. Marven Slimmer.   3 month ICM trend: 01/05/2024.    12-14 Month ICM trend:     Almyra Jain, RN 01/06/2024 3:11 PM

## 2024-01-12 ENCOUNTER — Ambulatory Visit: Attending: Cardiology

## 2024-01-12 DIAGNOSIS — I5022 Chronic systolic (congestive) heart failure: Secondary | ICD-10-CM

## 2024-01-12 DIAGNOSIS — Z95810 Presence of automatic (implantable) cardiac defibrillator: Secondary | ICD-10-CM

## 2024-01-12 NOTE — Progress Notes (Signed)
 EPIC Encounter for ICM Monitoring  Patient Name: Benjamin Frost is a 83 y.o. male Date: 01/12/2024 Primary Care Physican: Macie Saxon, MD Primary Cardiologist: Alvenia Aus Electrophysiologist: Kasandra Pain Pacing:  96%           03/19/2023 Weight: 198 - 200 lbs 07/02/2023 Weight: 200 lbs 07/09/2023 Weight: 200 lbs      08/08/2023 Weight: 200 lbs     10/29/2023 Weight: 198 lbs   12/03/2023 Weight: 196-197 lbs  01/06/2024 Weight: 195 lbs 01/12/2024 Weight: 195 lbs                                            Spoke with patient and heart failure questions reviewed.  Transmission results reviewed.  Pt asymptomatic for fluid accumulation.   Diet: Discussed limiting salt intake to 2000 mg daily and consistent with fluid intake of 64 oz daily.  He is unsure how much fluid or salt he takes daily.    Corvue Thoracic impedance suggesting  possible dryness 4/9-4/16 followed by possible fluid accumulation started 4/17.      Prescribed:  Furosemide  20 mg take 1 tablet daily as needed for weight gain of 2 lbs or greater (takes on average once a month).    Recommendations:  He will take 1 PRN Furosemide  20 mg x 1 day.   Follow-up plan: ICM clinic phone appointment on 01/26/2024 to recheck fluid levels.   91 day device clinic remote transmission 02/01/2024.    EP/Cardiology Office Visits:   Recall 06/21/2024 with Dr Marven Slimmer.   03/25/2024 with Dr Alvenia Aus (6 month f/u).   Copy of ICM check sent to Dr. Marven Slimmer.   3 month ICM trend: 01/12/2024.    12-14 Month ICM trend:     Almyra Jain, RN 01/12/2024 1:16 PM

## 2024-01-26 ENCOUNTER — Ambulatory Visit: Attending: Cardiology

## 2024-01-26 DIAGNOSIS — I5022 Chronic systolic (congestive) heart failure: Secondary | ICD-10-CM

## 2024-01-26 DIAGNOSIS — Z95810 Presence of automatic (implantable) cardiac defibrillator: Secondary | ICD-10-CM

## 2024-01-26 NOTE — Progress Notes (Signed)
 EPIC Encounter for ICM Monitoring  Patient Name: Benjamin Frost is a 83 y.o. male Date: 01/26/2024 Primary Care Physican: Macie Saxon, MD Primary Cardiologist: Alvenia Aus Electrophysiologist: Kasandra Pain Pacing:  96%           03/19/2023 Weight: 198 - 200 lbs 07/02/2023 Weight: 200 lbs 07/09/2023 Weight: 200 lbs      08/08/2023 Weight: 200 lbs     10/29/2023 Weight: 198 lbs   12/03/2023 Weight: 196-197 lbs  01/06/2024 Weight: 195 lbs 01/12/2024 Weight: 195 lbs     01/26/2024 Weight: 195 lbs                                        Spoke with patient and heart failure questions reviewed.  Transmission results reviewed.  Pt asymptomatic for fluid accumulation.   Diet: Discussed limiting salt intake to 2000 mg daily and consistent with fluid intake of 64 oz daily.  He is unsure how much fluid or salt he takes daily.     Corvue Thoracic impedance suggesting fluid levels returned close to baseline 5/5.  Impedance suggesting patterns of fluid accumulation alternating with dryness over the last 6 months.      Prescribed:  Furosemide  20 mg take 1 tablet daily as needed for weight gain of 2 lbs or greater (takes on average once a month).    Recommendations:   No changes and encouraged to call if experiencing any fluid symptoms.   Follow-up plan: ICM clinic phone appointment on 02/17/2024.   91 day device clinic remote transmission 02/02/2024.    EP/Cardiology Office Visits:   Recall 06/21/2024 with Dr Marven Slimmer.   03/25/2024 with Dr Alvenia Aus (6 month f/u).   Copy of ICM check sent to Dr. Marven Slimmer.   3 month ICM trend: 01/26/2024.    12-14 Month ICM trend:     Almyra Jain, RN 01/26/2024 2:59 PM

## 2024-02-02 ENCOUNTER — Ambulatory Visit (INDEPENDENT_AMBULATORY_CARE_PROVIDER_SITE_OTHER): Payer: Medicare Other

## 2024-02-02 DIAGNOSIS — I5022 Chronic systolic (congestive) heart failure: Secondary | ICD-10-CM

## 2024-02-02 DIAGNOSIS — I428 Other cardiomyopathies: Secondary | ICD-10-CM

## 2024-02-03 LAB — CUP PACEART REMOTE DEVICE CHECK
Battery Remaining Longevity: 10 mo
Battery Remaining Percentage: 11 %
Battery Voltage: 2.69 V
Brady Statistic AP VP Percent: 1.5 %
Brady Statistic AP VS Percent: 1 %
Brady Statistic AS VP Percent: 95 %
Brady Statistic AS VS Percent: 1.8 %
Brady Statistic RA Percent Paced: 1 %
Date Time Interrogation Session: 20250512040017
HighPow Impedance: 88 Ohm
HighPow Impedance: 88 Ohm
Implantable Lead Connection Status: 753985
Implantable Lead Connection Status: 753985
Implantable Lead Connection Status: 753985
Implantable Lead Implant Date: 20180906
Implantable Lead Implant Date: 20180906
Implantable Lead Implant Date: 20180906
Implantable Lead Location: 753858
Implantable Lead Location: 753859
Implantable Lead Location: 753860
Implantable Pulse Generator Implant Date: 20180906
Lead Channel Impedance Value: 1225 Ohm
Lead Channel Impedance Value: 530 Ohm
Lead Channel Impedance Value: 540 Ohm
Lead Channel Pacing Threshold Amplitude: 0.5 V
Lead Channel Pacing Threshold Amplitude: 0.5 V
Lead Channel Pacing Threshold Amplitude: 2.375 V
Lead Channel Pacing Threshold Pulse Width: 0.4 ms
Lead Channel Pacing Threshold Pulse Width: 0.5 ms
Lead Channel Pacing Threshold Pulse Width: 1 ms
Lead Channel Sensing Intrinsic Amplitude: 12 mV
Lead Channel Sensing Intrinsic Amplitude: 4.2 mV
Lead Channel Setting Pacing Amplitude: 1.5 V
Lead Channel Setting Pacing Amplitude: 2 V
Lead Channel Setting Pacing Amplitude: 2.625
Lead Channel Setting Pacing Pulse Width: 0.5 ms
Lead Channel Setting Pacing Pulse Width: 1 ms
Lead Channel Setting Sensing Sensitivity: 0.5 mV
Pulse Gen Serial Number: 7421078

## 2024-02-08 ENCOUNTER — Ambulatory Visit: Payer: Self-pay | Admitting: Cardiology

## 2024-02-17 ENCOUNTER — Ambulatory Visit: Attending: Cardiology

## 2024-02-17 DIAGNOSIS — I5022 Chronic systolic (congestive) heart failure: Secondary | ICD-10-CM

## 2024-02-17 DIAGNOSIS — Z95810 Presence of automatic (implantable) cardiac defibrillator: Secondary | ICD-10-CM | POA: Diagnosis not present

## 2024-02-19 NOTE — Progress Notes (Signed)
 EPIC Encounter for ICM Monitoring  Patient Name: Benjamin Frost is a 83 y.o. male Date: 02/19/2024 Primary Care Physican: Macie Saxon, MD Primary Cardiologist: Alvenia Aus Electrophysiologist: Kasandra Pain Pacing:  96%           12/03/2023 Weight: 196-197 lbs  01/06/2024 Weight: 195 lbs 01/12/2024 Weight: 195 lbs     01/26/2024 Weight: 195 lbs   02/19/2024 Weight: 195 lbs                                      Spoke with patient and heart failure questions reviewed.  Transmission results reviewed.  Pt asymptomatic for fluid accumulation.     Diet: Discussed limiting salt intake to 2000 mg daily and consistent with fluid intake of 64 oz daily.  He is unsure how much fluid or salt he takes daily.     Corvue Thoracic impedance suggesting possible fluid accumulation from 5/16-5/23 and returned to 5/24.  Impedance suggesting patterns of fluid accumulation alternating with dryness over the last 6 months.      Prescribed:  Furosemide  20 mg take 1 tablet daily as needed for weight gain of 2 lbs or greater (takes on average once a month).    Recommendations:   No changes and encouraged to call if experiencing any fluid symptoms.   Follow-up plan: ICM clinic phone appointment on 04/05/2024.   91 day device clinic remote transmission 05/03/2024.    EP/Cardiology Office Visits:   Recall 06/21/2024 with Dr Marven Slimmer.   03/25/2024 with Dr Alvenia Aus (6 month f/u).   Copy of ICM check sent to Dr. Marven Slimmer.   3 month ICM trend: 02/17/2024.    12-14 Month ICM trend:     Almyra Jain, RN 02/19/2024 1:57 PM

## 2024-03-18 NOTE — Progress Notes (Signed)
 Remote ICD transmission.

## 2024-03-18 NOTE — Addendum Note (Signed)
 Addended by: TAWNI DRILLING D on: 03/18/2024 10:06 AM   Modules accepted: Orders

## 2024-03-24 ENCOUNTER — Other Ambulatory Visit: Payer: Self-pay | Admitting: Cardiovascular Disease

## 2024-03-25 ENCOUNTER — Encounter: Payer: Self-pay | Admitting: Cardiovascular Disease

## 2024-03-25 ENCOUNTER — Ambulatory Visit: Attending: Cardiovascular Disease | Admitting: Cardiovascular Disease

## 2024-03-25 VITALS — BP 100/60 | HR 71 | Ht 69.0 in | Wt 202.4 lb

## 2024-03-25 DIAGNOSIS — Z952 Presence of prosthetic heart valve: Secondary | ICD-10-CM | POA: Diagnosis not present

## 2024-03-25 DIAGNOSIS — Z95810 Presence of automatic (implantable) cardiac defibrillator: Secondary | ICD-10-CM

## 2024-03-25 DIAGNOSIS — I5022 Chronic systolic (congestive) heart failure: Secondary | ICD-10-CM | POA: Diagnosis not present

## 2024-03-25 DIAGNOSIS — E785 Hyperlipidemia, unspecified: Secondary | ICD-10-CM | POA: Diagnosis not present

## 2024-03-25 NOTE — Progress Notes (Signed)
 Cardiology Office Note   Date:  03/25/2024   ID:  Benjamin Frost, DOB 05/02/1941, MRN [id number]  PCP:  Buren Rock HERO, MD  Cardiologist:   Deatrice Cage, MD   Chief Complaint  Patient presents with   Follow-up    3 month f/u no complaints today. Meds reviewed verbally with pt.      History of Present Illness: Benjamin Frost is a 83 y.o. male who presents for a followup visit regarding chronic systolic heart failure with severely reduced LV systolic function, aortic stenosis and an underlying left bundle branch block. Previous ejection fraction was 10-15% in 2015. Right and  left cardiac catheterization in July, 2015 showed only mildly elevated filling pressures with normal cardiac output and no significant pulmonary hypertension. Coronary angiography showed no obstructive coronary artery disease. He had significant sinus tachycardia that improved significantly with Ivabradine .  He is status post ICD CRT placement.  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   Most recent echocardiogram in February 2020 showed an EF of 40 to 45%, mild biatrial enlargement, normal functioning TAVR prosthesis with mean gradient of 15 mmHg.  During last visit, I added Farxiga .  He has been doing well with no chest pain, shortness of breath or worsening lower extremity edema.  He takes furosemide  as needed.  Past Medical History:  Diagnosis Date   AICD (automatic cardioverter/defibrillator) present    a. St Jude, placed after VF arrest during a dobuatamine echo.    Aortic stenosis, severe    a. 11/04/17: s/p TAVR by Dr. Wonda and Dr. Lucas; b. 10/2018 Echo: 26mm Edwards Sapien bioprosthetic AoV. Mean grad (down from on prior study).   Chronic systolic heart failure (HCC)    a. 2015 EF 10-15%; b. 10/2018 Echo: EF 40-45%, nl RV fxn. Mildly dil LA.   COPD (chronic obstructive pulmonary disease) (HCC)    GERD (gastroesophageal reflux disease)    History of BPH    History of  kidney stones    Hyperlipidemia    Hypertension    Left bundle branch block    NICM (nonischemic cardiomyopathy) (HCC)    a. 2015 EF 10-15%; b. 03/2014 cath: nl cors; c. S/p SJM CRT-D; d. 10/2018 Echo: EF 40-45%.   S/P TAVR (transcatheter aortic valve replacement)    a. 10/2017: s/p TAVR with an Edwards Sapien 3 THV (size 26 mm, model # 9600TFX, serial # [id number])   Sinus tachycardia     Past Surgical History:  Procedure Laterality Date   BIV ICD INSERTION CRT-D N/A 05/29/2017   SJM Quadra Assura implanted by Dr Kelsie for secondary prevention of sudden death   CARDIAC CATHETERIZATION  2012   [facility]   CARDIAC CATHETERIZATION     [facility]   CATARACT EXTRACTION, BILATERAL     LEFT AND RIGHT HEART CATHETERIZATION WITH CORONARY ANGIOGRAM N/A 03/23/2014   Procedure: LEFT AND RIGHT HEART CATHETERIZATION WITH CORONARY ANGIOGRAM;  Surgeon: Deatrice DELENA Cage, MD;  Location: [facility] CATH LAB;  Service: Cardiovascular;  Laterality: N/A;   LEG SURGERY Left    surgery d/t injury from nail gun   RIGHT/LEFT HEART CATH AND CORONARY ANGIOGRAPHY N/A 05/16/2017   Procedure: RIGHT/LEFT HEART CATH AND CORONARY ANGIOGRAPHY;  Surgeon: Mady Bruckner, MD;  Location: [facility] INVASIVE CV LAB;  Service: Cardiovascular;  Laterality: N/A;   TEE WITHOUT CARDIOVERSION N/A 11/04/2017   Procedure: TRANSESOPHAGEAL ECHOCARDIOGRAM (TEE);  Surgeon: Wonda Sharper, MD;  Location: [facility] OR;  Service: Open Heart Surgery;  Laterality:  N/A;   TRANSCATHETER AORTIC VALVE REPLACEMENT, TRANSFEMORAL N/A 11/04/2017   Procedure: TRANSCATHETER AORTIC VALVE REPLACEMENT, TRANSFEMORAL;  Surgeon: Wonda Sharper, MD;  Location: [facility] OR;  Service: Open Heart Surgery;  Laterality: N/A;     Current Outpatient Medications  Medication Sig Dispense Refill   albuterol  (VENTOLIN  HFA) 108 (90 Base) MCG/ACT inhaler Inhale 2 puffs into the lungs every 6 (six) hours as needed for wheezing or shortness of breath.     amoxicillin  (AMOXIL ) 500 MG tablet Take 2,000 mg (four 500 mg  tablets) one hour prior to dental appointments. 4 tablet 6   aspirin  81 MG tablet Take 81 mg by mouth daily.     atorvastatin  (LIPITOR) 40 MG tablet Take 40 mg by mouth daily.     carvedilol  (COREG ) 6.25 MG tablet Take 1 tablet (6.25 mg total) by mouth 2 (two) times daily. 180 tablet 1   cetirizine (ZYRTEC) 10 MG tablet Take 10 mg by mouth daily.     DULERA  200-5 MCG/ACT AERO Inhale 2 puffs into the lungs 2 (two) times daily.     FARXIGA  10 MG TABS tablet Take 1 tablet (10 mg total) by mouth daily before breakfast. 90 tablet 2   fluticasone (FLONASE) 50 MCG/ACT nasal spray Place 1 spray into both nostrils daily.     Fluticasone-Salmeterol (ADVAIR) 250-50 MCG/DOSE AEPB Inhale 1 puff into the lungs 2 (two) times daily.     furosemide  (LASIX ) 20 MG tablet Take 1 tablet (20 mg total) by mouth daily as needed. For weight gain greater than 2 lb. 30 tablet 3   losartan  (COZAAR ) 25 MG tablet Take 1 tablet (25 mg total) by mouth daily. 30 tablet 3   pantoprazole  (PROTONIX ) 40 MG tablet Take 1 tablet (40 mg total) by mouth daily. 30 tablet 0   spironolactone  (ALDACTONE ) 25 MG tablet Take 1 tablet (25 mg total) by mouth daily. 30 tablet 0   tamsulosin  (FLOMAX ) 0.4 MG CAPS capsule Take 0.4 mg by mouth daily.     tiotropium (SPIRIVA ) 18 MCG inhalation capsule Place 18 mcg into inhaler and inhale daily.     No current facility-administered medications for this visit.    Allergies:   Lisinopril, Dobutamine, Amlodipine, and Metoprolol     Social History:  The patient  reports that he quit smoking about 33 years ago. His smoking use included cigarettes. He started smoking about 33 years ago. He has quit using smokeless tobacco.  His smokeless tobacco use included snuff. He reports current alcohol use. He reports that he does not use drugs.   Family History:  The patient's family history includes Heart disease in his father; Heart failure in his father; Hyperlipidemia in his mother; Hypertension in his mother.     ROS:  Please see the history of present illness.   Otherwise, review of systems are positive for none.   All other systems are reviewed and negative.    PHYSICAL EXAM: VS:  BP 100/60 (BP Location: Left Arm, Patient Position: Sitting, Cuff Size: Normal)   Pulse 71   Ht 5' 9 (1.753 m)   Wt 202 lb 6 oz (91.8 kg)   SpO2 95%   BMI 29.89 kg/m  , BMI Body mass index is 29.89 kg/m. GEN: Well nourished, well developed, in no acute distress  HEENT: normal  Neck: no JVD, carotid bruits, or masses Cardiac: RRR; no  rubs, or gallops,no edema . There is 1/6 systolic murmur in the aortic area . Respiratory: Clear to auscultation, normal work of  breathing GI: soft, nontender, nondistended, + BS MS: no deformity or atrophy  Skin: warm and dry, no rash Neuro:  Strength and sensation are intact Psych: euthymic mood, full affect   EKG:  EKG is ordered today. The ekg ordered today demonstrates atrial sensed ventricular paced rhythm .   Recent Labs: 12/24/2023: BUN 15; Creatinine, Ser 0.86; Magnesium  2.1; Potassium 4.5; Sodium 141    Lipid Panel    Component Value Date/Time   CHOL 156 06/04/2018 1116   CHOL 117 02/28/2014 0442   TRIG 120 06/04/2018 1116   TRIG 65 02/28/2014 0442   HDL 57 06/04/2018 1116   HDL 44 02/28/2014 0442   CHOLHDL 2.7 06/04/2018 1116   VLDL 13 02/28/2014 0442   LDLCALC 75 06/04/2018 1116   LDLCALC 60 02/28/2014 0442      Wt Readings from Last 3 Encounters:  03/25/24 202 lb 6 oz (91.8 kg)  12/24/23 206 lb 6.4 oz (93.6 kg)  06/25/23 210 lb 6 oz (95.4 kg)       ASSESSMENT AND PLAN:  1.  Chronic systolic heart failure: Due to nonischemic cardiomyopathy.  He is currently [place]  Heart Association class II.  Most recent ejection fraction was 40 to 45%. Continue treatment with carvedilol , Farxiga , losartan  and spironolactone .  Blood pressure does not allow transitioning to Entresto.  2.  Status post TAVR  for severe aortic stenosis: Stable.  Most recent  echo showed normal functioning prosthesis.  Continue aspirin .  3. Hyperlipidemia: Currently on atorvastatin  40 mg daily.  He had lipid profile done last month which showed an LDL of 84 and triglyceride of 61.  4.  Status post ICD CRT: Followed by the EP clinic.  EKG today shows normal functioning pacemaker.  Schedule a follow-up appointment with Dr. Cindie to discuss GEN change given battery life of 10 months.  5.  COPD: Seems to be stable.   Disposition:   FU with me in 9 months  Signed,  Deatrice Cage, MD  03/25/2024 10:06 AM    [facility]

## 2024-03-25 NOTE — Patient Instructions (Signed)
 Medication Instructions:  No changes *If you need a refill on your cardiac medications before your next appointment, please call your pharmacy*  Lab Work: None ordered If you have labs (blood work) drawn today and your tests are completely normal, you will receive your results only by: MyChart Message (if you have MyChart) OR A paper copy in the mail If you have any lab test that is abnormal or we need to change your treatment, we will call you to review the results.  Testing/Procedures: None ordered  Follow-Up: At [facility], you and your health needs are our priority.  As part of our continuing mission to provide you with exceptional heart care, our providers are all part of one team.  This team includes your primary Cardiologist (physician) and Advanced Practice Providers or APPs (Physician Assistants and Nurse Practitioners) who all work together to provide you with the care you need, when you need it.  Your next appointment:   9 month(s)  Provider:   You may see Deatrice Cage, MD or one of the following Advanced Practice Providers on your designated Care Team:   Lonni Meager, NP Lesley Maffucci, PA-C Bernardino Bring, PA-C Cadence [place], PA-C Tylene Lunch, NP Barnie Hila, NP    Follow up with Dr. Cindie in September

## 2024-04-05 ENCOUNTER — Ambulatory Visit: Attending: Cardiology

## 2024-04-05 DIAGNOSIS — I5022 Chronic systolic (congestive) heart failure: Secondary | ICD-10-CM

## 2024-04-05 DIAGNOSIS — Z95810 Presence of automatic (implantable) cardiac defibrillator: Secondary | ICD-10-CM | POA: Diagnosis not present

## 2024-04-09 NOTE — Progress Notes (Signed)
 EPIC Encounter for ICM Monitoring  Patient Name: Benjamin Frost is a 83 y.o. male Date: 04/09/2024 Primary Care Physican: Buren Rock HERO, MD Primary Cardiologist: Darron Electrophysiologist: Cindie Pore Pacing:  97%           12/03/2023 Weight: 196-197 lbs  01/06/2024 Weight: 195 lbs 01/12/2024 Weight: 195 lbs     01/26/2024 Weight: 195 lbs   02/19/2024 Weight: 195 lbs   04/09/2024 Weight: 195 lbs  Battery Longevity: 5.7 months                                    Spoke with patient and heart failure questions reviewed.  Transmission results reviewed.  Pt asymptomatic for fluid accumulation.  He took PRN Lasix  on 7/9 because he felt like he had fluid.    Diet: Discussed limiting salt intake to 2000 mg daily and consistent with fluid intake of 64 oz daily.  He is unsure how much fluid or salt he takes daily.     Corvue Thoracic impedance suggesting normal fluid levels with the exception of possible fluid accumulation from 6/25-7/5.     Prescribed:  Furosemide  20 mg take 1 tablet daily as needed for weight gain of 2 lbs or greater (takes on average once a month).    Recommendations:   No changes and encouraged to call if experiencing any fluid symptoms.   Follow-up plan: ICM clinic phone appointment on 05/10/2024.   91 day device clinic remote transmission 05/03/2024.    EP/Cardiology Office Visits:   06/09/2024 with Dr Cindie.   Recall 12/20/2024 with Dr Darron (6 month f/u).   Copy of ICM check sent to Dr. Cindie.   3 month ICM trend: 04/05/2024.    12-14 Month ICM trend:     [name], RN 04/09/2024 10:16 AM

## 2024-05-03 ENCOUNTER — Ambulatory Visit: Payer: Medicare Other

## 2024-05-03 DIAGNOSIS — I5022 Chronic systolic (congestive) heart failure: Secondary | ICD-10-CM | POA: Diagnosis not present

## 2024-05-03 LAB — CUP PACEART REMOTE DEVICE CHECK
Battery Remaining Longevity: 6 mo
Battery Remaining Percentage: 7 %
Battery Voltage: 2.65 V
Brady Statistic AP VP Percent: 1 %
Brady Statistic AP VS Percent: 1 %
Brady Statistic AS VP Percent: 97 %
Brady Statistic AS VS Percent: 1.5 %
Brady Statistic RA Percent Paced: 1 %
Date Time Interrogation Session: 20250811040016
HighPow Impedance: 93 Ohm
HighPow Impedance: 93 Ohm
Implantable Lead Connection Status: 753985
Implantable Lead Connection Status: 753985
Implantable Lead Connection Status: 753985
Implantable Lead Implant Date: 20180906
Implantable Lead Implant Date: 20180906
Implantable Lead Implant Date: 20180906
Implantable Lead Location: 753858
Implantable Lead Location: 753859
Implantable Lead Location: 753860
Implantable Pulse Generator Implant Date: 20180906
Lead Channel Impedance Value: 1150 Ohm
Lead Channel Impedance Value: 530 Ohm
Lead Channel Impedance Value: 540 Ohm
Lead Channel Pacing Threshold Amplitude: 0.5 V
Lead Channel Pacing Threshold Amplitude: 0.625 V
Lead Channel Pacing Threshold Amplitude: 2 V
Lead Channel Pacing Threshold Pulse Width: 0.4 ms
Lead Channel Pacing Threshold Pulse Width: 0.5 ms
Lead Channel Pacing Threshold Pulse Width: 1 ms
Lead Channel Sensing Intrinsic Amplitude: 11.7 mV
Lead Channel Sensing Intrinsic Amplitude: 3.6 mV
Lead Channel Setting Pacing Amplitude: 1.625
Lead Channel Setting Pacing Amplitude: 2 V
Lead Channel Setting Pacing Amplitude: 2.25 V
Lead Channel Setting Pacing Pulse Width: 0.5 ms
Lead Channel Setting Pacing Pulse Width: 1 ms
Lead Channel Setting Sensing Sensitivity: 0.5 mV
Pulse Gen Serial Number: 7421078

## 2024-05-04 ENCOUNTER — Ambulatory Visit: Payer: Self-pay | Admitting: Cardiology

## 2024-05-10 ENCOUNTER — Ambulatory Visit: Attending: Cardiology

## 2024-05-10 DIAGNOSIS — Z95810 Presence of automatic (implantable) cardiac defibrillator: Secondary | ICD-10-CM

## 2024-05-10 DIAGNOSIS — I5022 Chronic systolic (congestive) heart failure: Secondary | ICD-10-CM | POA: Diagnosis not present

## 2024-05-10 NOTE — Progress Notes (Unsigned)
 EPIC Encounter for ICM Monitoring  Patient Name: Benjamin Frost is a 83 y.o. male Date: 05/10/2024 Primary Care Physican: Buren Rock HERO, MD Primary Cardiologist: Darron Electrophysiologist: Cindie Pore Pacing:  97%           12/03/2023 Weight: 196-197 lbs  01/06/2024 Weight: 195 lbs 01/12/2024 Weight: 195 lbs     01/26/2024 Weight: 195 lbs   02/19/2024 Weight: 195 lbs   04/09/2024 Weight: 195 lbs 05/11/2024 Weight: 195 lbs   Battery Longevity: 6.7 months                                    Spoke with patient and heart failure questions reviewed.  Transmission results reviewed.  Pt felt SOB yesterday 8/18 and took PRN Lasix  last night with good urine output.  First PRN dosage in the last month.  Feeling fine today and doing well.     Diet: Discussed limiting salt intake to 2000 mg daily and consistent with fluid intake of 64 oz daily.  He is unsure how much fluid or salt he takes daily.     Corvue Thoracic impedance suggesting normal fluid levels with the exception of possible fluid accumulation starting 8/14 and trending back toward baseline.     Prescribed:  Furosemide  20 mg take 1 tablet daily as needed for weight gain of 2 lbs or greater (takes on average once a month).    Recommendations:   No changes and encouraged to call if experiencing any fluid symptoms.   Follow-up plan: ICM clinic phone appointment on 06/14/2024.   91 day device clinic remote transmission 08/02/2024.    EP/Cardiology Office Visits:  06/09/2024 with Dr Cindie.   Recall 12/20/2024 with Dr Darron (6 month f/u).   Copy of ICM check sent to Dr. Cindie.   3 month ICM trend: 05/10/2024.    12-14 Month ICM trend:     [name], RN 05/10/2024 11:53 AM

## 2024-05-20 ENCOUNTER — Emergency Department
Admission: EM | Admit: 2024-05-20 | Discharge: 2024-05-21 | Disposition: A | Discharge status: Home / Self Care | Attending: Emergency Medicine | Admitting: Emergency Medicine

## 2024-05-20 ENCOUNTER — Emergency Department

## 2024-05-20 ENCOUNTER — Telehealth: Payer: Self-pay | Admitting: Student

## 2024-05-20 ENCOUNTER — Other Ambulatory Visit: Payer: Self-pay

## 2024-05-20 DIAGNOSIS — R002 Palpitations: Secondary | ICD-10-CM | POA: Diagnosis present

## 2024-05-20 DIAGNOSIS — Z95810 Presence of automatic (implantable) cardiac defibrillator: Secondary | ICD-10-CM | POA: Insufficient documentation

## 2024-05-20 DIAGNOSIS — I10 Essential (primary) hypertension: Secondary | ICD-10-CM | POA: Insufficient documentation

## 2024-05-20 DIAGNOSIS — M79661 Pain in right lower leg: Secondary | ICD-10-CM | POA: Insufficient documentation

## 2024-05-20 DIAGNOSIS — J449 Chronic obstructive pulmonary disease, unspecified: Secondary | ICD-10-CM | POA: Diagnosis not present

## 2024-05-20 LAB — BASIC METABOLIC PANEL WITH GFR
Anion gap: 9 (ref 5–15)
BUN: 19 mg/dL (ref 8–23)
CO2: 30 mmol/L (ref 22–32)
Calcium: 9.4 mg/dL (ref 8.9–10.3)
Chloride: 99 mmol/L (ref 98–111)
Creatinine, Ser: 0.84 mg/dL (ref 0.61–1.24)
GFR, Estimated: >60 mL/min (ref >60)
Glucose, Bld: 110 mg/dL — ABNORMAL HIGH (ref 70–99)
Potassium: 3.6 mmol/L (ref 3.5–5.1)
Sodium: 138 mmol/L (ref 135–145)

## 2024-05-20 LAB — MAGNESIUM: Magnesium: 2 mg/dL (ref 1.7–2.4)

## 2024-05-20 LAB — CBC WITH DIFFERENTIAL/PLATELET
Abs Immature Granulocytes: 0.02 K/uL (ref 0.00–0.07)
Basophils Absolute: 0.1 K/uL (ref 0.0–0.1)
Basophils Relative: 1 %
Eosinophils Absolute: 0.2 K/uL (ref 0.0–0.5)
Eosinophils Relative: 2 %
HCT: 46.7 % (ref 39.0–52.0)
Hemoglobin: 15.3 g/dL (ref 13.0–17.0)
Immature Granulocytes: 0 %
Lymphocytes Relative: 30 %
Lymphs Abs: 2.3 K/uL (ref 0.7–4.0)
MCH: 32.5 pg (ref 26.0–34.0)
MCHC: 32.8 g/dL (ref 30.0–36.0)
MCV: 99.2 fL (ref 80.0–100.0)
Monocytes Absolute: 0.7 K/uL (ref 0.1–1.0)
Monocytes Relative: 9 %
Neutro Abs: 4.4 K/uL (ref 1.7–7.7)
Neutrophils Relative %: 58 %
Platelets: 153 K/uL (ref 150–400)
RBC: 4.71 MIL/uL (ref 4.22–5.81)
RDW: 13.1 % (ref 11.5–15.5)
WBC: 7.5 K/uL (ref 4.0–10.5)
nRBC: 0 % (ref 0.0–0.2)

## 2024-05-20 LAB — TROPONIN I (HIGH SENSITIVITY): Troponin I (High Sensitivity): 9 ng/L (ref <18)

## 2024-05-20 LAB — D-DIMER, QUANTITATIVE: D-Dimer, Quant: 0.78 ug{FEU}/mL — ABNORMAL HIGH (ref 0.00–0.50)

## 2024-05-20 NOTE — ED Provider Notes (Signed)
 [facility] Provider Note    Event Date/Time   First MD Initiated Contact with Patient 05/20/24 2303     (approximate)   History   Palpitations   HPI  Benjamin Frost is a 83 y.o. male   Past medical history of COPD, TAVR, AICD placement, hypertension hyperlipidemia, here with palpitations.  Also atraumatic right lower leg bruising and pain.  Not on blood thinner.  No shortness of breath or chest pain.  Baseline COPD, unchanged chronic respiratory symptoms.  Noticed some bruising to the right calf.  He has been taking his diuretics recently to try to decrease the swelling in bilateral lower extremities.    Independent Historian contributed to assessment above: Wife at bedside corroborates information above  External Medical Documents Reviewed: Cardiology note from recent visit notingRemote Defibrillator interrogation reviewed. Presenting Rhythm:A-sensed V-paced. Battery and lead parameters stable with stable capture and sensing. Device programming is appropriate. No ventricular arrhythmias noted. Continue remote monitoring.   Note obtained 05/10/2024 noting - Battery Longevity: 6.7 months                                       Physical Exam   Triage Vital Signs: ED Triage Vitals  Encounter Vitals Group     BP 05/20/24 2024 137/88     Girls Systolic BP Percentile --      Girls Diastolic BP Percentile --      Boys Systolic BP Percentile --      Boys Diastolic BP Percentile --      Pulse Rate 05/20/24 2024 96     Resp 05/20/24 2024 18     Temp 05/20/24 2024 99.1 F (37.3 C)     Temp Source 05/20/24 2024 Oral     SpO2 05/20/24 2024 96 %     Weight 05/20/24 2023 195 lb (88.5 kg)     Height 05/20/24 2023 5' 9 (1.753 m)     Head Circumference --      Peak Flow --      Pain Score 05/20/24 2023 3     Pain Loc --      Pain Education --      Exclude from Growth Chart --     Most recent vital signs: Vitals:   05/20/24 2024 05/21/24 0100  BP:  137/88 121/73  Pulse: 96 89  Resp: 18 16  Temp: 99.1 F (37.3 C) 98.1 F (36.7 C)  SpO2: 96% 98%    General: Awake, no distress.  CV:  Good peripheral perfusion.  Resp:  Normal effort.  Abd:  No distention.  Other:  Bruising and tenderness to the right calf.  No significant swelling compared to the left.  Warm and well-perfused bilateral lower extremities   ED Results / Procedures / Treatments   Labs (all labs ordered are listed, but only abnormal results are displayed) Labs Reviewed  BASIC METABOLIC PANEL WITH GFR - Abnormal; Notable for the following components:      Result Value   Glucose, Bld 110 (*)    All other components within normal limits  D-DIMER, QUANTITATIVE - Abnormal; Notable for the following components:   D-Dimer, Quant 0.78 (*)    All other components within normal limits  CBC WITH DIFFERENTIAL/PLATELET  MAGNESIUM   TROPONIN I (HIGH SENSITIVITY)     I ordered and reviewed the above labs they are notable for initial troponin within normal  limits, cell counts electrolytes unremarkable.  Age-adjusted D-dimer within normal limit.  EKG  ED ECG REPORT I, Ginnie Shams, the attending physician, personally viewed and interpreted this ECG.   Date: 05/20/2024  EKG Time: 2019  Rate: 90  Rhythm: a sensed v paced,  Axis: nl  Intervals:nl  ST&T Change: no stemi    RADIOLOGY I independently reviewed and interpreted chest x-ray and see no obvious focality pneumothorax I also reviewed radiologist's formal read.   PROCEDURES:  Critical Care performed: No  Procedures   MEDICATIONS ORDERED IN ED: Medications - No data to display  IMPRESSION / MDM / ASSESSMENT AND PLAN / ED COURSE  I reviewed the triage vital signs and the nursing notes.                                Patient's presentation is most consistent with acute presentation with potential threat to life or bodily function.  Differential diagnosis includes, but is not limited to, dysrhythmia,  electrolyte derangement, ACS, PE, DVT, pacemaker dysfunction    The patient is on the cardiac monitor to evaluate for evidence of arrhythmia and/or significant heart rate changes.  MDM:    In terms of his palpitations, intermittent, none currently, and not associated with acute shortness of breath, respiratory symptoms or chest pain.  No noted dysrhythmias noted on EKG on cardiac monitoring today and a interrogation of the pacemaker did not show any notable abnormalities.  Electrolytes checked and within normal limit.  Doubt cardiac ischemia without chest pain, no ischemic EKG changes and initial troponin negative.  Age-adjusted D-dimer is negative doubt VTE.  With pain and bruising in the calf I suspect a calf strain, got DVT ultrasound as a precaution and fortunately looks negative.  Not consistent with ischemic limb.  Patient remained stable, comfortable, and given unremarkable workup as above but that he can follow-up as an outpatient.  I considered hospitalization for admission or observation however given stability in the emergency department, normal vital signs, no malignant dysrhythmias noted on cardiac monitoring nor on pacemaker interrogation, and negative DVT ultrasound I think he can go home tonight and follow-up with his doctor in the morning.        FINAL CLINICAL IMPRESSION(S) / ED DIAGNOSES   Final diagnoses:  Palpitations  Right calf pain     Rx / DC Orders   ED Discharge Orders     None        Note:  This document was prepared using Dragon voice recognition software and may include unintentional dictation errors.    Shams Ginnie, MD 05/21/24 [phone]

## 2024-05-20 NOTE — ED Notes (Signed)
 Pt ambulatory to rm 7. Pt hooked up to 5 lead cardiac monitor. Bed locked and in lowest position with call bell within reach. Pt wife at bedside and both pt and his wife provided with a warm blanket. No further needs at this time. Zoyie, RN, made aware.

## 2024-05-20 NOTE — Telephone Encounter (Signed)
 Patient and his neighbor called the after-hours answering service.  Patient was having edema and erythema and one of his legs.  Denies hitting his legs on anything recently.  Patient's neighbor is a Engineer, civil (consulting) and she feels like his heart rate is irregular and that he has crackles in his left lower lung. Patient denies any chest pain, or worsening shortness of breath. Advised the patient to get someone to take him to the emergency department to get these concerns evaluated.   Signed,  Morse Clause, PA-C 05/20/2024, 7:23 PM

## 2024-05-20 NOTE — ED Triage Notes (Signed)
 Pt reports noticing his heart rate was irregular tonight, pt reports pacemaker/defibrillator was placed many years ago and he has had no issues. Pt denies chest pain reports he does have some sob but has hx copd and is at his baseline with breathing. Pt is also reporting some bruising and pain to right lower leg that began today with no known injury.

## 2024-05-21 NOTE — Discharge Instructions (Signed)
 You did not have a blood clot on your testing today.  Your blood testing was normal and your pacemaker interrogation did not show any markedly abnormal events.  For your leg swelling take an extra dose of your Lasix  tomorrow and call your cardiologist for an appointment.  Thank you for choosing us  for your health care today!  Please see your primary doctor this week for a follow up appointment.   If you have any new, worsening, or unexpected symptoms call your doctor right away or come back to the emergency department for reevaluation.  It was my pleasure to care for you today.   Ginnie EDISON Cyrena, MD

## 2024-05-25 ENCOUNTER — Telehealth: Payer: Self-pay

## 2024-05-25 NOTE — Telephone Encounter (Signed)
 Received call from patient.  He had 8/28 ED visit due to irregular heart rate when taking BP at home and calf pain.  He has nurse in the community that advised him to go to ED visit.  Pt reports all tests were negative and no indications of rhythm problems.  He stated he feels like he has to take PRN Lasix  more often and asked if he should be taking daily instead of PRN.  Encouraged to discuss Lasix  dosage with Dr Cindie at 9/17 OV.  He requested a review of fluid levels and will send manual remote transmission today if he can get it sent before 5 pm.  Will call back after report is received and reviewed.

## 2024-05-26 NOTE — Telephone Encounter (Signed)
 Spoke with patient.  He changed his mind about sending a remote transmission for review since he feels okay.  He does not feel like he has any fluid at this time.  He will call back if condition changes.  Advised to discuss any concerns with Dr Cindie at upcoming office visit 9/17.

## 2024-06-03 ENCOUNTER — Encounter

## 2024-06-03 LAB — CUP PACEART REMOTE DEVICE CHECK
Battery Remaining Longevity: 5 mo
Battery Remaining Percentage: 6 %
Battery Voltage: 2.63 V
Brady Statistic AP VP Percent: 1 %
Brady Statistic AP VS Percent: 1 %
Brady Statistic AS VP Percent: 96 %
Brady Statistic AS VS Percent: 1.5 %
Brady Statistic RA Percent Paced: 1 %
Date Time Interrogation Session: 20250911032730
HighPow Impedance: 93 Ohm
HighPow Impedance: 93 Ohm
Implantable Lead Connection Status: 753985
Implantable Lead Connection Status: 753985
Implantable Lead Connection Status: 753985
Implantable Lead Implant Date: 20180906
Implantable Lead Implant Date: 20180906
Implantable Lead Implant Date: 20180906
Implantable Lead Location: 753858
Implantable Lead Location: 753859
Implantable Lead Location: 753860
Implantable Pulse Generator Implant Date: 20180906
Lead Channel Impedance Value: 1350 Ohm
Lead Channel Impedance Value: 550 Ohm
Lead Channel Impedance Value: 560 Ohm
Lead Channel Pacing Threshold Amplitude: 0.5 V
Lead Channel Pacing Threshold Amplitude: 0.5 V
Lead Channel Pacing Threshold Amplitude: 2.375 V
Lead Channel Pacing Threshold Pulse Width: 0.4 ms
Lead Channel Pacing Threshold Pulse Width: 0.5 ms
Lead Channel Pacing Threshold Pulse Width: 1 ms
Lead Channel Sensing Intrinsic Amplitude: 10.2 mV
Lead Channel Sensing Intrinsic Amplitude: 5 mV
Lead Channel Setting Pacing Amplitude: 1.5 V
Lead Channel Setting Pacing Amplitude: 2 V
Lead Channel Setting Pacing Amplitude: 2.625
Lead Channel Setting Pacing Pulse Width: 0.5 ms
Lead Channel Setting Pacing Pulse Width: 1 ms
Lead Channel Setting Sensing Sensitivity: 0.5 mV
Pulse Gen Serial Number: 7421078

## 2024-06-08 NOTE — Progress Notes (Unsigned)
 Electrophysiology Office Follow up Visit Note:    Date:  06/09/2024   ID:  Benjamin Frost, DOB 04-12-1941, MRN [id number]  PCP:  Benjamin Rock HERO, MD  [facility]Care Cardiologist:  Benjamin Cage, MD  [facility] Electrophysiologist:  Benjamin ONEIDA HOLTS, MD    Interval History:     Benjamin Frost is a 83 y.o. male who presents for a follow up visit.   The patient was last seen by [facility] December 24, 2023.  The patient has a history of nonischemic cardiomyopathy, ventricular fibrillation arrest, chronic systolic heart failure, CRT-D in situ, TAVR in situ, COPD and hypertension.  The patient reported intermittent phrenic stimulation when he was seen last by Benjamin Frost.  The LV threshold was also significantly elevated.  The CRT-D was implanted May 29, 2017 by Dr. Kelsie.  He is doing well this morning.  He feels well.  He is excited for the upcoming hunting season.  The phrenic stimulation does not bother him.  He understands how to move to avoid stimulation.      Past medical, surgical, social and family history were reviewed.  ROS:   Please see the history of present illness.    All other systems reviewed and are negative.  EKGs/Labs/Other Studies Reviewed:    The following studies were reviewed today:  May 20, 2024 chest x-ray reviewed.  CRT-D in situ.  Anterolateral LV lead placement.  November 03, 2018 echo EF 40 to 45%   June 09, 2024 in clinic device interrogation personally reviewed Estimated longevity less than 4 months until ERI Lead parameters are stable Positional phrenic stim, chronic issue       Physical Exam:    VS:  BP 110/74 (BP Location: Left Arm, Patient Position: Sitting, Cuff Size: Large)   Pulse 77   Ht 5' 9 (1.753 m)   Wt 200 lb 6 oz (90.9 kg)   SpO2 96%   BMI 29.59 kg/m     Wt Readings from Last 3 Encounters:  06/09/24 200 lb 6 oz (90.9 kg)  05/20/24 195 lb (88.5 kg)  03/25/24 202 lb 6 oz (91.8 kg)     GEN: no  distress CARD: RRR, No MRG.  CRT-D pocket well-healed RESP: No IWOB. CTAB.      ASSESSMENT:    1. Chronic systolic heart failure (HCC)   2. Cardiac resynchronization therapy defibrillator (CRT-D) in place   3. Aortic stenosis, severe   4. S/P TAVR (transcatheter aortic valve replacement)    PLAN:    In order of problems listed above:  #Chronic systolic heart failure #CRT-D in situ #LV lead with high pacing threshold and phrenic capture At this time, I do not think the risks of lead extraction and replacement warrant any potential benefit.  The LV capture threshold is stable but elevated.  His phrenic stimulation is intermittent and positional.  I have recommended generator replacement when his device reaches ERI.  Risks, benefits, and alternatives to ICD pulse generator replacement were discussed in detail today.  The patient understands that risks include but are not limited to bleeding, infection, pneumothorax, perforation, tamponade, vascular damage, renal failure, MI, stroke, death, inappropriate shocks, damage to his existing leads, and lead dislodgement and wishes to proceed.    #Severe aortic stenosis Doing well after TAVR.  Appears well compensated.  Follow-up with EP in 1 year or sooner based on generator replacement timeline.    Signed, Benjamin HOLTS, MD, [facility], [facility] 06/09/2024 10:30 AM    Electrophysiology [facility]  HeartCare

## 2024-06-09 ENCOUNTER — Encounter: Payer: Self-pay | Admitting: Cardiology

## 2024-06-09 ENCOUNTER — Ambulatory Visit: Attending: Cardiology | Admitting: Cardiology

## 2024-06-09 VITALS — BP 110/74 | HR 77 | Ht 69.0 in | Wt 200.4 lb

## 2024-06-09 DIAGNOSIS — Z95810 Presence of automatic (implantable) cardiac defibrillator: Secondary | ICD-10-CM | POA: Diagnosis not present

## 2024-06-09 DIAGNOSIS — Z952 Presence of prosthetic heart valve: Secondary | ICD-10-CM

## 2024-06-09 DIAGNOSIS — I350 Nonrheumatic aortic (valve) stenosis: Secondary | ICD-10-CM

## 2024-06-09 DIAGNOSIS — I5022 Chronic systolic (congestive) heart failure: Secondary | ICD-10-CM | POA: Diagnosis not present

## 2024-06-09 LAB — CUP PACEART INCLINIC DEVICE CHECK
Battery Remaining Longevity: 3 mo
Brady Statistic RA Percent Paced: 0.25 %
Brady Statistic RV Percent Paced: 97 %
Date Time Interrogation Session: 20250917122719
HighPow Impedance: 79.875
Implantable Lead Connection Status: 753985
Implantable Lead Connection Status: 753985
Implantable Lead Connection Status: 753985
Implantable Lead Implant Date: 20180906
Implantable Lead Implant Date: 20180906
Implantable Lead Implant Date: 20180906
Implantable Lead Location: 753858
Implantable Lead Location: 753859
Implantable Lead Location: 753860
Implantable Pulse Generator Implant Date: 20180906
Lead Channel Impedance Value: 1225 Ohm
Lead Channel Impedance Value: 537.5 Ohm
Lead Channel Impedance Value: 600 Ohm
Lead Channel Pacing Threshold Amplitude: 0.5 V
Lead Channel Pacing Threshold Amplitude: 0.625 V
Lead Channel Pacing Threshold Amplitude: 2.25 V
Lead Channel Pacing Threshold Pulse Width: 0.4 ms
Lead Channel Pacing Threshold Pulse Width: 0.5 ms
Lead Channel Pacing Threshold Pulse Width: 1 ms
Lead Channel Sensing Intrinsic Amplitude: 3.8 mV
Lead Channel Sensing Intrinsic Amplitude: 4.5 mV
Lead Channel Setting Pacing Amplitude: 1.625
Lead Channel Setting Pacing Amplitude: 2 V
Lead Channel Setting Pacing Amplitude: 2.5 V
Lead Channel Setting Pacing Pulse Width: 0.5 ms
Lead Channel Setting Pacing Pulse Width: 1 ms
Lead Channel Setting Sensing Sensitivity: 0.5 mV
Pulse Gen Serial Number: 7421078

## 2024-06-09 NOTE — Patient Instructions (Addendum)
 Medication Instructions:  Your physician recommends that you continue on your current medications as directed. Please refer to the Current Medication list given to you today.  *If you need a refill on your cardiac medications before your next appointment, please call your pharmacy*   Testing/Procedures: ICD Generator Change - we will contact you when it is time to schedule.   Follow-Up: At [facility], you and your health needs are our priority.  As part of our continuing mission to provide you with exceptional heart care, our providers are all part of one team.  This team includes your primary Cardiologist (physician) and Advanced Practice Providers or APPs (Physician Assistants and Nurse Practitioners) who all work together to provide you with the care you need, when you need it.  Your next appointment:   1 year  Provider:   You may see OLE ONEIDA HOLTS, MD or one of the following Advanced Practice Providers on your designated Care Team:   Charlies Arthur, [place] Ozell Jodie Passey, PA-C Suzann Riddle, NP Daphne Barrack, NP Artist Pouch, PA-C

## 2024-06-10 ENCOUNTER — Ambulatory Visit: Payer: Self-pay | Admitting: Cardiology

## 2024-06-10 ENCOUNTER — Telehealth: Payer: Self-pay | Admitting: Cardiology

## 2024-06-10 NOTE — Telephone Encounter (Signed)
 Patient seen in the [facility] office yesterday with Dr. Cindie.  Upon in office interrogation, it appeared the patient was having RA lead noise.  Later printed strip and reviewed with [employer] rep and Dr. Cindie- both confirmed new A-fib.  Per Dr. Cindie, will continue to monitor for now due to short duration (4 hours 48 min).  Called the patient today to notify him that he had had brief A-fib. Per the patient, he feels he may have had more of this- seen in the ER on 05/20/24 for palpitations. He advised her had a his neighbor, who is a Engineer, civil (consulting), check his HR prior to going to the ER and she advised his pulse was irregular.   Patient's RA sensitivity is currently set to auto (currently will not go below 0.5 mV).  Discussed with the patient and he is agreeable to coming to the [place] office on 06/22/24 at 11:00 am for a Device Clinic appointment to program sensitivity to 0.25 mV.   Benjamin Frost voices understanding of the above and was appreciative of the call.

## 2024-06-14 ENCOUNTER — Ambulatory Visit: Attending: Cardiology

## 2024-06-14 ENCOUNTER — Telehealth: Payer: Self-pay

## 2024-06-14 DIAGNOSIS — Z95810 Presence of automatic (implantable) cardiac defibrillator: Secondary | ICD-10-CM

## 2024-06-14 DIAGNOSIS — I5022 Chronic systolic (congestive) heart failure: Secondary | ICD-10-CM | POA: Diagnosis not present

## 2024-06-14 NOTE — Progress Notes (Signed)
 Spoke with patient and heart failure questions reviewed.  Transmission results reviewed.  Pt reports he has been feeling okay.    Encouraged to drink 64 oz fluid to stay hydrated.  He denies any dehydration symptoms.  He has an appt with device clinic 9/30 to make adjustments to his device.  Advised will recheck fluid levels next week.

## 2024-06-14 NOTE — Telephone Encounter (Signed)
 Remote ICM transmission received.  Attempted call to patient regarding ICM remote transmission and no answer.

## 2024-06-14 NOTE — Progress Notes (Signed)
 EPIC Encounter for ICM Monitoring  Patient Name: Benjamin Frost is a 83 y.o. male Date: 06/14/2024 Primary Care Physican: Buren Rock HERO, MD Primary Cardiologist: Darron Electrophysiologist: Cindie Pore Pacing:  98%           12/03/2023 Weight: 196-197 lbs  01/06/2024 Weight: 195 lbs 01/12/2024 Weight: 195 lbs     01/26/2024 Weight: 195 lbs   02/19/2024 Weight: 195 lbs   04/09/2024 Weight: 195 lbs 05/11/2024 Weight: 195 lbs   Battery Longevity: 3.8 months                                    Attempted call to patient and unable to reach.  Transmission results reviewed.     Diet: Discussed limiting salt intake to 2000 mg daily and consistent with fluid intake of 64 oz daily.  He is unsure how much fluid or salt he takes daily.     Corvue Thoracic impedance suggesting normal fluid levels with the exception of possible fluid accumulation starting 9/4-9/15 and start of possible dryness on 9/20.     Prescribed:  Furosemide  20 mg take 1 tablet daily as needed for weight gain of 2 lbs or greater (takes on average once a month).    Recommendations:   Unable to reach.     Follow-up plan: ICM clinic phone appointment on 06/21/2024 to recheck fluid levels.   91 day device clinic remote transmission 08/02/2024.    EP/Cardiology Office Visits:  06/09/2024 with Dr Cindie.   Recall 12/20/2024 with Dr Darron (6 month f/u).   Copy of ICM check sent to Dr. Cindie.   3 month ICM trend: 06/14/2024.    12-14 Month ICM trend:     [name], RN 06/14/2024 4:34 PM

## 2024-06-18 NOTE — Progress Notes (Signed)
Remote ICD Transmission.

## 2024-06-21 ENCOUNTER — Ambulatory Visit: Attending: Cardiology

## 2024-06-21 DIAGNOSIS — I5022 Chronic systolic (congestive) heart failure: Secondary | ICD-10-CM

## 2024-06-21 DIAGNOSIS — Z95810 Presence of automatic (implantable) cardiac defibrillator: Secondary | ICD-10-CM

## 2024-06-22 ENCOUNTER — Ambulatory Visit: Attending: Cardiovascular Disease

## 2024-06-22 NOTE — Progress Notes (Unsigned)
 EPIC Encounter for ICM Monitoring  Patient Name: Benjamin Frost is a 83 y.o. male Date: 06/22/2024 Primary Care Physican: Buren Rock HERO, MD Primary Cardiologist: Darron Electrophysiologist: Cindie Pore Pacing:  98%           12/03/2023 Weight: 196-197 lbs  01/06/2024 Weight: 195 lbs 01/12/2024 Weight: 195 lbs     01/26/2024 Weight: 195 lbs   02/19/2024 Weight: 195 lbs   04/09/2024 Weight: 195 lbs 05/11/2024 Weight: 195 lbs   Battery Longevity: 3.6 months                                    Spoke with patient and heart failure questions reviewed.  Transmission results reviewed.  Pt asymptomatic for fluid accumulation.  He had in office device check on 9/30 and was told he did not have any fluid.     Diet: Discussed limiting salt intake to 2000 mg daily and consistent with fluid intake of 64 oz daily.  He is unsure how much fluid or salt he takes daily.     Since 06/14/2024 ICM Remote Transmission: Corvue Thoracic impedance suggesting changed from possible dryness on 9/20 to possible fluid accumulation on 06/17/2024 but trending back to baseline.     Prescribed:  Furosemide  20 mg take 1 tablet daily as needed for weight gain of 2 lbs or greater (takes on average once a month).    Recommendations:  No changes and encouraged to call if experiencing any fluid symptoms.   Follow-up plan: ICM clinic phone appointment on 07/19/2024.   91 day device clinic remote transmission 08/05/2024.    EP/Cardiology Office Visits:  Recall 06/09/2025 with Dr Cindie.   Recall 12/20/2024 with Dr Darron (6 month f/u).   Copy of ICM check sent to Dr. Cindie.   Remote monitoring is medically necessary for Heart Failure Management.    90 day Daily Thoracic Impedance ICM trend: 03/22/2024 through 06/21/2024.    12-14 Month Thoracic Impedance ICM trend:     [name], RN 06/22/2024 10:46 AM

## 2024-06-22 NOTE — Progress Notes (Signed)
 Patient in today for follow up from last in office check.  Further review of what was initially suspicious for RA lead noise was reviewed after with industry and determined to be fine AF that is being undersensed.   Brought patient back in today to make programming adjustments:  Turned off RA autosense Hard programmed RA sensitivity to 0.64mv.   Ensured atrial episode monitoring alerts were turned on for clinic.    Watched presenting for period and performed repeat sensitivity test with no signs of oversensing.    Patient aware that we will continue to monitor for AF events and call patient if any concerns.  In addition, he was given symptoms to watch for with potential AF and the direct device clinic number to call if any concerns.  Family member present and both verbalize agreement with plan.

## 2024-06-28 ENCOUNTER — Telehealth: Payer: Self-pay

## 2024-06-28 NOTE — Telephone Encounter (Signed)
 Patient sent transmission to evaluate AF burden with recent changes in device clinic.  No further AF detected, patient made aware will continue to monitor.

## 2024-07-05 ENCOUNTER — Encounter

## 2024-07-19 ENCOUNTER — Ambulatory Visit: Attending: Cardiology

## 2024-07-19 DIAGNOSIS — I5022 Chronic systolic (congestive) heart failure: Secondary | ICD-10-CM | POA: Diagnosis not present

## 2024-07-19 DIAGNOSIS — Z95810 Presence of automatic (implantable) cardiac defibrillator: Secondary | ICD-10-CM

## 2024-07-20 NOTE — Progress Notes (Signed)
 Requested device clinic to review AT/AF Burden.  Per message from device clinic nurse:   Claudene Delon LABOR, RN  Sally Menard, Mitzie RAMAN, RN Looks like farfield oversensing now.  No true afib.  Randall

## 2024-07-20 NOTE — Progress Notes (Addendum)
 EPIC Encounter for ICM Monitoring  Patient Name: Benjamin Frost is a 83 y.o. male Date: 07/20/2024 Primary Care Physican: Buren Rock HERO, MD Primary Cardiologist: Darron Electrophysiologist: Cindie Pore Pacing:  92%           12/03/2023 Weight: 196-197 lbs  01/06/2024 Weight: 195 lbs 01/12/2024 Weight: 195 lbs     01/26/2024 Weight: 195 lbs   02/19/2024 Weight: 195 lbs   04/09/2024 Weight: 195 lbs 05/11/2024 Weight: 195 lbs 07/20/2024 Weight: 191 lbs   Battery Longevity: 3.6 months  AT/AF Burden 11%                                   Spoke with patient and heart failure questions reviewed.  Transmission results reviewed.  Pt asymptomatic for fluid accumulation.  He has a cold with laryngitis.    Diet:   No updates   Since 06/21/2024 ICM Remote Transmission: Corvue Thoracic impedance suggesting normal fluid levels with the exception of possible fluid accumulation from 06/27/2024-07/04/2024.     Prescribed:  Furosemide  20 mg take 1 tablet daily as needed for weight gain of 2 lbs or greater (takes on average once a month).    Recommendations:  No changes and encouraged to call if experiencing any fluid symptoms.   Follow-up plan: ICM clinic phone appointment on 08/23/2024.   91 day device clinic remote transmission 08/05/2024.    EP/Cardiology Office Visits:  Recall 06/09/2025 with Dr Cindie.   Recall 12/20/2024 with Dr Darron (6 month f/u).   Copy of ICM check sent to Dr. Cindie.   Remote monitoring is medically necessary for Heart Failure Management.    Daily Thoracic Impedance ICM trend: 04/20/2024 through 07/19/2024.    12-14 Month Thoracic Impedance ICM trend:     [name], RN 07/20/2024 3:16 PM

## 2024-08-05 ENCOUNTER — Encounter

## 2024-08-05 ENCOUNTER — Ambulatory Visit: Attending: Cardiology

## 2024-08-05 DIAGNOSIS — I5022 Chronic systolic (congestive) heart failure: Secondary | ICD-10-CM | POA: Diagnosis not present

## 2024-08-05 LAB — CUP PACEART REMOTE DEVICE CHECK
Battery Remaining Longevity: 4 mo
Battery Remaining Percentage: 4 %
Battery Voltage: 2.62 V
Brady Statistic AP VP Percent: 1 %
Brady Statistic AP VS Percent: 1 %
Brady Statistic AS VP Percent: 96 %
Brady Statistic AS VS Percent: 2.4 %
Brady Statistic RA Percent Paced: 1 %
Date Time Interrogation Session: 20251113023420
HighPow Impedance: 88 Ohm
HighPow Impedance: 88 Ohm
Implantable Lead Connection Status: 753985
Implantable Lead Connection Status: 753985
Implantable Lead Connection Status: 753985
Implantable Lead Implant Date: 20180906
Implantable Lead Implant Date: 20180906
Implantable Lead Implant Date: 20180906
Implantable Lead Location: 753858
Implantable Lead Location: 753859
Implantable Lead Location: 753860
Implantable Pulse Generator Implant Date: 20180906
Lead Channel Impedance Value: 1225 Ohm
Lead Channel Impedance Value: 510 Ohm
Lead Channel Impedance Value: 660 Ohm
Lead Channel Pacing Threshold Amplitude: 0.5 V
Lead Channel Pacing Threshold Amplitude: 0.5 V
Lead Channel Pacing Threshold Amplitude: 2.625 V
Lead Channel Pacing Threshold Pulse Width: 0.4 ms
Lead Channel Pacing Threshold Pulse Width: 0.5 ms
Lead Channel Pacing Threshold Pulse Width: 1 ms
Lead Channel Sensing Intrinsic Amplitude: 4.1 mV
Lead Channel Sensing Intrinsic Amplitude: 5 mV
Lead Channel Setting Pacing Amplitude: 1.5 V
Lead Channel Setting Pacing Amplitude: 2 V
Lead Channel Setting Pacing Amplitude: 2.875
Lead Channel Setting Pacing Pulse Width: 0.5 ms
Lead Channel Setting Pacing Pulse Width: 1 ms
Lead Channel Setting Sensing Sensitivity: 0.5 mV
Pulse Gen Serial Number: 7421078

## 2024-08-08 ENCOUNTER — Ambulatory Visit: Payer: Self-pay | Admitting: Cardiology

## 2024-08-09 NOTE — Telephone Encounter (Signed)
 Called patient and discussed far field oversensing issue with him  This RN suggested that patient come in to clinic for device reprogramming   Patient agreeable to this suggestion   Patient scheduled in device clinic 08/18/24   Patient appreciative for the call and assistance scheduling appointment

## 2024-08-10 NOTE — Progress Notes (Signed)
 Remote ICD Transmission

## 2024-08-16 ENCOUNTER — Ambulatory Visit

## 2024-08-18 ENCOUNTER — Ambulatory Visit: Attending: Internal Medicine

## 2024-08-18 ENCOUNTER — Telehealth: Payer: Self-pay

## 2024-08-18 DIAGNOSIS — I428 Other cardiomyopathies: Secondary | ICD-10-CM

## 2024-08-18 LAB — CUP PACEART INCLINIC DEVICE CHECK
Date Time Interrogation Session: 20251126115721
Implantable Lead Connection Status: 753985
Implantable Lead Connection Status: 753985
Implantable Lead Connection Status: 753985
Implantable Lead Implant Date: 20180906
Implantable Lead Implant Date: 20180906
Implantable Lead Implant Date: 20180906
Implantable Lead Location: 753858
Implantable Lead Location: 753859
Implantable Lead Location: 753860
Implantable Pulse Generator Implant Date: 20180906
Pulse Gen Serial Number: 7421078

## 2024-08-18 NOTE — Telephone Encounter (Signed)
 Alert received from [employer] for AMS episode markers suggest FFOS, recent ov determined undersensing of AF, no OAC per chart.  Spoke to [employer], [employer] who advised to extend PVAB to 150 ms.  Patient as device clinic apt today 2 12:00 PM.

## 2024-08-18 NOTE — Patient Instructions (Signed)
Patient will follow up as scheduled.

## 2024-08-18 NOTE — Progress Notes (Signed)
 PVAB programmed from 130 ms to 150 ms.  RA sensing programmed from 0.2 MV to 0.3 mV  Changes made advised by Dorise Regal, [employer] Rep.

## 2024-08-23 ENCOUNTER — Ambulatory Visit: Payer: Self-pay | Admitting: Cardiology

## 2024-08-23 ENCOUNTER — Ambulatory Visit: Attending: Cardiology

## 2024-08-23 DIAGNOSIS — I5022 Chronic systolic (congestive) heart failure: Secondary | ICD-10-CM

## 2024-08-23 DIAGNOSIS — Z95810 Presence of automatic (implantable) cardiac defibrillator: Secondary | ICD-10-CM

## 2024-08-23 NOTE — Progress Notes (Signed)
 EPIC Encounter for ICM Monitoring  Patient Name: Benjamin Frost is a 83 y.o. male Date: 08/23/2024 Primary Care Physican: Buren Rock HERO, MD Primary Cardiologist: Darron Electrophysiologist: Kennyth Pore Pacing:  98%           12/03/2023 Weight: 196-197 lbs  01/06/2024 Weight: 195 lbs 01/12/2024 Weight: 195 lbs     01/26/2024 Weight: 195 lbs   02/19/2024 Weight: 195 lbs   04/09/2024 Weight: 195 lbs 05/11/2024 Weight: 195 lbs 07/20/2024 Weight: 191 lbs 08/23/2024 Weight: 190-195 lbs   Battery Longevity: <3 months  AT/AF Burden <1%                                 Spoke with patient and heart failure questions reviewed.  Transmission results reviewed.  Pt asymptomatic for fluid accumulation.     Diet:   No updates   Since 07/19/2024 ICM Remote Transmission: Corvue Thoracic impedance suggesting possible fluid accumulation from 07/24/2024-08/07/2024 and 08/14/2024-08/22/2024.     Prescribed:  Furosemide  20 mg take 1 tablet daily as needed for weight gain of 2 lbs or greater (takes on average once a month).    Recommendations:  No changes and encouraged to call if experiencing any fluid symptoms.   Follow-up plan: ICM clinic phone appointment on 09/27/2024.   91 day device clinic remote transmission 11/06/2024.    EP/Cardiology Office Visits:  Recall 06/09/2025 with Dr Cindie.   Recall 12/20/2024 with Dr Darron (6 month f/u).   Copy of ICM check sent to Dr. Kennyth.   Remote monitoring is medically necessary for Heart Failure Management.    Daily Thoracic Impedance ICM trend: 05/25/2024 through 08/23/2024.    12-14 Month Thoracic Impedance ICM trend:     [name], RN 08/23/2024 3:35 PM

## 2024-09-02 ENCOUNTER — Telehealth: Payer: Self-pay | Admitting: Cardiology

## 2024-09-02 DIAGNOSIS — Z95810 Presence of automatic (implantable) cardiac defibrillator: Secondary | ICD-10-CM

## 2024-09-02 DIAGNOSIS — I5022 Chronic systolic (congestive) heart failure: Secondary | ICD-10-CM

## 2024-09-02 NOTE — Telephone Encounter (Signed)
 Alert remote transmission received from [employer]:  ERI reached 09/01/24 - route to triage Follow up as scheduled.  HF diagnostics frequently abnormal     I called and spoke with the patient. Confirmed that what he was feeling yesterday was a vibration in his chest, but it gave him a weird sensation.   Advised the patient that his device did reach ERI as of yesterday and the vibration was due to this alert. He is aware that since transmitting, the vibration should stop and that I will reach out to Dr. Hiram nurse and our EP procedure scheduler so they can contact him to set up a date/ time for her generator change out.   The patient states he just received his letter that Dr. Cindie is leaving. I discussed with the patient that Dr. Cindie is leaving at the beginning of January and Dr. Kennyth will be assuming his care.  The patient voices understanding and advised that he would prefer to wait until the new year to have his device changed out.   Risks/ benefits were reviewed with the patient at his appointment in 05/2024 with Dr. Cindie.

## 2024-09-02 NOTE — Telephone Encounter (Signed)
°  1. Has your device fired? He thinks it happened several times yesterday  2. Is you device beeping? no  3. Are you experiencing draining or swelling at device site? no  4. Are you calling to see if we received your device transmission? yes  5. Have you passed out? no   Please route to Device Clinic Pool

## 2024-09-03 NOTE — Telephone Encounter (Signed)
 Pt is scheduled for BiV-ICD Gen Change with Dr. Kennyth at [facility] at 1:00 pm. He will go to [facility] office for labs on 1/5.   I will mail instruction letter to pt - address verified.   Pt to hold ASA morning of procedure per JP.

## 2024-09-05 ENCOUNTER — Ambulatory Visit

## 2024-09-06 ENCOUNTER — Encounter

## 2024-09-06 ENCOUNTER — Telehealth: Payer: Self-pay

## 2024-09-06 NOTE — Telephone Encounter (Signed)
 Pt returned call and stated he no longer had any questions.  He has a battery replacement schedule for 10/11/2024.  Advised will check fluid levels on 09/27/2024.  He appreciated the call back.

## 2024-09-06 NOTE — Telephone Encounter (Signed)
 Attempted return call to patient as requested by voice mail message.  Mailbox is full and unable to leave message.

## 2024-09-07 LAB — CUP PACEART REMOTE DEVICE CHECK
Battery Remaining Longevity: 0 mo
Battery Voltage: 2.59 V
Brady Statistic AP VP Percent: 1.4 %
Brady Statistic AP VS Percent: 1 %
Brady Statistic AS VP Percent: 95 %
Brady Statistic AS VS Percent: 2.8 %
Brady Statistic RA Percent Paced: 1 %
Date Time Interrogation Session: 20251214020017
HighPow Impedance: 77 Ohm
HighPow Impedance: 77 Ohm
Implantable Lead Connection Status: 753985
Implantable Lead Connection Status: 753985
Implantable Lead Connection Status: 753985
Implantable Lead Implant Date: 20180906
Implantable Lead Implant Date: 20180906
Implantable Lead Implant Date: 20180906
Implantable Lead Location: 753858
Implantable Lead Location: 753859
Implantable Lead Location: 753860
Implantable Pulse Generator Implant Date: 20180906
Lead Channel Impedance Value: 1175 Ohm
Lead Channel Impedance Value: 530 Ohm
Lead Channel Impedance Value: 560 Ohm
Lead Channel Pacing Threshold Amplitude: 0.5 V
Lead Channel Pacing Threshold Amplitude: 0.625 V
Lead Channel Pacing Threshold Amplitude: 2.625 V
Lead Channel Pacing Threshold Pulse Width: 0.4 ms
Lead Channel Pacing Threshold Pulse Width: 0.5 ms
Lead Channel Pacing Threshold Pulse Width: 1 ms
Lead Channel Sensing Intrinsic Amplitude: 4 mV
Lead Channel Sensing Intrinsic Amplitude: 8.9 mV
Lead Channel Setting Pacing Amplitude: 1.625
Lead Channel Setting Pacing Amplitude: 2 V
Lead Channel Setting Pacing Amplitude: 2.875
Lead Channel Setting Pacing Pulse Width: 0.5 ms
Lead Channel Setting Pacing Pulse Width: 1 ms
Lead Channel Setting Sensing Sensitivity: 0.5 mV
Pulse Gen Serial Number: 7421078

## 2024-09-19 ENCOUNTER — Ambulatory Visit: Payer: Self-pay | Admitting: Cardiology

## 2024-09-22 ENCOUNTER — Telehealth: Payer: Self-pay

## 2024-09-22 NOTE — Telephone Encounter (Signed)
 Called pt to inform him of the time change of his BiV-ICD Gen Change with Dr. Kennyth at [facility] on 1/19. He will arrive at 1130am for a 1230am procedure time.   His original instruction letter states that he can have a light breakfast before 7am. I told him to not have any solid foods but that he could have liquids up to 7am. He understood those instructions and stated that he would just have a cup of coffee.   Reminded pt to have his labs done on 1/5 and he is aware of where to go and will have that done.

## 2024-09-24 ENCOUNTER — Telehealth (HOSPITAL_COMMUNITY): Payer: Self-pay

## 2024-09-24 NOTE — Telephone Encounter (Signed)
 Spoke with patient to discuss upcoming procedure.   Confirmed patient is scheduled for a ICD generator change on Monday, January 19 with Dr. Kennyth. Instructed patient to arrive at [facility] entrance, [address] [place], [place], [place]. [address] and check in at front desk at 11:00 AM.   Labs to be completed  Any recent signs of acute illness or been started on antibiotics? No  Any new medications started? No Medication instructions:  Continue taking Farxiga  (for CHF). On the morning of your procedure DO NOT take Spironolactone , Lasix  (furosemide ) or Aspirin . No eating or drinking after midnight prior to procedure.   The night before your procedure and the morning of your procedure, wash thoroughly with the CHG surgical soap from the neck down, paying special attention to the area where your procedure will be performed.  Advised of plan to go home the same day and will only stay overnight if medically necessary. You MUST have a responsible adult to drive you home and MUST be with you the first 24 hours after you arrive home.  Informed a nurse may call a day before the procedure to confirm arrival time and ensure instructions are followed.  Advised to contact RN Navigator at [phone], to inform of any new medications started after call or concerns prior to procedure.  Patient verbalized understanding to all instructions provided and agreed to proceed with procedure.

## 2024-09-27 ENCOUNTER — Ambulatory Visit

## 2024-09-27 DIAGNOSIS — Z95810 Presence of automatic (implantable) cardiac defibrillator: Secondary | ICD-10-CM

## 2024-09-27 DIAGNOSIS — I5022 Chronic systolic (congestive) heart failure: Secondary | ICD-10-CM | POA: Diagnosis not present

## 2024-09-27 LAB — BASIC METABOLIC PANEL WITH GFR
BUN/Creatinine Ratio: 14 (ref 10–24)
BUN: 12 mg/dL (ref 8–27)
CO2: 27 mmol/L (ref 20–29)
Calcium: 9.2 mg/dL (ref 8.6–10.2)
Chloride: 100 mmol/L (ref 96–106)
Creatinine, Ser: 0.83 mg/dL (ref 0.76–1.27)
Glucose: 113 mg/dL — ABNORMAL HIGH (ref 70–99)
Potassium: 4 mmol/L (ref 3.5–5.2)
Sodium: 142 mmol/L (ref 134–144)
eGFR: 86 mL/min/1.73

## 2024-09-27 LAB — CBC
Hematocrit: 45.2 % (ref 37.5–51.0)
Hemoglobin: 14.8 g/dL (ref 13.0–17.7)
MCH: 31.9 pg (ref 26.6–33.0)
MCHC: 32.7 g/dL (ref 31.5–35.7)
MCV: 97 fL (ref 79–97)
Platelets: 194 x10E3/uL (ref 150–450)
RBC: 4.64 x10E6/uL (ref 4.14–5.80)
RDW: 12.3 % (ref 11.6–15.4)
WBC: 7.2 x10E3/uL (ref 3.4–10.8)

## 2024-09-27 NOTE — Progress Notes (Signed)
 EPIC Encounter for ICM Monitoring  Patient Name: Benjamin Frost is a 84 y.o. male Date: 09/27/2024 Primary Care Physican: Buren Rock HERO, MD Primary Cardiologist: Darron Electrophysiologist: Kennyth Pore Pacing:  94%           12/03/2023 Weight: 196-197 lbs  01/06/2024 Weight: 195 lbs 01/12/2024 Weight: 195 lbs     01/26/2024 Weight: 195 lbs   02/19/2024 Weight: 195 lbs   04/09/2024 Weight: 195 lbs 05/11/2024 Weight: 195 lbs 07/20/2024 Weight: 191 lbs 08/23/2024 Weight: 190-195 lbs   Battery Longevity: Battery replacement scheduled for 10/11/2024  AT/AF Burden 3 % (was <1% on 08/23/2024 report)                              Spoke with patient and heart failure questions reviewed.  Transmission results reviewed.  Pt asymptomatic for fluid accumulation.  He does not feel like he has any fluid but will take PRN Furosemide  tomorrow.  He has questions regarding pre procedure instructions he received in a call on 09/24/2024.  I reviewed the phone note and answered the question about which meds to hold, Lasix , Spironolactone  and Aspirin .  Advised the note says he does not need to stop Farxiga .  He also questioned when and where does he get the surgical soap needed.     Diet:   No updates   Since 08/23/2024 ICM Remote Transmission: Corvue Thoracic impedance suggesting possible fluid accumulation starting 09/22/2024.  Also suggesting possible fluid accumulation between 08/29/2024-09/08/2024.  Message sent to devic clinic triage to review AT/AF burden.   Prescribed:  Furosemide  20 mg take 1 tablet daily as needed for weight gain of 2 lbs or greater (takes on average once a month).    Recommendations:  Pt will call back tomorrow.  Message sent to Charleston Sever, RN regarding patient had questions on which meds to hold and when will he get the soap needed prior to procedure.  He will take PRN Furosemide  tomorrow.     Follow-up plan: ICM clinic phone appointment on 10/05/2024 to recheck fluid levels.   91 day  device clinic remote transmission 11/06/2024.    EP/Cardiology Office Visits:  Recall 06/04/2025 with Suzann Riddle NP.   Recall 12/20/2024 with Dr Darron (6 month f/u).   Copy of ICM check sent to Dr. Kennyth and Dr Darron as RICK.    Remote monitoring is medically necessary for Heart Failure Management.    Daily Thoracic Impedance ICM trend: 06/29/2024 through 09/27/2024.    12-14 Month Thoracic Impedance ICM trend:     [name], RN 09/27/2024 1:42 PM

## 2024-09-28 ENCOUNTER — Telehealth: Payer: Self-pay

## 2024-09-28 NOTE — Telephone Encounter (Signed)
Appt 1/8

## 2024-09-28 NOTE — Progress Notes (Signed)
 Spoke with patient and reviewed pre procedure instructions as indicated in Benjamin Frost, [facility].   Reviewed several times and he repeated intsructions back correctly.    Pt has new onset of Afib and appt scheduled with Afib clinic 10/01/2023.  Advised to ask for surgical soap at the appt and also encouraged him to ask for written instructions regarding meds to take or hold prior to battery replacement.  He said he will do so.

## 2024-09-28 NOTE — Progress Notes (Signed)
 Lanis Charleston KIDD, RN  Zain Bingman, Mitzie RAMAN, RN Erskin Mitzie,  Since the patient was not scheduled in the office for his procedure and was not able to get the surgical soap, I told him to ask for this at the [facility] office in [place], where he seen Dr. Kennyth, when he goes for his labs (not ask [facility] because they wouldn't have it).  The information you provided regarding his medications to hold is correct. He will take all other medications with sips of water and nothing to eat or drink after midnight.  I appreciate you assisting him and providing him with this information.  Thanks, [employer]

## 2024-09-28 NOTE — Telephone Encounter (Addendum)
 09/27/2024 Merlin report shows 3% AT/AF burden. Also 09/25/2023 reports shows burden a 3.2%. Both reports have alerts for High V. Rates during AT/AF. December report showed <1% for AT/AF burden. Heart failure diagnostics abnormal at this time.   ______________________________________________________________  Beatris w/ patient regarding device alert for AT/AF & High V rates during AT/AF. Patient states he has noticed recent fatigue and sometimes has dizzy spells while sitting down. States dizzy spells are almost to the point of passing out and then it goes away. States medication compliance and has not missed any meds. Patient c/o intermittent positional phrenic STIM. Known hx of phrenic STIM.   Patient scheduled for Gen Change on 10/12/2023. After reviewing w/ Dr. Kennyth, recommendations for visit w/ AF clinic next available to discuss starting OAC. If OAC is started, recommendation to hold OAC 3 days prior to Gen Change.   Patient aware and agreeable for AF clinic visit. Patient scheduled w/ AF clinic on 09/30/2024 @ 10am. Verbalized understanding.

## 2024-09-28 NOTE — Progress Notes (Signed)
 Message from device clinic.    Received: Today Tammy Rozelle SAUNDERS, RN  Josefa Syracuse, Mitzie RAMAN, RN I will F/U and discuss with Dr. Kennyth. Thanks!

## 2024-09-29 NOTE — Progress Notes (Incomplete)
 "  Primary Care Physician: Buren Rock HERO, MD Primary Cardiologist: Deatrice Cage, MD Electrophysiologist: OLE ONEIDA HOLTS, MD  Referring Physician: ***   Benjamin Frost is a 84 y.o. 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  The patient was initially diagnosed with atrial fibrillation recently when a device alert was received showing high ventricular rates and 3% AT/AF burden on 09/27/2024.  He also received alert of 3.2% burden on 09/24/2024.  He was previously scheduled for device generator change on 10/11/2024 with recommendations to follow-up today to discuss initiation of oral anticoagulant.  He will undergo device generator change after holding anticoagulant for 3 days.  Patient presents today for follow up for atrial fibrillation. ***  Today, he denies symptoms of ***palpitations, chest pain, shortness of breath, orthopnea, PND, lower extremity edema, dizziness, presyncope, syncope, snoring, daytime somnolence, bleeding, or neurologic sequela. The patient is tolerating medications without difficulties and is otherwise without complaint today.    Notes: ***  Atrial Fibrillation Management history: History of Sleep Apnea*** Previous antiarrhythmic drugs: None Previous cardioversions: None Previous ablations: none Anticoagulation history: None  ROS- All systems are reviewed and negative except as per the HPI above.  Past Medical History:  Diagnosis Date   AICD (automatic cardioverter/defibrillator) present    a. St Jude, placed after VF arrest during a dobuatamine echo.    Aortic stenosis, severe    a. 11/04/17: s/p TAVR by Dr. Wonda and Dr. Lucas; b. 10/2018 Echo: 26mm Edwards Sapien bioprosthetic AoV. Mean grad (down from on prior study).    Chronic systolic heart failure (HCC)    a. 2015 EF 10-15%; b. 10/2018 Echo: EF 40-45%, nl RV fxn. Mildly dil LA.   COPD (chronic obstructive pulmonary disease) (HCC)    GERD (gastroesophageal reflux disease)    History of BPH    History of kidney stones    Hyperlipidemia    Hypertension    Left bundle branch block    NICM (nonischemic cardiomyopathy) (HCC)    a. 2015 EF 10-15%; b. 03/2014 cath: nl cors; c. S/p SJM CRT-D; d. 10/2018 Echo: EF 40-45%.   S/P TAVR (transcatheter aortic valve replacement)    a. 10/2017: s/p TAVR with an Edwards Sapien 3 THV (size 26 mm, model # 9600TFX, serial # [id number])   Sinus tachycardia     Current Outpatient Medications  Medication Sig Dispense Refill   albuterol  (VENTOLIN  HFA) 108 (90 Base) MCG/ACT inhaler Inhale 2 puffs into the lungs every 6 (six) hours as needed for wheezing or shortness of breath.     amoxicillin  (AMOXIL ) 500 MG tablet Take 2,000 mg (four 500 mg tablets) one hour prior to dental appointments. 4 tablet 6   aspirin  81 MG tablet Take 81 mg by mouth daily.     atorvastatin  (LIPITOR) 40 MG tablet Take 40 mg by mouth daily.     carvedilol  (COREG ) 6.25 MG tablet Take 1 tablet (6.25 mg total) by mouth 2 (two) times daily. 180 tablet 1   cetirizine (ZYRTEC) 10 MG tablet Take 10 mg by mouth daily.     DULERA  200-5 MCG/ACT AERO Inhale 2 puffs into the lungs 2 (two) times daily.     FARXIGA  10 MG TABS tablet Take 1 tablet (10 mg total) by mouth daily before breakfast. 90 tablet  2   fluticasone (FLONASE) 50 MCG/ACT nasal spray Place 1 spray into both nostrils daily. (Patient not taking: Reported on 06/09/2024)     Fluticasone-Salmeterol (ADVAIR) 250-50 MCG/DOSE AEPB Inhale 1 puff into the lungs 2 (two) times daily.     furosemide  (LASIX ) 20 MG tablet Take 1 tablet (20 mg total) by mouth daily as needed. For weight gain greater than 2 lb. 30 tablet 3   losartan  (COZAAR ) 25 MG tablet Take 1 tablet (25 mg total) by mouth daily. 30 tablet 3    pantoprazole  (PROTONIX ) 40 MG tablet Take 1 tablet (40 mg total) by mouth daily. 30 tablet 0   spironolactone  (ALDACTONE ) 25 MG tablet Take 1 tablet (25 mg total) by mouth daily. 30 tablet 0   tamsulosin  (FLOMAX ) 0.4 MG CAPS capsule Take 0.4 mg by mouth daily.     tiotropium (SPIRIVA ) 18 MCG inhalation capsule Place 18 mcg into inhaler and inhale daily.     No current facility-administered medications for this visit.    Physical Exam: There were no vitals taken for this visit.  GEN: Well nourished, well developed in no acute distress NECK: No JVD; No carotid bruits CARDIAC: {EPRHYTHM:28826}, no murmurs, rubs, gallops RESPIRATORY:  Clear to auscultation without rales, wheezing or rhonchi  ABDOMEN: Soft, non-tender, non-distended EXTREMITIES:  No edema; No deformity   Wt Readings from Last 3 Encounters:  06/09/24 90.9 kg  05/20/24 88.5 kg  03/25/24 91.8 kg     EKG today demonstrates:   EKG Interpretation Date/Time:    Ventricular Rate:    PR Interval:    QRS Duration:    QT Interval:    QTC Calculation:   R Axis:      Text Interpretation:          Echo Completed 11/03/2018: 1. The left ventricle has mild-moderately reduced systolic function of  40-45%. The cavity size was mildly increased. There is mildly increased  left ventricular wall thickness. Left ventricular diastolic Doppler  parameters are consistent with impaired  relaxation.   2. The right ventricle has normal systolic function. The cavity was  mildly enlarged. There is not assessed. Right ventricular systolic  pressure could not be assessed.   3. Left atrial size was mildly dilated.   4. Right atrial size was mildly dilated.   5. The mitral valve is degenerative. There is mild thickening. There is  moderate mitral annular calcification present.   6. The tricuspid valve is not assessed.   7. A 26mm Edwards Sapien bioprosthetic aortic valve valve is present in  the aortic position. Procedure Date:  11/04/17. Mean gradient is 15 mmHg,  decreased from 21 mmHg on prior study.   8. The aortic root and ascending aorta are normal in size and structure.   9. The interatrial septum was not well visualized.   CHA2DS2-VASc Score =    The patient's score is based upon:   {Click here to calculate score.  REFRESH note before signing. :1}     ASSESSMENT AND PLAN: Paroxysmal Atrial Fibrillation (ICD10:  I48.0) The patient's CHA2DS2-VASc score is  , indicating a  % annual risk of stroke.    -Recent device alert showing 3% AT/AF burden on 09/27/2024 along with 3.2% burden on 09/24/2024.  - We will start Eliquis 5 mg twice daily*** - Patient will hold Eliquis 3 days prior to generator change out scheduled for 10/11/2024  NICM/HFmrEF: -s/p BiV ICD implant due to VF in 2019 currently scheduled for GEN change out by Dr.  Parker     Signed,  Wyn Raddle, Jackee Shove, NP    09/29/2024 10:44 AM     {Are you ordering a CV Procedure (e.g. stress test, cath, DCCV, TEE, etc)?   Press F2        :[id number]}  Follow up with the AF Clinic in ***  "

## 2024-09-30 ENCOUNTER — Encounter (HOSPITAL_COMMUNITY): Payer: Self-pay | Admitting: Nurse Practitioner

## 2024-09-30 ENCOUNTER — Ambulatory Visit (HOSPITAL_COMMUNITY): Admission: RE | Admit: 2024-09-30 | Source: Ambulatory Visit | Admitting: Nurse Practitioner

## 2024-09-30 ENCOUNTER — Ambulatory Visit (HOSPITAL_COMMUNITY)
Admission: RE | Admit: 2024-09-30 | Discharge: 2024-09-30 | Disposition: A | Discharge status: Home / Self Care | Source: Ambulatory Visit | Attending: Nurse Practitioner | Admitting: Nurse Practitioner

## 2024-09-30 VITALS — BP 116/62 | HR 82 | Ht 69.0 in | Wt 200.2 lb

## 2024-09-30 DIAGNOSIS — I428 Other cardiomyopathies: Secondary | ICD-10-CM

## 2024-09-30 DIAGNOSIS — I480 Paroxysmal atrial fibrillation: Secondary | ICD-10-CM | POA: Diagnosis not present

## 2024-09-30 DIAGNOSIS — I10 Essential (primary) hypertension: Secondary | ICD-10-CM

## 2024-09-30 DIAGNOSIS — I5020 Unspecified systolic (congestive) heart failure: Secondary | ICD-10-CM | POA: Diagnosis not present

## 2024-09-30 DIAGNOSIS — I4891 Unspecified atrial fibrillation: Secondary | ICD-10-CM | POA: Diagnosis not present

## 2024-09-30 MED ORDER — APIXABAN 5 MG PO TABS: 5.0000 mg | ORAL_TABLET | Freq: Two times a day (BID) | ORAL | 3 refills | Status: DC

## 2024-09-30 NOTE — Addendum Note (Signed)
 Encounter addended by: Wyn Jackee VEAR Mickey., NP on: 09/30/2024 4:08 PM  Actions taken: Clinical Note Signed

## 2024-09-30 NOTE — Progress Notes (Addendum)
 "  Primary Care Physician: Buren Rock HERO, MD Primary Cardiologist: Deatrice Cage, MD Electrophysiologist: Kennyth Chew, MD   Referring Physician: Kennyth Chew, MD    Benjamin Frost is a 84 y.o. 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  The patient was initially diagnosed with atrial fibrillation recently when a device alert was received showing high ventricular rates and 3% AT/AF burden on 09/27/2024.  He also received alert of 3.2% burden on 09/24/2024.  He reports symptoms of fatigue and dizziness while sitting down. He was previously scheduled for device generator change on 10/11/2024 with recommendations to follow-up today to discuss initiation of oral anticoagulant.  He will undergo device generator change after holding anticoagulant for 3 days.  Benjamin Frost presents today with his daughter for follow-up in the AF clinic.  He reports experiencing episodes of dizziness and shortness of breath that last for a few seconds but denies any prolonged episodes of palpitations.  During today's visit we discussed the pathophysiology of atrial fibrillation and also reviewed medications for managing his heart rate and rhythm.  We also reviewed his stroke risk and need to begin anticoagulants which he is in favor of doing today.  He is scheduled to have a generator change out on January 19 and was provided guidance on holding his blood thinner prior to his procedure. We discussed ED precautions regarding A-fib follows while taking anticoagulant.  He had all other questions answered to his satisfaction today. Today, he denies symptoms of palpitations, chest pain, shortness of breath, orthopnea, PND, lower extremity edema, dizziness, presyncope, syncope, snoring, daytime somnolence, bleeding,  or neurologic sequela. The patient is tolerating medications without difficulties and is otherwise without complaint today.   Atrial Fibrillation Management history: History of Sleep Apnea will discuss at follow-up in 3 months Previous antiarrhythmic drugs: None Previous cardioversions: None Previous ablations: none Anticoagulation history: None  ROS- All systems are reviewed and negative except as per the HPI above.  Past Medical History:  Diagnosis Date   AICD (automatic cardioverter/defibrillator) present    a. St Jude, placed after VF arrest during a dobuatamine echo.    Aortic stenosis, severe    a. 11/04/17: s/p TAVR by Dr. Wonda and Dr. Lucas; b. 10/2018 Echo: 26mm Edwards Sapien bioprosthetic AoV. Mean grad (down from on prior study).   Chronic systolic heart failure (HCC)    a. 2015 EF 10-15%; b. 10/2018 Echo: EF 40-45%, nl RV fxn. Mildly dil LA.   COPD (chronic obstructive pulmonary disease) (HCC)    GERD (gastroesophageal reflux disease)    History of BPH    History of kidney stones    Hyperlipidemia    Hypertension    Left bundle branch block    NICM (nonischemic cardiomyopathy) (HCC)    a. 2015 EF 10-15%; b. 03/2014 cath: nl cors; c. S/p SJM CRT-D; d. 10/2018 Echo: EF 40-45%.   S/P TAVR (transcatheter aortic valve replacement)    a. 10/2017: s/p TAVR with an Edwards Sapien 3 THV (size 26 mm, model # [id number], serial # [id number])   Sinus tachycardia     Current Outpatient Medications  Medication Sig Dispense Refill   albuterol  (VENTOLIN  HFA) 108 (90 Base) MCG/ACT inhaler Inhale 2 puffs into the lungs every 6 (six) hours as needed for wheezing or shortness  of breath.     amoxicillin  (AMOXIL ) 500 MG tablet Take 2,000 mg (four 500 mg tablets) one hour prior to dental appointments. 4 tablet 6   apixaban  (ELIQUIS ) 5 MG TABS tablet Take 1 tablet (5 mg total) by mouth 2 (two) times daily. 60 tablet 3   atorvastatin  (LIPITOR) 40 MG tablet Take 40 mg by mouth daily.      carvedilol  (COREG ) 6.25 MG tablet Take 1 tablet (6.25 mg total) by mouth 2 (two) times daily. 180 tablet 1   cetirizine (ZYRTEC) 10 MG tablet Take 10 mg by mouth daily.     DULERA  200-5 MCG/ACT AERO Inhale 2 puffs into the lungs 2 (two) times daily.     FARXIGA  10 MG TABS tablet Take 1 tablet (10 mg total) by mouth daily before breakfast. 90 tablet 2   fluticasone (FLONASE) 50 MCG/ACT nasal spray Place 1 spray into both nostrils daily. (Patient taking differently: Place 1 spray into both nostrils as needed.)     Fluticasone-Salmeterol (ADVAIR) 250-50 MCG/DOSE AEPB Inhale 1 puff into the lungs 2 (two) times daily. (Patient taking differently: Inhale 1 puff into the lungs every morning.)     furosemide  (LASIX ) 20 MG tablet Take 1 tablet (20 mg total) by mouth daily as needed. For weight gain greater than 2 lb. (Patient taking differently: Take 20 mg by mouth as needed. For weight gain greater than 2 lb.) 30 tablet 3   losartan  (COZAAR ) 25 MG tablet Take 1 tablet (25 mg total) by mouth daily. 30 tablet 3   spironolactone  (ALDACTONE ) 25 MG tablet Take 1 tablet (25 mg total) by mouth daily. 30 tablet 0   tamsulosin  (FLOMAX ) 0.4 MG CAPS capsule Take 0.4 mg by mouth daily.     tiotropium (SPIRIVA ) 18 MCG inhalation capsule Place 18 mcg into inhaler and inhale daily.     No current facility-administered medications for this encounter.    Physical Exam: BP 116/62   Pulse 82   Ht 5' 9 (1.753 m)   Wt 90.8 kg   BMI 29.56 kg/m   GEN: Well nourished, well developed in no acute distress NECK: No JVD; No carotid bruits CARDIAC: Regular rate and rhythm, no murmurs, rubs, gallops RESPIRATORY:  Clear to auscultation without rales, wheezing or rhonchi  ABDOMEN: Soft, non-tender, non-distended EXTREMITIES:  No edema; No deformity   Wt Readings from Last 3 Encounters:  09/30/24 90.8 kg  06/09/24 90.9 kg  05/20/24 88.5 kg     EKG today demonstrates:   EKG Interpretation Date/Time:  Thursday  September 30 2024 14:43:46 EST Ventricular Rate:  82 PR Interval:  152 QRS Duration:  126 QT Interval:  412 QTC Calculation: 481 R Axis:   72  Text Interpretation: Atrial-sensed ventricular-paced rhythm with occasional AV dual-paced complexes and with occasional sinus complexes Biventricular pacemaker detected Abnormal ECG When compared with ECG of 20-May-2024 20:19, Vent. rate has decreased BY   8 BPM Confirmed by Wyn Manus [phone]) on 09/30/2024 2:49:02 PM        Echo Completed 11/03/2018: 1. The left ventricle has mild-moderately reduced systolic function of  40-45%. The cavity size was mildly increased. There is mildly increased  left ventricular wall thickness. Left ventricular diastolic Doppler  parameters are consistent with impaired  relaxation.   2. The right ventricle has normal systolic function. The cavity was  mildly enlarged. There is not assessed. Right ventricular systolic  pressure could not be assessed.   3. Left atrial size was mildly dilated.  4. Right atrial size was mildly dilated.   5. The mitral valve is degenerative. There is mild thickening. There is  moderate mitral annular calcification present.   6. The tricuspid valve is not assessed.   7. A 26mm Edwards Sapien bioprosthetic aortic valve valve is present in  the aortic position. Procedure Date: 11/04/17. Mean gradient is 15 mmHg,  decreased from 21 mmHg on prior study.   8. The aortic root and ascending aorta are normal in size and structure.   9. The interatrial septum was not well visualized.   CHA2DS2-VASc Score =    The patient's score is based upon:       ASSESSMENT AND PLAN: Paroxysmal Atrial Fibrillation (ICD10:  I48.0) The patient's CHA2DS2-VASc score is  , indicating a  % annual risk of stroke.    -Recent device alert showing 3% AT/AF burden on 09/27/2024 along with 3.2% burden on 09/24/2024.  We discussed rate and rhythm control options consisting of Tikosyn and amiodarone  if needed in the  future if AF becomes more persistent or symptomatic. - We will start Eliquis  5 mg twice daily for a CHA2DS2-VASc score of 5 - Patient will hold Eliquis  3 days prior to generator change out scheduled for 10/11/2024 - BMET and CBC in 1 month  NICM/HFmrEF: -s/p BiV ICD implant due to VF in 2019 currently scheduled for GEN change out by Dr. Kennyth on 10/11/24 - Recommendations provided to hold Eliquis  3 days prior to GEN change  HTN:  BP well controlled. Continue current antihypertensive regimen.   Signed,  Wyn Raddle, Jackee Shove, NP    09/30/2024 4:07 PM    Follow up with the AF Clinic in 3 months  "

## 2024-09-30 NOTE — Patient Instructions (Addendum)
 Stop aspirin    Start Eliquis  5mg  twice a day    Hold 3 days prior to your procedure on 1/19 per Dr Kennyth - he will let you know when to resume  Have lab work drawn (orders attached) week of February 9th -- can go to any [facility] to have drawn

## 2024-10-02 ENCOUNTER — Ambulatory Visit: Payer: Self-pay | Admitting: Cardiology

## 2024-10-05 ENCOUNTER — Ambulatory Visit

## 2024-10-05 DIAGNOSIS — Z95810 Presence of automatic (implantable) cardiac defibrillator: Secondary | ICD-10-CM

## 2024-10-05 DIAGNOSIS — I5022 Chronic systolic (congestive) heart failure: Secondary | ICD-10-CM

## 2024-10-05 NOTE — Progress Notes (Signed)
 EPIC Encounter for ICM Monitoring  Patient Name: Benjamin Frost is a 84 y.o. male Date: 10/05/2024 Primary Care Physican: Buren Rock HERO, MD Primary Cardiologist: Darron Electrophysiologist: Kennyth Pore Pacing:  94%           12/03/2023 Weight: 196-197 lbs  01/06/2024 Weight: 195 lbs 01/12/2024 Weight: 195 lbs     01/26/2024 Weight: 195 lbs   02/19/2024 Weight: 195 lbs   04/09/2024 Weight: 195 lbs 05/11/2024 Weight: 195 lbs 07/20/2024 Weight: 191 lbs 08/23/2024 Weight: 190-195 lbs   Battery Longevity: Battery replacement scheduled for 10/11/2024   AT/AF Burden 2.5 % (taking Eliquis )                       Spoke with patient and heart failure questions reviewed.  Transmission results reviewed.  Pt asymptomatic for fluid accumulation.  Reports feeling well at this time and voices no complaints.     Diet:   No updates   Since 08/23/2024 ICM Remote Transmission: [facility] Thoracic impedance suggesting fluid levels returned to normal.   Prescribed:  Furosemide  20 mg take 1 tablet daily as needed for weight gain of 2 lbs or greater (takes on average once a month).    Recommendations:  No changes and encouraged to call if experiencing any fluid symptoms.     Follow-up plan: ICM clinic phone appointment on 11/15/2024 (battery replacement 10/11/2024).  91 day device clinic remote transmission 11/06/2024.    EP/Cardiology Office Visits:   12/30/2024 with Freeman Alberts, NP.   Recall 06/04/2025 with Suzann Riddle NP.   Recall 12/20/2024 with Dr Darron (6 month f/u).   Copy of ICM check sent to Dr. Kennyth  Remote monitoring is medically necessary for Heart Failure Management.    Daily Thoracic Impedance ICM trend: 07/07/2024 through 10/05/2024.    12-14 Month Thoracic Impedance ICM trend:     [name], RN 10/05/2024 12:26 PM

## 2024-10-06 ENCOUNTER — Ambulatory Visit

## 2024-10-07 ENCOUNTER — Encounter

## 2024-10-07 ENCOUNTER — Ambulatory Visit (HOSPITAL_COMMUNITY): Admitting: Nurse Practitioner

## 2024-10-11 ENCOUNTER — Other Ambulatory Visit: Payer: Self-pay

## 2024-10-11 ENCOUNTER — Ambulatory Visit
Admission: RE | Admit: 2024-10-11 | Discharge: 2024-10-11 | Disposition: A | Discharge status: Home / Self Care | Attending: Cardiology | Admitting: Cardiology

## 2024-10-11 ENCOUNTER — Encounter: Payer: Self-pay | Admitting: Cardiology

## 2024-10-11 ENCOUNTER — Encounter
Admission: RE | Disposition: A | Discharge status: Home / Self Care | Payer: Self-pay | Source: Home / Self Care | Attending: Cardiology

## 2024-10-11 DIAGNOSIS — Z87891 Personal history of nicotine dependence: Secondary | ICD-10-CM | POA: Insufficient documentation

## 2024-10-11 DIAGNOSIS — I110 Hypertensive heart disease with heart failure: Secondary | ICD-10-CM | POA: Insufficient documentation

## 2024-10-11 DIAGNOSIS — Z953 Presence of xenogenic heart valve: Secondary | ICD-10-CM | POA: Diagnosis not present

## 2024-10-11 DIAGNOSIS — I350 Nonrheumatic aortic (valve) stenosis: Secondary | ICD-10-CM | POA: Insufficient documentation

## 2024-10-11 DIAGNOSIS — I428 Other cardiomyopathies: Secondary | ICD-10-CM | POA: Diagnosis not present

## 2024-10-11 DIAGNOSIS — Z4502 Encounter for adjustment and management of automatic implantable cardiac defibrillator: Secondary | ICD-10-CM | POA: Insufficient documentation

## 2024-10-11 DIAGNOSIS — I5022 Chronic systolic (congestive) heart failure: Secondary | ICD-10-CM | POA: Diagnosis not present

## 2024-10-11 HISTORY — PX: BIV ICD GENERATOR CHANGEOUT: EP1194

## 2024-10-11 MED ORDER — HEPARIN (PORCINE) IN NACL 1000-0.9 UT/500ML-% IV SOLN
INTRAVENOUS | Status: DC | PRN
  Administered 2024-10-11: 500 mL

## 2024-10-11 MED ORDER — LIDOCAINE HCL 1 % IJ SOLN
INTRAMUSCULAR | Status: AC
  Filled 2024-10-11: qty 40

## 2024-10-11 MED ORDER — CHLORHEXIDINE GLUCONATE 4 % EX SOLN: 4.0000 | Freq: Once | CUTANEOUS | Status: DC

## 2024-10-11 MED ORDER — SODIUM CHLORIDE 0.9 % IV SOLN
80.0000 mg | INTRAVENOUS | Status: DC
  Filled 2024-10-11: qty 2

## 2024-10-11 MED ORDER — CEFAZOLIN SODIUM-DEXTROSE 2-4 GM/100ML-% IV SOLN
INTRAVENOUS | Status: AC
  Filled 2024-10-11: qty 100

## 2024-10-11 MED ORDER — POVIDONE-IODINE 10 % EX SWAB
2.0000 | Freq: Once | CUTANEOUS | Status: AC
  Administered 2024-10-11: 2 via TOPICAL

## 2024-10-11 MED ORDER — SODIUM CHLORIDE 0.9 % IV SOLN
INTRAVENOUS | Status: DC | PRN
  Administered 2024-10-11: 80 mg

## 2024-10-11 MED ORDER — ONDANSETRON HCL 4 MG/2ML IJ SOLN: 4.0000 mg | Freq: Four times a day (QID) | INTRAMUSCULAR | Status: DC | PRN

## 2024-10-11 MED ORDER — LIDOCAINE HCL 1 % IJ SOLN
INTRAMUSCULAR | Status: AC
  Filled 2024-10-11: qty 20

## 2024-10-11 MED ORDER — CEFAZOLIN SODIUM-DEXTROSE 2-4 GM/100ML-% IV SOLN: 2.0000 g | INTRAVENOUS | Status: DC

## 2024-10-11 MED ORDER — CEFAZOLIN (ANCEF) 1 G IV SOLR
INTRAVENOUS | Status: DC | PRN
  Administered 2024-10-11: 2 g

## 2024-10-11 MED ORDER — ACETAMINOPHEN 325 MG PO TABS: 325.0000 mg | ORAL_TABLET | ORAL | Status: DC | PRN

## 2024-10-11 MED ORDER — APIXABAN 5 MG PO TABS: 5.0000 mg | ORAL_TABLET | Freq: Two times a day (BID) | ORAL | 3 refills | Status: AC

## 2024-10-11 MED ORDER — MIDAZOLAM HCL 2 MG/2ML IJ SOLN
INTRAMUSCULAR | Status: AC
  Filled 2024-10-11: qty 2

## 2024-10-11 MED ORDER — FENTANYL CITRATE (PF) 100 MCG/2ML IJ SOLN
INTRAMUSCULAR | Status: DC | PRN
  Administered 2024-10-11: 50 ug via INTRAVENOUS

## 2024-10-11 MED ORDER — LIDOCAINE HCL (PF) 1 % IJ SOLN
INTRAMUSCULAR | Status: DC | PRN
  Administered 2024-10-11: 50 mL

## 2024-10-11 MED ORDER — SODIUM CHLORIDE 0.9 % IV SOLN: INTRAVENOUS | Status: DC

## 2024-10-11 MED ORDER — MIDAZOLAM HCL (PF) 2 MG/2ML IJ SOLN
INTRAMUSCULAR | Status: DC | PRN
  Administered 2024-10-11: 1 mg via INTRAVENOUS

## 2024-10-11 MED ORDER — FENTANYL CITRATE (PF) 100 MCG/2ML IJ SOLN
INTRAMUSCULAR | Status: AC
  Filled 2024-10-11: qty 2

## 2024-10-11 NOTE — Discharge Instructions (Signed)
 After Your Pacemaker   You have a Abbott Pacemaker  If you have a Medtronic or Biotronik device, plug in your home monitor once you get home, and no manual interaction is required.   If you have an Abbott or Autozone device, plug your home monitor once you get home, sit near the device, and press the large activation button. Sit nearby until the process is complete, usually notated by lights on the monitor.   If you were set up for monitoring using an app on your phone, make sure the app remains open in the background and the Bluetooth remains on.  ACTIVITY Do not lift your arm above shoulder height for 1 week after your procedure. After 7 days, you may progress as below.  You should remove your sling 24 hours after your procedure, unless otherwise instructed by your provider.     Monday October 18, 2024  Tuesday October 19, 2024 Wednesday October 20, 2024 Thursday October 21, 2024   Do not lift, push, pull, or carry anything over 10 pounds with the affected arm until 6 weeks (Monday November 22, 2024 ) after your procedure.   You may drive AFTER your wound check, unless you have been told otherwise by your provider.   Ask your healthcare provider when you can go back to work   INCISION/Dressing Resume Eliquis  on the evening of 1/23.   If large square, outer bandage is left in place, this can be removed after 24 hours from your procedure. Do not remove steri-strips or glue as below.   If a PRESSURE DRESSING (a bulky dressing that usually goes up over your shoulder) was applied or left in place, please follow instructions given by your provider on when to return to have this removed.   Monitor your Pacemaker site for redness, swelling, and drainage. Call the device clinic at [phone] if you experience these symptoms or fever/chills.  If your incision is sealed with Steri-strips or staples, you may shower 7 days after your procedure or when told by your provider. Do not  remove the steri-strips or let the shower hit directly on your site. You may wash around your site with soap and water.    If you were discharged in a sling, please do not wear this during the day more than 48 hours after your surgery unless otherwise instructed. This may increase the risk of stiffness and soreness in your shoulder.   Avoid lotions, ointments, or perfumes over your incision until it is well-healed.  You may use a hot tub or a pool AFTER your wound check appointment if the incision is completely closed.  Pacemaker Alerts:  Some alerts are vibratory and others beep. These are NOT emergencies. Please call our office to let us  know. If this occurs at night or on weekends, it can wait until the next business day. Send a remote transmission.  If your device is capable of reading fluid status (for heart failure), you will be offered monthly monitoring to review this with you.   DEVICE MANAGEMENT Remote monitoring is used to monitor your pacemaker from home. This monitoring is scheduled every 91 days by our office. It allows us  to keep an eye on the functioning of your device to ensure it is working properly. You will routinely see your Electrophysiologist annually (more often if necessary).  This will appear as a REMOTE check on your MyChart schedule. These are automatic and there is nothing for you to manually do unless otherwise instructed.  You should receive your ID card for your new device in 4-8 weeks. Keep this card with you at all times once received. Consider wearing a medical alert bracelet or necklace.  Your Pacemaker may be MRI compatible. This will be discussed at your next office visit/wound check.  You should avoid contact with strong electric or magnetic fields.   Do not use amateur (ham) radio equipment or electric (arc) welding torches. MP3 player headphones with magnets should not be used. Some devices are safe to use if held at least 12 inches (30 cm) from your  Pacemaker. These include power tools, lawn mowers, and speakers. If you are unsure if something is safe to use, ask your health care provider.  When using your cell phone, hold it to the ear that is on the opposite side from the Pacemaker. Do not leave your cell phone in a pocket over the Pacemaker.  You may safely use electric blankets, heating pads, computers, and microwave ovens.  Call the office right away if: You have chest pain. You feel more short of breath than you have felt before. You feel more light-headed than you have felt before. Your incision starts to open up.  This information is not intended to replace advice given to you by your health care provider. Make sure you discuss any questions you have with your health care provider.

## 2024-10-11 NOTE — H&P (Signed)
 "    Patient ID: Benjamin Frost MRN: [id number]; DOB: January 12, 1941  Admit date: 10/11/2024 Date of Consult: 10/11/2024  PCP:  Buren Rock HERO, MD   [facility] Providers Cardiologist:  Deatrice Cage, MD  Electrophysiologist:  OLE ONEIDA HOLTS, MD (Inactive)       HPI: Benjamin Frost is a 84 y.o. male with a hx of chronic systolic heart failure s/p CRT-D who presents today for planned generator change. He reports feeling well. Has chronic, intermittent phrenic stim. [employer] rep adjusted settings in preOp and phrenic stim went away. Reports feeling well otherwise. No new or acute complaints.   Past Medical History:  Diagnosis Date   AICD (automatic cardioverter/defibrillator) present    a. St Jude, placed after VF arrest during a dobuatamine echo.    Aortic stenosis, severe    a. 11/04/17: s/p TAVR by Dr. Wonda and Dr. Lucas; b. 10/2018 Echo: 26mm Edwards Sapien bioprosthetic AoV. Mean grad (down from on prior study).   Chronic systolic heart failure (HCC)    a. 2015 EF 10-15%; b. 10/2018 Echo: EF 40-45%, nl RV fxn. Mildly dil LA.   COPD (chronic obstructive pulmonary disease) (HCC)    GERD (gastroesophageal reflux disease)    History of BPH    History of kidney stones    Hyperlipidemia    Hypertension    Left bundle branch block    NICM (nonischemic cardiomyopathy) (HCC)    a. 2015 EF 10-15%; b. 03/2014 cath: nl cors; c. S/p SJM CRT-D; d. 10/2018 Echo: EF 40-45%.   S/P TAVR (transcatheter aortic valve replacement)    a. 10/2017: s/p TAVR with an Edwards Sapien 3 THV (size 26 mm, model # 9600TFX, serial # [id number])   Sinus tachycardia     Past Surgical History:  Procedure Laterality Date   BIV ICD INSERTION CRT-D N/A 05/29/2017   SJM Quadra Assura implanted by Dr Kelsie for secondary prevention of sudden death   CARDIAC CATHETERIZATION  2012   [facility]   CARDIAC CATHETERIZATION     [facility]   CATARACT EXTRACTION, BILATERAL     LEFT AND RIGHT HEART CATHETERIZATION  WITH CORONARY ANGIOGRAM N/A 03/23/2014   Procedure: LEFT AND RIGHT HEART CATHETERIZATION WITH CORONARY ANGIOGRAM;  Surgeon: Deatrice DELENA Cage, MD;  Location: [facility] CATH LAB;  Service: Cardiovascular;  Laterality: N/A;   LEG SURGERY Left    surgery d/t injury from nail gun   RIGHT/LEFT HEART CATH AND CORONARY ANGIOGRAPHY N/A 05/16/2017   Procedure: RIGHT/LEFT HEART CATH AND CORONARY ANGIOGRAPHY;  Surgeon: Mady Bruckner, MD;  Location: [facility] INVASIVE CV LAB;  Service: Cardiovascular;  Laterality: N/A;   TEE WITHOUT CARDIOVERSION N/A 11/04/2017   Procedure: TRANSESOPHAGEAL ECHOCARDIOGRAM (TEE);  Surgeon: Wonda Sharper, MD;  Location: [facility] OR;  Service: Open Heart Surgery;  Laterality: N/A;   TRANSCATHETER AORTIC VALVE REPLACEMENT, TRANSFEMORAL N/A 11/04/2017   Procedure: TRANSCATHETER AORTIC VALVE REPLACEMENT, TRANSFEMORAL;  Surgeon: Wonda Sharper, MD;  Location: [facility] OR;  Service: Open Heart Surgery;  Laterality: N/A;    Scheduled Meds:  chlorhexidine   4 Application Topical Once   gentamicin  (GARAMYCIN ) 80 mg in sodium chloride  0.9 % 500 mL irrigation  80 mg Irrigation On Call   Continuous Infusions:  sodium chloride  50 mL/hr at 10/11/24 1230    ceFAZolin  (ANCEF ) IV      Allergies:   Allergies[1]  Social History:   Social History   Socioeconomic History   Marital status: Married    Spouse name: Rock   Number of children:  4   Years of education: Not on file   Highest education level: Not on file  Occupational History   Not on file  Tobacco Use   Smoking status: Former    Current packs/day: 0.00    Types: Cigarettes    Start date: 55    Quit date: 74    Years since quitting: 34.0   Smokeless tobacco: Former    Types: Snuff   Tobacco comments:    quit 25 years ago  Vaping Use   Vaping status: Never Used  Substance and Sexual Activity   Alcohol use: Yes    Comment: occasional Beer : [employer] 2x/week during the winter   Drug use: No   Sexual activity: Not on file  Other  Topics Concern   Not on file  Social History Narrative   Lives with wife Rock at home: Daughter lives next door. Son lives behind him    Social Drivers of Health   Tobacco Use: Medium Risk (10/11/2024)   Patient History    Smoking Tobacco Use: Former    Smokeless Tobacco Use: Former    Passive Exposure: Not on Stage Manager: Not on [employer] Insecurity: Not on file  Transportation Needs: Not on file  Physical Activity: Not on file  Stress: Not on file  Social Connections: Not on file  Intimate Partner Violence: Not on file  Depression (PHQ2-9): Not on file  Alcohol Screen: Not on file  Housing: Not on file  Utilities: Not on file  Health Literacy: Not on file    Family History:   Family History  Problem Relation Age of Onset   Heart disease Father    Heart failure Father    Hypertension Mother    Hyperlipidemia Mother      ROS:  Please see the history of present illness.   All other ROS reviewed and negative.     Physical Exam/Data: Vitals:   10/11/24 1228 10/11/24 1235  BP: 123/80   Pulse: 75 74  Resp: 15 12  Temp: 98.2 F (36.8 C)   TempSrc: Oral   SpO2: 95% 96%  Weight: 86.9 kg   Height: 5' 9 (1.753 m)    No intake or output data in the 24 hours ending 10/11/24 1252    10/11/2024   12:28 PM 09/30/2024    2:31 PM 06/09/2024   10:05 AM  Last 3 Weights  Weight (lbs) 191 lb 9.6 oz 200 lb 3.2 oz 200 lb 6 oz  Weight (kg) 86.909 kg 90.81 kg 90.89 kg     Body mass index is 28.29 kg/m.   General: Well developed, in no acute distress.  Neck: No JVD.  Cardiac: Normal rate, regular rhythm.  Resp: Normal work of breathing.  Ext: No edema.  Neuro: No gross focal deficits.  Psych: Normal affect.   Relevant CV Studies: Echo 11/03/18:  1. The left ventricle has mild-moderately reduced systolic function of  40-45%. The cavity size was mildly increased. There is mildly increased  left ventricular wall thickness. Left ventricular diastolic  Doppler  parameters are consistent with impaired  relaxation.   2. The right ventricle has normal systolic function. The cavity was  mildly enlarged. There is not assessed. Right ventricular systolic  pressure could not be assessed.   3. Left atrial size was mildly dilated.   4. Right atrial size was mildly dilated.   5. The mitral valve is degenerative. There is mild thickening. There is  moderate  mitral annular calcification present.   6. The tricuspid valve is not assessed.   7. A 26mm Edwards Sapien bioprosthetic aortic valve valve is present in  the aortic position. Procedure Date: 11/04/17. Mean gradient is 15 mmHg,  decreased from 21 mmHg on prior study.   8. The aortic root and ascending aorta are normal in size and structure.   9. The interatrial septum was not well visualized.   Laboratory Data: High Sensitivity Troponin:  No results for input(s): TROPONINIHS in the last 720 hours. No results for input(s): TRNPT in the last 720 hours.    ChemistryNo results for input(s): NA, K, CL, CO2, GLUCOSE, BUN, CREATININE, CALCIUM , MG, GFRNONAA, GFRAA, ANIONGAP in the last 168 hours.  No results for input(s): PROT, ALBUMIN , AST, ALT, ALKPHOS, BILITOT in the last 168 hours. Lipids No results for input(s): CHOL, TRIG, HDL, LABVLDL, LDLCALC, CHOLHDL in the last 168 hours.  HematologyNo results for input(s): WBC, RBC, HGB, HCT, MCV, MCH, MCHC, RDW, PLT in the last 168 hours. Thyroid No results for input(s): TSH, FREET4 in the last 168 hours.  BNPNo results for input(s): BNP, PROBNP in the last 168 hours.  DDimer No results for input(s): DDIMER in the last 168 hours.  Radiology/Studies:  No results found.   Assessment: 1. Chronic systolic heart failure (HCC)   2. Cardiac resynchronization therapy defibrillator (CRT-D) in place   3. Aortic stenosis, severe   4. S/P TAVR (transcatheter aortic valve replacement)      PLAN:    #Chronic systolic heart failure #CRT-D in situ #LV lead with high pacing threshold and phrenic capture Lead management strategies previously discussed with Dr. Cindie: At this time, I do not think the risks of lead extraction and replacement warrant any potential benefit.  The LV capture threshold is stable but elevated.  His phrenic stimulation is intermittent and positional.  I have recommended generator replacement when his device reaches ERI. Phrenic stim resolved with reprogramming by [employer] rep in preOp.  Risks, benefits, and alternatives to ICD pulse generator replacement were discussed again today.  The patient understands that risks include but are not limited to bleeding, infection, pneumothorax, perforation, tamponade, vascular damage, renal failure, MI, stroke, death, inappropriate shocks, damage to his existing leads, and lead dislodgement and wishes to proceed.     #Severe aortic stenosis Doing well after TAVR.  Appears well compensated.  Signed, Fonda Kitty, MD  10/11/2024 12:52 PM     [1]  Allergies Allergen Reactions   Dobutamine     VF arrest during dobutamine echo   Lisinopril Hives and Other (See Comments)      Hives   Amlodipine Other (See Comments)    UNSPECIFIED REACTION  unknown   Metoprolol  Nausea And Vomiting and Other (See Comments)      Nausea and vomiting    "

## 2024-10-11 NOTE — OR Nursing (Signed)
 Dr Kennyth reviewed new instructions with patient and family. No shower water on wound for one week. No driving for three days. No limit on range of motion with arm.

## 2024-10-12 ENCOUNTER — Encounter: Payer: Self-pay | Admitting: Cardiology

## 2024-10-20 NOTE — Progress Notes (Unsigned)
 Wound check appointment for recent Generator change  Steri-strips removed. Wound without redness or edema. Incision edges approximated, wound well healed. Normal device function. Thresholds, sensing, and impedances consistent with implant measurements. LV threshold chronically elevated. Histogram distribution appropriate for patient and level of activity. No mode switches or high ventricular rates noted. Patient educated about wound care, arm mobility, lifting restrictions. ROV in 3 months with APP.

## 2024-10-21 ENCOUNTER — Ambulatory Visit: Attending: Cardiology | Admitting: Cardiology

## 2024-10-21 ENCOUNTER — Ambulatory Visit

## 2024-10-21 DIAGNOSIS — I428 Other cardiomyopathies: Secondary | ICD-10-CM | POA: Diagnosis not present

## 2024-10-21 DIAGNOSIS — Z95810 Presence of automatic (implantable) cardiac defibrillator: Secondary | ICD-10-CM | POA: Diagnosis not present

## 2024-10-21 LAB — CUP PACEART INCLINIC DEVICE CHECK
Battery Remaining Longevity: 43 mo
Brady Statistic RA Percent Paced: 0.86 %
Brady Statistic RV Percent Paced: 97 %
Date Time Interrogation Session: 20260129121003
HighPow Impedance: 65.25 Ohm
Implantable Lead Connection Status: 753985
Implantable Lead Connection Status: 753985
Implantable Lead Connection Status: 753985
Implantable Lead Implant Date: 20180906
Implantable Lead Implant Date: 20180906
Implantable Lead Implant Date: 20180906
Implantable Lead Location: 753858
Implantable Lead Location: 753859
Implantable Lead Location: 753860
Implantable Pulse Generator Implant Date: 20260119
Lead Channel Impedance Value: 500 Ohm
Lead Channel Impedance Value: 562.5 Ohm
Lead Channel Impedance Value: 600 Ohm
Lead Channel Pacing Threshold Amplitude: 0.5 V
Lead Channel Pacing Threshold Amplitude: 0.5 V
Lead Channel Pacing Threshold Amplitude: 0.75 V
Lead Channel Pacing Threshold Amplitude: 0.75 V
Lead Channel Pacing Threshold Amplitude: 3.75 V
Lead Channel Pacing Threshold Amplitude: 3.75 V
Lead Channel Pacing Threshold Pulse Width: 0.3 ms
Lead Channel Pacing Threshold Pulse Width: 0.3 ms
Lead Channel Pacing Threshold Pulse Width: 0.3 ms
Lead Channel Pacing Threshold Pulse Width: 0.3 ms
Lead Channel Pacing Threshold Pulse Width: 1.5 ms
Lead Channel Pacing Threshold Pulse Width: 1.5 ms
Lead Channel Sensing Intrinsic Amplitude: 3.5 mV
Lead Channel Sensing Intrinsic Amplitude: 6.3 mV
Lead Channel Setting Pacing Amplitude: 1.5 V
Lead Channel Setting Pacing Amplitude: 2 V
Lead Channel Setting Pacing Amplitude: 4.5 V
Lead Channel Setting Pacing Pulse Width: 0.3 ms
Lead Channel Setting Pacing Pulse Width: 1.5 ms
Lead Channel Setting Sensing Sensitivity: 0.5 mV
Pulse Gen Serial Number: 211060733

## 2024-10-21 NOTE — Patient Instructions (Addendum)
" ° ° °  OK to shower Let warm soapy water run down incision Do not scrub incision Pat dry with towel  Keep incision open to air - no ointments, creams, salves, or bandages.  Call device clinic if incision opens, has drainage, or begins to hurt more.      Medication Instructions:  Your physician recommends that you continue on your current medications as directed. Please refer to the Current Medication list given to you today.  *If you need a refill on your cardiac medications before your next appointment, please call your pharmacy*  Lab Work: No labs ordered today    Testing/Procedures: No test ordered today   Follow-Up: At [facility], you and your health needs are our priority.  As part of our continuing mission to provide you with exceptional heart care, our providers are all part of one team.  This team includes your primary Cardiologist (physician) and Advanced Practice Providers or APPs (Physician Assistants and Nurse Practitioners) who all work together to provide you with the care you need, when you need it.  Your next appointment:    Scheduled for 01/11/2025   Provider:   Suzann Riddle, NP       "

## 2024-10-21 NOTE — Progress Notes (Signed)
 31 day ICM Remote transmission canceled due to [facility] clinic is on hold until further notice.  91 day remote monitoring will continue per protocol.

## 2024-10-24 ENCOUNTER — Ambulatory Visit: Payer: Self-pay | Admitting: Cardiology

## 2024-10-27 ENCOUNTER — Telehealth: Payer: Self-pay | Admitting: Cardiovascular Disease

## 2024-10-27 NOTE — Telephone Encounter (Signed)
 Called pt's daughter, Niels.  She verified pt's identity using 2 identifiers.  She states that pt has nasal drainage and states that he is complaining of pressure.  She would like to know what OTC medication she can get for him with his cardiac medications.  I reasured her that I would send a message to our PharmD group and get their professional recommendations and will call her back.  I also offered that she could talk with her pharmacist, but she stated she would rather have recommendations from our office.

## 2024-10-27 NOTE — Telephone Encounter (Signed)
 Pts daughter calling to see what kind of over the counter med is okay to give pt as he has a cold/ sinus pressure. Please advise.

## 2024-10-29 NOTE — Telephone Encounter (Signed)
 Would recommend he use steroid nasal spray (Flonase, Nasacort, Rhinocort) as well as an antihistamine such as claritin or zyrtec.   He should avoid decongestants and Afrin nasal spry.

## 2024-10-29 NOTE — Telephone Encounter (Signed)
 Called and spoke with pt's daughter who identified pt using 2 identifiers;  Pharm D  recommendations for OTC for pt's congestion is for steriod nasal spray such as Flonase, Nasacort Rhinocort as well as an antihistamine such as claritin or zyrtec.  Explained that pt should avoid decongestants and Afrin Nasal spray.  Pt's daughter stated that she had both Flonase and Zyrtec on hand and thanked me for calling her back with the recommendations.

## 2024-11-06 ENCOUNTER — Ambulatory Visit

## 2024-11-08 ENCOUNTER — Encounter

## 2024-11-15 ENCOUNTER — Ambulatory Visit

## 2024-11-22 ENCOUNTER — Ambulatory Visit

## 2024-12-09 ENCOUNTER — Encounter

## 2024-12-30 ENCOUNTER — Ambulatory Visit (HOSPITAL_COMMUNITY): Admitting: Nurse Practitioner

## 2025-01-10 ENCOUNTER — Encounter

## 2025-01-11 ENCOUNTER — Ambulatory Visit: Admitting: Cardiology

## 2025-01-11 ENCOUNTER — Ambulatory Visit: Admitting: Cardiovascular Disease

## 2025-02-21 ENCOUNTER — Ambulatory Visit

## 2025-05-23 ENCOUNTER — Ambulatory Visit

## 2025-08-22 ENCOUNTER — Ambulatory Visit

## 2025-11-21 ENCOUNTER — Ambulatory Visit
